# Patient Record
Sex: Male | Born: 1935 | Race: White | Hispanic: No | Smoking: Former smoker
Health system: Southern US, Community
[De-identification: ages and names within clinical notes are randomized; demographics above are authoritative.]

## PROBLEM LIST (undated history)

## (undated) DIAGNOSIS — I714 Abdominal aortic aneurysm, without rupture, unspecified: Secondary | ICD-10-CM

## (undated) DIAGNOSIS — E039 Hypothyroidism, unspecified: Secondary | ICD-10-CM

## (undated) DIAGNOSIS — G4730 Sleep apnea, unspecified: Secondary | ICD-10-CM

## (undated) DIAGNOSIS — J449 Chronic obstructive pulmonary disease, unspecified: Secondary | ICD-10-CM

## (undated) DIAGNOSIS — I10 Essential (primary) hypertension: Secondary | ICD-10-CM

## (undated) DIAGNOSIS — I639 Cerebral infarction, unspecified: Secondary | ICD-10-CM

## (undated) DIAGNOSIS — R51 Headache: Secondary | ICD-10-CM

## (undated) DIAGNOSIS — R0602 Shortness of breath: Secondary | ICD-10-CM

## (undated) DIAGNOSIS — R519 Headache, unspecified: Secondary | ICD-10-CM

## (undated) DIAGNOSIS — I2510 Atherosclerotic heart disease of native coronary artery without angina pectoris: Secondary | ICD-10-CM

## (undated) DIAGNOSIS — Z950 Presence of cardiac pacemaker: Secondary | ICD-10-CM

## (undated) DIAGNOSIS — M1990 Unspecified osteoarthritis, unspecified site: Secondary | ICD-10-CM

## (undated) DIAGNOSIS — N189 Chronic kidney disease, unspecified: Secondary | ICD-10-CM

## (undated) DIAGNOSIS — I219 Acute myocardial infarction, unspecified: Secondary | ICD-10-CM

## (undated) HISTORY — DX: Cerebral infarction, unspecified: I63.9

## (undated) HISTORY — DX: Abdominal aortic aneurysm, without rupture, unspecified: I71.40

## (undated) HISTORY — DX: Chronic obstructive pulmonary disease, unspecified: J44.9

## (undated) HISTORY — DX: Abdominal aortic aneurysm, without rupture: I71.4

## (undated) HISTORY — PX: CATARACT EXTRACTION: SUR2

---

## 1961-12-21 HISTORY — PX: APPENDECTOMY: SHX54

## 2003-10-22 ENCOUNTER — Encounter: Admission: RE | Admit: 2003-10-22 | Discharge: 2003-10-22 | Payer: Self-pay | Admitting: *Deleted

## 2005-09-05 ENCOUNTER — Encounter: Admission: RE | Admit: 2005-09-05 | Discharge: 2005-09-05 | Payer: Self-pay | Admitting: Orthopedic Surgery

## 2005-12-21 HISTORY — PX: CORONARY ARTERY BYPASS GRAFT: SHX141

## 2006-03-09 ENCOUNTER — Ambulatory Visit (HOSPITAL_COMMUNITY): Admission: RE | Admit: 2006-03-09 | Discharge: 2006-03-09 | Payer: Self-pay | Admitting: *Deleted

## 2006-10-21 ENCOUNTER — Inpatient Hospital Stay (HOSPITAL_COMMUNITY): Admission: AD | Admit: 2006-10-21 | Discharge: 2006-10-31 | Payer: Self-pay | Admitting: Cardiology

## 2006-10-21 ENCOUNTER — Encounter: Payer: Self-pay | Admitting: Emergency Medicine

## 2006-10-21 ENCOUNTER — Ambulatory Visit: Payer: Self-pay | Admitting: Cardiology

## 2006-11-05 ENCOUNTER — Encounter: Admission: RE | Admit: 2006-11-05 | Discharge: 2006-11-05 | Payer: Self-pay | Admitting: Cardiothoracic Surgery

## 2006-11-24 ENCOUNTER — Ambulatory Visit: Payer: Self-pay | Admitting: Cardiology

## 2006-11-25 ENCOUNTER — Encounter: Payer: Self-pay | Admitting: Cardiology

## 2006-11-25 ENCOUNTER — Ambulatory Visit: Payer: Self-pay

## 2006-12-03 ENCOUNTER — Ambulatory Visit: Payer: Self-pay | Admitting: Cardiology

## 2006-12-09 ENCOUNTER — Encounter (HOSPITAL_COMMUNITY): Admission: RE | Admit: 2006-12-09 | Discharge: 2007-03-09 | Payer: Self-pay | Admitting: Cardiology

## 2006-12-10 ENCOUNTER — Ambulatory Visit: Payer: Self-pay | Admitting: Cardiology

## 2007-01-05 ENCOUNTER — Ambulatory Visit: Payer: Self-pay | Admitting: Cardiology

## 2007-01-05 LAB — CONVERTED CEMR LAB
ALT: 18 units/L (ref 0–40)
AST: 23 units/L (ref 0–37)
Albumin: 3.7 g/dL (ref 3.5–5.2)
Alkaline Phosphatase: 37 units/L — ABNORMAL LOW (ref 39–117)
BUN: 29 mg/dL — ABNORMAL HIGH (ref 6–23)
CO2: 29 meq/L (ref 19–32)
Calcium: 9.4 mg/dL (ref 8.4–10.5)
Chloride: 105 meq/L (ref 96–112)
Creatinine, Ser: 2.2 mg/dL — ABNORMAL HIGH (ref 0.4–1.5)
GFR calc Af Amer: 38 mL/min
GFR calc non Af Amer: 32 mL/min
Glucose, Bld: 89 mg/dL (ref 70–99)
Potassium: 4.7 meq/L (ref 3.5–5.1)
Sodium: 141 meq/L (ref 135–145)
Total Bilirubin: 0.8 mg/dL (ref 0.3–1.2)
Total Protein: 6.2 g/dL (ref 6.0–8.3)

## 2007-01-27 ENCOUNTER — Ambulatory Visit: Payer: Self-pay

## 2007-02-01 ENCOUNTER — Encounter: Admission: RE | Admit: 2007-02-01 | Discharge: 2007-02-01 | Payer: Self-pay | Admitting: Nephrology

## 2007-02-18 ENCOUNTER — Ambulatory Visit: Payer: Self-pay | Admitting: Cardiology

## 2007-02-18 LAB — CONVERTED CEMR LAB
BUN: 21 mg/dL (ref 6–23)
CO2: 29 meq/L (ref 19–32)
Calcium: 9.4 mg/dL (ref 8.4–10.5)
Chloride: 103 meq/L (ref 96–112)
Creatinine, Ser: 1.7 mg/dL — ABNORMAL HIGH (ref 0.4–1.5)
GFR calc Af Amer: 51 mL/min
GFR calc non Af Amer: 43 mL/min
Glucose, Bld: 94 mg/dL (ref 70–99)
Potassium: 4.4 meq/L (ref 3.5–5.1)
Sodium: 137 meq/L (ref 135–145)
TSH: 5.23 microintl units/mL (ref 0.35–5.50)

## 2007-02-23 ENCOUNTER — Ambulatory Visit: Payer: Self-pay

## 2007-02-23 LAB — CONVERTED CEMR LAB
BUN: 22 mg/dL (ref 6–23)
CO2: 30 meq/L (ref 19–32)
Calcium: 9.1 mg/dL (ref 8.4–10.5)
Chloride: 106 meq/L (ref 96–112)
Creatinine, Ser: 1.8 mg/dL — ABNORMAL HIGH (ref 0.4–1.5)
GFR calc Af Amer: 48 mL/min
GFR calc non Af Amer: 40 mL/min
Glucose, Bld: 90 mg/dL (ref 70–99)
Potassium: 4.3 meq/L (ref 3.5–5.1)
Sodium: 142 meq/L (ref 135–145)

## 2007-03-08 ENCOUNTER — Ambulatory Visit: Payer: Self-pay | Admitting: Cardiology

## 2007-07-11 ENCOUNTER — Encounter: Admission: RE | Admit: 2007-07-11 | Discharge: 2007-07-11 | Payer: Self-pay | Admitting: Endocrinology

## 2007-08-24 ENCOUNTER — Ambulatory Visit: Payer: Self-pay | Admitting: Cardiology

## 2007-09-20 ENCOUNTER — Ambulatory Visit: Payer: Self-pay | Admitting: Cardiology

## 2008-04-30 ENCOUNTER — Ambulatory Visit: Payer: Self-pay | Admitting: Cardiology

## 2011-05-05 NOTE — Assessment & Plan Note (Signed)
[facility]                            CARDIOLOGY OFFICE NOTE   JADEIN, THALMAN                       MRN:          [id number]  DATE:04/30/2008                            DOB:          [birth year]    Mr. Cicio is in for followup.  Generally, he is quite stable.  He has  not been having any ongoing chest pain or shortness of breath.  He feels  well.  He has exercised on a regular basis, although he has had problem  with his knee from over-exercising.  He is followed up with Dr. Wilson Singer,  and Dr. Wilson Singer has been checking his laboratory studies.  He is also  seeing Dr. Janice Norrie and has previously seen Dr. Florene Glen for renal  insufficiency.  Importantly, associated was hypertension, he has had a  renal ultrasound done here, and that demonstrated no evidence of  significant renal artery stenosis.   CURRENT MEDICATIONS:  1. Include aspirin 81 mg daily.  2. Toprol XL 50 mg 1/2 tablet daily.  3. Tricor 40 mg daily.  4. Synthroid 75 mg daily.  5. Hytrin 10 mg twice daily.  6. Crestor 20 mg daily.  7. Exforge 5/160 one daily.  8. B12 1000 mg monthly.  9. Omacor 4 daily.   PHYSICAL:  He is alert and oriented.  No distress.  Blood pressure is 140/90, pulse 49.  Lung fields clear.  CARDIAC:  Rhythm is regular.   EKG reveals marked sinus bradycardia with borderline first-degree AV  block, PR intervals 234 milliseconds.   Overall, the patient remained stable.  He has moderate hypertension that  is of borderline control, although this would be improved pretty  substantially with a significant amount of weight loss.  He has  hypercholesterolemia, and has known renal insufficiency Dr. Wilson Singer is  following both of these issues.   With regard to the stability, because he is doing well without chest  pain or significant problems, we will plan to see him back again in  followup in about 2 years.  Should any problems in the interim he is to  contact us.  I think he  can have a followup EKG done at Dr. Eugenio Hoes  office in 1 year to check on his PR interval.  His beta blockers could  be dropped although he is on very  low-dose beta blockade, and his blood pressure is borderline.  Should  the patient have problems in the interim, he is to call us.     Loretha Brasil. Lia Foyer, MD, [facility]  Electronically Signed    TDS/MedQ  DD: 04/30/2008  DT: 04/30/2008  Job #: [id number]

## 2011-05-05 NOTE — Procedures (Signed)
[facility]                              EXERCISE TREADMILL   NAME:[name], Derek A                       MRN:          [id number]  DATE:09/20/2007                            DOB:          1935/07/03    PROCEDURE:  Exercise tolerance test.   Duration of exercise 4 minutes 23 seconds.  Maximum heart rate 129.  Percent of MHR is 86%.   COMMENTS:  [name] exercise today on the Bruce protocol.  Exercise  tolerance was fairly limited.  His exercise capacity was not as good as  it potentially should be at this point.  He had a modest heart rate rise  with occasional premature ventricular contractions.  Resting  electrocardiogram demonstrates normal sinus rhythm without ST segment  changes and at maximum stress, there was no significant ST depression.   RISKS:  The patient is status post revascularization surgery from  multivessel disease.  The patient underwent grafting x5 with internal  mammary to the LAD, saphenous vein graft to the third diagonals,  sequential saphenous vein graft to the OM-1 and left posterior  descending.  He has done clinically well.  He does have mild renal  insufficiency which is being followed by the nephrologists.  He is also  being followed by Dr. Wilson Singer and Dr. Wilson Singer is managing his lipids.  I  plan to have the patient return to the cardiology clinic in 6 months.  I  have prodded him strongly to do more exercises.  He has been fairly  limited in this recently.     Loretha Brasil. Lia Foyer, MD, [facility]  Electronically Signed    TDS/MedQ  DD: 09/20/2007  DT: 09/20/2007  Job #: [id number]

## 2011-05-05 NOTE — Letter (Signed)
August 24, 2007    Ronaldo Miyamoto, M.D.  [place],  [place] [address]   RE:  Derek [name], Derek [name]  MRN:  [id number]  /  DOB:  October 13, 1935   Dear Simona Huh:   I had the pleasure of seeing your nice patient, Masood Stclair, in the  office today at a followup visit. Clinically, he is doing reasonably  well. He has not had any major problems. He has slowed down his exercise  recently because of some ongoing problems with humidity, but in general,  he has done really quite well.   Today, on examination, his weight is 219 pounds, blood pressure 122/68,  pulse 60. The lung fields are clear. Cardiac rhythm is regular.   His electrocardiogram demonstrates sinus bradycardia with first-degree  AV block and possible old inferior wall infarction.   I have taken the liberty of scheduling him for an exercise tolerance  test. We will see him back and perform this. Hopefully, he will do well.  I do not anticipate any problems.  I have recommended that he continue  to stay active. I know that you are managing his lipids and also his  hypertension, and it seems improved. He is also being followed by  Nephrology for mild renal insufficiency. Thanks for allowing me to share  in his care.    Sincerely,     Loretha Brasil. Lia Foyer, MD, [facility]  Electronically Signed   TDS/MedQ  DD: 08/24/2007  DT: 08/24/2007  Job #: [id number]

## 2011-05-08 NOTE — Op Note (Signed)
NAME:  ROSSER, SHOOK NO.:  [id number]   MEDICAL RECORD NO.:  [id number]          PATIENT TYPE:  INP   LOCATION:  2302                         FACILITY:  [facility]   PHYSICIAN:  Revonda Standard. Roxan Hockey, M.D.DATE OF BIRTH:  10/28/1935   DATE OF PROCEDURE:  10/26/2006  DATE OF DISCHARGE:                                 OPERATIVE REPORT   PREOPERATIVE DIAGNOSIS:  Two-vessel coronary disease with unstable angina.   POSTOPERATIVE DIAGNOSIS:  Two-vessel coronary disease with unstable angina.   PROCEDURE:  Median sternotomy, extracorporeal circulation, coronary bypass  grafting x5 (left internal mammary artery to left anterior descending,  saphenous vein graft to third diagonal, sequential saphenous vein graft  obtuse marginal 1, 3, and left posterior descending) endoscopic vein harvest  right leg.   SURGEON:  Revonda Standard. Roxan Hockey, M.D.   FIRST ASSISTANT:  Ivin Poot, M.D.   SECOND ASSISTANT:  Suzzanne Cloud, P.A.   ANESTHESIA:  General.   FINDINGS:  Posterior descending 1 mm poor quality target.  Remaining targets  good-quality, good-quality conduits.   CLINICAL NOTE:  Mr. Samaniego is a 76 year old gentleman who presented with new-  onset unstable angina.  At catheterization he had severe two-vessel disease  in the left dominant circulation, and was referred for coronary artery  bypass grafting.  The indications, risks, benefits and alternatives were  discussed in detail with the patient and he understood and accepted the  risks and agreed to proceed.   OPERATIVE NOTE:  Mr. Winona was brought the preop holding area on  10/26/2006.  There the anesthesia service placed lines for monitoring  arterial, central venous and pulmonary arterial pressure.  ECG leads were  placed for continuous telemetry.  Intravenous antibiotics were administered.  The patient was taken to the operating room, anesthetized and intubated.  A  Foley catheter was placed.  The chest, abdomen  and legs were prepped and  draped in usual fashion.  Incision in the medial aspect the right leg at the  level of the knee.  The greater saphenous vein was identified and was  harvested from the right groin to the mid calf endoscopically.  It was of  good quality.  Simultaneously, a median sternotomy was performed and the  left internal mammary artery was harvested using standard technique.  Mammary harvest was the difficult superiorly as there was significant  scarring in that area but the mammary was a good-quality vessel with  excellent flow.  Five thousand units of heparin was administered during the  vessel harvest, remaining full heparin dose was given prior to opening the  pericardium.   The pericardium was opened.  The ascending aorta was inspected and  cannulated via concentric 2-0 Ethibond pledgeted pursestring sutures.  A  dual stage venous cannula was placed via pursestring suture in the right  atrial appendage.  After confirming adequate anticoagulation with ACT  measurement, cardiopulmonary bypass instituted.  The patient was cooled to  32 degrees Celsius.  Flows were maintained per protocol throughout the  procedure.   The coronary arteries were inspected and anastomotic sites were chosen.  The  conduits were inspected and cut to length.  A foam pad was placed in the  pericardium to protect the left phrenic nerve.  The temperature probe was  placed in the myocardial septum.  A cardioplegia cannula was placed in the  ascending aorta.   The aorta was crossclamped, left ventricle was emptied via the aortic root  vent.  Cardiac arrest was achieved with a combination of cold antegrade  blood cardioplegia and topical iced saline.  After achieving complete  diastolic arrest and adequate myocardial septal cooling, the following  distal anastomoses were performed.   First a reverse saphenous vein graft was placed end-to-side to the third  diagonal branch of the LAD.  This was a  1.3-mm thin-walled but good-quality  vessel.  The vein graft was relatively smaller caliber but was of  satisfactory quality.  The anastomosis was performed with a running 7-0  Prolene suture.  All anastomoses were probed proximally and distally at  their completion to ensure patency and cardioplegia was administered after  the completion of each vein graft.   Next, a reverse saphenous vein graft was placed sequentially to obtuse  marginal 1, 3 and the posterior descending branch from the left circumflex.  OM1 was a 1.5-mm good-quality target.  OM 3 was a 2-mm good-quality target.  The left posterior descending was 1-mm diffusely diseased poor quality  target.  The anastomoses to OM1 and 3 were side-to-side and end-to-side  anastomosis was performed to the posterior descending, all with running 7-0  Prolene sutures.  There was good flow through the graft.  Cardioplegia was  administered.  There was good hemostasis.   The left internal mammary artery was brought through a window in the  pericardium.  The distal end was beveled was anastomosed end-to-side to the  LAD.  The LAD was a 1.5-mm good-quality target.  The mammary was 2 mm good-  quality vessel at the site of the anastomosis.  The anastomosis was  performed with a running 8-0 Prolene suture at completion the mammary to LAD  anastomosis, the  bulldog clamps briefly removed to inspect for hemostasis.  Immediate rapid septal rewarming was noted.  The bulldog clamp was placed  and the mammary pedicle was tacked to the epicardial surface of the heart.   The additional cardioplegia was administered, the cardioplegic cannula  removed from the ascending aorta.  The vein grafts were cut to length.  The  proximal vein graft anastomoses were performed to 4.5 mm punch aortotomies  with running 6-0 Prolene sutures.  At completion of the final proximal  anastomosis, the patient was placed in Trendelenburg position.  Lidocaine was administered.   The bulldog clamps removed from the left mammary artery.  The aortic root was de-aired.  The aortic crossclamp was removed.  Total  crossclamp time was 66 minutes.   While the patient was being rewarmed, all proximal and distal anastomoses  were inspect for hemostasis.  Epicardial pacing wires were placed on the  right ventricle and right atrium.  The patient spontaneously resumed a sinus  bradycardia rhythm and did not require defibrillation.  Epicardial pacing  wires were placed on the right ventricle and right atrium and the patient  had rewarmed to a core temperature of 37 degrees Celsius, DDD pacing was  initiated.  The patient was weaned from cardiopulmonary bypass without  inotropic support on the first attempt and without difficulty.  The total  bypass time was 112 minutes.   The test dose of protamine  was administered as well tolerated.  The atrial  and aortic cannulae were removed.  The remaining protamine was administered  without incident.  The chest was irrigated with 1 liter of normal saline  containing 1 gram of vancomycin.  Hemostasis was achieved.  A left pleural  and two mediastinal chest tubes were placed through separate subcostal  incisions.  The pericardium was reapproximated with interrupted 3-0 silk  sutures.  It came together easily without tension.  The sternum was closed  with combination of single and double heavy gauge stainless steel wires.  The pectoralis fascia was closed with running #1 Vicryl suture.  The  remaining incisions  closed in standard fashion.  Subcuticular closure used for the skin.  All  sponge, needle and instrument counts were correct the end of the procedure.  The patient was taken from the operating room to the postanesthetic care  unit in critical but stable condition.           ______________________________  Revonda Standard Roxan Hockey, M.D.     SCH/MEDQ  D:  10/26/2006  T:  10/27/2006  Job:  [id number]   cc:   Loretha Brasil. Lia Foyer, MD,  Merilyn Baba, M.D.

## 2011-05-08 NOTE — Letter (Signed)
March 08, 2007    Ronaldo Miyamoto, M.D.  [place], [place] [address]   RE:  Derek [name], Derek [name]  MRN:  [id number]  /  DOB:  12/30/1934   Dear Simona Huh:   It was a pleasure to speak with you regarding your nice patient, Derek [name].  As we discussed, I have encouraged him to follow up with you  with regard to both management of hypertension and hyperlipidemia, and  observation of his overall renal function. We have sent you a copy of  the report from his ultrasound.  Fortunately, it suggests that bilateral  renal flow is preserved  He seems to be doing extremely well, although  he does get a little bit hypertensive, particularly on the exercise bike  at the hospital.  His trip to cardiac rehab will end tomorrow.   CURRENT MEDICATIONS:  1. Aspirin 81 mg daily.  2. Toprol XL 50 mg 1/2 tablet daily.  3. Altace 10 mg 1 daily.  4. TriCor 48 mg daily.  5. Hytrin 10 mg daily.  6. Synthroid 75 mcg daily.   PHYSICAL EXAMINATION:  GENERAL:  He is an alert and oriented gentleman.  VITAL SIGNS: Today the blood pressure is 128/70, pulse 65.  His weight  is down to 230 pounds.  Compared to December, this is down about 8  pounds.  LUNGS:  The lung fields are clear.  CARDIAC:  The cardiac rhythm is regular.   Overall, we are quite pleased with his progress.  We will not see him  for six months.  At that time, I will do an exercise treadmill.  I have  asked him to continue to follow up with you with regard to his  hyperlipidemia, his hypertension, and his overall general medical  status.   Thanks again for allowing [place] to share in his care.  Best regards,    Sincerely,      Loretha Brasil. Lia Foyer, MD, [facility]  Electronically Signed    TDS/MedQ  DD: 03/08/2007  DT: 03/08/2007  Job #: [id number]

## 2011-05-08 NOTE — Discharge Summary (Signed)
NAME:  Derek [name], Derek [name] NO.:  [id number]   MEDICAL RECORD NO.:  [id number]          PATIENT TYPE:  INP   LOCATION:  2035                         FACILITY:  [facility]   PHYSICIAN:  Revonda Standard. Roxan Hockey, M.D.DATE OF BIRTH:  December 03, 1935   DATE OF ADMISSION:  10/21/2006  DATE OF DISCHARGE:  10/31/2006                                 DISCHARGE SUMMARY   ADMISSION DIAGNOSIS:  Unstable angina.   DISCHARGE/SECONDARY DIAGNOSES:  1. Status post small subendocardial myocardial infarction with 2-vessel      coronary artery disease status post CABG.  2. Hypertension.  3. Hyperlipidemia.  4. Hypertriglyceridemia.  5. Hypothyroidism.  6. Herpes zoster in 2000.  7. Questionable for benign prostatic hyperplasia.  8. Seasonal allergies.  9. Osteoarthritis.  10.History of tobacco use, a 40-pack history, quit in 2002.  [place].  12.Renal insufficiency.   PROCEDURES:  1. Cardiac catheterization by Dr. Sherren Mocha on 10/22/2006, showing      severe 2-vessel coronary artery disease with mildly increased left      ventricular filling pressures.  2. 10/26/2006 - Coronary bypass grafting x5 using the left internal      mammary to the left anterior descending, saphenous vein graft to the      third diagonal, sequential saphenous vein to the obtuse marginal, 1, 3,      and left posterior descending.  3. Endoscopic vein harvesting from the right leg, surgeon Dr. Modesto Charon.   BRIEF HISTORY:  Derek [name] is a 76 year old Caucasian male with no prior  history of known coronary artery disease.  Approximately 2 weeks ago he was  walking with his granddaughter in [place] and developed 6-7/10 right chest  heaviness with radiation to the right arm and right temple associated with  shortness of breath and diaphoresis.  This lasted approximately 15 minutes  and was relieved with rest.  He then had approximately 3-4 more additional  episodes of exertional chest  discomfort relieved with rest.  On the morning  of November 1st while showering he had an episode that was relieved with  rest after about 15 minutes, upon the attempt to present to the [facility] emergency department.  Cardiac markers were initially negative x1.  EKG was without acute changes.  He was placed on IV Heparin and  nitroglycerin and evaluated by his primary cardiologist Dr. Bing Quarry.   HOSPITAL COURSE:  Derek [name] was admitted to [facility] October 22, 2006, with unstable angina.  He was placed on heparin and also then  underwent cardiac catheterization with results as discussed above.  Again  his cardiac enzymes were negative at first, but ultimately he did have a  very slight increase in his CK-MB at 4.9, with a relative index at 2.6, and  a troponin I at 0.07.  Due to the finding of severe 2-vessel coronary artery  disease via cardiac catheterization, a cardiac surgery consultation was  requested and he was evaluated by Dr. Modesto Charon.  At this time the  patient was free of chest pain.  After he examined the patient and reviewed  the patients heart catheterization films, Dr. Roxan Hockey felt that the  patient's best treatment option was for him to undergo coronary artery  bypass graft surgery.  After discussing risks, benefits, and alternatives,  the patient wished to proceed.  Surgery was scheduled for November 6th.   HIS POSTOPERATIVE COURSE:  At the time of this dictation his postoperative  course has been relatively uneventful.  He was monitored for a renal  insufficiency with his admission creatinine of 1.6.  His creatinine peaked  at 1.8, but has been stable for 48 hours at 1.6.  He did require short-term  diuretic therapy for mild postoperative fluid volume excess.  He is also  treated for postoperative atrial fibrillation, which was quickly resolved  with IV amiodarone.  This was changed to oral amiodarone prior to discharge.  On  postoperative day 3, Derek [name] was transferred out of the surgical  intensive care unit.  At this time his chest tubes and basic monitoring  lines were discontinued.  He was on the IV pressors.  He had been extubated  without difficulty postoperatively, and he was actually extubated on the  evening of the surgery.  By transfer Derek [name] had been weaned from  supplemental oxygen.  He was afebrile.  He was maintaining sinus rhythm with  a rate of between 70s and 80s.  He did have intermittent elevation in his  blood pressure up to about 150/80.  His ACE inhibitor therapy was resumed  and beta-blocker therapy was adjusted.  On postoperative day 4, Derek [name]  was felt nearly ready for discharge.  Again, his followup chest x-ray showed  no pneumothorax with mild bibasilar atelectasis and small bilateral pleural  infusion.   Exam showed a heart with a regular rate and rhythm.  Lung sounds were clear.  Abdominal exam was benign.  His extremities showed no significant edema, and his incisions were healing  well without signs of infection.  His bowel and bladder were functioning appropriately.  His pain was controlled on all medications.  He was ambulating independently.   His most recent labs showed a white blood count of 6.3, hemoglobin 10.5,  hematocrit 30.5, platelet count 160, sodium 142, potassium 4.2, chloride  109, CO2 is 26, BUN 22, creatinine stable at 1.6, and blood glucose 105.  If  Derek [name] maintains sinus rhythm and there are no significant changes in  status, we anticipate he will be ready for discharge home on postoperative  day 5, October 31, 2006.   DISCHARGE MEDICATIONS:  1. Coated aspirin 325 mg p.o. daily.  2. Lopressor 25 mg p.o. b.i.d.  3. Altace 5 mg p.o. daily.  4. Crestor 10 mg p.o. daily.  5. Omacor 4 g p.o. daily.  6. Terazosin 2 mg p.o. daily.  7. Tricor 40 mg p.o. daily.  8. Clarinex 5 mg p.o. daily. 9. Synthroid 75 mcg p.o. daily.  10.Amiodarone 200  mg 2 tablets p.o. b.i.d. x10 days, then decrease to 1      tablet p.o. daily.  11.Lasix 40 mg p.o. daily x3 more days.  12.K-Dur 20 mEq p.o. daily x3 more days.  13.Oxycodone 5 mg 1-2 tablets p.o. q.4 hours p.r.n. pain.   DISCHARGE INSTRUCTIONS:  His instructed to avoid driving or heavy lifting,  more than 10 pounds.  He is encouraged to continue daily walking and  breathing exercises.  He is to call if he develops fever greater than 101,  or redness  or drainage from his incision sites.  He is to follow a low-fat,  low-salt diet.  He may shower and clean his incision gently with soap and  water.   FOLLOWUP:  1. He is to follow up with Dr. Roxan Hockey at the [place] office in      approximately 3 weeks.  Our office will contact him regarding his      specific appointment date and time.  2. He should call and schedule a 2-week followup with Dr. Bing Quarry,      and should have a chest x-ray as well at this appointment.      Jacinta Shoe, P.A.    ______________________________  Revonda Standard Roxan Hockey, M.D.    AWZ/MEDQ  D:  10/30/2006  T:  10/31/2006  Job:  [id number]   cc:   Revonda Standard. Roxan Hockey, M.D.  Loretha Brasil. Lia Foyer, MD, Merilyn Baba, M.D.

## 2011-05-08 NOTE — Assessment & Plan Note (Signed)
[place]                            CARDIOLOGY OFFICE NOTE   JOSE, PACEY                       MRN:          [id number]  DATE:11/24/2006                            DOB:          02/03/1935    CARDIOLOGIST:  Loretha Brasil. Lia Foyer, MD, [facility]   PRIMARY CARE PHYSICIAN:  Ronaldo Miyamoto, M.D.   HISTORY OF PRESENT ILLNESS:  Mr. Cifuentes is a 76 year old male patient  with no previously known coronary disease who developed unstable angina  pectoris and presented to [facility] on October 21, 2006. He  had a mild bump in his cardiac enzymes. His troponin peaked at 0.07. He  was taken for cardiac catheterization that revealed 2-vessel CAD. He did  not have a left ventriculogram secondary to renal insufficiency. He was  further recommended for coronary artery bypass grafting. His  postoperative course was fairly uneventful. He did develop postoperative  atrial fibrillation. He was placed on IV amiodarone and he converted to  sinus rhythm. He has been kept on p.o. amiodarone. He returns to the  office today for post-bypass followup. He notes that he is doing well  without any anginal symptoms. He currently has some musculoskeletal  chest pain with certain types of movements. He denies any significant  shortness of breath with exertion. Denies any fevers, chills or cough.  He does feel sluggish sometimes. He denies any syncope or pre-syncope.   CURRENT MEDICATIONS:  1. Aspirin 325 mg daily.  2. Lopressor 25 mg twice a day.  3. Altace 5 mg a day.  4. Crestor 10 mg nightly.  5. Omacor 4 g daily.  6. Terazosin.  7. Tricor.  8. Clarinex.  9. Synthroid 75 mcg daily.  10.Amiodarone 200 mg twice a day.  11.Verapamil 240 mg a half tablet daily.  12.Metronidazole.  13.Mucinex.  14.Multivitamin.  15.B12.   PHYSICAL EXAMINATION:  He is a well-nourished, well-developed male in no  distress. Blood pressure is 136/68, pulse is 69, weight is 238 pounds.  HEENT: Is unremarkable.  NECK: Without JVD.  CARDIAC: S1, S2. Regular rate and rhythm.  LUNGS:  Clear to auscultation bilaterally without wheezing, rhonchi or  rales.  ABDOMEN: Soft and nontender.  EXTREMITIES: Without edema. Right groin without hematomas or bruits.  Median sternotomy wound is well healing. No drainage.   Chest x-ray reveals cardiomegaly, status post CABG, question small left  effusion, final report pending.   ELECTROCARDIOGRAM: Initial ECG had a lot of noise present and was  somewhat concerning for atrial fibrillation. Repeat EKG though confirms  normal sinus rhythm with a heart rate of 50, normal axis, inferolateral  T-wave changes, first-degree AV block with a PR interval of 222  milliseconds.   IMPRESSION:  1. Coronary artery disease.      a.     Status post small non ST elevation myocardial infarction.      b.     Status post coronary artery bypass graft x5: Left internal       mammary artery to left anterior descending artery; saphenous vein       graft  to the third diagonal; saphenous vein graft to the obtuse       margin 1 and 3.  2. Hypertension.  3. Hyperlipidemia, treated.  4. Treated hypothyroidism.  5. Benign prostatic hypertrophy.  6. Osteoarthritis.  7. Seasonal allergies.  8. Past history of tobacco abuse.  9. Chronic renal insufficiency.  10.Sinus bradycardia.   PLAN:  The patient presents now in the office today for postoperative  followup. He is doing well. He does have some sinus bradycardia and  occasionally feels sluggish. He denies any syncope. I have recommended  that he change his metoprolol to Toprol XL 25 mg a day. He can also  decrease his amiodarone to 200 mg once a day. He will followup with Dr.  Lia Foyer in 6 weeks. As long as he remains in sinus rhythm, he can likely  come off of the amiodarone at that time. He did not have his left  ventricular function assessed when he was in the hospital. Will set him  up  for an  echocardiogram. Check a BMET.  He will follow up with Dr.  Roxan Hockey later this month and Dr. Lia Foyer in 6 weeks.      Richardson Dopp, PA-C  Electronically Signed      Loretha Brasil. Lia Foyer, MD, [facility]  Electronically Signed   SW/MedQ  DD: 11/24/2006  DT: 11/24/2006  Job #: [id number]   cc:   Ronaldo Miyamoto, M.D.  Revonda Standard Roxan Hockey, M.D.

## 2011-05-08 NOTE — Cardiovascular Report (Signed)
NAME:  Derek [name], Derek [name] NO.:  [id number]   MEDICAL RECORD NO.:  [id number]          PATIENT TYPE:  INP   LOCATION:  [id number]                         FACILITY:  [facility]   PHYSICIAN:  Juanda Bond. Burt Knack, MD  DATE OF BIRTH:  [birth year]   DATE OF PROCEDURE:  DATE OF DISCHARGE:                              CARDIAC CATHETERIZATION   PROCEDURE:  Left heart catheterization selective coronary angiography.   INDICATION:  Derek [name] is a very nice 76 year old gentleman, who presented  with typical angina and a small troponin increase consistent with a small  subendocardial MI.  In the setting of his typical history and abnormal  cardiac biomarkers, he was referred for cardiac catheterization.   PROCEDURAL DETAILS:  Risks and indications of the procedure were explained  in detail to the patient.  Informed consent was obtained.  The right groin  was prepped, draped, and anesthetized with 1% lidocaine.  The right femoral  artery was accessed using a modified Seldinger technique with a 6-French  arterial sheath.  Multiple angiograph views were taken of both the left and  right coronary arteries.  For the left coronary artery a JL4 catheter was  used.  This continued to subselect the left circumflex and I tried a JL3/5  catheter to better image the LAD.  I then used a 3.0 mm guiding catheter to  ultimately image the LAD more selectively.  For the right coronary artery a  single image was taken as it was a very small vessel.  A JR3 catheter was  used for that.  At the conclusion of the case the patient was transferred to  the holding area where the sheath will be pulled and manual pressure used  for hemostasis.  All catheter exchanges were performed over a guide wire.   FINDINGS:  Aortic pressure 170/87 with a mean of 120, LV pressure 169/9 with  an end diastolic pressure of 17.   Left main stem is severely calcified.  There is no significant obstructive  disease.  It bifurcates into  the LAD and left circumflex.   The LAD is a large diameter vessel that courses down to the left ventricular  apex.  It gives off 3 medium caliber diagonal branches.  The LAD is severely  calcified throughout its proximal and mid portions.  There are multiple  areas of severe stenosis.  The proximal LAD has a discrete 95% stenosis, the  mid LAD after the first septal perforator has a 90% stenosis and then  farther down in the mid LAD at the area of the second diagonal there is a  75% stenosis.  The distal LAD is free of any significant angiographic  disease.   The left circumflex system is calcified and its proximal portions.  There is  a 70% proximal left circumflex lesion.  There is a first obtuse marginal  that arises from this area and has mild osteal disease.  The second obtuse  marginal is a large caliber vessel that gives off 2 branches.  The lower  branch has a 75-80% stenosis in its proximal portion.  The  left circumflex  courses down the AV groove and gives off posterolateral branch and a left  PDA.  The left PDA has a 50% stenosis and is relatively small diameter.   The right coronary artery is small and nondominant.  There is no significant  angiographic disease.   ASSESSMENT:  1. Severe two-vessel coronary artery disease in a patient with a dominate      circumflex.  2. Mildly increased left ventricular filling pressures.   PLAN:  In the setting of Derek [name]'s subendocardial myocardial infarction  and the fact that he has a diffusely calcified severely diseased LAD and  multiple segments with moderately severe disease in his left circumflex.  Because of that I would recommend a CVTS consult for coronary bypass surgery  consideration.  I think with his multiple segmental disease and severe  calcification he would be very difficult to perform percutaneous  intervention on.      Juanda Bond. Burt Knack, MD  Electronically Signed     MDC/MEDQ  D:  10/22/2006  T:   10/23/2006  Job:  [id number]

## 2011-05-08 NOTE — Consult Note (Signed)
NAME:  REAVIS, TEWS NO.:  [id number]   MEDICAL RECORD NO.:  [id number]          PATIENT TYPE:  INP   LOCATION:  [id number]                         FACILITY:  [place]   PHYSICIAN:  Revonda Standard. Roxan Hockey, M.D.DATE OF BIRTH:  1935/09/13   DATE OF CONSULTATION:  10/22/2006  DATE OF DISCHARGE:                                   CONSULTATION   REASON FOR CONSULTATION:  Severe two-vessel coronary disease.   HISTORY OF PRESENT ILLNESS:  Mr. Umansky is a 76 year old gentleman who has  no prior history of coronary disease.  He was in [place] visiting his  granddaughter about 2 weeks ago.  He was walking in the mall when he  developed a 6-7/10 discomfort in his right chest.  This radiated to his  right arm.  His right arm felt very heavy and sore, ultimately radiated to  his right temple.  It was associated with shortness of breath and  diaphoresis.  He sat down and rested about 15 minutes, and the pain  resolved.  He then walked further without any additional pain.  He had  another episode while in [place]. at the [employer].  He was  concerned about these episodes but then yesterday had an episode where he  was washing his hair in the shower and developed severe discomfort similar  in nature and had to sit down and rest and ultimately it resolved.  He went  to the [facility] emergency room and then was subsequently transferred to  [facility].  His initial cardiac enzymes were negative.  He ultimately did  have a very slight increase in his CK-MB of 4.9 with a relative index of 2.6  and a troponin-I of 0.07.  Today, he had cardiac catheterization by Dr.  Sherren Mocha which showed a heavily calcified complex proximal LAD lesion  about 95%.  He had further stenosis in the LAD at the takeoff of the second  diagonal.  He also had significant disease in the circumflex with a 70%  proximal stenosis and 75% stenosis in OM 2 and a 50% stenosis in his left-  sided  posterior descending.  He had a small nondominant right coronary.  He  had slightly elevated left ventricular filling pressures.  Left  ventriculogram was not done due to his baseline mild renal insufficiency.  Mr. Marmolejos currently is pain free.   PAST MEDICAL HISTORY:  1. Hypertension.  2. Hyperlipidemia.  3. Hypothyroidism.  4. Question of benign prostatic hypertrophy.  5. Does have osteoarthritis particularly in his knees and ankles.  6. Questionable neuropathy.  7. Remote tobacco abuse.   MEDICATIONS ON ADMISSION:  1. Altace 5 mg daily.  2. Clarinex 5 mg daily.  3. Crestor 10 mg daily.  4. Synthroid 75 mcg daily.  5. Terazosin 20 mg daily.  6. Tricor 1/2 of a 145 mg tablet daily.  7. Verapamil 240 mg daily.   ALLERGIES:  He is allergic to BACITRACIN.   FAMILY HISTORY:  Father has questionable MI.  Mother died of a stroke in her  65s.  They had a  brother who had a stroke at age 61.   SOCIAL HISTORY:  He lives by himself in [place].  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  He had been in [employer] as well.  He had 40 pack-year history of smoking; he quit in 2001.  He has abdomen  occasional alcoholic beverage but no heavy use.   REVIEW OF SYSTEMS:  He does have arthralgias, occasional urinary hesitancy.  He had a TIA-like episode back in 1998 associated with double vision that  has subsequently resolved, but he felt that that was hypertension related at  the time.  All other systems are negative.   PHYSICAL EXAMINATION:  GENERAL:  Mr. Orsak is a 76 year old gentleman in no  acute distress.  He is afebrile.  His blood pressure is 153/83, pulse oxygen  93% on room air.  His pulse is 60 and regular; respirations are 16.  NEUROLOGICAL:  He is alert and oriented x3, appropriate, and grossly intact.  HEENT:  Exam is unremarkable.  NECK:  Supple without thyromegaly, adenopathy, or bruits.  CARDIAC:  Regular rate and rhythm Normal S1-S2.  No rubs,  murmurs, or  gallops.  LUNGS:  Clear with equal breath sounds bilaterally.  ABDOMEN:  Soft and nontender.  EXTREMITIES:  Without clubbing, cyanosis, or edema.  He has 2+ radial  dorsalis pedis and posterior tibial pulses bilaterally, has brisk capillary  refill.   LABORATORY DATA:  Cardiac enzymes previously dictated.  His sodium is 141,  potassium 4.2, BUN and creatinine 18 and 1.6, glucose is 100.  His  cholesterol was 164 with an HDL of 42 and an LDL of 92.  White count was  7.5, hematocrit 38, platelets 176.  TSH was 4.034.  PT is 13.9, PTT 67 on  heparin.  His albumin is 3.3.  The liver function tests are within normal  limits.  BNP level was 124 on admission.   IMPRESSION:  Mr. Degraffenried is a 76 year old gentleman with multiple cardiac  risk factors who now presents with unstable angina and has a slight  elevation of his cardiac enzymes and at catheterization has a left dominant  circulation with severe two-vessel coronary disease, particularly severe in  the proximal left anterior descending with a heavily calcified tight lesion.  Coronary bypass grafting is the best mode of treatment.  I discussed in  detail with Mr. Passey the nature of coronary artery disease and the  treatment alternatives including medical therapy, angioplasty, and bypass  grafting and why bypass grafting is the best option in his case.  We  discussed in detail the nature and extent of the operation, the need for  general anesthesia, the incisions to be used, and the general conduct of the  procedure.   I discussed in detail with him the indications, risks, benefits, and  alternatives.  He understands that the risks of surgery include but are not  limited to death, stroke, MI, DVT, PE, bleeding, possible need for  transfusions, infections as well as other organ system dysfunction including  respiratory, renal, or hepatic or GI complications.  He does understand that he is at increased risk for perioperative  renal failure due to his baseline  renal insufficiency.  He  understands and accepts these risks and agrees to proceed with surgery as he  is currently clinically stable.  We will plan to do surgery electively early  next week at the first available operating time if he were to develop  recurrent symptoms in hospital.  He may need to have surgery done more  urgently.           ______________________________  Revonda Standard. Roxan Hockey, M.D.     SCH/MEDQ  D:  10/22/2006  T:  10/23/2006  Job:  [id number]   cc:   Loretha Brasil. Lia Foyer, MD, Merilyn Baba, M.D.

## 2011-05-08 NOTE — Assessment & Plan Note (Signed)
[facility]                            CARDIOLOGY OFFICE NOTE   CANDY, ARAMBUL                       MRN:          [id number]  DATE:02/18/2007                            DOB:          [birth year]    HISTORY OF PRESENT ILLNESS:  Mr. Arcila is a 76 year old male patient  followed by Dr. Lia Foyer with a history of coronary artery disease,  status post non-ST-elevation myocardial infarction in November 2007 with  subsequent CABG x5.  He had postoperative atrial fibrillation, and was  on amiodarone for a short amount of time.  He has also had renal  insufficiency, and has recently been referred to Nephrology.  He  underwent recent renal arterial ultrasound, which was normal.  His last  creatinine was 2.2 on January 05, 2007.  He has noted some decreased  heart rates recently, and decided to come in for further evaluation.  He  has had heart rates in the 50s in the past.  Recently, he has been  mildly symptomatic with this.  He does feel lightheaded from time to  time, especially when he stands up too quickly.  He notes that this is  usually associated with decreased heart rates.  In cardiac rehab he has  been told at times his heart rate in the 50s and upper 40s.  He denies  any syncope or near syncope.  Denies any chest discomfort or shortness  of breath.  Denies orthopnea or paroxysmal dyspnea.  He does have some  right pedal edema.  This is the left his vein was harvested from.   CURRENT MEDICATIONS:  1. Aspirin 81 mg daily.  2. Toprol XL 50 mg daily.  3. Crestor 10 mg nightly.  4. Terazosin 2 mg daily.  5. Clarinex p.r.n.  6. Synthroid 75 mcg daily.  7. Multivitamin.  8. B12.  9. Tricor 145 mg 1/2 tablet daily.   ALLERGIES:  ADHESIVE TAPE.   PHYSICAL EXAMINATION:  He is a well-nourished, well-developed gentleman.  Blood pressure 162/85.  Pulse 50.  Weight 233 pounds.  Repeat blood  pressure by me by manual cuff 154/88 on the right, 156/90  on the left.  HEENT:  Unremarkable.  NECK:  Without JVD.  CARDIAC:  S1 and S2.  Regular rhythm.  LUNGS:  Clear to auscultation bilaterally without wheeze, rhonchi or  rales.  ABDOMEN:  Soft and non-tender.  EXTREMITIES:  Without edema on the left, 1+ edema on the right.  NEUROLOGIC:  He is alert and oriented x3.  Cranial nerves 2 through 12  grossly intact.  Electrocardiogram reveals sinus bradycardia with a heart rate of 51.  First degree A-V block with a PR interval of 238 ms.  No significant  change since previous tracings.   IMPRESSION:  1. Coronary artery disease, status post coronary artery bypass graft      November 2007.  2. Sinus bradycardia, mildly symptomatic.  3. Renal insufficiency.      a.     Followed by Nephrology.  4. Hypertension.  5. Hyperlipidemia.  6. Treated hypothyroidism.  7. History of benign prostatic hypertrophy.  8. Osteoarthritis.  9. Seasonal allergies.  10.Past history of tobacco abuse.   PLAN:  The patient presents to the office today for complaints of  decreased heart rates that seem to be mildly symptomatic.  He is on  Toprol XL 50 mg a day.  He has had low heart rates in the past.  At this  point in time, I have recommended we decrease the Toprol to 25 mg a day.  He does need better control of his blood pressure, and will obviously  need more control once his Toprol is decreased.  Therefore, I have  recommended we increase his terazosin to 4 mg a day at this point in  time.  We will follow up with a BMET today, as he is due to have that  done.  We will also get a TSH level on him today, given his bradycardia.  We will make sure those results make it to Dr. Wilson Singer, as he has followup  with Dr. Wilson Singer in the near future for his hypothyroidism.  We will have  the patient follow up in the next couple of weeks with Dr. Lia Foyer.  If  he continues to be bradycardic and symptomatic, we will consider  decreasing his Toprol even more, and possibly  placing him on a Holter  monitor at the time.   Addendum:  The patient called back to note he was already on Terazosin  20 mg daily.  We therefore elected to place him on Lisinopril 10 mg  daily.  He had his ACE inhib. stopped 2-3 months ago when he was noted  to have an elevated creatinine.  It has not changed that much since  then.  I discussed this with Dr. Harrington Challenger who agreed with starting the ACE  inhib.  We will follow his renal function closely with a BMET next week.      Richardson Dopp, PA-C  Electronically Signed      Fay Records, MD, [facility]  Electronically Signed   SW/MedQ  DD: 02/18/2007  DT: 02/18/2007  Job #: [id number]   cc:   Ronaldo Miyamoto, M.D.

## 2011-05-08 NOTE — H&P (Signed)
NAME:  Derek [name], Derek [name] NO.:  [id number]   MEDICAL RECORD NO.:  [id number]          PATIENT TYPE:  INP   LOCATION:  [id number]                         FACILITY:  [facility]   PHYSICIAN:  Loretha Brasil. Lia Foyer, MD, FACCDATE OF BIRTH:  1935-08-27   DATE OF ADMISSION:  10/21/2006  DATE OF DISCHARGE:                                HISTORY & PHYSICAL   Primary care physician, Ronaldo Miyamoto, M.D.  Primary cardiologist is [facility], being seen by Dr. Bing Quarry.   PATIENT PROFILE:  A 76 year old white male with no prior history of CAD, who  presents to the [facility] ED with a 2-week history of unstable angina.   PROBLEM LIST:  1. Unstable angina.      a.     The patient reports a negative stress test approximately 10       years ago in [place], [place].  2. Hypertension.  3. Hyperlipidemia.  4. Hypertriglyceridemia.  5. Hypothyroidism.  6. Herpes zoster in 2000.  7. Questionable BPH.  8. Seasonal allergies.  9. Osteoarthritis.  10.Remote tobacco abuse, 40 pack-year history, quitting in 2001.  11.Overweight.   HISTORY OF PRESENT ILLNESS:  A 76 year old white male with no prior history  of CAD.  Approximately 2 weeks ago he was walking with his granddaughter in  [place] and developed 6-7/10 right chest heaviness with radiation to the  right arm and right temple associated with shortness of breath and  diaphoresis, lasting approximately 15 minutes and relieved with rest.  Since  then he has had approximately 3-4 more additional episodes of exertional  chest discomfort relieved with rest.  This morning he was showering and  washing his hair and had an episode that was relieved with rest after about  15 minutes and thus prompting him to present to the [facility] ED.  Here his  cardiac markers were negative x1.  His ECG is without any acute changes.  He  has been placed on IV heparin and nitroglycerin and he will be admitted and  transferred to [facility] for additional  evaluation.  He is currently pain-  free.   ALLERGIES:  BACITRACIN.   HOME MEDICATIONS:  1. Altace 5 mg daily.  2. Clarinex 5 mg daily.  3. Crestor 10 mg daily.  4. Synthroid 75 mcg daily.  5. Terazosin 20 mg daily.  6. Tricor 145 mg half a tablet daily.  7. Verapamil 240 mg daily.  8. Omacor 1 g q.i.d.   FAMILY HISTORY:  Mother died of a CVA in her 64s.  Father died of  questionable COPD, maybe an MI, at age 62.  A brother died of a CVA at age  51.  Has another brother with hypertension, and altogether has 6 brothers  and sisters, who are all alive.   SOCIAL HISTORY:  He lives in [place] by himself.  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  He has a 40 pack-  year history of tobacco abuse, quitting in 2001.  He occasionally will have  a beer and  denies any drug use.  He does not routinely exercise.   REVIEW OF SYSTEMS:  Positive for chest pain, shortness of breath, dyspnea on  exertion as outlined in the HPI.  He also has arthritis with knee pain.  Otherwise, all systems are reviewed and negative.   PHYSICAL EXAMINATION:  VITAL SIGNS:  Temperature 97.7, heart rate 60,  respirations 20, blood pressure 169/92, pulse oximetry 95% on room air.  GENERAL:  A pleasant white male in no acute distress, awake, alert and  oriented x3.  NECK:  Normal carotid upstrokes.  No bruits or JVD.  HEENT:  Atraumatic, normocephalic.  NEUROLOGIC:  Grossly intact, nonfocal.  SKIN:  Warm and dry without lesion or masses.  LUNGS:  Respirations regular and unlabored.  Clear to auscultation.  CARDIAC:  Regular, S1, S2, no S3, S4 or murmurs.  ABDOMEN:  Round, soft, nontender, nondistended.  Bowel sounds present x4.  EXTREMITIES:  Warm, dry, pink.  No clubbing, cyanosis, or edema.  PERIPHERAL VASCULAR:  Dorsalis pedis and posterior tibial pulses 2+ and  equal bilaterally.  No femoral bruits.   Chest x-ray is pending.  EKG shows sinus rhythm with a rate of  78 and normal  axis and no acute ST-T changes.   LABORATORY DATA:  Hemoglobin 13.9, hematocrit 40.4, WBC 5.9 platelets 173.  Sodium 142, potassium 4.0, chloride 103, CO2 28, BUN 27, creatinine 1.7,  glucose 100, total bilirubin 1.0, alkaline phosphatase 28, AST 26, ALT 25,  total protein 6.4, albumin 3.5, calcium 9.9.  PT 12.9, INR 1.0.  BNP  pending.   ASSESSMENT AND PLAN:  1. Unstable angina.  The patient presents with a 2-week history of      exertional angina with exertional symptoms, resolves with rest.  Will      plan to admit him and transfer him to [facility] for catheterization in      the a.m.  We will continue statin, aspirin and beta blocker.  Will      discontinue his verapamil and hold his ACE inhibitor pre-      catheterization given his creatinine of 1.7.  We will also write for      hydration to start 12 hours pre-catheterization.  2. Hypertension.  His blood pressure is currently elevated.  Will add a      beta blocker as above and switch his verapamil to Norvasc.  Would plan      to add back ACE inhibitor following catheterization provided that      creatinine normalizes or is at least stable.  3. Hyperlipidemia.  Check lipids and LFTs.  Continue statin therapy.  4. Benign prostatic hypertrophy.  Continue alpha blocker.  5. Hypothyroidism.  Check TFTs.  Continue Synthroid.  6. Seasonal allergies.  Continue Clarinex.  7. Renal insufficiency.  Not clear as to the duration.  We will hold his      ACE inhibitor pre-catheterization and provide him with hydration.  We      will have to watch his creatinine closely post catheterization.      Murray Hodgkins, ANP      Loretha Brasil. Lia Foyer, MD, [facility]  Electronically Signed    CB/MEDQ  D:  10/21/2006  T:  10/22/2006  Job:  [id number]

## 2011-05-08 NOTE — Op Note (Signed)
NAME:  Derek [name], Derek [name] NO.:  [id number]   MEDICAL RECORD NO.:  [id number]          PATIENT TYPE:  AMB   LOCATION:  ENDO                         FACILITY:  [facility]   PHYSICIAN:  Waverly Ferrari, M.D.    DATE OF BIRTH:  08-29-1935   DATE OF PROCEDURE:  03/09/2006  DATE OF DISCHARGE:                                 OPERATIVE REPORT   PROCEDURE:  Colonoscopy.   INDICATIONS:  Colon polyps.   ANESTHESIA:  Demerol 60, Versed 5 milligrams.   PROCEDURE:  With the patient mildly sedated in the left lateral decubitus  position, a rectal examination was attempted.  Subsequently Olympus  videoscopic colonoscope was inserted into the rectum and passed under direct  vision to the cecum identified by base of cecum, ileocecal valve both of  which were photographed.  From this point the colonoscope was slowly  withdrawn taking circumferential views of colonic mucosa stopping to  photograph diverticula seen along the way in sigmoid colon until we reached  the rectum which appeared normal on direct, showed hemorrhoids on  retroflexed view.  The endoscope was straightened and withdrawn. The  patient's vital signs, pulse oximeter remained stable. The patient tolerated  procedure well without apparent complications.   FINDINGS:  Diverticulosis of sigmoid colon mild to moderate, internal  hemorrhoids, otherwise unremarkable colonoscopic examination to the cecum.   PLAN:  Repeat examination in 5 years.           ______________________________  Waverly Ferrari, M.D.     GMO/MEDQ  D:  03/09/2006  T:  03/10/2006  Job:  [id number]

## 2011-05-08 NOTE — Assessment & Plan Note (Signed)
[facility]                            CARDIOLOGY OFFICE NOTE   ELEAZER, BARQUERO                       MRN:          [id number]  DATE:01/05/2007                            DOB:          03-May-1935    [name] is in for a followup visit. In general, he feels great and he  says that he has improved dramatically since the time of his surgery.  The patient was initially admitted with a non-ST elevation myocardial  infarction, and underwent revascularization surgery. He has not had  recurrence of what sounds like atrial fibrillation.   On examination today, the weight is 235 pounds, blood pressure 143/79.  The pulse is 73.  The lung fields are clear to auscultation and percussion.  PMI is nondisplaced and there is a normal 1st and 2nd heart sound.  ABDOMEN: Is moderately protuberant.   The electrocardiogram demonstrates sinus bradycardia with first-degree  AV block and inferior Q-waves. There is delay in R-wave progression.   Recent echocardiogram demonstrates estimated ejection fraction of 55%.   Chest x-ray was read as 1-cm nodule in the left lower lobe and a CT scan  was recommended. However, I have taken the films over and reviewed them  in detail with Dr. Rozetta Nunnery. The patient has previous x-rays which  demonstrated bilateral granulomatous disease, and the finding on x-ray  corresponds to the granulomatous disease that has been demonstrated.  Importantly, Dr. Zigmund Daniel said that no further evaluation of this was  necessary. On the other films, he also had evidence of a small right  granulomatous finding, and the patient says that he has been told that  he has these in the past.   IMPRESSION:  1. Coronary artery disease, status post coronary artery bypass graft      surgery.  2. Hypertension, controlled.  3. Dyslipidemia, under treatment.  4. Hypothyroidism, on thyroid replacement.  5. Recent elevation in creatinine with normal  urinalysis.   RECOMMENDATIONS:  1. We will get a CMET at the present time and if his creatinine      remains above 2, I would suggest that he see the nephrologist in      followup.  2. We will get a renal ultrasound which includes renal size and renal      vascular imaging to assess renal blood flow.  3. He will discontinue amiodarone and verapamil.  4. We will increase Toprol to 50 mg daily.     Loretha Brasil. Lia Foyer, MD, [facility]  Electronically Signed    TDS/MedQ  DD: 01/05/2007  DT: 01/05/2007  Job #: [id number]   cc:   Ronaldo Miyamoto, M.D.

## 2011-12-24 DIAGNOSIS — E119 Type 2 diabetes mellitus without complications: Secondary | ICD-10-CM | POA: Diagnosis not present

## 2011-12-24 DIAGNOSIS — I2510 Atherosclerotic heart disease of native coronary artery without angina pectoris: Secondary | ICD-10-CM | POA: Diagnosis not present

## 2011-12-24 DIAGNOSIS — E0789 Other specified disorders of thyroid: Secondary | ICD-10-CM | POA: Diagnosis not present

## 2011-12-24 DIAGNOSIS — R0609 Other forms of dyspnea: Secondary | ICD-10-CM | POA: Diagnosis not present

## 2012-01-04 ENCOUNTER — Other Ambulatory Visit: Payer: Self-pay | Admitting: Internal Medicine

## 2012-01-04 DIAGNOSIS — M25569 Pain in unspecified knee: Secondary | ICD-10-CM | POA: Diagnosis not present

## 2012-01-04 DIAGNOSIS — M159 Polyosteoarthritis, unspecified: Secondary | ICD-10-CM | POA: Diagnosis not present

## 2012-01-04 DIAGNOSIS — N189 Chronic kidney disease, unspecified: Secondary | ICD-10-CM | POA: Diagnosis not present

## 2012-01-04 DIAGNOSIS — M1A00X0 Idiopathic chronic gout, unspecified site, without tophus (tophi): Secondary | ICD-10-CM | POA: Diagnosis not present

## 2012-01-04 DIAGNOSIS — M25562 Pain in left knee: Secondary | ICD-10-CM

## 2012-01-18 ENCOUNTER — Ambulatory Visit
Admission: RE | Admit: 2012-01-18 | Discharge: 2012-01-18 | Disposition: A | Discharge status: Home / Self Care | Payer: Medicare Other | Source: Ambulatory Visit | Attending: Internal Medicine | Admitting: Internal Medicine

## 2012-01-18 DIAGNOSIS — M25562 Pain in left knee: Secondary | ICD-10-CM

## 2012-01-18 DIAGNOSIS — IMO0002 Reserved for concepts with insufficient information to code with codable children: Secondary | ICD-10-CM | POA: Diagnosis not present

## 2012-01-18 DIAGNOSIS — M7989 Other specified soft tissue disorders: Secondary | ICD-10-CM | POA: Diagnosis not present

## 2012-01-18 DIAGNOSIS — M25569 Pain in unspecified knee: Secondary | ICD-10-CM | POA: Diagnosis not present

## 2012-01-27 DIAGNOSIS — R0602 Shortness of breath: Secondary | ICD-10-CM | POA: Diagnosis not present

## 2012-02-12 DIAGNOSIS — M23305 Other meniscus derangements, unspecified medial meniscus, unspecified knee: Secondary | ICD-10-CM | POA: Diagnosis not present

## 2012-02-12 DIAGNOSIS — M1710 Unilateral primary osteoarthritis, unspecified knee: Secondary | ICD-10-CM | POA: Diagnosis not present

## 2012-02-16 DIAGNOSIS — E789 Disorder of lipoprotein metabolism, unspecified: Secondary | ICD-10-CM | POA: Diagnosis not present

## 2012-02-16 DIAGNOSIS — R042 Hemoptysis: Secondary | ICD-10-CM | POA: Diagnosis not present

## 2012-02-16 DIAGNOSIS — I517 Cardiomegaly: Secondary | ICD-10-CM | POA: Diagnosis not present

## 2012-02-16 DIAGNOSIS — I10 Essential (primary) hypertension: Secondary | ICD-10-CM | POA: Diagnosis not present

## 2012-02-16 DIAGNOSIS — J449 Chronic obstructive pulmonary disease, unspecified: Secondary | ICD-10-CM | POA: Diagnosis not present

## 2012-02-16 DIAGNOSIS — I2510 Atherosclerotic heart disease of native coronary artery without angina pectoris: Secondary | ICD-10-CM | POA: Diagnosis not present

## 2012-02-17 DIAGNOSIS — Z86010 Personal history of colonic polyps: Secondary | ICD-10-CM | POA: Diagnosis not present

## 2012-02-17 DIAGNOSIS — K219 Gastro-esophageal reflux disease without esophagitis: Secondary | ICD-10-CM | POA: Diagnosis not present

## 2012-02-17 DIAGNOSIS — K625 Hemorrhage of anus and rectum: Secondary | ICD-10-CM | POA: Diagnosis not present

## 2012-02-22 ENCOUNTER — Other Ambulatory Visit: Payer: Self-pay | Admitting: Pain Medicine

## 2012-02-23 ENCOUNTER — Encounter (HOSPITAL_BASED_OUTPATIENT_CLINIC_OR_DEPARTMENT_OTHER): Payer: Self-pay | Admitting: *Deleted

## 2012-02-23 NOTE — Progress Notes (Signed)
Pt instructed npo/ mn 3/7 x synthroid,hytrin,lopressor,spiriva w/ sip of water. To [facility] 3/8 at 1100.  Needs istat, ? ekg on arrival.  Requested ekg, cardiac studies from dr. Eugenio Hoes office. Recovery care instructions reviewed w/ pt.  Pt aware to bring home meds.

## 2012-02-26 ENCOUNTER — Ambulatory Visit (HOSPITAL_BASED_OUTPATIENT_CLINIC_OR_DEPARTMENT_OTHER): Payer: Medicare Other | Admitting: Anesthesiology

## 2012-02-26 ENCOUNTER — Ambulatory Visit (HOSPITAL_BASED_OUTPATIENT_CLINIC_OR_DEPARTMENT_OTHER)
Admission: RE | Admit: 2012-02-26 | Discharge: 2012-02-26 | Disposition: A | Discharge status: Home / Self Care | Payer: Medicare Other | Source: Ambulatory Visit | Attending: Specialist | Admitting: Specialist

## 2012-02-26 ENCOUNTER — Encounter (HOSPITAL_BASED_OUTPATIENT_CLINIC_OR_DEPARTMENT_OTHER)
Admission: RE | Disposition: A | Discharge status: Home / Self Care | Payer: Self-pay | Source: Ambulatory Visit | Attending: Specialist

## 2012-02-26 ENCOUNTER — Encounter (HOSPITAL_BASED_OUTPATIENT_CLINIC_OR_DEPARTMENT_OTHER): Payer: Self-pay | Admitting: *Deleted

## 2012-02-26 ENCOUNTER — Other Ambulatory Visit: Payer: Self-pay

## 2012-02-26 ENCOUNTER — Encounter (HOSPITAL_BASED_OUTPATIENT_CLINIC_OR_DEPARTMENT_OTHER): Payer: Self-pay | Admitting: Anesthesiology

## 2012-02-26 DIAGNOSIS — E039 Hypothyroidism, unspecified: Secondary | ICD-10-CM | POA: Diagnosis not present

## 2012-02-26 DIAGNOSIS — Z951 Presence of aortocoronary bypass graft: Secondary | ICD-10-CM | POA: Insufficient documentation

## 2012-02-26 DIAGNOSIS — I10 Essential (primary) hypertension: Secondary | ICD-10-CM | POA: Insufficient documentation

## 2012-02-26 DIAGNOSIS — M25569 Pain in unspecified knee: Secondary | ICD-10-CM | POA: Diagnosis not present

## 2012-02-26 DIAGNOSIS — Z9889 Other specified postprocedural states: Secondary | ICD-10-CM | POA: Diagnosis not present

## 2012-02-26 DIAGNOSIS — M1710 Unilateral primary osteoarthritis, unspecified knee: Secondary | ICD-10-CM | POA: Diagnosis not present

## 2012-02-26 DIAGNOSIS — M2240 Chondromalacia patellae, unspecified knee: Secondary | ICD-10-CM | POA: Diagnosis not present

## 2012-02-26 DIAGNOSIS — M23329 Other meniscus derangements, posterior horn of medial meniscus, unspecified knee: Secondary | ICD-10-CM | POA: Diagnosis not present

## 2012-02-26 DIAGNOSIS — I252 Old myocardial infarction: Secondary | ICD-10-CM | POA: Insufficient documentation

## 2012-02-26 DIAGNOSIS — G4730 Sleep apnea, unspecified: Secondary | ICD-10-CM | POA: Insufficient documentation

## 2012-02-26 DIAGNOSIS — Z79899 Other long term (current) drug therapy: Secondary | ICD-10-CM | POA: Diagnosis not present

## 2012-02-26 DIAGNOSIS — IMO0002 Reserved for concepts with insufficient information to code with codable children: Secondary | ICD-10-CM | POA: Diagnosis not present

## 2012-02-26 DIAGNOSIS — M9420 Chondromalacia, unspecified site: Secondary | ICD-10-CM | POA: Diagnosis not present

## 2012-02-26 DIAGNOSIS — X58XXXA Exposure to other specified factors, initial encounter: Secondary | ICD-10-CM | POA: Insufficient documentation

## 2012-02-26 DIAGNOSIS — I2510 Atherosclerotic heart disease of native coronary artery without angina pectoris: Secondary | ICD-10-CM | POA: Diagnosis not present

## 2012-02-26 HISTORY — DX: Shortness of breath: R06.02

## 2012-02-26 HISTORY — DX: Hypothyroidism, unspecified: E03.9

## 2012-02-26 HISTORY — DX: Unspecified osteoarthritis, unspecified site: M19.90

## 2012-02-26 HISTORY — DX: Essential (primary) hypertension: I10

## 2012-02-26 HISTORY — DX: Atherosclerotic heart disease of native coronary artery without angina pectoris: I25.10

## 2012-02-26 HISTORY — PX: KNEE ARTHROSCOPY: SHX127

## 2012-02-26 HISTORY — DX: Acute myocardial infarction, unspecified: I21.9

## 2012-02-26 HISTORY — DX: Sleep apnea, unspecified: G47.30

## 2012-02-26 LAB — POCT I-STAT 4, (NA,K, GLUC, HGB,HCT)
Glucose, Bld: 105 mg/dL — ABNORMAL HIGH (ref 70–99)
Potassium: 4.2 mEq/L (ref 3.5–5.1)

## 2012-02-26 SURGERY — ARTHROSCOPY, KNEE
Anesthesia: Monitor Anesthesia Care | Site: Knee | Laterality: Left | Wound class: Clean

## 2012-02-26 MED ORDER — BUPIVACAINE HCL 0.25 % IJ SOLN
INTRAMUSCULAR | Status: DC | PRN
  Administered 2012-02-26: 27 mL

## 2012-02-26 MED ORDER — HYDROCODONE-ACETAMINOPHEN 5-325 MG PO TABS: 1.0000 | ORAL_TABLET | ORAL | Status: AC | PRN

## 2012-02-26 MED ORDER — POVIDONE-IODINE 7.5 % EX SOLN: Freq: Once | CUTANEOUS | Status: DC

## 2012-02-26 MED ORDER — LIDOCAINE HCL (PF) 1 % IJ SOLN
INTRAMUSCULAR | Status: DC | PRN
  Administered 2012-02-26: 280 mg via INTRADERMAL

## 2012-02-26 MED ORDER — CEFAZOLIN SODIUM-DEXTROSE 2-3 GM-% IV SOLR
2.0000 g | INTRAVENOUS | Status: AC
  Administered 2012-02-26: 2 g via INTRAVENOUS

## 2012-02-26 MED ORDER — SODIUM CHLORIDE 0.9 % IJ SOLN
Freq: Once | INTRAMUSCULAR | Status: DC
  Filled 2012-02-26: qty 15

## 2012-02-26 MED ORDER — LACTATED RINGERS IV SOLN
INTRAVENOUS | Status: DC
  Administered 2012-02-26: 100 mL/h via INTRAVENOUS

## 2012-02-26 MED ORDER — LIDOCAINE HCL (CARDIAC) 20 MG/ML IV SOLN
INTRAVENOUS | Status: DC | PRN
  Administered 2012-02-26 (×2): 50 mg via INTRAVENOUS

## 2012-02-26 MED ORDER — MIDAZOLAM HCL 2 MG/2ML IJ SOLN
3.0000 mg | Freq: Once | INTRAMUSCULAR | Status: AC
  Administered 2012-02-26: 3 mg via INTRAVENOUS

## 2012-02-26 MED ORDER — FENTANYL CITRATE 0.05 MG/ML IJ SOLN: 25.0000 ug | INTRAMUSCULAR | Status: DC | PRN

## 2012-02-26 MED ORDER — FENTANYL CITRATE 0.05 MG/ML IJ SOLN
INTRAMUSCULAR | Status: DC | PRN
  Administered 2012-02-26 (×2): 12.5 ug via INTRAVENOUS
  Administered 2012-02-26: 25 ug via INTRAVENOUS

## 2012-02-26 MED ORDER — SODIUM CHLORIDE 0.9 % IR SOLN
Status: DC | PRN
  Administered 2012-02-26: 6000 mL/h

## 2012-02-26 MED ORDER — FENTANYL CITRATE 0.05 MG/ML IJ SOLN
50.0000 ug | Freq: Once | INTRAMUSCULAR | Status: AC
  Administered 2012-02-26: 100 ug via INTRAVENOUS

## 2012-02-26 MED ORDER — SODIUM CHLORIDE 0.9 % IV SOLN: INTRAVENOUS | Status: DC

## 2012-02-26 MED ORDER — ONDANSETRON HCL 4 MG/2ML IJ SOLN
INTRAMUSCULAR | Status: DC | PRN
  Administered 2012-02-26 (×2): 2 mg via INTRAVENOUS

## 2012-02-26 MED ORDER — LACTATED RINGERS IV SOLN: INTRAVENOUS | Status: DC

## 2012-02-26 MED ORDER — MORPHINE SULFATE 4 MG/ML IJ SOLN
INTRAMUSCULAR | Status: DC | PRN
  Administered 2012-02-26: 4 mg via INTRAVENOUS

## 2012-02-26 MED ORDER — CEPHALEXIN 500 MG PO CAPS: 500.0000 mg | ORAL_CAPSULE | Freq: Three times a day (TID) | ORAL | Status: AC

## 2012-02-26 MED ORDER — SODIUM CHLORIDE 0.9 % IJ SOLN
INTRAMUSCULAR | Status: DC | PRN
  Administered 2012-02-26: 30 mL via INTRAVENOUS

## 2012-02-26 MED ORDER — PROPOFOL 10 MG/ML IV EMUL
INTRAVENOUS | Status: DC | PRN
  Administered 2012-02-26: 120 ug/kg/min via INTRAVENOUS

## 2012-02-26 MED ORDER — PROPOFOL 10 MG/ML IV EMUL
INTRAVENOUS | Status: DC | PRN
  Administered 2012-02-26: 40 mg via INTRAVENOUS

## 2012-02-26 SURGICAL SUPPLY — 52 items
BANDAGE ELASTIC 6 VELCRO ST LF (GAUZE/BANDAGES/DRESSINGS) ×2 IMPLANT
BANDAGE ESMARK 6X9 LF (GAUZE/BANDAGES/DRESSINGS) IMPLANT
BANDAGE GAUZE ELAST BULKY 4 IN (GAUZE/BANDAGES/DRESSINGS) ×4 IMPLANT
BLADE 4.2CUDA (BLADE) IMPLANT
BLADE CUDA GRT WHITE 3.5 (BLADE) ×4 IMPLANT
BLADE CUDA SHAVER 3.5 (BLADE) ×4 IMPLANT
BNDG CMPR 9X6 STRL LF SNTH (GAUZE/BANDAGES/DRESSINGS)
BNDG ESMARK 6X9 LF (GAUZE/BANDAGES/DRESSINGS)
CANISTER SUCT LVC 12 LTR MEDI- (MISCELLANEOUS) ×4 IMPLANT
CANISTER SUCTION 1200CC (MISCELLANEOUS) ×2 IMPLANT
CLOTH BEACON ORANGE TIMEOUT ST (SAFETY) ×4 IMPLANT
DRAPE ARTHROSCOPY W/POUCH 114 (DRAPES) ×4 IMPLANT
DRAPE INCISE 23X17 IOBAN STRL (DRAPES) ×2
DRAPE INCISE IOBAN 23X17 STRL (DRAPES) ×2 IMPLANT
DRSG PAD ABDOMINAL 8X10 ST (GAUZE/BANDAGES/DRESSINGS) ×2 IMPLANT
DURAPREP 26ML APPLICATOR (WOUND CARE) ×2 IMPLANT
ELECT MENISCUS 165MM 90D (ELECTRODE) IMPLANT
ELECT REM PT RETURN 9FT ADLT (ELECTROSURGICAL)
ELECTRODE REM PT RTRN 9FT ADLT (ELECTROSURGICAL) IMPLANT
GAUZE KERLIX 2  STERILE LF (GAUZE/BANDAGES/DRESSINGS) ×2 IMPLANT
GAUZE SPONGE 4X4 12PLY STRL LF (GAUZE/BANDAGES/DRESSINGS) ×2 IMPLANT
GAUZE XEROFORM 1X8 LF (GAUZE/BANDAGES/DRESSINGS) ×2 IMPLANT
GLOVE BIO SURGEON STRL SZ 6.5 (GLOVE) ×2 IMPLANT
GLOVE BIOGEL PI IND STRL 8 (GLOVE) ×4 IMPLANT
GLOVE BIOGEL PI INDICATOR 8 (GLOVE) ×4
GLOVE ECLIPSE 6.0 STRL STRAW (GLOVE) ×2 IMPLANT
GLOVE INDICATOR 8.0 STRL GRN (GLOVE) ×8 IMPLANT
GLOVE SKINSENSE NS SZ6.5 (GLOVE) ×1
GLOVE SKINSENSE STRL SZ6.5 (GLOVE) ×1 IMPLANT
GLOVE SURG ORTHO 8.0 STRL STRW (GLOVE) IMPLANT
GOWN PREVENTION PLUS LG XLONG (DISPOSABLE) ×2 IMPLANT
GOWN STRL NON-REIN LRG LVL3 (GOWN DISPOSABLE) ×4 IMPLANT
GOWN STRL REIN XL XLG (GOWN DISPOSABLE) ×8 IMPLANT
IMMOBILIZER KNEE 22 UNIV (SOFTGOODS) IMPLANT
IMMOBILIZER KNEE 24 THIGH 36 (MISCELLANEOUS) IMPLANT
IMMOBILIZER KNEE 24 UNIV (MISCELLANEOUS)
KNEE WRAP E Z 3 GEL PACK (MISCELLANEOUS) ×2 IMPLANT
MINI VAC (SURGICAL WAND) ×4 IMPLANT
PACK ARTHROSCOPY DSU (CUSTOM PROCEDURE TRAY) ×4 IMPLANT
PACK BASIN DAY SURGERY FS (CUSTOM PROCEDURE TRAY) ×4 IMPLANT
PADDING CAST ABS 4INX4YD NS (CAST SUPPLIES)
PADDING CAST ABS COTTON 4X4 ST (CAST SUPPLIES) IMPLANT
PENCIL BUTTON HOLSTER BLD 10FT (ELECTRODE) IMPLANT
SET ARTHROSCOPY TUBING (MISCELLANEOUS) ×4
SET ARTHROSCOPY TUBING LN (MISCELLANEOUS) ×2 IMPLANT
SPONGE GAUZE 4X4 12PLY (GAUZE/BANDAGES/DRESSINGS) ×4 IMPLANT
SUT ETHILON 3 0 FSL (SUTURE) ×4 IMPLANT
SYR CONTROL 10ML LL (SYRINGE) ×4 IMPLANT
TOWEL OR 17X24 6PK STRL BLUE (TOWEL DISPOSABLE) ×4 IMPLANT
WAND 30 DEG SABER W/CORD (SURGICAL WAND) IMPLANT
WAND 90 DEG TURBOVAC W/CORD (SURGICAL WAND) IMPLANT
WATER STERILE IRR 500ML POUR (IV SOLUTION) ×2 IMPLANT

## 2012-02-26 NOTE — Op Note (Signed)
Preop diagnosis left knee torn medial meniscus osteoarthritis Postoperative diagnosis left knee complex tear medial meniscus osteoarthritis grade 23, estimated to plateau grade 3 as well as grade 4 chondral malacia lateral compartment Procedure    left knee arthroscopic partial medial meniscectomy chondroplasty medial compartment and chondroplasty of lateral compartment. Surgeon Hart Robinsons.MD Asst. Foye Spurling Anesthesia left knee block with monitored anesthesia care As per blood loss minimal Complicates none Disposition PACU stable.  Operative details Patient counseling out of Daleen Snook was marked and signed appropriately to sedation was given block was administered. A was operated about 2 g of IV Ancef were given. The orifices supine position under the fortune placed about holder prepped with DuraPrep and draped in a sterile fashion. Patient has history of latex allergy ligament partial utilized. Comment was done and was confirmed by all involved and room. Arthroscopic portals were established proximal medial inferomedial inferolateral diagnostic provocative telephone tracking of the anatomic minimal cartilage changes souped attachment to unremarkable anterior cruciate ligament PCL intact reimbursement a large complex medial meniscus orthodontically flaps and meniscus motorized shaver Portman central match the base probably beveled and contoured and then smoothed down with the ArthroCare Mityvac system..  The lateral compartment was inspected there was mild fraying of the lateral meniscus but no obvious tear. It was grateful chondromalacia noted area of the medial to lateral to plateau and a very grateful chondromalacia the lateral femoral condyle. Both of these are good motorized shaver and smoothed down nicely knee was sequentially reinspected no other abnormalities noted. Portals were closed with 4 nylon suture. 20 cc of 0.25% Sensorcaine was injected to the scan is obtained tissue and almost  walking socket was injected in the joint. Patient tolerated the procedure well. Sponge and a count correct. Sterile compressive dressing was applied ice pack awakened and taken to the operating room to the PACU in stable condition.

## 2012-02-26 NOTE — Anesthesia Procedure Notes (Signed)
Anesthesia Regional Block:   Narrative:    Surgeon requested intraarticular block for left knee for left knee arthroscopy under MAC.  R/B discussed with patient and accepted.  Time out performed.  Prepped sterily.  7cc 1% lido at 4 port sites.  30cc 0.25% ropivicaine with 1:200,000 epi injected intraarticular with 18 ga needle.  Patient tolerated procedure well.   1315 to 1320.  Rod Mae MD

## 2012-02-26 NOTE — Transfer of Care (Signed)
Immediate Anesthesia Transfer of Care Note  Patient: Derek [name]  Procedure(s) Performed: Procedure(s) (LRB): ARTHROSCOPY KNEE (Left)  Patient Location: PACU  Anesthesia Type: General  Level of Consciousness: drowsy, arouses to name, follows commands  Airway & Oxygen Therapy: Patient Spontanous Breathing and Patient connected to face mask oxygen  Post-op Assessment: Report given to PACU RN and Post -op Vital signs reviewed and stable  Post vital signs: Reviewed and stable  Complications: No apparent anesthesia complications

## 2012-02-26 NOTE — Anesthesia Postprocedure Evaluation (Signed)
  Anesthesia Post-op Note  Patient: Derek [name]  Procedure(s) Performed: Procedure(s) (LRB): ARTHROSCOPY KNEE (Left)  Patient Location: PACU  Anesthesia Type: MAC. (Heavy sedation)  Level of Consciousness: awake and alert   Airway and Oxygen Therapy: Patient Spontanous Breathing  Post-op Pain: mild  Post-op Assessment: Post-op Vital signs reviewed, Patient's Cardiovascular Status Stable, Respiratory Function Stable, Patent Airway and No signs of Nausea or vomiting  Post-op Vital Signs: stable  Complications: No apparent anesthesia complications. H/O mild sleep apnea in the past. Could not get used to CPAP. Son will be with patient tonight. Will keep in recovery for 2 hours. Wide awake now.

## 2012-02-26 NOTE — Anesthesia Postprocedure Evaluation (Signed)
Anesthesia Post Note  Patient: Derek [name]  Procedure(s) Performed: Procedure(s) (LRB): ARTHROSCOPY KNEE (Left)  Anesthesia type: MAC  Patient location: PACU  Post pain: Pain level controlled  Post assessment: Post-op Vital signs reviewed  Last Vitals:  Filed Vitals:   02/26/12 1445  BP: 110/69  Pulse: 52  Temp: 36.4 C  Resp: 14    Post vital signs: Reviewed  Level of consciousness: sedated  Complications: No apparent anesthesia complications

## 2012-02-26 NOTE — H&P (Signed)
Derek [name] is an 77 y.o. male.   Chief Complaint:  Left knee pain HPI: 77 year old gentleman with knee pain referred from Dr. Rondel Oh for evaluation of his knee complaints of pain swelling and instability [employer] includes injections his MRI findings shows diffuse degenerative tearing of the posterior horn and body of the medial meniscus with a possible small flip the fragment with medial greater than lateral compartment degenerative chondrocalcinosis intact ligaments  Past Medical History  Diagnosis Date  . Coronary artery disease   . Hypertension   . Hypothyroidism   . Arthritis   . Myocardial infarction   . Sleep apnea     states mild . no cpap  . Shortness of breath     on exertion;  has productive cough at times    Past Surgical History  Procedure Date  . Coronary artery bypass graft 2007  . Appendectomy 1963    History reviewed. No pertinent family history. Social History:  reports that he quit smoking about 12 years ago. He does not have any smokeless tobacco history on file. He reports that he drinks alcohol. His drug history not on file.  Allergies:  Allergies  Allergen Reactions  . Latex Rash  . Ointment, White Rash    Mycins ointment  . Tape Rash    Adhesive tape    Medications Prior to Admission  Medication Dose Route Frequency Provider Last Rate Last Dose  . 0.9 %  sodium chloride infusion   Intravenous Continuous Evert Kohl, PA      . ceFAZolin (ANCEF) IVPB 2 g/50 mL premix  2 g Intravenous 60 min Pre-Op Evert Kohl, PA      . lactated ringers infusion   Intravenous Continuous Peyton Najjar, MD 100 mL/hr at 02/26/12 1145 100 mL/hr at 02/26/12 1145  . povidone-iodine (BETADINE) 7.5 % scrub   Topical Once Evert Kohl, [place]      . ropivacaine 0.25% [name]/ EPINEPHrine 1:200,000 inj - Anesthesia use   Intravenous Once Sydnee Cabal, MD      . sodium chloride irrigation 0.9 %    Continuous PRN Sydnee Cabal, MD 6,000 mL/hr at 02/26/12 1308 6,000 mL/hr at  02/26/12 1308   Medications Prior to Admission  Medication Sig Dispense Refill  . acetaminophen (TYLENOL) 325 MG tablet Take 650 mg by mouth every 4 (four) hours as needed.      Marland Kitchen amLODipine-valsartan (EXFORGE) 5-160 MG per tablet Take 1 tablet by mouth daily.      Marland Kitchen aspirin 81 MG tablet Take 81 mg by mouth daily.      . beta carotene [name]/minerals (OCUVITE) tablet Take 1 tablet by mouth daily.      Marland Kitchen desloratadine (CLARINEX) 5 MG tablet Take 5 mg by mouth as needed.      . fish oil-omega-3 fatty acids 1000 MG capsule Take 2 g by mouth 2 (two) times daily.      Marland Kitchen guaiFENesin (MUCINEX) 600 MG 12 hr tablet Take 1,200 mg by mouth as needed.      Marland Kitchen levothyroxine (SYNTHROID, LEVOTHROID) 75 MCG tablet Take 75 mcg by mouth daily.      . metoprolol succinate (TOPROL-XL) 25 MG 24 hr tablet Take 25 mg by mouth 2 (two) times daily.      . rosuvastatin (CRESTOR) 20 MG tablet Take 20 mg by mouth daily.      Marland Kitchen terazosin (HYTRIN) 10 MG capsule Take 10 mg by mouth 2 (two) times daily.      Marland Kitchen  tiotropium (SPIRIVA) 18 MCG inhalation capsule Take 18 mcg by mouth daily.      . vitamin B-12 (CYANOCOBALAMIN) 1000 MCG tablet Take 1,000 mcg by mouth daily.        Results for orders placed during the hospital encounter of 02/26/12 (from the past 48 hour(s))  POCT I-STAT 4, (NA,K, GLUC, HGB,HCT)     Status: Abnormal   Collection Time   02/26/12 11:50 AM      Component Value Range Comment   Sodium 143  135 - 145 (mEq/L)    Potassium 4.2  3.5 - 5.1 (mEq/L)    Glucose, Bld 105 (*) 70 - 99 (mg/dL)    HCT 39.0  39.0 - 52.0 (%)    Hemoglobin 13.3  13.0 - 17.0 (g/dL)    No results found.  Review of Systems  Constitutional: Negative.   HENT: Negative.   Respiratory: Negative.   Cardiovascular: Negative.   Musculoskeletal: Positive for joint pain.  Neurological: Negative.     Blood pressure 132/76, pulse 53, temperature 97.6 F (36.4 C), temperature source Oral, resp. rate 18, height 5\' 9"  (1.753 m), weight 108.863  kg (240 lb), SpO2 92.00%. Physical Exam patient's conscious alert and appropriate sitting up in a hospital gurney in no distress lung sounds are clear heart was regular abdomen soft upper extremities lower extremity neuromotor vascularly intact  Assessment/Plan Impression left knee pain with MRI positive medial meniscal tear and a tricompartment degenerative changes Plan left knee scope with meniscal debridement  Derek [name] 02/26/2012, 1:14 PM

## 2012-02-26 NOTE — Anesthesia Preprocedure Evaluation (Addendum)
Anesthesia Evaluation  Patient identified by MRN, date of birth, ID band Patient awake    Reviewed: Allergy & Precautions, H&P , NPO status , Patient's Chart, lab work & pertinent test results, reviewed documented beta blocker date and time   Airway Mallampati: II TM Distance: >3 FB Neck ROM: full    Dental No notable dental hx.    Pulmonary neg pulmonary ROS, shortness of breath and with exertion, sleep apnea ,  breath sounds clear to auscultation  Pulmonary exam normal       Cardiovascular Exercise Tolerance: Good hypertension, Pt. on home beta blockers and Pt. on medications + CAD, + Past MI and + CABG negative cardio ROS  Rhythm:regular Rate:Normal  No CP or problems with heart since CABG   Neuro/Psych negative neurological ROS  negative psych ROS   GI/Hepatic negative GI ROS, Neg liver ROS,   Endo/Other  negative endocrine ROSHypothyroidism   Renal/GU negative Renal ROS  negative genitourinary   Musculoskeletal   Abdominal   Peds  Hematology negative hematology ROS (+)   Anesthesia Other Findings   Reproductive/Obstetrics negative OB ROS                          Anesthesia Physical Anesthesia Plan  ASA: III  Anesthesia Plan: MAC   Post-op Pain Management:    Induction:   Airway Management Planned: Simple Face Mask  Additional Equipment:   Intra-op Plan:   Post-operative Plan:   Informed Consent: I have reviewed the patients History and Physical, chart, labs and discussed the procedure including the risks, benefits and alternatives for the proposed anesthesia with the patient or authorized representative who has indicated his/her understanding and acceptance.   Dental Advisory Given  Plan Discussed with: CRNA and Surgeon  Anesthesia Plan Comments:         Anesthesia Quick Evaluation

## 2012-02-29 ENCOUNTER — Encounter (HOSPITAL_BASED_OUTPATIENT_CLINIC_OR_DEPARTMENT_OTHER): Payer: Self-pay | Admitting: Specialist

## 2012-03-03 ENCOUNTER — Encounter (HOSPITAL_BASED_OUTPATIENT_CLINIC_OR_DEPARTMENT_OTHER): Payer: Self-pay

## 2012-03-03 DIAGNOSIS — M23329 Other meniscus derangements, posterior horn of medial meniscus, unspecified knee: Secondary | ICD-10-CM | POA: Diagnosis not present

## 2012-03-08 DIAGNOSIS — M23329 Other meniscus derangements, posterior horn of medial meniscus, unspecified knee: Secondary | ICD-10-CM | POA: Diagnosis not present

## 2012-03-11 DIAGNOSIS — M23329 Other meniscus derangements, posterior horn of medial meniscus, unspecified knee: Secondary | ICD-10-CM | POA: Diagnosis not present

## 2012-03-14 DIAGNOSIS — M23329 Other meniscus derangements, posterior horn of medial meniscus, unspecified knee: Secondary | ICD-10-CM | POA: Diagnosis not present

## 2012-03-15 DIAGNOSIS — E789 Disorder of lipoprotein metabolism, unspecified: Secondary | ICD-10-CM | POA: Diagnosis not present

## 2012-03-15 DIAGNOSIS — I10 Essential (primary) hypertension: Secondary | ICD-10-CM | POA: Diagnosis not present

## 2012-03-15 DIAGNOSIS — R0989 Other specified symptoms and signs involving the circulatory and respiratory systems: Secondary | ICD-10-CM | POA: Diagnosis not present

## 2012-03-17 DIAGNOSIS — Z86010 Personal history of colonic polyps: Secondary | ICD-10-CM | POA: Diagnosis not present

## 2012-03-17 DIAGNOSIS — D126 Benign neoplasm of colon, unspecified: Secondary | ICD-10-CM | POA: Diagnosis not present

## 2012-04-04 DIAGNOSIS — M159 Polyosteoarthritis, unspecified: Secondary | ICD-10-CM | POA: Diagnosis not present

## 2012-04-04 DIAGNOSIS — M1A00X0 Idiopathic chronic gout, unspecified site, without tophus (tophi): Secondary | ICD-10-CM | POA: Diagnosis not present

## 2012-04-04 DIAGNOSIS — N189 Chronic kidney disease, unspecified: Secondary | ICD-10-CM | POA: Diagnosis not present

## 2012-04-25 DIAGNOSIS — E789 Disorder of lipoprotein metabolism, unspecified: Secondary | ICD-10-CM | POA: Diagnosis not present

## 2012-04-28 DIAGNOSIS — I10 Essential (primary) hypertension: Secondary | ICD-10-CM | POA: Diagnosis not present

## 2012-04-28 DIAGNOSIS — M79609 Pain in unspecified limb: Secondary | ICD-10-CM | POA: Diagnosis not present

## 2012-04-28 DIAGNOSIS — I2510 Atherosclerotic heart disease of native coronary artery without angina pectoris: Secondary | ICD-10-CM | POA: Diagnosis not present

## 2012-04-28 DIAGNOSIS — E789 Disorder of lipoprotein metabolism, unspecified: Secondary | ICD-10-CM | POA: Diagnosis not present

## 2012-06-09 DIAGNOSIS — M1710 Unilateral primary osteoarthritis, unspecified knee: Secondary | ICD-10-CM | POA: Diagnosis not present

## 2012-06-16 DIAGNOSIS — M1710 Unilateral primary osteoarthritis, unspecified knee: Secondary | ICD-10-CM | POA: Diagnosis not present

## 2012-06-22 DIAGNOSIS — M1710 Unilateral primary osteoarthritis, unspecified knee: Secondary | ICD-10-CM | POA: Diagnosis not present

## 2012-08-03 DIAGNOSIS — M1710 Unilateral primary osteoarthritis, unspecified knee: Secondary | ICD-10-CM | POA: Diagnosis not present

## 2012-08-15 DIAGNOSIS — E789 Disorder of lipoprotein metabolism, unspecified: Secondary | ICD-10-CM | POA: Diagnosis not present

## 2012-08-23 DIAGNOSIS — I10 Essential (primary) hypertension: Secondary | ICD-10-CM | POA: Diagnosis not present

## 2012-08-23 DIAGNOSIS — R0609 Other forms of dyspnea: Secondary | ICD-10-CM | POA: Diagnosis not present

## 2012-08-23 DIAGNOSIS — M79609 Pain in unspecified limb: Secondary | ICD-10-CM | POA: Diagnosis not present

## 2012-08-23 DIAGNOSIS — R0989 Other specified symptoms and signs involving the circulatory and respiratory systems: Secondary | ICD-10-CM | POA: Diagnosis not present

## 2012-08-23 DIAGNOSIS — E789 Disorder of lipoprotein metabolism, unspecified: Secondary | ICD-10-CM | POA: Diagnosis not present

## 2012-08-24 DIAGNOSIS — M5137 Other intervertebral disc degeneration, lumbosacral region: Secondary | ICD-10-CM | POA: Diagnosis not present

## 2012-08-24 DIAGNOSIS — M545 Low back pain: Secondary | ICD-10-CM | POA: Diagnosis not present

## 2012-08-26 ENCOUNTER — Other Ambulatory Visit: Payer: Self-pay | Admitting: Physical Medicine and Rehabilitation

## 2012-08-26 DIAGNOSIS — M545 Low back pain: Secondary | ICD-10-CM

## 2012-08-31 ENCOUNTER — Ambulatory Visit
Admission: RE | Admit: 2012-08-31 | Discharge: 2012-08-31 | Disposition: A | Discharge status: Home / Self Care | Payer: Medicare Other | Source: Ambulatory Visit | Attending: Physical Medicine and Rehabilitation | Admitting: Physical Medicine and Rehabilitation

## 2012-08-31 DIAGNOSIS — I714 Abdominal aortic aneurysm, without rupture: Secondary | ICD-10-CM | POA: Diagnosis not present

## 2012-08-31 DIAGNOSIS — M545 Low back pain: Secondary | ICD-10-CM

## 2012-09-05 DIAGNOSIS — M5137 Other intervertebral disc degeneration, lumbosacral region: Secondary | ICD-10-CM | POA: Diagnosis not present

## 2012-09-07 ENCOUNTER — Encounter: Payer: Self-pay | Admitting: Vascular Surgery

## 2012-09-08 ENCOUNTER — Encounter: Payer: Self-pay | Admitting: Vascular Surgery

## 2012-09-08 ENCOUNTER — Ambulatory Visit (INDEPENDENT_AMBULATORY_CARE_PROVIDER_SITE_OTHER): Payer: Medicare Other | Admitting: Vascular Surgery

## 2012-09-08 VITALS — BP 157/70 | HR 49 | Resp 16 | Ht 69.0 in | Wt 240.0 lb

## 2012-09-08 DIAGNOSIS — I714 Abdominal aortic aneurysm, without rupture, unspecified: Secondary | ICD-10-CM | POA: Insufficient documentation

## 2012-09-08 NOTE — Addendum Note (Signed)
Addended by: Mena Goes on: 09/08/2012 01:17 PM   Modules accepted: Orders

## 2012-09-08 NOTE — Progress Notes (Signed)
[facility] [place] HISTORY AND PHYSICAL   History of Present Illness:  Patient is a 77 y.o. year old male who presents for evaluation of abdominal aortic aneurysm.  The aneurysm is currently 4cm in diameter by US performed at [place] 08/31/2012.  The patient denies abdominal pain.  The patient complains of back pain but this is chronic in nature.  The patient denies family history of AAA.  Other medical problems include moderate COPD, coronary disease status post CABG, hypertension  Past Medical History  Diagnosis Date  . Coronary artery disease   . Hypertension   . Hypothyroidism   . Arthritis   . Myocardial infarction   . Sleep apnea     states mild . no cpap  . Shortness of breath     on exertion;  has productive cough at times  . COPD (chronic obstructive pulmonary disease)     Past Surgical History  Procedure Date  . Coronary artery bypass graft 2007  . Knee arthroscopy 02/26/2012    Procedure: ARTHROSCOPY KNEE;  Surgeon: Sydnee Cabal, MD;  Location: [facility];  Service: Orthopedics;  Laterality: Left;  medial menisectomy and chondraplasty  . Appendectomy 1963     Social History History  Substance Use Topics  . Smoking status: Former Smoker    Types: Cigarettes    Quit date: 02/23/2000  . Smokeless tobacco: Never Used  . Alcohol Use: 0.0 - 0.6 oz/week    0-1 Cans of beer per week    Family History Family History  Problem Relation Age of Onset  . Diabetes Mother   . Heart disease Mother   . Diabetes Sister   . Diabetes Brother   . Hypertension Brother   . Heart attack Daughter     Allergies  Allergies  Allergen Reactions  . Latex Rash  . Ointment, White Rash    Mycins ointment  . Tape Rash    Adhesive tape     Current Outpatient Prescriptions  Medication Sig Dispense Refill  . acetaminophen (TYLENOL) 325 MG tablet Take 650 mg by mouth every 4 (four) hours as needed.      Marland Kitchen amLODipine-valsartan  (EXFORGE) 5-160 MG per tablet Take 1 tablet by mouth daily.      Marland Kitchen aspirin 81 MG tablet Take 81 mg by mouth daily.      . Choline Fenofibrate 135 MG capsule Take 135 mg by mouth daily.      Marland Kitchen desloratadine (CLARINEX) 5 MG tablet Take 5 mg by mouth as needed.      . fish oil-omega-3 fatty acids 1000 MG capsule Take 2 g by mouth 2 (two) times daily.      . Glucosamine-Chondroit-Vit C-Mn (GLUCOSAMINE 1500 COMPLEX) CAPS Take 1 capsule by mouth.      Marland Kitchen guaiFENesin (MUCINEX) 600 MG 12 hr tablet Take 1,200 mg by mouth as needed.      Marland Kitchen levothyroxine (SYNTHROID, LEVOTHROID) 75 MCG tablet Take 88 mcg by mouth daily.       . metoprolol succinate (TOPROL-XL) 25 MG 24 hr tablet Take 25 mg by mouth 2 (two) times daily.      . Multiple Vitamin (MULTIVITAMIN) tablet Take 1 tablet by mouth daily.      . rosuvastatin (CRESTOR) 20 MG tablet Take 20 mg by mouth daily.      Marland Kitchen terazosin (HYTRIN) 10 MG capsule Take 10 mg by mouth 2 (two) times daily.      Marland Kitchen tiotropium (SPIRIVA) 18 MCG  inhalation capsule Take 18 mcg by mouth daily.      . vitamin B-12 (CYANOCOBALAMIN) 1000 MCG tablet Take 1,000 mcg by mouth daily.      . beta carotene w/minerals (OCUVITE) tablet Take 1 tablet by mouth daily.      . Omega-3-acid Ethyl Esters (OMACOR PO) Take 1 g by mouth 4 (four) times daily.        ROS:   General:  No weight loss, Fever, chills  HEENT: No recent headaches, no nasal bleeding, no visual changes, no sore throat  Neurologic: No dizziness, blackouts, seizures. No recent symptoms of stroke or mini- stroke. No recent episodes of slurred speech, or temporary blindness.  Cardiac: No recent episodes of chest pain/pressure, no shortness of breath at rest.  + shortness of breath with exertion.  Denies history of atrial fibrillation or irregular heartbeat  Vascular: No history of rest pain in feet.  No history of claudication.  No history of non-healing ulcer, No history of DVT   Pulmonary: No home oxygen, no productive  cough, no hemoptysis,  No asthma or wheezing  Musculoskeletal:  [x ] Arthritis, [x ] Low back pain,  [ ]  Joint pain  Hematologic:No history of hypercoagulable state.  No history of easy bleeding.  No history of anemia  Gastrointestinal: No hematochezia or melena,  No gastroesophageal reflux, no trouble swallowing  Urinary: [ ]  chronic Kidney disease, [ ]  on HD - [ ]  MWF or [ ]  TTHS, [ ]  Burning with urination, [ ]  Frequent urination, [ ]  Difficulty urinating;   Skin: No rashes  Psychological: No history of anxiety,  No history of depression   Physical Examination  Filed Vitals:   09/08/12 1041  BP: 157/70  Pulse: 49  Resp: 16  Height: 5\' 9"  (1.753 m)  Weight: 240 lb (108.863 kg)  SpO2: 95%    Body mass index is 35.44 kg/(m^2).  General:  Alert and oriented, no acute distress HEENT: Normal Neck: No bruit or JVD Pulmonary: Basilar crackles bilaterally Cardiac: Regular Rate and Rhythm without murmur Gastrointestinal: Soft, non-tender, non-distended, no mass, obese Skin: No rash Extremity Pulses:  2+ radial, brachial, femoral, 2+ right dorsalis pedis, 2+ left posterior tibial pulses Musculoskeletal: No deformity or edema  Neurologic: Upper and lower extremity motor 5/5 and symmetric  DATA:   Aortic ultrasound was reviewed which shows aneurysm diameter was 3.8 x 4 cm     ASSESSMENT: Abdominal aortic aneurysm 4 cm diameter asymptomatic   PLAN:  The patient return for a repeat abdominal aortic ultrasound in 6 months time. If the aneurysm continues to grow over time it reaches the threshold 5.5 cm we'll consider operative repair at that point. Patient was also told that if he has an emergency room visit for back or abdominal pain he should let them know he has no abdominal aortic aneurysm.   Ruta Hinds, MD [facility] [place] Office: [phone] Pager: [phone]

## 2012-09-13 DIAGNOSIS — M5137 Other intervertebral disc degeneration, lumbosacral region: Secondary | ICD-10-CM | POA: Diagnosis not present

## 2012-09-15 DIAGNOSIS — M5137 Other intervertebral disc degeneration, lumbosacral region: Secondary | ICD-10-CM | POA: Diagnosis not present

## 2012-09-19 DIAGNOSIS — M5137 Other intervertebral disc degeneration, lumbosacral region: Secondary | ICD-10-CM | POA: Diagnosis not present

## 2012-09-21 DIAGNOSIS — M5137 Other intervertebral disc degeneration, lumbosacral region: Secondary | ICD-10-CM | POA: Diagnosis not present

## 2012-09-22 DIAGNOSIS — M5137 Other intervertebral disc degeneration, lumbosacral region: Secondary | ICD-10-CM | POA: Diagnosis not present

## 2012-09-26 DIAGNOSIS — M5137 Other intervertebral disc degeneration, lumbosacral region: Secondary | ICD-10-CM | POA: Diagnosis not present

## 2012-09-29 DIAGNOSIS — M5137 Other intervertebral disc degeneration, lumbosacral region: Secondary | ICD-10-CM | POA: Diagnosis not present

## 2012-10-03 DIAGNOSIS — M5137 Other intervertebral disc degeneration, lumbosacral region: Secondary | ICD-10-CM | POA: Diagnosis not present

## 2012-11-22 DIAGNOSIS — E789 Disorder of lipoprotein metabolism, unspecified: Secondary | ICD-10-CM | POA: Diagnosis not present

## 2012-11-29 DIAGNOSIS — R209 Unspecified disturbances of skin sensation: Secondary | ICD-10-CM | POA: Diagnosis not present

## 2012-11-29 DIAGNOSIS — Z23 Encounter for immunization: Secondary | ICD-10-CM | POA: Diagnosis not present

## 2012-11-29 DIAGNOSIS — L919 Hypertrophic disorder of the skin, unspecified: Secondary | ICD-10-CM | POA: Diagnosis not present

## 2012-11-29 DIAGNOSIS — M79609 Pain in unspecified limb: Secondary | ICD-10-CM | POA: Diagnosis not present

## 2012-11-29 DIAGNOSIS — L909 Atrophic disorder of skin, unspecified: Secondary | ICD-10-CM | POA: Diagnosis not present

## 2012-11-29 DIAGNOSIS — G589 Mononeuropathy, unspecified: Secondary | ICD-10-CM | POA: Diagnosis not present

## 2012-11-29 DIAGNOSIS — R0989 Other specified symptoms and signs involving the circulatory and respiratory systems: Secondary | ICD-10-CM | POA: Diagnosis not present

## 2012-11-29 DIAGNOSIS — R0609 Other forms of dyspnea: Secondary | ICD-10-CM | POA: Diagnosis not present

## 2012-11-29 DIAGNOSIS — I10 Essential (primary) hypertension: Secondary | ICD-10-CM | POA: Diagnosis not present

## 2012-12-22 DIAGNOSIS — E789 Disorder of lipoprotein metabolism, unspecified: Secondary | ICD-10-CM | POA: Diagnosis not present

## 2012-12-22 DIAGNOSIS — Z125 Encounter for screening for malignant neoplasm of prostate: Secondary | ICD-10-CM | POA: Diagnosis not present

## 2012-12-22 DIAGNOSIS — N189 Chronic kidney disease, unspecified: Secondary | ICD-10-CM | POA: Diagnosis not present

## 2012-12-22 DIAGNOSIS — E0789 Other specified disorders of thyroid: Secondary | ICD-10-CM | POA: Diagnosis not present

## 2012-12-22 DIAGNOSIS — I10 Essential (primary) hypertension: Secondary | ICD-10-CM | POA: Diagnosis not present

## 2012-12-29 DIAGNOSIS — I10 Essential (primary) hypertension: Secondary | ICD-10-CM | POA: Diagnosis not present

## 2012-12-29 DIAGNOSIS — R0609 Other forms of dyspnea: Secondary | ICD-10-CM | POA: Diagnosis not present

## 2012-12-29 DIAGNOSIS — E789 Disorder of lipoprotein metabolism, unspecified: Secondary | ICD-10-CM | POA: Diagnosis not present

## 2012-12-29 DIAGNOSIS — E0789 Other specified disorders of thyroid: Secondary | ICD-10-CM | POA: Diagnosis not present

## 2013-03-09 ENCOUNTER — Ambulatory Visit: Payer: Medicare Other | Admitting: Neurosurgery

## 2013-03-22 ENCOUNTER — Ambulatory Visit: Payer: Medicare Other | Admitting: Neurosurgery

## 2013-03-22 ENCOUNTER — Encounter (INDEPENDENT_AMBULATORY_CARE_PROVIDER_SITE_OTHER): Payer: Medicare Other | Admitting: *Deleted

## 2013-03-22 DIAGNOSIS — I714 Abdominal aortic aneurysm, without rupture: Secondary | ICD-10-CM | POA: Diagnosis not present

## 2013-03-22 DIAGNOSIS — E789 Disorder of lipoprotein metabolism, unspecified: Secondary | ICD-10-CM | POA: Diagnosis not present

## 2013-03-24 ENCOUNTER — Other Ambulatory Visit: Payer: Self-pay

## 2013-03-24 DIAGNOSIS — I714 Abdominal aortic aneurysm, without rupture: Secondary | ICD-10-CM

## 2013-03-27 ENCOUNTER — Encounter: Payer: Self-pay | Admitting: Vascular Surgery

## 2013-03-29 DIAGNOSIS — E789 Disorder of lipoprotein metabolism, unspecified: Secondary | ICD-10-CM | POA: Diagnosis not present

## 2013-03-29 DIAGNOSIS — I2510 Atherosclerotic heart disease of native coronary artery without angina pectoris: Secondary | ICD-10-CM | POA: Diagnosis not present

## 2013-03-29 DIAGNOSIS — R209 Unspecified disturbances of skin sensation: Secondary | ICD-10-CM | POA: Diagnosis not present

## 2013-03-29 DIAGNOSIS — E0789 Other specified disorders of thyroid: Secondary | ICD-10-CM | POA: Diagnosis not present

## 2013-03-29 DIAGNOSIS — Z79899 Other long term (current) drug therapy: Secondary | ICD-10-CM | POA: Diagnosis not present

## 2013-05-31 DIAGNOSIS — H2510 Age-related nuclear cataract, unspecified eye: Secondary | ICD-10-CM | POA: Diagnosis not present

## 2013-05-31 DIAGNOSIS — H524 Presbyopia: Secondary | ICD-10-CM | POA: Diagnosis not present

## 2013-06-21 DIAGNOSIS — E789 Disorder of lipoprotein metabolism, unspecified: Secondary | ICD-10-CM | POA: Diagnosis not present

## 2013-06-28 DIAGNOSIS — G609 Hereditary and idiopathic neuropathy, unspecified: Secondary | ICD-10-CM | POA: Diagnosis not present

## 2013-06-28 DIAGNOSIS — E0789 Other specified disorders of thyroid: Secondary | ICD-10-CM | POA: Diagnosis not present

## 2013-07-10 DIAGNOSIS — L219 Seborrheic dermatitis, unspecified: Secondary | ICD-10-CM | POA: Diagnosis not present

## 2013-07-10 DIAGNOSIS — D2360 Other benign neoplasm of skin of unspecified upper limb, including shoulder: Secondary | ICD-10-CM | POA: Diagnosis not present

## 2013-07-10 DIAGNOSIS — D235 Other benign neoplasm of skin of trunk: Secondary | ICD-10-CM | POA: Diagnosis not present

## 2013-07-17 ENCOUNTER — Ambulatory Visit (INDEPENDENT_AMBULATORY_CARE_PROVIDER_SITE_OTHER): Payer: Self-pay | Admitting: General Surgery

## 2013-07-28 ENCOUNTER — Encounter (INDEPENDENT_AMBULATORY_CARE_PROVIDER_SITE_OTHER): Payer: Self-pay | Admitting: General Surgery

## 2013-07-28 ENCOUNTER — Ambulatory Visit (INDEPENDENT_AMBULATORY_CARE_PROVIDER_SITE_OTHER): Payer: Medicare Other | Admitting: General Surgery

## 2013-07-28 VITALS — BP 128/71 | HR 77 | Temp 98.6°F | Resp 14 | Ht 69.0 in | Wt 229.2 lb

## 2013-07-28 DIAGNOSIS — Q8509 Other neurofibromatosis: Secondary | ICD-10-CM | POA: Diagnosis not present

## 2013-07-28 NOTE — Progress Notes (Signed)
Subjective:     Patient ID: Derek [name], male   DOB: 06-May-1935, 78 y.o.   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 The patient was seen by Dr. Allyn Kenner and referred for evaluation. I agree with this assessment these are neurofibromas. The patient states that these have been shaven off previously in the [employer] approximately 30 years ago but has since recurred. The patient mainly complains of tenderness to touch to one particular neurofibroma however is not affecting his lifestyle.  Review of Systems  Constitutional: Negative.   HENT: Negative.   Respiratory: Negative.   Cardiovascular: Negative.   Gastrointestinal: Negative.   Neurological: Negative.   All other systems reviewed and are negative.       Objective:   Physical Exam  Constitutional: He is oriented to person, place, and time. He appears well-developed and well-nourished.  HENT:  Head: Normocephalic and atraumatic.  Eyes: Conjunctivae are normal. Pupils are equal, round, and reactive to light.  Neck: Normal range of motion. Neck supple.  Cardiovascular: Normal rate, regular rhythm and normal heart sounds.   Pulmonary/Chest: Effort normal and breath sounds normal.  Abdominal: Soft. Bowel sounds are normal. There is no tenderness. There is no rebound and no guarding.  Musculoskeletal: Normal range of motion.  Neurological: He is alert and oriented to person, place, and time.  Skin: Skin is warm and dry.          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     Plan:     1. Discussed with patient the likelihood of these returning secondary to diagnosis is very high. Patient states he would rather not have excision of this area at this time secondary to the chance of recurrence. 2. I discussed the patient should these become more symptomatic he can call us back for rediscussion of  excision.

## 2013-09-27 ENCOUNTER — Ambulatory Visit: Payer: Medicare Other | Admitting: Vascular Surgery

## 2013-09-27 ENCOUNTER — Encounter: Payer: Self-pay | Admitting: Family

## 2013-09-27 ENCOUNTER — Other Ambulatory Visit: Payer: Medicare Other

## 2013-09-28 ENCOUNTER — Ambulatory Visit (INDEPENDENT_AMBULATORY_CARE_PROVIDER_SITE_OTHER): Payer: Medicare Other | Admitting: Family

## 2013-09-28 ENCOUNTER — Ambulatory Visit (HOSPITAL_COMMUNITY)
Admission: RE | Admit: 2013-09-28 | Discharge: 2013-09-28 | Disposition: A | Discharge status: Home / Self Care | Payer: Medicare Other | Source: Ambulatory Visit | Attending: Vascular Surgery | Admitting: Vascular Surgery

## 2013-09-28 ENCOUNTER — Encounter (INDEPENDENT_AMBULATORY_CARE_PROVIDER_SITE_OTHER): Payer: Self-pay

## 2013-09-28 ENCOUNTER — Encounter: Payer: Self-pay | Admitting: Family

## 2013-09-28 VITALS — BP 143/72 | HR 44 | Resp 14 | Ht 69.0 in | Wt 229.0 lb

## 2013-09-28 DIAGNOSIS — I714 Abdominal aortic aneurysm, without rupture, unspecified: Secondary | ICD-10-CM | POA: Insufficient documentation

## 2013-09-28 DIAGNOSIS — I7409 Other arterial embolism and thrombosis of abdominal aorta: Secondary | ICD-10-CM

## 2013-09-28 NOTE — Progress Notes (Signed)
[facility] [address]  Established Abdominal Aortic Aneurysm  History of Present Illness  Derek [name] is a 78 y.o. (03-27-1935) male patient that Dr. Oneida Alar has been following and presents today for evaluation of abdominal aortic aneurysm. Previous studies demonstrate an AAA, measuring 4.05 cm on 03/22/13.  The patient does have chronic back pain, his AAA was found as part of evaluation of his sciatica. He states occasional mild LLQ abdominal pain but no acute abdominal pain.  The patient is a former smoker, quit in 2001. He admits to being sedentary; encouraged him to do some moderate exercise at least 30 min./day, at least 5 days/week. The patient denies claudication in legs with walking, his sciatica is in his left leg. The patient reports in 1997 that he might have had a TIA manifested as unilateral facial drooping, drooling, feeling tired; he was evaluated at the time and does not know the results; no further TIA episodes.  He reports dyspnea with exertion, attributes to hx of smoking for 55 years, quit in 2001. He had 5 vessel CABG in 2007, states he is doing well enough that he no longer needs to see his cardiologist. He denies any new medical problems or surgeries since last seen in this office.   Pt Diabetic: No  Past Medical History  Diagnosis Date  . Coronary artery disease   . Hypertension   . Hypothyroidism   . Arthritis   . Myocardial infarction   . Sleep apnea     states mild . no cpap  . Shortness of breath     on exertion;  has productive cough at times  . COPD (chronic obstructive pulmonary disease)    Past Surgical History  Procedure Laterality Date  . Coronary artery bypass graft  2007  . Knee arthroscopy  02/26/2012    Procedure: ARTHROSCOPY KNEE;  Surgeon: Sydnee Cabal, MD;  Location: [facility];  Service: Orthopedics;  Laterality: Left;  medial menisectomy and chondraplasty  . Appendectomy  1963   Social History History    Social History  . Marital Status: Divorced    Spouse Name: N/A    Number of Children: N/A  . Years of Education: N/A   Occupational History  . Not on file.   Social History Main Topics  . Smoking status: Former Smoker    Types: Cigarettes    Quit date: 02/23/2000  . Smokeless tobacco: Never Used  . Alcohol Use: 0 - .6 oz/week    0-1 Cans of beer per week  . Drug Use: No  . Sexual Activity: Not on file   Other Topics Concern  . Not on file   Social History Narrative  . No narrative on file   Family History Family History  Problem Relation Age of Onset  . Diabetes Mother   . Heart disease Mother   . Diabetes Sister   . Diabetes Brother   . Hypertension Brother   . Heart attack Daughter     Current Outpatient Prescriptions on File Prior to Visit  Medication Sig Dispense Refill  . acetaminophen (TYLENOL) 325 MG tablet Take 650 mg by mouth every 4 (four) hours as needed.      Marland Kitchen amLODipine-valsartan (EXFORGE) 5-160 MG per tablet Take 1 tablet by mouth daily.      Marland Kitchen aspirin 81 MG tablet Take 81 mg by mouth daily.      . beta carotene w/minerals (OCUVITE) tablet Take 1 tablet by mouth daily.      Marland Kitchen  Choline Fenofibrate 135 MG capsule Take 135 mg by mouth daily.      Marland Kitchen desloratadine (CLARINEX) 5 MG tablet Take 5 mg by mouth as needed.      . fish oil-omega-3 fatty acids 1000 MG capsule Take 2 g by mouth 2 (two) times daily.      . Glucosamine-Chondroit-Vit C-Mn (GLUCOSAMINE 1500 COMPLEX) CAPS Take 1 capsule by mouth.      Marland Kitchen guaiFENesin (MUCINEX) 600 MG 12 hr tablet Take 1,200 mg by mouth as needed.      Marland Kitchen levothyroxine (SYNTHROID, LEVOTHROID) 75 MCG tablet Take 88 mcg by mouth daily.       . metoprolol succinate (TOPROL-XL) 25 MG 24 hr tablet Take 25 mg by mouth 2 (two) times daily.      . Multiple Vitamin (MULTIVITAMIN) tablet Take 1 tablet by mouth daily.      . Omega-3-acid Ethyl Esters (OMACOR PO) Take 1 g by mouth 4 (four) times daily.      . rosuvastatin (CRESTOR) 20  MG tablet Take 20 mg by mouth daily.      Marland Kitchen terazosin (HYTRIN) 10 MG capsule Take 10 mg by mouth 2 (two) times daily.      Marland Kitchen tiotropium (SPIRIVA) 18 MCG inhalation capsule Take 18 mcg by mouth daily.      . vitamin B-12 (CYANOCOBALAMIN) 1000 MCG tablet Take 1,000 mcg by mouth daily.       No current facility-administered medications on file prior to visit.   Allergies  Allergen Reactions  . Latex Rash  . Ointment, White Rash    Mycins ointment  . Tape Rash    Adhesive tape    ROS: [x]  Positive   [ ]  Negative   [ ]  All sytems reviewed and are negative  General: [ ]  Weight loss, [ ]  Fever, [ ]  chills Neurologic: [ ]  Dizziness, [ ]  Blackouts, [ ]  Seizure [ ]  Stroke, [ ]  "Mini stroke", [ ]  Slurred speech, [ ]  Temporary blindness; [ ]  weakness in arms or legs, [ ]  Hoarseness Cardiac: [ ]  Chest pain/pressure, [ ]  Shortness of breath at rest [ ]  Shortness of breath with exertion, [ ]  Atrial fibrillation or irregular heartbeat Vascular: [ ]  Pain in legs with walking, [ ]  Pain in legs at rest, [ ]  Pain in legs at night,  [ ]  Non-healing ulcer, [ ]  Blood clot in vein/DVT,   Pulmonary: [ ]  Home oxygen, [ ]  Productive cough, [ ]  Coughing up blood, [ ]  Asthma,  [ ]  Wheezing Musculoskeletal:  [ ]  Arthritis, [ ]  Low back pain, [ ]  Joint pain Hematologic: [ ]  Easy Bruising, [ ]  Anemia; [ ]  Hepatitis Gastrointestinal: [ ]  Blood in stool, [ ]  Gastroesophageal Reflux/heartburn, [ ]  Trouble swallowing Urinary: [ ]  chronic Kidney disease, [ ]  on HD - [ ]  MWF or [ ]  TTHS, [ ]  Burning with urination, [ ]  Difficulty urinating Skin: [ ]  Rashes, [ ]  Wounds Psychological: [ ]  Anxiety, [ ]  Depression  Physical Examination  Filed Vitals:   09/28/13 0925  BP: 143/72  Pulse: 44  Resp: 14   Filed Weights   09/28/13 0925  Weight: 229 lb (103.874 kg)   Body mass index is 33.8 kg/(m^2).  General: A&O x 3, WD, Obese.  Pulmonary: Sym exp, good air movt, CTAB, no rales, rhonchi, or wheezing.   Cardiac:  Sinus bradycardia, Nl S1, S2, no Murmurs, rubs or gallops.  Carotid Bruits Left Right   Negative  Negative  Aorta is not palpable Radial pulses are 2+ palpable                          VASCULAR EXAM:                                                                                                         LE Pulses LEFT RIGHT       FEMORAL   palpable  not palpable        POPLITEAL  not palpable   not palpable       POSTERIOR TIBIAL   palpable   not palpable        DORSALIS PEDIS      ANTERIOR TIBIAL  palpable   palpable      Gastrointestinal: soft, NTND, -G/R, - HSM, - masses, - CVAT B.  Musculoskeletal: M/S 5/5 throughout, Extremities without ischemic changes.   Neurologic: CN 2-12 intact  except slight left facial droop only when he smiles, Pain and light touch intact in extremities, Motor exam as listed above.  Non-Invasive Vascular Imaging  AAA Duplex (09/28/2013)  Previous size: 4.05 cm (Date: 03/22/13)  Current size:  3.84 x 3.5 cm (Date: 09/28/2013)  Medical Decision Making  The patient is a 78 y.o. male who presents with asymptomatic AAA with no increase in size.   Based on this patient's exam and diagnostic studies, and after discussing with Dr. Oneida Alar, the patient will follow up in 1 year  with the following studies: AAA Duplex.  The threshold for repair is AAA size > 5.5 cm, growth > 1 cm/yr, and symptomatic status.  I emphasized the importance of maximal medical management including strict control of blood pressure, blood glucose, and lipid levels, antiplatelet agents, obtaining regular exercise, and continued cessation of smoking.   The patient was given information about AAA including signs, symptoms, treatment, and how to minimize the risk of enlargement and rupture of aneurysms.    The patient was advised to call [phone] should the patient experience sudden onset abdominal or back pain.   Thank you for allowing [place] to participate in this patient's  care.  Clemon Chambers, RN, MSN, FNP-C [facility] [place] Office: [phone]  Clinic Physician: Oneida Alar  09/28/2013, 8:59 AM

## 2013-09-28 NOTE — Patient Instructions (Signed)
Abdominal Aortic Aneurysm  An aneurysm is the enlargement (dilatation), bulging, or ballooning out of part of the wall of a vein or artery. An aortic aneurysm is a bulging in the largest artery of the body. This artery supplies blood from the heart to the rest of the body.  The first part of the aorta is called the thoracic aorta. It leaves the heart, rises (ascends), arches, and goes down (descends) through the chest until it reaches the diaphragm. The diaphragm is the muscular part between the chest and abdomen.  The second part of the aorta is called the abdominal aorta after it has passed the diaphragm and continues down through the abdomen. The abdominal aorta ends where it splits to form the two iliac arteries that go to the legs. Aortic aneurysms can develop anywhere along the length of the aorta. The majority are located along the abdominal aorta. The major concern with an aortic aneurysm is that it can enlarge and rupture. This can cause death unless diagnosed and treated promptly. Aneurysms can also develop blood clots or infections. CAUSES  Many aortic aneurysms are caused by arteriosclerosis. Arteriosclerosis can weaken the aortic wall. The pressure of the blood being pumped through the aorta causes it to balloon out at the site of weakness. Therefore, high blood pressure (hypertension) is associated with aneurysm. Other risk factors include:  Age over 53.  Tobacco use.  Being male.  White race.  Family history of aneurysm.  Less frequent causes of abdominal aortic aneurysms include:  Connective tissue diseases.  Abdominal trauma.  Inflammation of blood vessles (arteritis).  Inherited (congenital) malformations.  Infection. SYMPTOMS  The signs and symptoms of an unruptured aneurysm will partly depend on its size and rate of growth.   Abdominal aortic aneurysms may cause pain. The pain typically has a deep quality as if it is piercing into the person. It is felt most  often in the lower back area. The pain is usually steady but may be relieved by changing your body position.  The person may also become aware of an abnormally prominent pulse in the belly (abdominal pulsation). DIAGNOSIS  An aortic aneurysm may be discovered by chance on physical exam, or on X-ray studies done for other reasons. It may be suspected because of other problems such as back or abdominal pain. The following tests may help identify the problem.  X-rays of the abdomen can show calcium deposits in the aneurysm wall.  CT scanning of the abdomen, particularly with contrast medium, is accurate at showing the exact size and shape of the aneurysm.  Ultrasounds give a clear picture of the size of an aneurysm (about 98% accuracy).  MRI scanning is accurate, but often unnecessary.  An abdominal angiogram shows the source of the major blood vessels arising from the aorta. It reveals the size and extent of any aneurysm. It can also show a clot clinging to the wall of the aneurysm (mural thrombus). TREATMENT  Treating an abdominal aortic aneurysm depends on the size. A rupture of an aneurysm is uncommon when they are less than 5 cm wide (2 inches). Rupture is far more common in aneurysms that are over 6 cm wide (2.4 inches).  Surgical repair is usually recommended for all aneurysms over 6 cm wide (2.4 inches). This depends on the health, age, and other circumstances of the individual. This type of surgery consists of opening the abdomen, removing the aneurysm, and sewing a synthetic graft (similar to a cloth tube) in its place. A  less invasive form of this surgery, using stent grafts, is sometimes recommended.  For most patients, elective repair is recommended for aneurysms between 4 and 6 cm (1.6 and 2.4 inches). Elective means the surgery can be done at your convenience. This should not be put off too long if surgery is recommended.  If you smoke, stop immediately. Smoking is a major risk  factor for enlargement and rupture.  Medications may be used to help decrease complications  these include medicine to lower blood pressure and control cholesterol. HOME CARE INSTRUCTIONS   If you smoke, stop. Do not start smoking.  Take all medications as prescribed.  Your caregiver will tell you when to have your aneurysm rechecked, either by ultrasound or CT scan.  If your caregiver has given you a follow-up appointment, it is very important to keep that appointment. Not keeping the appointment could result in a chronic or permanent injury, pain, or disability. If there is any problem keeping the appointment, you must call back to this facility for assistance. SEEK MEDICAL CARE IF:   You develop mild abdominal pain or pressure.  You are able to feel or perceive your aneurysm, and you sense any change. SEEK IMMEDIATE MEDICAL CARE IF:   You develop severe abdominal pain, or severe pain moving (radiating) to your back.  You suddenly develop cold or blue toes or feet.  You suddenly develop lightheadedness or fainting spells. MAKE SURE YOU:   Understand these instructions.  Will watch your condition.  Will get help right away if you are not doing well or get worse. Document Released: 09/16/2005 Document Revised: 02/29/2012 Document Reviewed: 07/10/2008 [facility] Patient Information 2014 [place].

## 2013-09-28 NOTE — Addendum Note (Signed)
Addended by: Mena Goes on: 09/28/2013 01:55 PM   Modules accepted: Orders

## 2013-10-23 DIAGNOSIS — Z23 Encounter for immunization: Secondary | ICD-10-CM | POA: Diagnosis not present

## 2013-12-26 DIAGNOSIS — M109 Gout, unspecified: Secondary | ICD-10-CM | POA: Diagnosis not present

## 2013-12-26 DIAGNOSIS — Z125 Encounter for screening for malignant neoplasm of prostate: Secondary | ICD-10-CM | POA: Diagnosis not present

## 2013-12-26 DIAGNOSIS — E0789 Other specified disorders of thyroid: Secondary | ICD-10-CM | POA: Diagnosis not present

## 2013-12-26 DIAGNOSIS — E789 Disorder of lipoprotein metabolism, unspecified: Secondary | ICD-10-CM | POA: Diagnosis not present

## 2013-12-26 DIAGNOSIS — I10 Essential (primary) hypertension: Secondary | ICD-10-CM | POA: Diagnosis not present

## 2014-01-01 DIAGNOSIS — I10 Essential (primary) hypertension: Secondary | ICD-10-CM | POA: Diagnosis not present

## 2014-01-01 DIAGNOSIS — E789 Disorder of lipoprotein metabolism, unspecified: Secondary | ICD-10-CM | POA: Diagnosis not present

## 2014-01-01 DIAGNOSIS — M19019 Primary osteoarthritis, unspecified shoulder: Secondary | ICD-10-CM | POA: Diagnosis not present

## 2014-01-01 DIAGNOSIS — N390 Urinary tract infection, site not specified: Secondary | ICD-10-CM | POA: Diagnosis not present

## 2014-01-01 DIAGNOSIS — E0789 Other specified disorders of thyroid: Secondary | ICD-10-CM | POA: Diagnosis not present

## 2014-01-01 DIAGNOSIS — M25519 Pain in unspecified shoulder: Secondary | ICD-10-CM | POA: Diagnosis not present

## 2014-01-01 DIAGNOSIS — Z2911 Encounter for prophylactic immunotherapy for respiratory syncytial virus (RSV): Secondary | ICD-10-CM | POA: Diagnosis not present

## 2014-01-03 DIAGNOSIS — M1A00X0 Idiopathic chronic gout, unspecified site, without tophus (tophi): Secondary | ICD-10-CM | POA: Diagnosis not present

## 2014-01-03 DIAGNOSIS — N139 Obstructive and reflux uropathy, unspecified: Secondary | ICD-10-CM | POA: Diagnosis not present

## 2014-01-03 DIAGNOSIS — M25519 Pain in unspecified shoulder: Secondary | ICD-10-CM | POA: Diagnosis not present

## 2014-01-03 DIAGNOSIS — M159 Polyosteoarthritis, unspecified: Secondary | ICD-10-CM | POA: Diagnosis not present

## 2014-01-03 DIAGNOSIS — R361 Hematospermia: Secondary | ICD-10-CM | POA: Diagnosis not present

## 2014-01-03 DIAGNOSIS — N401 Enlarged prostate with lower urinary tract symptoms: Secondary | ICD-10-CM | POA: Diagnosis not present

## 2014-01-03 DIAGNOSIS — N509 Disorder of male genital organs, unspecified: Secondary | ICD-10-CM | POA: Diagnosis not present

## 2014-03-08 DIAGNOSIS — M25519 Pain in unspecified shoulder: Secondary | ICD-10-CM | POA: Diagnosis not present

## 2014-03-08 DIAGNOSIS — M1A00X0 Idiopathic chronic gout, unspecified site, without tophus (tophi): Secondary | ICD-10-CM | POA: Diagnosis not present

## 2014-03-08 DIAGNOSIS — M159 Polyosteoarthritis, unspecified: Secondary | ICD-10-CM | POA: Diagnosis not present

## 2014-03-29 DIAGNOSIS — N400 Enlarged prostate without lower urinary tract symptoms: Secondary | ICD-10-CM | POA: Diagnosis not present

## 2014-03-29 DIAGNOSIS — R361 Hematospermia: Secondary | ICD-10-CM | POA: Diagnosis not present

## 2014-04-02 DIAGNOSIS — D696 Thrombocytopenia, unspecified: Secondary | ICD-10-CM | POA: Diagnosis not present

## 2014-05-02 DIAGNOSIS — E789 Disorder of lipoprotein metabolism, unspecified: Secondary | ICD-10-CM | POA: Diagnosis not present

## 2014-05-09 DIAGNOSIS — R0609 Other forms of dyspnea: Secondary | ICD-10-CM | POA: Diagnosis not present

## 2014-05-09 DIAGNOSIS — M109 Gout, unspecified: Secondary | ICD-10-CM | POA: Diagnosis not present

## 2014-05-09 DIAGNOSIS — N400 Enlarged prostate without lower urinary tract symptoms: Secondary | ICD-10-CM | POA: Diagnosis not present

## 2014-05-09 DIAGNOSIS — I10 Essential (primary) hypertension: Secondary | ICD-10-CM | POA: Diagnosis not present

## 2014-05-16 DIAGNOSIS — N453 Epididymo-orchitis: Secondary | ICD-10-CM | POA: Diagnosis not present

## 2014-05-17 DIAGNOSIS — M1A00X0 Idiopathic chronic gout, unspecified site, without tophus (tophi): Secondary | ICD-10-CM | POA: Diagnosis not present

## 2014-05-17 DIAGNOSIS — M159 Polyosteoarthritis, unspecified: Secondary | ICD-10-CM | POA: Diagnosis not present

## 2014-05-17 DIAGNOSIS — M25519 Pain in unspecified shoulder: Secondary | ICD-10-CM | POA: Diagnosis not present

## 2014-06-04 DIAGNOSIS — I2510 Atherosclerotic heart disease of native coronary artery without angina pectoris: Secondary | ICD-10-CM | POA: Diagnosis not present

## 2014-06-04 DIAGNOSIS — I10 Essential (primary) hypertension: Secondary | ICD-10-CM | POA: Diagnosis not present

## 2014-06-04 DIAGNOSIS — R55 Syncope and collapse: Secondary | ICD-10-CM | POA: Diagnosis not present

## 2014-06-05 DIAGNOSIS — I10 Essential (primary) hypertension: Secondary | ICD-10-CM | POA: Diagnosis not present

## 2014-06-05 DIAGNOSIS — J984 Other disorders of lung: Secondary | ICD-10-CM | POA: Diagnosis not present

## 2014-06-05 DIAGNOSIS — E0789 Other specified disorders of thyroid: Secondary | ICD-10-CM | POA: Diagnosis not present

## 2014-06-05 DIAGNOSIS — R55 Syncope and collapse: Secondary | ICD-10-CM | POA: Diagnosis not present

## 2014-06-06 ENCOUNTER — Other Ambulatory Visit: Payer: Self-pay | Admitting: *Deleted

## 2014-06-06 DIAGNOSIS — I2510 Atherosclerotic heart disease of native coronary artery without angina pectoris: Secondary | ICD-10-CM | POA: Diagnosis not present

## 2014-06-06 DIAGNOSIS — R001 Bradycardia, unspecified: Secondary | ICD-10-CM

## 2014-06-06 DIAGNOSIS — E789 Disorder of lipoprotein metabolism, unspecified: Secondary | ICD-10-CM | POA: Diagnosis not present

## 2014-06-06 DIAGNOSIS — R0609 Other forms of dyspnea: Secondary | ICD-10-CM | POA: Diagnosis not present

## 2014-06-06 DIAGNOSIS — R0989 Other specified symptoms and signs involving the circulatory and respiratory systems: Secondary | ICD-10-CM | POA: Diagnosis not present

## 2014-06-06 DIAGNOSIS — R55 Syncope and collapse: Secondary | ICD-10-CM

## 2014-06-06 DIAGNOSIS — E785 Hyperlipidemia, unspecified: Secondary | ICD-10-CM | POA: Diagnosis not present

## 2014-06-13 ENCOUNTER — Encounter: Payer: Self-pay | Admitting: *Deleted

## 2014-06-13 ENCOUNTER — Encounter (INDEPENDENT_AMBULATORY_CARE_PROVIDER_SITE_OTHER): Payer: Medicare Other

## 2014-06-13 DIAGNOSIS — R001 Bradycardia, unspecified: Secondary | ICD-10-CM

## 2014-06-13 DIAGNOSIS — R55 Syncope and collapse: Secondary | ICD-10-CM

## 2014-06-13 DIAGNOSIS — I495 Sick sinus syndrome: Secondary | ICD-10-CM

## 2014-06-13 NOTE — Progress Notes (Signed)
Patient ID: Derek [name], male   DOB: Aug 26, 1935, 79 y.o.   MRN: [id number] E-Cardio 24 hour holter monitor applied to patient.

## 2014-06-15 ENCOUNTER — Other Ambulatory Visit: Payer: Self-pay

## 2014-06-15 DIAGNOSIS — R55 Syncope and collapse: Secondary | ICD-10-CM

## 2014-07-24 ENCOUNTER — Encounter: Payer: Self-pay | Admitting: Interventional Cardiology

## 2014-07-24 ENCOUNTER — Ambulatory Visit (INDEPENDENT_AMBULATORY_CARE_PROVIDER_SITE_OTHER): Payer: Medicare Other | Admitting: Interventional Cardiology

## 2014-07-24 VITALS — BP 130/59 | HR 59 | Ht 69.0 in | Wt 244.0 lb

## 2014-07-24 DIAGNOSIS — R0609 Other forms of dyspnea: Secondary | ICD-10-CM

## 2014-07-24 DIAGNOSIS — R55 Syncope and collapse: Secondary | ICD-10-CM

## 2014-07-24 DIAGNOSIS — R001 Bradycardia, unspecified: Secondary | ICD-10-CM | POA: Insufficient documentation

## 2014-07-24 DIAGNOSIS — J42 Unspecified chronic bronchitis: Secondary | ICD-10-CM | POA: Diagnosis not present

## 2014-07-24 DIAGNOSIS — R0989 Other specified symptoms and signs involving the circulatory and respiratory systems: Secondary | ICD-10-CM

## 2014-07-24 DIAGNOSIS — R42 Dizziness and giddiness: Secondary | ICD-10-CM | POA: Insufficient documentation

## 2014-07-24 DIAGNOSIS — R0600 Dyspnea, unspecified: Secondary | ICD-10-CM

## 2014-07-24 DIAGNOSIS — I25810 Atherosclerosis of coronary artery bypass graft(s) without angina pectoris: Secondary | ICD-10-CM

## 2014-07-24 DIAGNOSIS — J449 Chronic obstructive pulmonary disease, unspecified: Secondary | ICD-10-CM | POA: Insufficient documentation

## 2014-07-24 MED ORDER — METOPROLOL SUCCINATE ER 25 MG PO TB24: 12.5000 mg | ORAL_TABLET | Freq: Every day | ORAL | Status: DC

## 2014-07-24 NOTE — Patient Instructions (Addendum)
Your physician has recommended you make the following change in your medication:  1) DECREASE Metoprolol to 12.5mg  daily for 2 weeks then STOP  Your physician has recommended that you wear a holter monitor. Holter monitors are medical devices that record the heart's electrical activity. Doctors most often use these monitors to diagnose arrhythmias. Arrhythmias are problems with the speed or rhythm of the heartbeat. The monitor is a small, portable device. You can wear one while you do your normal daily activities. This is usually used to diagnose what is causing palpitations/syncope (passing out).( TO BE WORN 2 WEEKS AFTER STOPPING METOPROLOL) to be scheduled for 1 month  Your physician has requested that you have an echocardiogram. Echocardiography is a painless test that uses sound waves to create images of your heart. It provides your doctor with information about the size and shape of your heart and how well your heart's chambers and valves are working. This procedure takes approximately one hour. There are no restrictions for this procedure.   Your physician recommends that you schedule a follow-up appointment in: 6 weeks

## 2014-07-24 NOTE — Progress Notes (Signed)
Patient ID: Derek [name], male   DOB: 1935/08/14, 79 y.o.   MRN: [id number]   Date: 07/24/2014 ID: Derek [name], DOB Feb 22, 1935, MRN [id number] PCP: Dwan Bolt, MD  Reason: Recurrent syncope  ASSESSMENT;  1. Recurrent syncope 2. Bradycardia and AV block 3. Coronary artery disease with prior bypass, 2007 4. Dyspnea of uncertain cause 5. COPD  PLAN:  1. 2-D Doppler echocardiogram to help determine whether there is a cardiac etiology of dyspnea. 2. Wean and DC beta blocker therapy and repeat Holter monitor 2 weeks after beta blockers have been completely discontinued. 3. Clinical followup in 4-6 weeks.   SUBJECTIVE: Derek [name] is a 79 y.o. male who is here for evaluation of syncope. 6 weeks ago and again 4 weeks ago he had loss of consciousness. The first episode occurred while he was standing in his kitchen. He was coughing and suddenly couldn't breathe. Shortly thereafter he fainted. He believes he was out for only a short time. There was no palpitation, chest pain, or other premonitory complaint. The second episode occurred 2 weeks later and started as feeling dizzy while sitting. He continued to struggle to get up and when he stood, he fainted. This caused injury of his left arm and side. He did not hit his head. After these 2 episodes he saw Dr. Vernie Ammons who identified bradycardia and decreased metoprolol. A Holter monitor was subsequently performed that revealed evidence of persistent bradycardia despite the reduction in metoprolol dose to 25 mg per day. The average heart rate on the monitor was 56 beats per minute.. Since the dose of metoprolol has been reduced he feels better. He is not as often dizzy. He has had frequent episodes of dizziness as more like vertigo. He denies chest pain. He has dyspnea on exertion. He also has orthopnea. There is no peripheral edema. He denies angina.  He has history of coronary artery disease with prior bypass surgery in 2007. He was previously  followed by Dr. Lia Foyer and has not seen a cardiologist now in quite some time. He no longer smokes. He does have COPD and fibrosis on his chest x-ray.   Allergies  Allergen Reactions  . Latex Rash  . Ointment, White Rash    Mycins ointment and pills  . Tape Rash    Adhesive tape    Current Outpatient Prescriptions on File Prior to Visit  Medication Sig Dispense Refill  . acetaminophen (TYLENOL) 325 MG tablet Take 650 mg by mouth every 4 (four) hours as needed.      Marland Kitchen amLODipine-valsartan (EXFORGE) 5-160 MG per tablet Take 1 tablet by mouth daily.      Marland Kitchen aspirin 81 MG tablet Take 81 mg by mouth daily.      . beta carotene w/minerals (OCUVITE) tablet Take 1 tablet by mouth daily.      . Biotin (BIOTIN 5000) 5 MG CAPS Take by mouth daily.      . Choline Fenofibrate 135 MG capsule Take 135 mg by mouth daily.      . clobetasol (TEMOVATE) 0.05 % external solution Apply 1 application topically 2 (two) times daily.      Marland Kitchen desloratadine (CLARINEX) 5 MG tablet Take 5 mg by mouth as needed.      . fish oil-omega-3 fatty acids 1000 MG capsule Take 2 g by mouth 2 (two) times daily.      . Glucosamine-Chondroit-Vit C-Mn (GLUCOSAMINE 1500 COMPLEX) CAPS Take 1 capsule by mouth.      Marland Kitchen guaiFENesin ([place])  600 MG 12 hr tablet Take 1,200 mg by mouth as needed.      Marland Kitchen levothyroxine (SYNTHROID, LEVOTHROID) 75 MCG tablet Take 88 mcg by mouth daily.       . metoprolol succinate (TOPROL-XL) 25 MG 24 hr tablet Take 25 mg by mouth 2 (two) times daily.      . Multiple Vitamin (MULTIVITAMIN) tablet Take 1 tablet by mouth daily.      . Omega-3-acid Ethyl Esters (OMACOR PO) Take 1 g by mouth 4 (four) times daily.      . rosuvastatin (CRESTOR) 20 MG tablet Take 20 mg by mouth daily.      . sildenafil (VIAGRA) 25 MG tablet Take 25 mg by mouth as needed for erectile dysfunction.      Marland Kitchen terazosin (HYTRIN) 10 MG capsule Take 10 mg by mouth 2 (two) times daily.      Marland Kitchen tiotropium (SPIRIVA) 18 MCG inhalation capsule  Take 18 mcg by mouth daily.      . vitamin B-12 (CYANOCOBALAMIN) 1000 MCG tablet Take 1,000 mcg by mouth daily.       No current facility-administered medications on file prior to visit.    Past Medical History  Diagnosis Date  . Coronary artery disease   . Hypertension   . Hypothyroidism   . Arthritis   . Myocardial infarction   . Sleep apnea     states mild . no cpap  . Shortness of breath     on exertion;  has productive cough at times  . COPD (chronic obstructive pulmonary disease)   . AAA (abdominal aortic aneurysm)     Past Surgical History  Procedure Laterality Date  . Coronary artery bypass graft  2007  . Knee arthroscopy  02/26/2012    Procedure: ARTHROSCOPY KNEE;  Surgeon: Sydnee Cabal, MD;  Location: [facility];  Service: Orthopedics;  Laterality: Left;  medial menisectomy and chondraplasty  . Appendectomy  1963    History   Social History  . Marital Status: Divorced    Spouse Name: N/A    Number of Children: N/A  . Years of Education: N/A   Occupational History  . Not on file.   Social History Main Topics  . Smoking status: Former Smoker    Types: Cigarettes    Quit date: 02/23/2000  . Smokeless tobacco: Never Used  . Alcohol Use: 0.0 - 0.6 oz/week    0-1 Cans of beer per week  . Drug Use: No  . Sexual Activity: Not on file   Other Topics Concern  . Not on file   Social History Narrative  . No narrative on file    Family History  Problem Relation Age of Onset  . Diabetes Mother   . Heart disease Mother   . Diabetes Sister   . Diabetes Brother   . Hypertension Brother   . Heart attack Daughter     ROS: He denies palpitations. No history of stroke or TIA. He is not have peptic ulcer disease or intestinal bleeding. He denies prior heart attack. He is not diabetic. He does have high blood pressure and is compliant with whatever therapy the doctor's prescribed period the med list states that he is on 25 mg of metoprolol twice a  day but he corrects me that he takes it only once daily. Other systems negative for complaints.  OBJECTIVE: BP 130/59  Pulse 59  Ht 5\' 9"  (1.753 m)  Wt 244 lb (110.678 kg)  BMI 36.02 kg/m2,  General: No acute distress, obese and appears his stated age 29: normal without pallor jaundice Neck: JVD flat. Carotids no bruits. Upstroke is normal bilaterally Chest: Basilar faint rales are heard Cardiac: Murmur: None. Gallop: S4. Rhythm: Normal. Other: Normal Abdomen: Bruit: Absent. Pulsation: Not palpable Extremities: Edema: Absent. Pulses: 2+ Neuro: Normal Psych: Decreased hearing  ECG: Normal sinus rhythm, vertical axis, old inferior infarct. No prior tracing to compare. First-degree AV block with PR of 300 msec

## 2014-07-31 ENCOUNTER — Ambulatory Visit (HOSPITAL_COMMUNITY): Payer: Medicare Other | Attending: Interventional Cardiology | Admitting: Radiology

## 2014-07-31 ENCOUNTER — Other Ambulatory Visit (HOSPITAL_COMMUNITY): Payer: Medicare Other

## 2014-07-31 DIAGNOSIS — Z87891 Personal history of nicotine dependence: Secondary | ICD-10-CM | POA: Diagnosis not present

## 2014-07-31 DIAGNOSIS — I252 Old myocardial infarction: Secondary | ICD-10-CM | POA: Insufficient documentation

## 2014-07-31 DIAGNOSIS — J4489 Other specified chronic obstructive pulmonary disease: Secondary | ICD-10-CM | POA: Insufficient documentation

## 2014-07-31 DIAGNOSIS — G4730 Sleep apnea, unspecified: Secondary | ICD-10-CM | POA: Diagnosis not present

## 2014-07-31 DIAGNOSIS — R0602 Shortness of breath: Secondary | ICD-10-CM | POA: Insufficient documentation

## 2014-07-31 DIAGNOSIS — R0600 Dyspnea, unspecified: Secondary | ICD-10-CM

## 2014-07-31 DIAGNOSIS — J449 Chronic obstructive pulmonary disease, unspecified: Secondary | ICD-10-CM | POA: Diagnosis not present

## 2014-07-31 DIAGNOSIS — I2510 Atherosclerotic heart disease of native coronary artery without angina pectoris: Secondary | ICD-10-CM | POA: Insufficient documentation

## 2014-07-31 DIAGNOSIS — E669 Obesity, unspecified: Secondary | ICD-10-CM | POA: Diagnosis not present

## 2014-07-31 DIAGNOSIS — E039 Hypothyroidism, unspecified: Secondary | ICD-10-CM | POA: Diagnosis not present

## 2014-07-31 DIAGNOSIS — I10 Essential (primary) hypertension: Secondary | ICD-10-CM | POA: Insufficient documentation

## 2014-07-31 NOTE — Progress Notes (Signed)
Echocardiogram performed.  

## 2014-08-16 ENCOUNTER — Telehealth: Payer: Self-pay

## 2014-08-16 NOTE — Telephone Encounter (Signed)
pt aware of echo results.Mod bi-atrial enlargement. Mild diastolic dysfunction.pt verbalized understanding.

## 2014-08-16 NOTE — Telephone Encounter (Signed)
Message copied by Lamar Laundry on Thu Aug 16, 2014  1:33 PM ------      Message from: Daneen Schick      Created: Fri Aug 10, 2014 10:17 PM       Mod bi-atrial enlargement. Mild diastolic dysfunction ------

## 2014-08-24 ENCOUNTER — Encounter: Payer: Self-pay | Admitting: Radiology

## 2014-08-24 ENCOUNTER — Encounter (INDEPENDENT_AMBULATORY_CARE_PROVIDER_SITE_OTHER): Payer: Medicare Other

## 2014-08-24 DIAGNOSIS — R55 Syncope and collapse: Secondary | ICD-10-CM

## 2014-08-24 DIAGNOSIS — R0600 Dyspnea, unspecified: Secondary | ICD-10-CM

## 2014-08-24 DIAGNOSIS — I25810 Atherosclerosis of coronary artery bypass graft(s) without angina pectoris: Secondary | ICD-10-CM

## 2014-08-24 NOTE — Progress Notes (Signed)
Patient ID: Derek [name], male   DOB: [birth year], 79 y.o.   MRN: [id number] E cardio 24hr holter applied

## 2014-09-03 DIAGNOSIS — I10 Essential (primary) hypertension: Secondary | ICD-10-CM | POA: Diagnosis not present

## 2014-09-07 ENCOUNTER — Ambulatory Visit (INDEPENDENT_AMBULATORY_CARE_PROVIDER_SITE_OTHER): Payer: Medicare Other | Admitting: Interventional Cardiology

## 2014-09-07 ENCOUNTER — Encounter: Payer: Self-pay | Admitting: Interventional Cardiology

## 2014-09-07 VITALS — BP 116/60 | HR 64 | Ht 69.0 in | Wt 238.0 lb

## 2014-09-07 DIAGNOSIS — I25810 Atherosclerosis of coronary artery bypass graft(s) without angina pectoris: Secondary | ICD-10-CM | POA: Diagnosis not present

## 2014-09-07 DIAGNOSIS — I4430 Unspecified atrioventricular block: Secondary | ICD-10-CM | POA: Diagnosis not present

## 2014-09-07 DIAGNOSIS — R42 Dizziness and giddiness: Secondary | ICD-10-CM | POA: Diagnosis not present

## 2014-09-07 DIAGNOSIS — R55 Syncope and collapse: Secondary | ICD-10-CM | POA: Diagnosis not present

## 2014-09-07 NOTE — Progress Notes (Signed)
Patient ID: Derek [name], male   DOB: 1935/03/11, 79 y.o.   MRN: [id number]    [address], [place]  [address] Phone: [phone] Fax:  [phone]  Date:  09/07/2014   ID:  Derek [name], DOB [birth year], MRN [id number]  PCP:  Dwan Bolt, MD   ASSESSMENT:  1. A-V block has resolved off beta blocker therapy. This was documented by a normal 24-hour Holter that showed a heart rate ranging between 46 and 104 beats per minute with average heart rate of 63 beats per minute without pauses or AV block 2. Recurring episodes of profound dizziness associated with a spinning feeling compatible with vertigo 3. Hypertension, controlled  PLAN:  1. Exertional fatigue has improved off beta blocker therapy and they have been no recurrences of loss of consciousness. It seems highly likely these episodes were related to high-grade A-V block (presumed) 2. Recurring episodes of spinning dizziness need ENT/neuro evaluation 3. No further cardiac evaluation at this time, unless recurrent true syncope 4. We'll avoid medications block AV node conduction such as beta blockers and non-dihydropyridine calcium channel blockers   SUBJECTIVE: Derek [name] is a 79 y.o. male who is still having episodes of dizziness when he goes from lying to sitting or if he bends over and becomes upright suddenly. Also if he looks returns suddenly he gets a spinning vertiginous type dizziness. He has not had syncope. He denies dyspnea, chest pain, edema, orthopnea, and PND. Recent cardiac evaluation included a relatively normal echocardiogram with normal LV function. 24-hour Holter monitor after discontinuing beta blocker therapy was normal.   Wt Readings from Last 3 Encounters:  09/07/14 238 lb (107.956 kg)  07/24/14 244 lb (110.678 kg)  09/28/13 229 lb (103.874 kg)     Past Medical History  Diagnosis Date  . Coronary artery disease   . Hypertension   . Hypothyroidism   . Arthritis   .  Myocardial infarction   . Sleep apnea     states mild . no cpap  . Shortness of breath     on exertion;  has productive cough at times  . COPD (chronic obstructive pulmonary disease)   . AAA (abdominal aortic aneurysm)     Current Outpatient Prescriptions  Medication Sig Dispense Refill  . acetaminophen (TYLENOL) 325 MG tablet Take 650 mg by mouth every 4 (four) hours as needed.      Marland Kitchen amLODipine-valsartan (EXFORGE) 5-160 MG per tablet Take 1 tablet by mouth daily.      Marland Kitchen aspirin 81 MG tablet Take 81 mg by mouth daily.      . beta carotene w/minerals (OCUVITE) tablet Take 1 tablet by mouth daily.      . Biotin (BIOTIN 5000) 5 MG CAPS Take by mouth daily.      . clobetasol (TEMOVATE) 0.05 % external solution Apply 1 application topically 2 (two) times daily.      Marland Kitchen desloratadine (CLARINEX) 5 MG tablet Take 5 mg by mouth as needed.      . fish oil-omega-3 fatty acids 1000 MG capsule Take 2 g by mouth 2 (two) times daily.      . Glucosamine-Chondroit-Vit C-Mn (GLUCOSAMINE 1500 COMPLEX) CAPS Take 1 capsule by mouth.      Marland Kitchen guaiFENesin (MUCINEX) 600 MG 12 hr tablet Take 1,200 mg by mouth as needed.      Marland Kitchen levothyroxine (SYNTHROID, LEVOTHROID) 75 MCG tablet Take 88 mcg by mouth daily.       Marland Kitchen  Multiple Vitamin (MULTIVITAMIN) tablet Take 1 tablet by mouth daily.      . Omega-3-acid Ethyl Esters (OMACOR PO) Take 1 g by mouth 4 (four) times daily.      . pregabalin ([place]) 50 MG capsule Take 50 mg by mouth 2 (two) times daily.      . rosuvastatin (CRESTOR) 20 MG tablet Take 20 mg by mouth daily.      . sildenafil (VIAGRA) 25 MG tablet Take 25 mg by mouth as needed for erectile dysfunction.      Marland Kitchen terazosin (HYTRIN) 10 MG capsule Take 10 mg by mouth 2 (two) times daily.      Marland Kitchen tiotropium (SPIRIVA) 18 MCG inhalation capsule Take 18 mcg by mouth daily.      . vitamin B-12 (CYANOCOBALAMIN) 1000 MCG tablet Take 1,000 mcg by mouth daily.       No current facility-administered medications for this  visit.    Allergies:    Allergies  Allergen Reactions  . Latex Rash  . Ointment, White Rash    Mycins ointment and pills  . Tape Rash    Adhesive tape    Social History:  The patient  reports that he quit smoking about 14 years ago. His smoking use included Cigarettes. He smoked 0.00 packs per day. He has never used smokeless tobacco. He reports that he drinks alcohol. He reports that he does not use illicit drugs.   ROS:  Please see the history of present illness.   No history of stroke. Has had difficulty with a spinning dizziness for years. He has chronic ringing in his ears.   All other systems reviewed and negative.   OBJECTIVE: VS:  BP 116/60  Pulse 64  Ht 5\' 9"  (1.753 m)  Wt 238 lb (107.956 kg)  BMI 35.13 kg/m2 Well nourished, well developed, in no acute distress, elderly and obese HEENT: normal Neck: JVD flat. Carotid bruit absent  Cardiac:  normal S1, S2; RRR; no murmur Lungs:  clear to auscultation bilaterally, no wheezing, rhonchi or rales Abd: soft, nontender, no hepatomegaly Ext: Edema absent. Pulses 2+ Skin: warm and dry Neuro:  CNs 2-12 intact, no focal abnormalities noted  EKG:  Not performed       Signed, Illene Labrador III, MD 09/07/2014 4:26 PM

## 2014-09-07 NOTE — Patient Instructions (Signed)
Your physician recommends that you continue on your current medications as directed. Please refer to the Current Medication list given to you today.  Follow up with your primary care physician Dr.Kohut about your dizziness  Your physician recommends that you schedule a follow-up appointment as needed

## 2014-09-10 DIAGNOSIS — Z23 Encounter for immunization: Secondary | ICD-10-CM | POA: Diagnosis not present

## 2014-09-10 DIAGNOSIS — R42 Dizziness and giddiness: Secondary | ICD-10-CM | POA: Diagnosis not present

## 2014-09-10 DIAGNOSIS — M109 Gout, unspecified: Secondary | ICD-10-CM | POA: Diagnosis not present

## 2014-09-10 DIAGNOSIS — E789 Disorder of lipoprotein metabolism, unspecified: Secondary | ICD-10-CM | POA: Diagnosis not present

## 2014-09-17 DIAGNOSIS — M159 Polyosteoarthritis, unspecified: Secondary | ICD-10-CM | POA: Diagnosis not present

## 2014-09-17 DIAGNOSIS — M25519 Pain in unspecified shoulder: Secondary | ICD-10-CM | POA: Diagnosis not present

## 2014-09-17 DIAGNOSIS — M1A00X0 Idiopathic chronic gout, unspecified site, without tophus (tophi): Secondary | ICD-10-CM | POA: Diagnosis not present

## 2014-09-19 ENCOUNTER — Telehealth: Payer: Self-pay

## 2014-09-19 DIAGNOSIS — R269 Unspecified abnormalities of gait and mobility: Secondary | ICD-10-CM | POA: Diagnosis not present

## 2014-09-19 DIAGNOSIS — R42 Dizziness and giddiness: Secondary | ICD-10-CM | POA: Diagnosis not present

## 2014-09-19 DIAGNOSIS — H8110 Benign paroxysmal vertigo, unspecified ear: Secondary | ICD-10-CM | POA: Diagnosis not present

## 2014-09-19 NOTE — Telephone Encounter (Signed)
pt aware of Holter Monitor results -Normal pt verbalized understanding.

## 2014-09-28 ENCOUNTER — Encounter: Payer: Self-pay | Admitting: Family

## 2014-10-01 ENCOUNTER — Encounter: Payer: Self-pay | Admitting: Family

## 2014-10-01 ENCOUNTER — Ambulatory Visit (HOSPITAL_COMMUNITY)
Admission: RE | Admit: 2014-10-01 | Discharge: 2014-10-01 | Disposition: A | Discharge status: Home / Self Care | Payer: Medicare Other | Source: Ambulatory Visit | Attending: Family | Admitting: Family

## 2014-10-01 ENCOUNTER — Ambulatory Visit (INDEPENDENT_AMBULATORY_CARE_PROVIDER_SITE_OTHER): Payer: Medicare Other | Admitting: Family

## 2014-10-01 VITALS — BP 138/72 | HR 53 | Resp 14 | Ht 69.0 in | Wt 238.0 lb

## 2014-10-01 DIAGNOSIS — I714 Abdominal aortic aneurysm, without rupture, unspecified: Secondary | ICD-10-CM

## 2014-10-01 DIAGNOSIS — I25810 Atherosclerosis of coronary artery bypass graft(s) without angina pectoris: Secondary | ICD-10-CM

## 2014-10-01 NOTE — Addendum Note (Signed)
Addended by: Mena Goes on: 10/01/2014 05:48 PM   Modules accepted: Orders

## 2014-10-01 NOTE — Patient Instructions (Signed)
Abdominal Aortic Aneurysm An aneurysm is a weakened or damaged part of an artery wall that bulges from the normal force of blood pumping through the body. An abdominal aortic aneurysm is an aneurysm that occurs in the lower part of the aorta, the main artery of the body.  The major concern with an abdominal aortic aneurysm is that it can enlarge and burst (rupture) or blood can flow between the layers of the wall of the aorta through a tear (aorticdissection). Both of these conditions can cause bleeding inside the body and can be life threatening unless diagnosed and treated promptly. CAUSES  The exact cause of an abdominal aortic aneurysm is unknown. Some contributing factors are:   A hardening of the arteries caused by the buildup of fat and other substances in the lining of a blood vessel (arteriosclerosis).  Inflammation of the walls of an artery (arteritis).   Connective tissue diseases, such as Marfan syndrome.   Abdominal trauma.   An infection, such as syphilis or staphylococcus, in the wall of the aorta (infectious aortitis) caused by bacteria. RISK FACTORS  Risk factors that contribute to an abdominal aortic aneurysm may include:  Age older than 60 years.   High blood pressure (hypertension).  Male gender.  Ethnicity (white race).  Obesity.  Family history of aneurysm (first degree relatives only).  Tobacco use. PREVENTION  The following healthy lifestyle habits may help decrease your risk of abdominal aortic aneurysm:  Quitting smoking. Smoking can raise your blood pressure and cause arteriosclerosis.  Limiting or avoiding alcohol.  Keeping your blood pressure, blood sugar level, and cholesterol levels within normal limits.  Decreasing your salt intake. In somepeople, too much salt can raise blood pressure and increase your risk of abdominal aortic aneurysm.  Eating a diet low in saturated fats and cholesterol.  Increasing your fiber intake by including  whole grains, vegetables, and fruits in your diet. Eating these foods may help lower blood pressure.  Maintaining a healthy weight.  Staying physically active and exercising regularly. SYMPTOMS  The symptoms of abdominal aortic aneurysm may vary depending on the size and rate of growth of the aneurysm.Most grow slowly and do not have any symptoms. When symptoms do occur, they may include:  Pain (abdomen, side, lower back, or groin). The pain may vary in intensity. A sudden onset of severe pain may indicate that the aneurysm has ruptured.  Feeling full after eating only small amounts of food.  Nausea or vomiting or both.  Feeling a pulsating lump in the abdomen.  Feeling faint or passing out. DIAGNOSIS  Since most unruptured abdominal aortic aneurysms have no symptoms, they are often discovered during diagnostic exams for other conditions. An aneurysm may be found during the following procedures:  Ultrasonography (A one-time screening for abdominal aortic aneurysm by ultrasonography is also recommended for all men aged 65-75 years who have ever smoked).  X-ray exams.  A computed tomography (CT).  Magnetic resonance imaging (MRI).  Angiography or arteriography. TREATMENT  Treatment of an abdominal aortic aneurysm depends on the size of your aneurysm, your age, and risk factors for rupture. Medication to control blood pressure and pain may be used to manage aneurysms smaller than 6 cm. Regular monitoring for enlargement may be recommended by your caregiver if:  The aneurysm is 3-4 cm in size (an annual ultrasonography may be recommended).  The aneurysm is 4-4.5 cm in size (an ultrasonography every 6 months may be recommended).  The aneurysm is larger than 4.5 cm in   size (your caregiver may ask that you be examined by a vascular surgeon). If your aneurysm is larger than 6 cm, surgical repair may be recommended. There are two main methods for repair of an aneurysm:   Endovascular  repair (a minimally invasive surgery). This is done most often.  Open repair. This method is used if an endovascular repair is not possible. Document Released: 09/16/2005 Document Revised: 04/03/2013 Document Reviewed: 01/06/2013 [employer] Patient Information 2015 [employer]. This information is not intended to replace advice given to you by your health care provider. Make sure you discuss any questions you have with your health care provider.  

## 2014-10-01 NOTE — Progress Notes (Signed)
[facility] [place]  Established Abdominal Aortic Aneurysm  History of Present Illness  Derek [name] is a 79 y.o. ([birth year]) male patient that Dr. Oneida Alar has been following and presents today for evaluation of abdominal aortic aneurysm.  Previous studies demonstrate an AAA, measuring 3.84 cm on 09/28/13. The patient does have chronic back pain, his AAA was found as part of evaluation of his sciatica. He states occasional mild LLQ abdominal pain but no acute abdominal pain. The patient is a former smoker, quit in 2001.  He admits to being sedentary; encouraged him to do some moderate exercise at least 30 min./day, at least 5 days/week.  The patient denies claudication in legs with walking, his sciatica is in his left leg which is relieved by Lyrica.  The patient reports in 1997 that he might have had a TIA manifested as unilateral facial drooping, drooling, feeling tired; he was evaluated at the time and does not know the results; no further TIA episodes.  He reports dyspnea with exertion, attributes to hx of smoking for 55 years, quit in 2001; this is his biggest barrier to walking or physical activity..  He had 5 vessel CABG in 2007, states he is doing well enough that he no longer needs to see his cardiologist.  He denies any new medical problems or surgeries since last seen in this office.   He takes a daily 81 mg ASA, daily statin, and no other antiplatelet agents nor anticoagulants.  Pt Diabetic: No   Past Medical History  Diagnosis Date  . Coronary artery disease   . Hypertension   . Hypothyroidism   . Arthritis   . Myocardial infarction   . Sleep apnea     states mild . no cpap  . Shortness of breath     on exertion;  has productive cough at times  . COPD (chronic obstructive pulmonary disease)   . AAA (abdominal aortic aneurysm)    Past Surgical History  Procedure Laterality Date  . Coronary artery bypass graft  2007  . Knee arthroscopy  02/26/2012     Procedure: ARTHROSCOPY KNEE;  Surgeon: Sydnee Cabal, MD;  Location: [facility];  Service: Orthopedics;  Laterality: Left;  medial menisectomy and chondraplasty  . Appendectomy  1963   Social History History   Social History  . Marital Status: Divorced    Spouse Name: N/A    Number of Children: N/A  . Years of Education: N/A   Occupational History  . Not on file.   Social History Main Topics  . Smoking status: Former Smoker    Types: Cigarettes    Quit date: 02/23/2000  . Smokeless tobacco: Never Used  . Alcohol Use: 0.0 - 0.6 oz/week    0-1 Cans of beer per week  . Drug Use: No  . Sexual Activity: Not on file   Other Topics Concern  . Not on file   Social History Narrative  . No narrative on file   Family History Family History  Problem Relation Age of Onset  . Diabetes Mother   . Heart disease Mother   . Diabetes Sister   . Diabetes Brother   . Hypertension Brother   . Heart attack Daughter     Current Outpatient Prescriptions on File Prior to Visit  Medication Sig Dispense Refill  . acetaminophen (TYLENOL) 325 MG tablet Take 650 mg by mouth every 4 (four) hours as needed.      Marland Kitchen amLODipine-valsartan (EXFORGE) 5-160 MG  per tablet Take 1 tablet by mouth daily.      Marland Kitchen aspirin 81 MG tablet Take 81 mg by mouth daily.      . beta carotene w/minerals (OCUVITE) tablet Take 1 tablet by mouth daily.      . Biotin (BIOTIN 5000) 5 MG CAPS Take by mouth daily.      . clobetasol (TEMOVATE) 0.05 % external solution Apply 1 application topically 2 (two) times daily.      Marland Kitchen desloratadine (CLARINEX) 5 MG tablet Take 5 mg by mouth as needed.      . fish oil-omega-3 fatty acids 1000 MG capsule Take 2 g by mouth 2 (two) times daily.      . Glucosamine-Chondroit-Vit C-Mn (GLUCOSAMINE 1500 COMPLEX) CAPS Take 1 capsule by mouth.      Marland Kitchen guaiFENesin (MUCINEX) 600 MG 12 hr tablet Take 1,200 mg by mouth as needed.      Marland Kitchen levothyroxine (SYNTHROID, LEVOTHROID) 75 MCG  tablet Take 88 mcg by mouth daily.       . Multiple Vitamin (MULTIVITAMIN) tablet Take 1 tablet by mouth daily.      . pregabalin (LYRICA) 50 MG capsule Take 50 mg by mouth 2 (two) times daily.      . rosuvastatin (CRESTOR) 20 MG tablet Take 20 mg by mouth daily.      Marland Kitchen terazosin (HYTRIN) 10 MG capsule Take 10 mg by mouth 2 (two) times daily.      Marland Kitchen tiotropium (SPIRIVA) 18 MCG inhalation capsule Take 18 mcg by mouth daily.      . vitamin B-12 (CYANOCOBALAMIN) 1000 MCG tablet Take 1,000 mcg by mouth daily.      . Omega-3-acid Ethyl Esters (OMACOR PO) Take 1 g by mouth 4 (four) times daily.      . sildenafil (VIAGRA) 25 MG tablet Take 25 mg by mouth as needed for erectile dysfunction.       No current facility-administered medications on file prior to visit.   Allergies  Allergen Reactions  . Latex Rash  . Ointment, White Rash    Mycins ointment and pills  . Tape Rash    Adhesive tape    ROS: See HPI for pertinent positives and negatives.  Physical Examination  Filed Vitals:   10/01/14 0923  BP: 138/72  Pulse: 53  Resp: 14  Height: 5\' 9"  (1.753 m)  Weight: 238 lb (107.956 kg)  SpO2: 93%   Body mass index is 35.13 kg/(m^2).  General: A&O x 3, WD, Obese.  Pulmonary: Sym exp, good air movt, CTAB, no rales, rhonchi, or wheezing.  Cardiac: Sinus bradycardia, Nl S1, S2, no Murmurs, rubs or gallops.   Carotid Bruits  Left  Right    Negative  Negative    Aorta is not palpable  Radial pulses are 2+ palpable   VASCULAR EXAM:  LE Pulses  LEFT  RIGHT   FEMORAL  Not palpable, large panus, obese  not palpable, large panus, obese   POPLITEAL  not palpable  not palpable   POSTERIOR TIBIAL  2+palpable  2+ palpable   DORSALIS PEDIS  ANTERIOR TIBIAL  1+palpable  1+palpable    Gastrointestinal: soft, NTND, -G/R, - HSM, - masses palpated, - CVAT B.  Musculoskeletal: M/S 5/5 throughout, Extremities without ischemic changes, no peripheral edema.  Neurologic: CN 2-12 intact except  slight left facial droop only when he smiles, Pain and light touch intact in extremities, Motor exam as listed above.   Non-Invasive Vascular Imaging  AAA Duplex (10/01/2014)  ABDOMINAL AORTA DUPLEX EVALUATION    INDICATION: Evaluation of abdominal aorta.    PREVIOUS INTERVENTION(S):     DUPLEX EXAM:     LOCATION DIAMETER AP (cm) DIAMETER TRANSVERSE (cm) VELOCITIES (cm/sec)  Aorta Proximal 2.71  52  Aorta Mid 2.88 2.93 55  Aorta Distal 3.55  57  Right Common Iliac Artery 1.55    Left Common Iliac Artery 1.41      Previous max aortic diameter:  3.84 x 3.50 Date: 09/28/2013     ADDITIONAL FINDINGS: Technically difficult exam due to patient body habitus and overlying bowel gas.    IMPRESSION: Patent abdominal aortic aneurysm measuring approximately 3.55 cm in diameter.     Compared to the previous exam:  No significant change in comparison to the last exam on 09/28/2013.    Medical Decision Making  The patient is a 79 y.o. male who presents with asymptomatic AAA with no increase in size.   Based on this patient's exam and diagnostic studies, the patient will follow up in 1 year  with the following studies: AAA Duplex.  Consideration for repair of AAA would be made when the size is 5.5 cm, growth > 1 cm/yr, and symptomatic status.  I emphasized the importance of maximal medical management including strict control of blood pressure, blood glucose, and lipid levels, antiplatelet agents, obtaining regular exercise, and continued cessation of smoking.   The patient was given information about AAA including signs, symptoms, treatment, and how to minimize the risk of enlargement and rupture of aneurysms.    The patient was advised to call 911 should the patient experience sudden onset abdominal or back pain.   Thank you for allowing [place] to participate in this patient's care.  Clemon Chambers, RN, MSN, FNP-C [facility] [place] Office:  [facility] Physician: Trula Slade  10/01/2014, 9:16 AM

## 2014-10-03 DIAGNOSIS — H8113 Benign paroxysmal vertigo, bilateral: Secondary | ICD-10-CM | POA: Diagnosis not present

## 2014-10-03 DIAGNOSIS — R260 Ataxic gait: Secondary | ICD-10-CM | POA: Diagnosis not present

## 2014-10-03 DIAGNOSIS — R42 Dizziness and giddiness: Secondary | ICD-10-CM | POA: Diagnosis not present

## 2014-11-05 DIAGNOSIS — H04123 Dry eye syndrome of bilateral lacrimal glands: Secondary | ICD-10-CM | POA: Diagnosis not present

## 2014-11-05 DIAGNOSIS — H01006 Unspecified blepharitis left eye, unspecified eyelid: Secondary | ICD-10-CM | POA: Diagnosis not present

## 2014-11-05 DIAGNOSIS — H25813 Combined forms of age-related cataract, bilateral: Secondary | ICD-10-CM | POA: Diagnosis not present

## 2014-11-05 DIAGNOSIS — H01003 Unspecified blepharitis right eye, unspecified eyelid: Secondary | ICD-10-CM | POA: Diagnosis not present

## 2014-11-05 DIAGNOSIS — H35361 Drusen (degenerative) of macula, right eye: Secondary | ICD-10-CM | POA: Diagnosis not present

## 2014-11-05 DIAGNOSIS — H52203 Unspecified astigmatism, bilateral: Secondary | ICD-10-CM | POA: Diagnosis not present

## 2014-11-05 DIAGNOSIS — H5213 Myopia, bilateral: Secondary | ICD-10-CM | POA: Diagnosis not present

## 2014-11-22 DIAGNOSIS — H35363 Drusen (degenerative) of macula, bilateral: Secondary | ICD-10-CM | POA: Diagnosis not present

## 2014-12-10 DIAGNOSIS — I10 Essential (primary) hypertension: Secondary | ICD-10-CM | POA: Diagnosis not present

## 2014-12-10 DIAGNOSIS — R0781 Pleurodynia: Secondary | ICD-10-CM | POA: Diagnosis not present

## 2014-12-10 DIAGNOSIS — R0602 Shortness of breath: Secondary | ICD-10-CM | POA: Diagnosis not present

## 2014-12-10 DIAGNOSIS — E039 Hypothyroidism, unspecified: Secondary | ICD-10-CM | POA: Diagnosis not present

## 2014-12-10 DIAGNOSIS — R0689 Other abnormalities of breathing: Secondary | ICD-10-CM | POA: Diagnosis not present

## 2014-12-12 ENCOUNTER — Other Ambulatory Visit: Payer: Self-pay | Admitting: Endocrinology

## 2014-12-12 DIAGNOSIS — R42 Dizziness and giddiness: Secondary | ICD-10-CM

## 2014-12-19 ENCOUNTER — Ambulatory Visit
Admission: RE | Admit: 2014-12-19 | Discharge: 2014-12-19 | Disposition: A | Discharge status: Home / Self Care | Payer: Medicare Other | Source: Ambulatory Visit | Attending: Endocrinology | Admitting: Endocrinology

## 2014-12-19 DIAGNOSIS — R42 Dizziness and giddiness: Secondary | ICD-10-CM

## 2014-12-19 DIAGNOSIS — G43909 Migraine, unspecified, not intractable, without status migrainosus: Secondary | ICD-10-CM | POA: Diagnosis not present

## 2014-12-19 DIAGNOSIS — R55 Syncope and collapse: Secondary | ICD-10-CM | POA: Diagnosis not present

## 2014-12-19 DIAGNOSIS — I672 Cerebral atherosclerosis: Secondary | ICD-10-CM | POA: Diagnosis not present

## 2014-12-27 DIAGNOSIS — E789 Disorder of lipoprotein metabolism, unspecified: Secondary | ICD-10-CM | POA: Diagnosis not present

## 2014-12-27 DIAGNOSIS — Z125 Encounter for screening for malignant neoplasm of prostate: Secondary | ICD-10-CM | POA: Diagnosis not present

## 2014-12-27 DIAGNOSIS — I10 Essential (primary) hypertension: Secondary | ICD-10-CM | POA: Diagnosis not present

## 2014-12-27 DIAGNOSIS — Z79899 Other long term (current) drug therapy: Secondary | ICD-10-CM | POA: Diagnosis not present

## 2014-12-31 ENCOUNTER — Ambulatory Visit: Payer: TRICARE For Life (TFL) | Admitting: Neurology

## 2015-01-01 ENCOUNTER — Ambulatory Visit (INDEPENDENT_AMBULATORY_CARE_PROVIDER_SITE_OTHER): Payer: Medicare Other | Admitting: Neurology

## 2015-01-01 ENCOUNTER — Encounter: Payer: Self-pay | Admitting: Neurology

## 2015-01-01 VITALS — BP 137/72 | HR 56 | Ht 69.0 in | Wt 238.0 lb

## 2015-01-01 DIAGNOSIS — R292 Abnormal reflex: Secondary | ICD-10-CM

## 2015-01-01 DIAGNOSIS — M316 Other giant cell arteritis: Secondary | ICD-10-CM

## 2015-01-01 DIAGNOSIS — R42 Dizziness and giddiness: Secondary | ICD-10-CM

## 2015-01-01 DIAGNOSIS — I679 Cerebrovascular disease, unspecified: Secondary | ICD-10-CM

## 2015-01-01 DIAGNOSIS — I639 Cerebral infarction, unspecified: Secondary | ICD-10-CM | POA: Diagnosis not present

## 2015-01-01 NOTE — Progress Notes (Signed)
[facility]    Provider:  Dr Jaynee Eagles Referring Provider: Anda Kraft, MD Primary Care Physician:  Dwan Bolt, MD  CC:  Dizziness  HPI:  Derek [name] is a 80 y.o. male here as a referral from Dr. Wilson Singer for dizziness possibly vertigo, headache, loss of consciousness. PMHx CAD s/p 5-vessel CABG 2007, Chronic obstructive lung disease, vertigo, abdominal aortic aneurysm, HTN, hypothyroid, MI, OSA (no cpap), AV block, LBP with claudication, smoked for 55 years (quit 2001), obesity.   Symptoms started last winter. He got dizzy and fell down and hit his head on the wall. Last summer he had dizzyness then a syncopal episode.No syncope since stopping b-blocker, follows with cardiology.  Since then the episodes are happening more frequently. He gets dizzy, his head spins for 15 minutes. Always happens with a headache. Last Monday his head started hurting and he had to sit down and he felt his head spinning around for 5-10 minutes. Most of the time he can feel a headache in the temple areas before his head spins. Happens 5x a month on average. It lasts maybe 15-20 minutes where his head spins. No loss of consciousness. He went to vertigo therapy which helped.  He has cataracts and has some vision problems but doesn't know if vision changes during the headaches. He hasn't tried any medication for the vertigo. He takes some medications that cause dizziness but there were no new medications at onset of the symptoms. Symptoms are progressive, getting more frequent. He was evaluated by ENT before vestibular therapy. He is also having memory problems, he forgets things. He will forget everything that happens today. Bright light will trigger the headache. No history of migraines but his daughter has migraines. The headaches are in the temples, dull pressure pain. He has to go away from light when he has the headaches. He went to a store and looked up at the lights in a row and that triggered  the headaches and dizzyness.   Reviewed notes, labs and imaging from outside physicians, which showed: Patient follows with cardiology and reviewed office notes, syncopal episodes were thought to be related to Av block and LOC resolved after stopping b-blocker. He also has AAA and notes from vascular surgery describe asymptomatic AAA with no increase in size as of October 2015.  Echo 07/2014: Impressions:  Normal LV function; mild LVH, grade 1 diastolic dysfunction; biatrial enlargement.  Review of Systems: Patient complains of symptoms per HPI as well as the following symptoms:Blurred vision, joint pain, ringing in ears, aching muscles, impotence, skin sensitivity, confusion, headache, passing out, decreased energy. denies CP, SOB.Marland Kitchen Pertinent negatives per HPI. All others negative.   History   Social History  . Marital Status: Divorced    Spouse Name: N/A    Number of Children: N/A  . Years of Education: 12+   Occupational History  . Retired     Social History Main Topics  . Smoking status: Former Smoker    Types: Cigarettes    Quit date: 02/23/2000  . Smokeless tobacco: Never Used  . Alcohol Use: 0.0 - 0.6 oz/week    0-1 Cans of beer per week     Comment: Moderate to little  . Drug Use: No  . Sexual Activity: Not on file   Other Topics Concern  . Not on file   Social History Narrative   Patient lives at home alone    Patient is retired    Patient has 3 years of college  Family History  Problem Relation Age of Onset  . Diabetes Mother   . Heart disease Mother     Before age 73  . Diabetes Sister   . Hypertension Sister   . Diabetes Brother   . Hypertension Brother   . Hyperlipidemia Brother   . Heart attack Daughter   . Hypertension Son     Past Medical History  Diagnosis Date  . Coronary artery disease   . Hypertension   . Hypothyroidism   . Arthritis   . Myocardial infarction   . Sleep apnea     states mild . no cpap  . Shortness of breath       on exertion;  has productive cough at times  . COPD (chronic obstructive pulmonary disease)   . AAA (abdominal aortic aneurysm)     Past Surgical History  Procedure Laterality Date  . Coronary artery bypass graft  2007  . Knee arthroscopy  02/26/2012    Procedure: ARTHROSCOPY KNEE;  Surgeon: Sydnee Cabal, MD;  Location: [facility];  Service: Orthopedics;  Laterality: Left;  medial menisectomy and chondraplasty  . Appendectomy  1963    Current Outpatient Prescriptions  Medication Sig Dispense Refill  . acetaminophen (TYLENOL) 325 MG tablet Take 650 mg by mouth every 4 (four) hours as needed.    Marland Kitchen amLODipine-valsartan (EXFORGE) 5-160 MG per tablet Take 1 tablet by mouth daily.    Marland Kitchen aspirin 81 MG tablet Take 81 mg by mouth daily.    . beta carotene w/minerals (OCUVITE) tablet Take 1 tablet by mouth daily.    . Biotin (BIOTIN 5000) 5 MG CAPS Take by mouth daily.    . clobetasol (TEMOVATE) 0.05 % external solution Apply 1 application topically 2 (two) times daily.    . colchicine 0.6 MG tablet Take 0.6 mg by mouth daily.    Marland Kitchen desloratadine (CLARINEX) 5 MG tablet Take 5 mg by mouth as needed.    . ezetimibe (ZETIA) 10 MG tablet Take 10 mg by mouth daily.    . febuxostat (ULORIC) 40 MG tablet Take 80 mg by mouth daily.    . finasteride (PROSCAR) 5 MG tablet Take 5 mg by mouth daily.    . fish oil-omega-3 fatty acids 1000 MG capsule Take 2 g by mouth 2 (two) times daily.    . Glucosamine-Chondroit-Vit C-Mn (GLUCOSAMINE 1500 COMPLEX) CAPS Take 1 capsule by mouth.    Marland Kitchen guaiFENesin (MUCINEX) 600 MG 12 hr tablet Take 1,200 mg by mouth as needed.    Marland Kitchen levothyroxine (SYNTHROID, LEVOTHROID) 75 MCG tablet Take 88 mcg by mouth daily.     . Multiple Vitamin (MULTIVITAMIN) tablet Take 1 tablet by mouth daily.    . Omega-3-acid Ethyl Esters (OMACOR PO) Take 1 g by mouth 4 (four) times daily.    . pregabalin (LYRICA) 50 MG capsule Take 50 mg by mouth 2 (two) times daily.    .  rosuvastatin (CRESTOR) 20 MG tablet Take 20 mg by mouth daily.    Marland Kitchen tiotropium (SPIRIVA) 18 MCG inhalation capsule Take 18 mcg by mouth daily.    . vitamin B-12 (CYANOCOBALAMIN) 1000 MCG tablet Take 1,000 mcg by mouth daily.     No current facility-administered medications for this visit.    Allergies as of 01/01/2015 - Review Complete 01/01/2015  Allergen Reaction Noted  . Latex Rash 02/23/2012  . Ointment, white Rash 02/23/2012  . Tape Rash 02/23/2012    Vitals: BP 137/72 mmHg  Pulse 56  Ht  _0  (1.753 m)  Wt 238 lb (107.956 kg)  BMI 35.13 kg/m2 Last Weight:  Wt Readings from Last 1 Encounters:  01/01/15 238 lb (107.956 kg)   Last Height:   Ht Readings from Last 1 Encounters:  01/01/15 _1  (1.753 m)   Physical exam: Exam: Gen: NAD, conversant, well nourised, obese, well groomed                     CV: RRR, no MRG. No Carotid Bruits.  Eyes: Conjunctivae clear without exudates or hemorrhage  Neuro: Detailed Neurologic Exam  Speech:    Speech is normal; fluent and spontaneous with normal comprehension.  Cognition:    The patient is oriented to person, place, and time;     recent and remote memory intact;     language fluent;     normal attention, concentration,     fund of knowledge Cranial Nerves:    The pupils are equal, round, and reactive to light. Can't visualize fundi due to cataracts. Visual fields are full to finger confrontation. Extraocular movements are intact. Trigeminal sensation is intact and the muscles of mastication are normal. Mild assymmtery left ptosis (chronic). The palate elevates in the midline. Hearing intact. Voice is normal. Shoulder shrug is normal. The tongue has normal motion without fasciculations.   Coordination:    No dysmetria  Gait:    Normal native gait  Motor Observation:    No asymmetry, no atrophy, and no involuntary movements noted. Tone:    Normal muscle tone.    Posture:    Posture is normal. normal erect      Strength:    Strength is V/V in the upper and lower limbs.      Sensation: intact to LT     Reflex Exam:  DTR's: Absent patellar reflexes. Otherwise deep tendon reflexes in the upper and lower extremities are normal bilaterally.   Toes:    Left upgoing toe Clonus:    Clonus is absent.    Assessment/Plan:  80 year old male with a complicated PMHx with episodes of vertigo and headaches.   Headache: Will order esr/crp given headaches in the temple areas.    Vertigo: Possibly Vestibular migraines(do not treat with ca channel blockers or propranolol given previous history of av block), but this is a diagnosis of exclusion and so have to rule out all other possible causes such as central cause of vertigo or a vestibular schwannoma, cerebellar infarct and for pathologies affecting the brainstem or vestibular nerve, vascular event involving the cerebellum, (especially given patient's risk factors for stroke and vascular disease and left upgoing toe), MRA to detect stenosis or occlusion of the posterior circulation. Will order MRI of the brain and MRA of the head.   Sarina Ill, MD  [facility] [address] [place], [place] [address]  Phone [phone] Fax [phone]

## 2015-01-01 NOTE — Patient Instructions (Signed)
Overall you are doing fairly well but I do want to suggest a few things today:   Remember to drink plenty of fluid, eat healthy meals and do not skip any meals. Try to eat protein with a every meal and eat a healthy snack such as fruit or nuts in between meals. Try to keep a regular sleep-wake schedule and try to exercise daily, particularly in the form of walking, 20-30 minutes a day, if you can.   As far as diagnostic testing: MRI of the brain and vessels  I would like to see you back in 3 months, sooner if we need to. Please call us with any interim questions, concerns, problems, updates or refill requests.   Please also call us for any test results so we can go over those with you on the phone.  My clinical assistant and will answer any of your questions and relay your messages to me and also relay most of my messages to you.   Our phone number is [phone]. We also have an after hours call service for urgent matters and there is a physician on-call for urgent questions. For any emergencies you know to call 911 or go to the nearest emergency room

## 2015-01-02 LAB — C-REACTIVE PROTEIN: CRP: 0.5 mg/L (ref 0.0–4.9)

## 2015-01-02 LAB — SEDIMENTATION RATE: Sed Rate: 3 mm/hr (ref 0–30)

## 2015-01-03 DIAGNOSIS — E789 Disorder of lipoprotein metabolism, unspecified: Secondary | ICD-10-CM | POA: Diagnosis not present

## 2015-01-03 DIAGNOSIS — L918 Other hypertrophic disorders of the skin: Secondary | ICD-10-CM | POA: Diagnosis not present

## 2015-01-03 DIAGNOSIS — E032 Hypothyroidism due to medicaments and other exogenous substances: Secondary | ICD-10-CM | POA: Diagnosis not present

## 2015-01-03 DIAGNOSIS — L570 Actinic keratosis: Secondary | ICD-10-CM | POA: Diagnosis not present

## 2015-01-09 DIAGNOSIS — H35363 Drusen (degenerative) of macula, bilateral: Secondary | ICD-10-CM | POA: Diagnosis not present

## 2015-01-09 DIAGNOSIS — H25813 Combined forms of age-related cataract, bilateral: Secondary | ICD-10-CM | POA: Diagnosis not present

## 2015-01-09 DIAGNOSIS — H02403 Unspecified ptosis of bilateral eyelids: Secondary | ICD-10-CM | POA: Diagnosis not present

## 2015-01-17 ENCOUNTER — Ambulatory Visit
Admission: RE | Admit: 2015-01-17 | Discharge: 2015-01-17 | Disposition: A | Discharge status: Home / Self Care | Payer: Medicare Other | Source: Ambulatory Visit | Attending: Neurology | Admitting: Neurology

## 2015-01-17 DIAGNOSIS — I639 Cerebral infarction, unspecified: Secondary | ICD-10-CM

## 2015-01-17 DIAGNOSIS — R292 Abnormal reflex: Secondary | ICD-10-CM

## 2015-01-17 DIAGNOSIS — R42 Dizziness and giddiness: Secondary | ICD-10-CM

## 2015-01-17 DIAGNOSIS — I679 Cerebrovascular disease, unspecified: Secondary | ICD-10-CM

## 2015-01-24 DIAGNOSIS — Z83518 Family history of other specified eye disorder: Secondary | ICD-10-CM | POA: Diagnosis not present

## 2015-01-24 DIAGNOSIS — Z01818 Encounter for other preprocedural examination: Secondary | ICD-10-CM | POA: Diagnosis not present

## 2015-01-24 DIAGNOSIS — Z8679 Personal history of other diseases of the circulatory system: Secondary | ICD-10-CM | POA: Diagnosis not present

## 2015-01-24 DIAGNOSIS — Z9089 Acquired absence of other organs: Secondary | ICD-10-CM | POA: Diagnosis not present

## 2015-01-24 DIAGNOSIS — J449 Chronic obstructive pulmonary disease, unspecified: Secondary | ICD-10-CM | POA: Diagnosis not present

## 2015-01-24 DIAGNOSIS — H25813 Combined forms of age-related cataract, bilateral: Secondary | ICD-10-CM | POA: Diagnosis not present

## 2015-01-24 DIAGNOSIS — Z87891 Personal history of nicotine dependence: Secondary | ICD-10-CM | POA: Diagnosis not present

## 2015-01-28 ENCOUNTER — Telehealth: Payer: Self-pay | Admitting: Neurology

## 2015-01-28 NOTE — Telephone Encounter (Signed)
Spoke to patient and relayed results from [facility] of the brain and MRA of the head. He is still having dizzy spells with headache but very infrequent and not interested in medications at this time, he will follow up as necessary.

## 2015-01-31 DIAGNOSIS — Z8673 Personal history of transient ischemic attack (TIA), and cerebral infarction without residual deficits: Secondary | ICD-10-CM | POA: Diagnosis not present

## 2015-01-31 DIAGNOSIS — M109 Gout, unspecified: Secondary | ICD-10-CM | POA: Diagnosis not present

## 2015-01-31 DIAGNOSIS — R001 Bradycardia, unspecified: Secondary | ICD-10-CM | POA: Diagnosis not present

## 2015-01-31 DIAGNOSIS — R42 Dizziness and giddiness: Secondary | ICD-10-CM | POA: Diagnosis not present

## 2015-01-31 DIAGNOSIS — I2510 Atherosclerotic heart disease of native coronary artery without angina pectoris: Secondary | ICD-10-CM | POA: Diagnosis not present

## 2015-01-31 DIAGNOSIS — G629 Polyneuropathy, unspecified: Secondary | ICD-10-CM | POA: Diagnosis not present

## 2015-01-31 DIAGNOSIS — J449 Chronic obstructive pulmonary disease, unspecified: Secondary | ICD-10-CM | POA: Diagnosis not present

## 2015-01-31 DIAGNOSIS — E780 Pure hypercholesterolemia: Secondary | ICD-10-CM | POA: Diagnosis not present

## 2015-01-31 DIAGNOSIS — H25812 Combined forms of age-related cataract, left eye: Secondary | ICD-10-CM | POA: Diagnosis not present

## 2015-01-31 DIAGNOSIS — Z7982 Long term (current) use of aspirin: Secondary | ICD-10-CM | POA: Diagnosis not present

## 2015-01-31 DIAGNOSIS — Z87891 Personal history of nicotine dependence: Secondary | ICD-10-CM | POA: Diagnosis not present

## 2015-01-31 DIAGNOSIS — N400 Enlarged prostate without lower urinary tract symptoms: Secondary | ICD-10-CM | POA: Diagnosis not present

## 2015-01-31 DIAGNOSIS — E079 Disorder of thyroid, unspecified: Secondary | ICD-10-CM | POA: Diagnosis not present

## 2015-01-31 DIAGNOSIS — R55 Syncope and collapse: Secondary | ICD-10-CM | POA: Diagnosis not present

## 2015-01-31 DIAGNOSIS — I10 Essential (primary) hypertension: Secondary | ICD-10-CM | POA: Diagnosis not present

## 2015-02-19 HISTORY — PX: EYE SURGERY: SHX253

## 2015-02-20 DIAGNOSIS — H02834 Dermatochalasis of left upper eyelid: Secondary | ICD-10-CM | POA: Diagnosis not present

## 2015-02-20 DIAGNOSIS — Z83518 Family history of other specified eye disorder: Secondary | ICD-10-CM | POA: Diagnosis not present

## 2015-02-20 DIAGNOSIS — Z8673 Personal history of transient ischemic attack (TIA), and cerebral infarction without residual deficits: Secondary | ICD-10-CM | POA: Diagnosis not present

## 2015-02-20 DIAGNOSIS — H2511 Age-related nuclear cataract, right eye: Secondary | ICD-10-CM | POA: Diagnosis not present

## 2015-02-20 DIAGNOSIS — L908 Other atrophic disorders of skin: Secondary | ICD-10-CM | POA: Diagnosis not present

## 2015-02-20 DIAGNOSIS — Z7982 Long term (current) use of aspirin: Secondary | ICD-10-CM | POA: Diagnosis not present

## 2015-02-20 DIAGNOSIS — H02831 Dermatochalasis of right upper eyelid: Secondary | ICD-10-CM | POA: Diagnosis not present

## 2015-02-20 DIAGNOSIS — E039 Hypothyroidism, unspecified: Secondary | ICD-10-CM | POA: Diagnosis not present

## 2015-02-20 DIAGNOSIS — H11003 Unspecified pterygium of eye, bilateral: Secondary | ICD-10-CM | POA: Diagnosis not present

## 2015-02-20 DIAGNOSIS — Z87891 Personal history of nicotine dependence: Secondary | ICD-10-CM | POA: Diagnosis not present

## 2015-02-20 DIAGNOSIS — I25810 Atherosclerosis of coronary artery bypass graft(s) without angina pectoris: Secondary | ICD-10-CM | POA: Diagnosis not present

## 2015-02-20 DIAGNOSIS — Z961 Presence of intraocular lens: Secondary | ICD-10-CM | POA: Diagnosis not present

## 2015-02-20 DIAGNOSIS — J449 Chronic obstructive pulmonary disease, unspecified: Secondary | ICD-10-CM | POA: Diagnosis not present

## 2015-02-20 DIAGNOSIS — Z8249 Family history of ischemic heart disease and other diseases of the circulatory system: Secondary | ICD-10-CM | POA: Diagnosis not present

## 2015-02-20 DIAGNOSIS — H02423 Myogenic ptosis of bilateral eyelids: Secondary | ICD-10-CM | POA: Diagnosis not present

## 2015-03-20 DIAGNOSIS — L908 Other atrophic disorders of skin: Secondary | ICD-10-CM | POA: Diagnosis not present

## 2015-03-20 DIAGNOSIS — E039 Hypothyroidism, unspecified: Secondary | ICD-10-CM | POA: Diagnosis not present

## 2015-03-20 DIAGNOSIS — H02403 Unspecified ptosis of bilateral eyelids: Secondary | ICD-10-CM | POA: Diagnosis not present

## 2015-03-20 DIAGNOSIS — H02423 Myogenic ptosis of bilateral eyelids: Secondary | ICD-10-CM | POA: Diagnosis not present

## 2015-03-20 DIAGNOSIS — M109 Gout, unspecified: Secondary | ICD-10-CM | POA: Diagnosis not present

## 2015-03-20 DIAGNOSIS — Z87891 Personal history of nicotine dependence: Secondary | ICD-10-CM | POA: Diagnosis not present

## 2015-03-20 DIAGNOSIS — I10 Essential (primary) hypertension: Secondary | ICD-10-CM | POA: Diagnosis not present

## 2015-03-20 DIAGNOSIS — H0234 Blepharochalasis left upper eyelid: Secondary | ICD-10-CM | POA: Diagnosis not present

## 2015-03-20 DIAGNOSIS — E780 Pure hypercholesterolemia: Secondary | ICD-10-CM | POA: Diagnosis not present

## 2015-03-20 DIAGNOSIS — Z7982 Long term (current) use of aspirin: Secondary | ICD-10-CM | POA: Diagnosis not present

## 2015-03-20 DIAGNOSIS — Z79899 Other long term (current) drug therapy: Secondary | ICD-10-CM | POA: Diagnosis not present

## 2015-03-20 DIAGNOSIS — J449 Chronic obstructive pulmonary disease, unspecified: Secondary | ICD-10-CM | POA: Diagnosis not present

## 2015-03-20 DIAGNOSIS — Z951 Presence of aortocoronary bypass graft: Secondary | ICD-10-CM | POA: Diagnosis not present

## 2015-03-20 DIAGNOSIS — E785 Hyperlipidemia, unspecified: Secondary | ICD-10-CM | POA: Diagnosis not present

## 2015-03-20 DIAGNOSIS — Z8673 Personal history of transient ischemic attack (TIA), and cerebral infarction without residual deficits: Secondary | ICD-10-CM | POA: Diagnosis not present

## 2015-03-20 DIAGNOSIS — Z961 Presence of intraocular lens: Secondary | ICD-10-CM | POA: Diagnosis not present

## 2015-03-20 DIAGNOSIS — H0231 Blepharochalasis right upper eyelid: Secondary | ICD-10-CM | POA: Diagnosis not present

## 2015-03-28 DIAGNOSIS — M1A09X0 Idiopathic chronic gout, multiple sites, without tophus (tophi): Secondary | ICD-10-CM | POA: Diagnosis not present

## 2015-03-28 DIAGNOSIS — M150 Primary generalized (osteo)arthritis: Secondary | ICD-10-CM | POA: Diagnosis not present

## 2015-04-02 ENCOUNTER — Ambulatory Visit (INDEPENDENT_AMBULATORY_CARE_PROVIDER_SITE_OTHER): Payer: Medicare Other | Admitting: Neurology

## 2015-04-02 ENCOUNTER — Encounter: Payer: Self-pay | Admitting: Neurology

## 2015-04-02 VITALS — BP 140/77 | HR 52 | Ht 69.0 in | Wt 236.0 lb

## 2015-04-02 DIAGNOSIS — I639 Cerebral infarction, unspecified: Secondary | ICD-10-CM | POA: Diagnosis not present

## 2015-04-02 DIAGNOSIS — R42 Dizziness and giddiness: Secondary | ICD-10-CM

## 2015-04-02 NOTE — Progress Notes (Signed)
[facility]    Provider:  Dr Jaynee Eagles Referring Provider: Anda Kraft, MD Primary Care Physician:  Dwan Bolt, MD  CC:  dizziness  HPI:  Derek [name] is a 80 y.o. male here as a follow up  Interval update 04/02/2015:The vertigo is stable. Only 2-3 times since last being seen that it was so severe he had to sit down. Still gets dizzy from time to time but brief. He had cataracts removed and eye lift, maybe that will help with light sensitivity. Discussed the differential from last appointment, that vertigo and dizziness is a symptoms and can be caused by many different things. Reviewed MRI and MRA findings.   Initial visit 01/01/2014: Derek [name] is a 80 y.o. male here as a referral from Dr. Wilson Singer for dizziness possibly vertigo, headache, loss of consciousness. PMHx CAD s/p 5-vessel CABG 2007, Chronic obstructive lung disease, vertigo, abdominal aortic aneurysm, HTN, hypothyroid, MI, OSA (no cpap), AV block, LBP with claudication, smoked for 55 years (quit 2001), obesity.   Symptoms started last winter. He got dizzy and fell down and hit his head on the wall. Last summer he had dizzyness then a syncopal episode.No syncope since stopping b-blocker, follows with cardiology. Since then the episodes are happening more frequently. He gets dizzy, his head spins for 15 minutes. Always happens with a headache. Last Monday his head started hurting and he had to sit down and he felt his head spinning around for 5-10 minutes. Most of the time he can feel a headache in the temple areas before his head spins. Happens 5x a month on average. It lasts maybe 15-20 minutes where his head spins. No loss of consciousness. He went to vertigo therapy which helped. He has cataracts and has some vision problems but doesn't know if vision changes during the headaches. He hasn't tried any medication for the vertigo. He takes some medications that cause dizziness but there were no new  medications at onset of the symptoms. Symptoms are progressive, getting more frequent. He was evaluated by ENT before vestibular therapy. He is also having memory problems, he forgets things. He will forget everything that happens today. Bright light will trigger the headache. No history of migraines but his daughter has migraines. The headaches are in the temples, dull pressure pain. He has to go away from light when he has the headaches. He went to a store and looked up at the lights in a row and that triggered the headaches and dizzyness.   Reviewed notes, labs and imaging from outside physicians, which showed: Patient follows with cardiology and reviewed office notes, syncopal episodes were thought to be related to Av block and LOC resolved after stopping b-blocker. He also has AAA and notes from vascular surgery describe asymptomatic AAA with no increase in size as of October 2015.  Echo 07/2014: Impressions: Normal LV function; mild LVH, grade 1 diastolic dysfunction; biatrial enlargement.  Review of Systems: Patient complains of symptoms per HPI as well as the following symptoms: ringing in the ears. Pertinent negatives per HPI. All others negative.   History   Social History  . Marital Status: Divorced    Spouse Name: N/A  . Number of Children: N/A  . Years of Education: 12+   Occupational History  . Retired     Social History Main Topics  . Smoking status: Former Smoker    Types: Cigarettes    Quit date: 02/23/2000  . Smokeless tobacco: Never Used  . Alcohol Use: 0.0 -  0.6 oz/week    0-1 Cans of beer per week     Comment: Moderate to little  . Drug Use: No  . Sexual Activity: Not on file   Other Topics Concern  . Not on file   Social History Narrative   Patient lives at home alone    Patient is retired    Patient has 3 years of college        Family History  Problem Relation Age of Onset  . Diabetes Mother   . Heart disease Mother     Before age 32  .  Diabetes Sister   . Hypertension Sister   . Diabetes Brother   . Hypertension Brother   . Hyperlipidemia Brother   . Heart attack Daughter   . Hypertension Son     Past Medical History  Diagnosis Date  . Coronary artery disease   . Hypertension   . Hypothyroidism   . Arthritis   . Myocardial infarction   . Sleep apnea     states mild . no cpap  . Shortness of breath     on exertion;  has productive cough at times  . COPD (chronic obstructive pulmonary disease)   . AAA (abdominal aortic aneurysm)     Past Surgical History  Procedure Laterality Date  . Coronary artery bypass graft  2007  . Knee arthroscopy  02/26/2012    Procedure: ARTHROSCOPY KNEE;  Surgeon: Sydnee Cabal, MD;  Location: [facility];  Service: Orthopedics;  Laterality: Left;  medial menisectomy and chondraplasty  . Appendectomy  1963  . Cataract extraction      w/ lid rise    Current Outpatient Prescriptions  Medication Sig Dispense Refill  . acetaminophen (TYLENOL) 325 MG tablet Take 650 mg by mouth every 4 (four) hours as needed.    Marland Kitchen amLODipine-valsartan (EXFORGE) 5-160 MG per tablet Take 1 tablet by mouth daily.    Marland Kitchen aspirin 81 MG tablet Take 81 mg by mouth daily.    . beta carotene w/minerals (OCUVITE) tablet Take 1 tablet by mouth daily.    . Biotin (BIOTIN 5000) 5 MG CAPS Take by mouth daily.    . clobetasol (TEMOVATE) 0.05 % external solution Apply 1 application topically 2 (two) times daily.    . colchicine 0.6 MG tablet Take 0.6 mg by mouth daily.    Marland Kitchen desloratadine (CLARINEX) 5 MG tablet Take 5 mg by mouth as needed.    . ezetimibe (ZETIA) 10 MG tablet Take 10 mg by mouth daily.    . febuxostat (ULORIC) 40 MG tablet Take 80 mg by mouth daily.    . finasteride (PROSCAR) 5 MG tablet Take 5 mg by mouth daily.    . fish oil-omega-3 fatty acids 1000 MG capsule Take 2 g by mouth 2 (two) times daily.    . Glucosamine-Chondroit-Vit C-Mn (GLUCOSAMINE 1500 COMPLEX) CAPS Take 1 capsule by  mouth.    Marland Kitchen guaiFENesin (MUCINEX) 600 MG 12 hr tablet Take 1,200 mg by mouth as needed.    Marland Kitchen levothyroxine (SYNTHROID, LEVOTHROID) 75 MCG tablet Take 88 mcg by mouth daily.     . Multiple Vitamin (MULTIVITAMIN) tablet Take 1 tablet by mouth daily.    . Omega-3-acid Ethyl Esters (OMACOR PO) Take 1 g by mouth 4 (four) times daily.    . pregabalin (LYRICA) 50 MG capsule Take 50 mg by mouth 2 (two) times daily.    . rosuvastatin (CRESTOR) 20 MG tablet Take 20 mg by mouth daily.    Marland Kitchen  Sildenafil Citrate (VIAGRA PO) Take by mouth as needed.    . terazosin (HYTRIN) 10 MG capsule Take 10 mg by mouth at bedtime.    Marland Kitchen tiotropium (SPIRIVA) 18 MCG inhalation capsule Take 18 mcg by mouth daily.    Marland Kitchen UNABLE TO FIND Take 135 mg by mouth daily. TRIPLIX    . vitamin B-12 (CYANOCOBALAMIN) 1000 MCG tablet Take 1,000 mcg by mouth daily.     No current facility-administered medications for this visit.    Allergies as of 04/02/2015 - Review Complete 04/02/2015  Allergen Reaction Noted  . Latex Rash 02/23/2012  . Ointment, white Rash 02/23/2012  . Tape Rash 02/23/2012    Vitals: BP 140/77 mmHg  Pulse 52  Ht 5' 9"  (1.753 m)  Wt 236 lb (107.049 kg)  BMI 34.84 kg/m2 Last Weight:  Wt Readings from Last 1 Encounters:  04/02/15 236 lb (107.049 kg)   Last Height:   Ht Readings from Last 1 Encounters:  04/02/15 5' 9"  (1.753 m)    Speech:  Speech is normal; fluent and spontaneous with normal comprehension.  Cognition:  The patient is oriented to person, place, and time;   recent and remote memory intact;   language fluent;   normal attention, concentration,   fund of knowledge Cranial Nerves:  The pupils are equal, round, and reactive to light. Visual fields are full to finger confrontation. Extraocular movements are intact. Trigeminal sensation is intact and the muscles of mastication are normal. Face symmetric (recent lid lift). The palate elevates in the midline. Hearing intact.  Voice is normal. Shoulder shrug is normal. The tongue has normal motion without fasciculations.       Assessment/Plan:  Assessment/Plan: 80 year old male with a complicated PMHx with episodes of vertigo and headaches.   Headache: Will order esr/crp given headaches in the temple areas.  (were normal)   Vertigo: Possibly Vestibular migraines(do not treat with ca channel blockers or propranolol given previous history of av block), but this is a diagnosis of exclusion and so have to rule out all other possible causes such as central cause of vertigo or a vestibular schwannoma, cerebellar infarct and for pathologies affecting the brainstem or vestibular nerve, vascular event involving the cerebellum, (especially given patient's risk factors for stroke and vascular disease and left upgoing toe), MRA to detect stenosis or occlusion of the posterior circulation. Will order MRI of the brain and MRA of the head.  MRI of the head and MRA of the head were unremarkable F/u as needed  Sarina Ill, MD  [facility] [address] [place], [place] [address]  Phone [phone] Fax [phone]  A total of 15 minutes was spent face-to-face with this patient. Over half this time was spent on counseling patient on the vertigo diagnosis and different diagnostic and therapeutic options available.

## 2015-04-09 DIAGNOSIS — I719 Aortic aneurysm of unspecified site, without rupture: Secondary | ICD-10-CM | POA: Diagnosis not present

## 2015-04-09 DIAGNOSIS — Z48810 Encounter for surgical aftercare following surgery on the sense organs: Secondary | ICD-10-CM | POA: Diagnosis not present

## 2015-04-09 DIAGNOSIS — H02403 Unspecified ptosis of bilateral eyelids: Secondary | ICD-10-CM | POA: Diagnosis not present

## 2015-04-09 DIAGNOSIS — H02423 Myogenic ptosis of bilateral eyelids: Secondary | ICD-10-CM | POA: Diagnosis not present

## 2015-04-09 DIAGNOSIS — H02836 Dermatochalasis of left eye, unspecified eyelid: Secondary | ICD-10-CM | POA: Diagnosis not present

## 2015-04-09 DIAGNOSIS — Z87891 Personal history of nicotine dependence: Secondary | ICD-10-CM | POA: Diagnosis not present

## 2015-04-09 DIAGNOSIS — Z8249 Family history of ischemic heart disease and other diseases of the circulatory system: Secondary | ICD-10-CM | POA: Diagnosis not present

## 2015-04-09 DIAGNOSIS — L908 Other atrophic disorders of skin: Secondary | ICD-10-CM | POA: Diagnosis not present

## 2015-04-09 DIAGNOSIS — I358 Other nonrheumatic aortic valve disorders: Secondary | ICD-10-CM | POA: Diagnosis not present

## 2015-04-09 DIAGNOSIS — H02833 Dermatochalasis of right eye, unspecified eyelid: Secondary | ICD-10-CM | POA: Diagnosis not present

## 2015-04-09 DIAGNOSIS — J449 Chronic obstructive pulmonary disease, unspecified: Secondary | ICD-10-CM | POA: Diagnosis not present

## 2015-04-09 DIAGNOSIS — Z8673 Personal history of transient ischemic attack (TIA), and cerebral infarction without residual deficits: Secondary | ICD-10-CM | POA: Diagnosis not present

## 2015-04-09 DIAGNOSIS — Z83518 Family history of other specified eye disorder: Secondary | ICD-10-CM | POA: Diagnosis not present

## 2015-04-21 HISTORY — PX: EYE SURGERY: SHX253

## 2015-05-03 DIAGNOSIS — N401 Enlarged prostate with lower urinary tract symptoms: Secondary | ICD-10-CM | POA: Diagnosis not present

## 2015-05-03 DIAGNOSIS — R351 Nocturia: Secondary | ICD-10-CM | POA: Diagnosis not present

## 2015-05-03 DIAGNOSIS — R3913 Splitting of urinary stream: Secondary | ICD-10-CM | POA: Diagnosis not present

## 2015-05-03 DIAGNOSIS — R3912 Poor urinary stream: Secondary | ICD-10-CM | POA: Diagnosis not present

## 2015-05-22 HISTORY — PX: EYE SURGERY: SHX253

## 2015-06-10 DIAGNOSIS — Z9889 Other specified postprocedural states: Secondary | ICD-10-CM | POA: Diagnosis not present

## 2015-06-10 DIAGNOSIS — Z9842 Cataract extraction status, left eye: Secondary | ICD-10-CM | POA: Diagnosis not present

## 2015-06-10 DIAGNOSIS — H25811 Combined forms of age-related cataract, right eye: Secondary | ICD-10-CM | POA: Diagnosis not present

## 2015-06-10 DIAGNOSIS — Z961 Presence of intraocular lens: Secondary | ICD-10-CM | POA: Diagnosis not present

## 2015-06-18 DIAGNOSIS — Z8673 Personal history of transient ischemic attack (TIA), and cerebral infarction without residual deficits: Secondary | ICD-10-CM | POA: Diagnosis not present

## 2015-06-18 DIAGNOSIS — I10 Essential (primary) hypertension: Secondary | ICD-10-CM | POA: Diagnosis not present

## 2015-06-18 DIAGNOSIS — I25810 Atherosclerosis of coronary artery bypass graft(s) without angina pectoris: Secondary | ICD-10-CM | POA: Diagnosis not present

## 2015-06-18 DIAGNOSIS — E079 Disorder of thyroid, unspecified: Secondary | ICD-10-CM | POA: Diagnosis not present

## 2015-06-18 DIAGNOSIS — H25811 Combined forms of age-related cataract, right eye: Secondary | ICD-10-CM | POA: Diagnosis not present

## 2015-06-18 DIAGNOSIS — Z7982 Long term (current) use of aspirin: Secondary | ICD-10-CM | POA: Diagnosis not present

## 2015-06-18 DIAGNOSIS — J449 Chronic obstructive pulmonary disease, unspecified: Secondary | ICD-10-CM | POA: Diagnosis not present

## 2015-06-18 DIAGNOSIS — E780 Pure hypercholesterolemia: Secondary | ICD-10-CM | POA: Diagnosis not present

## 2015-06-18 DIAGNOSIS — N400 Enlarged prostate without lower urinary tract symptoms: Secondary | ICD-10-CM | POA: Diagnosis not present

## 2015-06-27 DIAGNOSIS — E789 Disorder of lipoprotein metabolism, unspecified: Secondary | ICD-10-CM | POA: Diagnosis not present

## 2015-07-04 DIAGNOSIS — R197 Diarrhea, unspecified: Secondary | ICD-10-CM | POA: Diagnosis not present

## 2015-07-04 DIAGNOSIS — L570 Actinic keratosis: Secondary | ICD-10-CM | POA: Diagnosis not present

## 2015-07-04 DIAGNOSIS — E032 Hypothyroidism due to medicaments and other exogenous substances: Secondary | ICD-10-CM | POA: Diagnosis not present

## 2015-07-04 DIAGNOSIS — I10 Essential (primary) hypertension: Secondary | ICD-10-CM | POA: Diagnosis not present

## 2015-07-10 DIAGNOSIS — I10 Essential (primary) hypertension: Secondary | ICD-10-CM | POA: Diagnosis not present

## 2015-07-10 DIAGNOSIS — Z86010 Personal history of colonic polyps: Secondary | ICD-10-CM | POA: Diagnosis not present

## 2015-07-10 DIAGNOSIS — R197 Diarrhea, unspecified: Secondary | ICD-10-CM | POA: Diagnosis not present

## 2015-09-17 DIAGNOSIS — Z86010 Personal history of colonic polyps: Secondary | ICD-10-CM | POA: Diagnosis not present

## 2015-09-17 DIAGNOSIS — K5730 Diverticulosis of large intestine without perforation or abscess without bleeding: Secondary | ICD-10-CM | POA: Diagnosis not present

## 2015-09-17 DIAGNOSIS — Z1211 Encounter for screening for malignant neoplasm of colon: Secondary | ICD-10-CM | POA: Diagnosis not present

## 2015-09-17 DIAGNOSIS — K635 Polyp of colon: Secondary | ICD-10-CM | POA: Diagnosis not present

## 2015-09-17 DIAGNOSIS — D122 Benign neoplasm of ascending colon: Secondary | ICD-10-CM | POA: Diagnosis not present

## 2015-10-10 ENCOUNTER — Ambulatory Visit: Payer: Medicare Other | Admitting: Family

## 2015-10-10 ENCOUNTER — Other Ambulatory Visit (HOSPITAL_COMMUNITY): Payer: Medicare Other

## 2015-10-10 DIAGNOSIS — E789 Disorder of lipoprotein metabolism, unspecified: Secondary | ICD-10-CM | POA: Diagnosis not present

## 2015-10-16 ENCOUNTER — Encounter: Payer: Self-pay | Admitting: Family

## 2015-10-18 ENCOUNTER — Ambulatory Visit: Payer: Medicare Other | Admitting: Family

## 2015-10-18 ENCOUNTER — Other Ambulatory Visit (HOSPITAL_COMMUNITY): Payer: Medicare Other

## 2015-10-30 ENCOUNTER — Ambulatory Visit: Payer: Medicare Other | Admitting: Family

## 2015-10-30 ENCOUNTER — Other Ambulatory Visit (HOSPITAL_COMMUNITY): Payer: Medicare Other

## 2015-11-04 ENCOUNTER — Encounter: Payer: Self-pay | Admitting: Family

## 2015-11-06 ENCOUNTER — Encounter: Payer: Self-pay | Admitting: Family

## 2015-11-06 ENCOUNTER — Ambulatory Visit (INDEPENDENT_AMBULATORY_CARE_PROVIDER_SITE_OTHER): Payer: Medicare Other | Admitting: Family

## 2015-11-06 ENCOUNTER — Ambulatory Visit (HOSPITAL_COMMUNITY)
Admission: RE | Admit: 2015-11-06 | Discharge: 2015-11-06 | Disposition: A | Discharge status: Home / Self Care | Payer: Medicare Other | Source: Ambulatory Visit | Attending: Family | Admitting: Family

## 2015-11-06 VITALS — BP 131/72 | HR 54 | Temp 97.0°F | Resp 14 | Ht 69.0 in | Wt 226.0 lb

## 2015-11-06 DIAGNOSIS — I714 Abdominal aortic aneurysm, without rupture, unspecified: Secondary | ICD-10-CM

## 2015-11-06 DIAGNOSIS — Z87891 Personal history of nicotine dependence: Secondary | ICD-10-CM | POA: Diagnosis not present

## 2015-11-06 DIAGNOSIS — I639 Cerebral infarction, unspecified: Secondary | ICD-10-CM | POA: Diagnosis not present

## 2015-11-06 NOTE — Progress Notes (Signed)
[facility] [place]  Established Abdominal Aortic Aneurysm  History of Present Illness  Derek [name] is a 80 y.o. (03-05-1935) male patient that Dr. Oneida Alar has been following and presents today for evaluation of abdominal aortic aneurysm.  Previous studies demonstrate an AAA, measuring 3.84 cm on 09/28/13. The patient does have chronic back pain, his AAA was found as part of evaluation of his sciatica. He states occasional mild LLQ abdominal pain but no acute abdominal pain. The patient is a former smoker, quit in 2001.  He admits to being sedentary; encouraged him to do some moderate exercise at least 30 min./day, at least 5 days/week.  The patient denies claudication in legs with walking, his sciatica is in his left leg which is relieved by Lyrica.  The patient reports in 1997 that he might have had a TIA manifested as unilateral facial drooping, drooling, feeling tired; he was evaluated at the time and does not know the results; no further TIA episodes.  He reports dyspnea with exertion, attributes to hx of smoking for 55 years, quit in 2001; this is his primary barrier to walking or physical activity. He had physical therapy for pain in both hips, does not know if this is from OA or lumbar spine etiology; his walking is also limited by bilateral hips pain that seems to radiate down both legs. He states Lyrica helps this pain a great deal.   He had 5 vessel CABG in 2007, states he is doing well enough that he no longer needs to see his cardiologist.   He reports new medical problems or surgeries since last seen in this office: bilateral cataracts extraction with IOL's and bilaterall blephorplasty.   He takes a daily 81 mg ASA, daily statin, and no other antiplatelet agents nor anticoagulants.  Pt Diabetic: No   Past Medical History  Diagnosis Date  . Coronary artery disease   . Hypertension   . Hypothyroidism   . Arthritis   . Myocardial infarction ([place])    . Sleep apnea     states mild . no cpap  . Shortness of breath     on exertion;  has productive cough at times  . COPD (chronic obstructive pulmonary disease)   . AAA (abdominal aortic aneurysm)    Past Surgical History  Procedure Laterality Date  . Coronary artery bypass graft  2007  . Knee arthroscopy  02/26/2012    Procedure: ARTHROSCOPY KNEE;  Surgeon: Sydnee Cabal, MD;  Location: [facility];  Service: Orthopedics;  Laterality: Left;  medial menisectomy and chondraplasty  . Appendectomy  1963  . Cataract extraction      w/ lid rise   Social History Social History   Social History  . Marital Status: Divorced    Spouse Name: N/A  . Number of Children: N/A  . Years of Education: 12+   Occupational History  . Retired     Social History Main Topics  . Smoking status: Former Smoker    Types: Cigarettes    Quit date: 02/23/2000  . Smokeless tobacco: Never Used  . Alcohol Use: 0.0 - 0.6 oz/week    0-1 Cans of beer per week     Comment: Moderate to little  . Drug Use: No  . Sexual Activity: Not on file   Other Topics Concern  . Not on file   Social History Narrative   Patient lives at home alone    Patient is retired    Patient has  3 years of college       Family History Family History  Problem Relation Age of Onset  . Diabetes Mother   . Heart disease Mother     Before age 73  . Diabetes Sister   . Hypertension Sister   . Diabetes Brother   . Hypertension Brother   . Hyperlipidemia Brother   . Heart attack Daughter   . Hypertension Son     Current Outpatient Prescriptions on File Prior to Visit  Medication Sig Dispense Refill  . acetaminophen (TYLENOL) 325 MG tablet Take 650 mg by mouth every 4 (four) hours as needed.    Marland Kitchen amLODipine-valsartan (EXFORGE) 5-160 MG per tablet Take 1 tablet by mouth daily.    Marland Kitchen aspirin 81 MG tablet Take 81 mg by mouth daily.    . beta carotene w/minerals (OCUVITE) tablet Take 1 tablet by mouth daily.    .  Biotin (BIOTIN 5000) 5 MG CAPS Take by mouth daily.    . clobetasol (TEMOVATE) 0.05 % external solution Apply 1 application topically 2 (two) times daily.    . colchicine 0.6 MG tablet Take 0.6 mg by mouth daily.    Marland Kitchen desloratadine (CLARINEX) 5 MG tablet Take 5 mg by mouth as needed.    . ezetimibe (ZETIA) 10 MG tablet Take 10 mg by mouth daily.    . febuxostat (ULORIC) 40 MG tablet Take 80 mg by mouth daily.    . finasteride (PROSCAR) 5 MG tablet Take 5 mg by mouth daily.    . fish oil-omega-3 fatty acids 1000 MG capsule Take 2 g by mouth 2 (two) times daily.    . Glucosamine-Chondroit-Vit C-Mn (GLUCOSAMINE 1500 COMPLEX) CAPS Take 1 capsule by mouth.    Marland Kitchen guaiFENesin (MUCINEX) 600 MG 12 hr tablet Take 1,200 mg by mouth as needed.    Marland Kitchen levothyroxine (SYNTHROID, LEVOTHROID) 75 MCG tablet Take 88 mcg by mouth daily.     . Multiple Vitamin (MULTIVITAMIN) tablet Take 1 tablet by mouth daily.    . Omega-3-acid Ethyl Esters (OMACOR PO) Take 1 g by mouth 4 (four) times daily.    . pregabalin (LYRICA) 50 MG capsule Take 50 mg by mouth 2 (two) times daily.    . rosuvastatin (CRESTOR) 20 MG tablet Take 20 mg by mouth daily.    . Sildenafil Citrate (VIAGRA PO) Take by mouth as needed.    . terazosin (HYTRIN) 10 MG capsule Take 10 mg by mouth at bedtime.    Marland Kitchen tiotropium (SPIRIVA) 18 MCG inhalation capsule Take 18 mcg by mouth daily.    Marland Kitchen UNABLE TO FIND Take 135 mg by mouth daily. TRIPLIX    . vitamin B-12 (CYANOCOBALAMIN) 1000 MCG tablet Take 1,000 mcg by mouth daily.     No current facility-administered medications on file prior to visit.   Allergies  Allergen Reactions  . Latex Rash  . Ointment, White Rash    Mycins ointment and pills  . Tape Rash    Adhesive tape    ROS: See HPI for pertinent positives and negatives.  Physical Examination  Filed Vitals:   11/06/15 0919  BP: 131/72  Pulse: 54  Temp: 97 F (36.1 C)  TempSrc: Oral  Resp: 14  Height: 5\' 9"  (1.753 m)  Weight: 226 lb  (102.513 kg)  SpO2: 91%   Body mass index is 33.36 kg/(m^2).    General: A&O x 3, WD, obese male.  Pulmonary: Sym exp, good air movt, CTAB, no rales, rhonchi, or wheezing.  Cardiac: Sinus bradycardia, Nl S1, S2, no detected murmur.   Carotid Bruits  Left  Right    Negative  Negative    Aorta is not palpable  Radial pulses are 2+ palpable   VASCULAR EXAM:  LE Pulses  LEFT  RIGHT   FEMORAL  1+ palpable  not palpable, large panus, obese   POPLITEAL  not palpable  not palpable   POSTERIOR TIBIAL  1+palpable  1+ palpable   DORSALIS PEDIS  ANTERIOR TIBIAL  Not palpable  Not palpable    Gastrointestinal: soft, NTND, -G/R, - HSM, - masses palpated, - CVAT B, large panus.  Musculoskeletal: M/S 5/5 throughout, Extremities without ischemic changes, no peripheral edema.  Neurologic: CN 2-12 intact except slight left facial droop only when he smiles, Pain and light touch intact in extremities, Motor exam as listed above.         Non-Invasive Vascular Imaging  AAA Duplex (11/06/2015)  Previous size: 3.55 cm (Date: 10/01/14); 3.84 cm on 09/28/13  Current size:  3.9 cm (Date: 11/06/15)  Medical Decision Making  The patient is a 80 y.o. male who presents with asymptomatic AAA with no increase in size compared to 2014.   Based on this patient's exam and diagnostic studies, the patient will follow up in 1 year  with the following studies: AAA duplex.  Consideration for repair of AAA would be made when the size is 5.5 cm, growth > 1 cm/yr, and symptomatic status.  I emphasized the importance of maximal medical management including strict control of blood pressure, blood glucose, and lipid levels, antiplatelet agents, obtaining regular exercise, and continued cessation of smoking.   The patient was given information about AAA including signs, symptoms, treatment, and how to minimize the risk of enlargement and rupture of aneurysms.    The patient was  advised to call 911 should the patient experience sudden onset abdominal or back pain.   Thank you for allowing [place] to participate in this patient's care.  Clemon Chambers, RN, MSN, FNP-C [facility] [place] Office: [phone]  Clinic Physician: Scot Dock  11/06/2015, 8:56 AM

## 2015-11-06 NOTE — Patient Instructions (Signed)
Abdominal Aortic Aneurysm An aneurysm is a weakened or damaged part of an artery wall that bulges from the normal force of blood pumping through the body. An abdominal aortic aneurysm is an aneurysm that occurs in the lower part of the aorta, the main artery of the body.  The major concern with an abdominal aortic aneurysm is that it can enlarge and burst (rupture) or blood can flow between the layers of the wall of the aorta through a tear (aorticdissection). Both of these conditions can cause bleeding inside the body and can be life threatening unless diagnosed and treated promptly. CAUSES  The exact cause of an abdominal aortic aneurysm is unknown. Some contributing factors are:   A hardening of the arteries caused by the buildup of fat and other substances in the lining of a blood vessel (arteriosclerosis).  Inflammation of the walls of an artery (arteritis).   Connective tissue diseases, such as Marfan syndrome.   Abdominal trauma.   An infection, such as syphilis or staphylococcus, in the wall of the aorta (infectious aortitis) caused by bacteria. RISK FACTORS  Risk factors that contribute to an abdominal aortic aneurysm may include:  Age older than 60 years.   High blood pressure (hypertension).  Male gender.  Ethnicity (white race).  Obesity.  Family history of aneurysm (first degree relatives only).  Tobacco use. PREVENTION  The following healthy lifestyle habits may help decrease your risk of abdominal aortic aneurysm:  Quitting smoking. Smoking can raise your blood pressure and cause arteriosclerosis.  Limiting or avoiding alcohol.  Keeping your blood pressure, blood sugar level, and cholesterol levels within normal limits.  Decreasing your salt intake. In somepeople, too much salt can raise blood pressure and increase your risk of abdominal aortic aneurysm.  Eating a diet low in saturated fats and cholesterol.  Increasing your fiber intake by including  whole grains, vegetables, and fruits in your diet. Eating these foods may help lower blood pressure.  Maintaining a healthy weight.  Staying physically active and exercising regularly. SYMPTOMS  The symptoms of abdominal aortic aneurysm may vary depending on the size and rate of growth of the aneurysm.Most grow slowly and do not have any symptoms. When symptoms do occur, they may include:  Pain (abdomen, side, lower back, or groin). The pain may vary in intensity. A sudden onset of severe pain may indicate that the aneurysm has ruptured.  Feeling full after eating only small amounts of food.  Nausea or vomiting or both.  Feeling a pulsating lump in the abdomen.  Feeling faint or passing out. DIAGNOSIS  Since most unruptured abdominal aortic aneurysms have no symptoms, they are often discovered during diagnostic exams for other conditions. An aneurysm may be found during the following procedures:  Ultrasonography (A one-time screening for abdominal aortic aneurysm by ultrasonography is also recommended for all men aged 65-75 years who have ever smoked).  X-ray exams.  A computed tomography (CT).  Magnetic resonance imaging (MRI).  Angiography or arteriography. TREATMENT  Treatment of an abdominal aortic aneurysm depends on the size of your aneurysm, your age, and risk factors for rupture. Medication to control blood pressure and pain may be used to manage aneurysms smaller than 6 cm. Regular monitoring for enlargement may be recommended by your caregiver if:  The aneurysm is 3-4 cm in size (an annual ultrasonography may be recommended).  The aneurysm is 4-4.5 cm in size (an ultrasonography every 6 months may be recommended).  The aneurysm is larger than 4.5 cm in   size (your caregiver may ask that you be examined by a vascular surgeon). If your aneurysm is larger than 6 cm, surgical repair may be recommended. There are two main methods for repair of an aneurysm:   Endovascular  repair (a minimally invasive surgery). This is done most often.  Open repair. This method is used if an endovascular repair is not possible.   This information is not intended to replace advice given to you by your health care provider. Make sure you discuss any questions you have with your health care provider.   Document Released: 09/16/2005 Document Revised: 04/03/2013 Document Reviewed: 01/06/2013 Elsevier Interactive Patient Education 2016 [employer]  

## 2015-11-27 DIAGNOSIS — Z23 Encounter for immunization: Secondary | ICD-10-CM | POA: Diagnosis not present

## 2015-12-30 DIAGNOSIS — Z0000 Encounter for general adult medical examination without abnormal findings: Secondary | ICD-10-CM | POA: Diagnosis not present

## 2015-12-30 DIAGNOSIS — Z125 Encounter for screening for malignant neoplasm of prostate: Secondary | ICD-10-CM | POA: Diagnosis not present

## 2015-12-30 DIAGNOSIS — E034 Atrophy of thyroid (acquired): Secondary | ICD-10-CM | POA: Diagnosis not present

## 2015-12-30 DIAGNOSIS — E789 Disorder of lipoprotein metabolism, unspecified: Secondary | ICD-10-CM | POA: Diagnosis not present

## 2016-01-06 DIAGNOSIS — M1990 Unspecified osteoarthritis, unspecified site: Secondary | ICD-10-CM | POA: Diagnosis not present

## 2016-01-06 DIAGNOSIS — M25511 Pain in right shoulder: Secondary | ICD-10-CM | POA: Diagnosis not present

## 2016-01-06 DIAGNOSIS — I10 Essential (primary) hypertension: Secondary | ICD-10-CM | POA: Diagnosis not present

## 2016-01-06 DIAGNOSIS — M25519 Pain in unspecified shoulder: Secondary | ICD-10-CM | POA: Diagnosis not present

## 2016-01-06 DIAGNOSIS — M79672 Pain in left foot: Secondary | ICD-10-CM | POA: Diagnosis not present

## 2016-01-06 DIAGNOSIS — E789 Disorder of lipoprotein metabolism, unspecified: Secondary | ICD-10-CM | POA: Diagnosis not present

## 2016-01-22 DIAGNOSIS — M7581 Other shoulder lesions, right shoulder: Secondary | ICD-10-CM | POA: Diagnosis not present

## 2016-01-22 DIAGNOSIS — M150 Primary generalized (osteo)arthritis: Secondary | ICD-10-CM | POA: Diagnosis not present

## 2016-01-22 DIAGNOSIS — M1A09X0 Idiopathic chronic gout, multiple sites, without tophus (tophi): Secondary | ICD-10-CM | POA: Diagnosis not present

## 2016-02-17 DIAGNOSIS — M1A09X0 Idiopathic chronic gout, multiple sites, without tophus (tophi): Secondary | ICD-10-CM | POA: Diagnosis not present

## 2016-02-17 DIAGNOSIS — M7581 Other shoulder lesions, right shoulder: Secondary | ICD-10-CM | POA: Diagnosis not present

## 2016-02-17 DIAGNOSIS — M150 Primary generalized (osteo)arthritis: Secondary | ICD-10-CM | POA: Diagnosis not present

## 2016-02-27 DIAGNOSIS — Z961 Presence of intraocular lens: Secondary | ICD-10-CM | POA: Diagnosis not present

## 2016-02-27 DIAGNOSIS — H353131 Nonexudative age-related macular degeneration, bilateral, early dry stage: Secondary | ICD-10-CM | POA: Diagnosis not present

## 2016-02-27 DIAGNOSIS — H01004 Unspecified blepharitis left upper eyelid: Secondary | ICD-10-CM | POA: Diagnosis not present

## 2016-02-27 DIAGNOSIS — H01001 Unspecified blepharitis right upper eyelid: Secondary | ICD-10-CM | POA: Diagnosis not present

## 2016-02-27 DIAGNOSIS — H01005 Unspecified blepharitis left lower eyelid: Secondary | ICD-10-CM | POA: Diagnosis not present

## 2016-02-27 DIAGNOSIS — H04123 Dry eye syndrome of bilateral lacrimal glands: Secondary | ICD-10-CM | POA: Diagnosis not present

## 2016-02-27 DIAGNOSIS — H01002 Unspecified blepharitis right lower eyelid: Secondary | ICD-10-CM | POA: Diagnosis not present

## 2016-03-14 DIAGNOSIS — M79604 Pain in right leg: Secondary | ICD-10-CM | POA: Diagnosis not present

## 2016-03-14 DIAGNOSIS — J9691 Respiratory failure, unspecified with hypoxia: Secondary | ICD-10-CM | POA: Diagnosis not present

## 2016-03-14 DIAGNOSIS — J189 Pneumonia, unspecified organism: Secondary | ICD-10-CM | POA: Diagnosis not present

## 2016-03-14 DIAGNOSIS — J9601 Acute respiratory failure with hypoxia: Secondary | ICD-10-CM | POA: Diagnosis not present

## 2016-03-14 DIAGNOSIS — M109 Gout, unspecified: Secondary | ICD-10-CM | POA: Diagnosis present

## 2016-03-14 DIAGNOSIS — Z951 Presence of aortocoronary bypass graft: Secondary | ICD-10-CM | POA: Diagnosis not present

## 2016-03-14 DIAGNOSIS — I129 Hypertensive chronic kidney disease with stage 1 through stage 4 chronic kidney disease, or unspecified chronic kidney disease: Secondary | ICD-10-CM | POA: Diagnosis present

## 2016-03-14 DIAGNOSIS — Z87891 Personal history of nicotine dependence: Secondary | ICD-10-CM | POA: Diagnosis not present

## 2016-03-14 DIAGNOSIS — J9602 Acute respiratory failure with hypercapnia: Secondary | ICD-10-CM | POA: Diagnosis not present

## 2016-03-14 DIAGNOSIS — R0602 Shortness of breath: Secondary | ICD-10-CM | POA: Diagnosis not present

## 2016-03-14 DIAGNOSIS — I6789 Other cerebrovascular disease: Secondary | ICD-10-CM | POA: Diagnosis not present

## 2016-03-14 DIAGNOSIS — J441 Chronic obstructive pulmonary disease with (acute) exacerbation: Secondary | ICD-10-CM | POA: Diagnosis not present

## 2016-03-14 DIAGNOSIS — M79605 Pain in left leg: Secondary | ICD-10-CM | POA: Diagnosis not present

## 2016-03-14 DIAGNOSIS — I2510 Atherosclerotic heart disease of native coronary artery without angina pectoris: Secondary | ICD-10-CM | POA: Diagnosis present

## 2016-03-14 DIAGNOSIS — E785 Hyperlipidemia, unspecified: Secondary | ICD-10-CM | POA: Diagnosis not present

## 2016-03-14 DIAGNOSIS — D696 Thrombocytopenia, unspecified: Secondary | ICD-10-CM | POA: Diagnosis not present

## 2016-03-14 DIAGNOSIS — J9811 Atelectasis: Secondary | ICD-10-CM | POA: Diagnosis not present

## 2016-03-14 DIAGNOSIS — D649 Anemia, unspecified: Secondary | ICD-10-CM | POA: Diagnosis present

## 2016-03-14 DIAGNOSIS — J449 Chronic obstructive pulmonary disease, unspecified: Secondary | ICD-10-CM | POA: Diagnosis not present

## 2016-03-14 DIAGNOSIS — N183 Chronic kidney disease, stage 3 (moderate): Secondary | ICD-10-CM | POA: Diagnosis not present

## 2016-03-14 DIAGNOSIS — I10 Essential (primary) hypertension: Secondary | ICD-10-CM | POA: Diagnosis not present

## 2016-03-14 DIAGNOSIS — D72829 Elevated white blood cell count, unspecified: Secondary | ICD-10-CM | POA: Diagnosis not present

## 2016-03-14 DIAGNOSIS — R069 Unspecified abnormalities of breathing: Secondary | ICD-10-CM | POA: Diagnosis not present

## 2016-04-02 ENCOUNTER — Ambulatory Visit (INDEPENDENT_AMBULATORY_CARE_PROVIDER_SITE_OTHER): Payer: Medicare Other | Admitting: Internal Medicine

## 2016-04-02 ENCOUNTER — Encounter: Payer: Self-pay | Admitting: Internal Medicine

## 2016-04-02 ENCOUNTER — Ambulatory Visit (INDEPENDENT_AMBULATORY_CARE_PROVIDER_SITE_OTHER)
Admission: RE | Admit: 2016-04-02 | Discharge: 2016-04-02 | Disposition: A | Discharge status: Home / Self Care | Payer: Medicare Other | Source: Ambulatory Visit | Attending: Internal Medicine | Admitting: Internal Medicine

## 2016-04-02 VITALS — BP 112/60 | HR 64 | Ht 69.0 in | Wt 239.2 lb

## 2016-04-02 DIAGNOSIS — M7989 Other specified soft tissue disorders: Secondary | ICD-10-CM

## 2016-04-02 DIAGNOSIS — J449 Chronic obstructive pulmonary disease, unspecified: Secondary | ICD-10-CM | POA: Diagnosis not present

## 2016-04-02 DIAGNOSIS — J189 Pneumonia, unspecified organism: Secondary | ICD-10-CM | POA: Diagnosis not present

## 2016-04-02 MED ORDER — FUROSEMIDE 20 MG PO TABS: ORAL_TABLET | ORAL | Status: DC

## 2016-04-02 NOTE — Progress Notes (Signed)
Subjective:     Patient ID: Derek [name], male   DOB: 1935/07/31,    MRN: [id number]  HPI  3 yowm quit smoking 2001 "smoker's cough" and wt 210  resolved then started cough again 2008/9 assoc doe > rx  spiriva  helped  But cough continued/ worse in winter then in [place] 03/11/16 admit with pna/copd> back to baseline but referred to pulmonary clinic 04/02/2016 by Dr Wilson Singer ? Copd severity   04/02/2016 1st [facility] office visit/ Javares Kaufhold   Chief Complaint  Patient presents with  . Pulmonary Consult    Referred by Dr. Wilson Singer. Pt states 2 wks ago while in [place], he was hospitalized x 5 days for PNA and COPD exacerbation.  He states that today her breathing is "excellent". He has o2 at home but has not needed to use this.   has proair but not using while on spiriva dpi   Has 02 but not using  Off pred x one week s relapse - legs swelled on pred, some better now Not limited by breathing from desired activities  But not aerobically active = MMRC1 = can walk nl pace, flat grade, can't hurry or go uphills or steps s sob    No obvious day to day or daytime variability or assoc excess/ purulent sputum or mucus plugs or hemoptysis or cp or chest tightness, subjective wheeze or overt sinus or hb symptoms. No unusual exp hx or h/o childhood pna/ asthma or knowledge of premature birth.  Sleeping ok without nocturnal  or early am exacerbation  of respiratory  c/o's or need for noct saba. Also denies any obvious fluctuation of symptoms with weather or environmental changes or other aggravating or alleviating factors except as outlined above   Current Medications, Allergies, Complete Past Medical History, Past Surgical History, Family History, and Social History were reviewed in [employer] record.  ROS  The following are not active complaints unless bolded sore throat, dysphagia, dental problems, itching, sneezing,  nasal congestion or excess/ purulent secretions, ear ache,    fever, chills, sweats, unintended wt loss, classically pleuritic or exertional cp,  orthopnea pnd or leg swelling since starting prednisone, presyncope, palpitations, abdominal pain, anorexia, nausea, vomiting, diarrhea  or change in bowel or bladder habits, change in stools or urine, dysuria,hematuria,  rash, arthralgias, visual complaints, headache, numbness, weakness or ataxia or problems with walking or coordination,  change in mood/affect or memory.         Review of Systems     Objective:   Physical Exam amb obese wm nad  Wt Readings from Last 3 Encounters:  04/02/16 239 lb 3.4 oz (108.505 kg)  11/06/15 226 lb (102.513 kg)  04/02/15 236 lb (107.049 kg)    Vital signs reviewed   HEENT: nl dentition, turbinates, and oropharynx. Nl external ear canals without cough reflex   NECK :  without JVD/Nodes/TM/ nl carotid upstrokes bilaterally   LUNGS: no acc muscle use,  Nl contour chest which is clear to A and P bilaterally without cough on insp or exp maneuvers   CV:  RRR  no s3 or murmur or increase in P2, 1+ bilateral lower ext pitting  edema   ABD: obese  soft and nontender with nl inspiratory excursion in the supine position. No bruits or organomegaly, bowel sounds nl  MS:  Nl gait/ ext warm without deformities, calf tenderness, cyanosis or clubbing No obvious joint restrictions   SKIN: warm and dry without lesions  NEURO:  alert, approp, nl sensorium with  no motor deficits    CXR PA and Lateral:   04/02/2016 :    I personally reviewed images and agree with radiology impression as follows:   Changes of COPD and old granulomatous disease with minimal chronic bibasilar atelectasis versus scarring.        Assessment:

## 2016-04-02 NOTE — Patient Instructions (Addendum)
No change in your therapy for now except ok to take furosemide 20 mg one daily if needed for swelling    Plan A = Automatic = Spiriva one daily each am  Plan B = Backup Only use your albuterol(proair)  as a rescue medication to be used if you can't catch your breath by resting or doing a relaxed purse lip breathing pattern.  - The less you use it, the better it will work when you need it. - Ok to use up to 2 puffs  every 4 hours if you must but call for appointment if use goes up over your usual need - Don't leave home without it !!  (think of it like the spare tire for your car)   Please remember to go to the  x-ray department downstairs for your tests - we will call you with the results when they are available.     Please schedule a follow up office visit in 2 weeks, call sooner if needed with pfts on return bring inhalers with you

## 2016-04-04 DIAGNOSIS — M7989 Other specified soft tissue disorders: Secondary | ICD-10-CM | POA: Insufficient documentation

## 2016-04-04 NOTE — Assessment & Plan Note (Signed)
Attributes to taking prednisone > rx with prn lasix only for now

## 2016-04-04 NOTE — Assessment & Plan Note (Addendum)
  When respiratory symptoms begin  well after a patient reports complete smoking cessation,  Especially when this wasn't the case while they were smoking, a red flag is raised based on the work of Dr Kris Mouton which states:  if you quit smoking when your best day FEV1 is still well preserved it is highly unlikely you will progress to severe disease.  That is to say, once the smoking stops,  the symptoms should not suddenly erupt or markedly worsen.  If so, the differential diagnosis should include  obesity/deconditioning,  LPR/Reflux/Aspiration syndromes,  occult CHF(does have GI diastolic dysfunction by echo in 2015), or  especially side effect of medications commonly used in this population (not on ACEi or BB at present)    In his case wt was only 210 at smoking cessation > gained about 50 lb though now down to 30 lbs over that wt and "feels great" so may not need any resp rx but for now will leave on spiriva and return for pfts before considering any modifications to meds.  I had an extended discussion with the patient reviewing all relevant studies completed to date and  lasting  35 /60 minute initial office visit    Each maintenance medication was reviewed in detail including most importantly the difference between maintenance and prns and under what circumstances the prns are to be triggered using an action plan format that is not reflected in the computer generated alphabetically organized AVS.    Please see instructions for details which were reviewed in writing and the patient given a copy highlighting the part that I personally wrote and discussed at today's ov.

## 2016-04-04 NOTE — Assessment & Plan Note (Signed)
Complicated by HBP/ Hyperlipidemia/ DM   Body mass index is 35.31    Lab Results  Component Value Date   TSH 5.23 02/18/2007     Contributing also  to gerd risk/doe/reviewed the need and the process to achieve and maintain neg calorie balance > defer f/u primary care including intermittently monitoring thyroid status

## 2016-04-06 NOTE — Progress Notes (Signed)
Quick Note:  Spoke with pt and notified of results per Dr. Wert. Pt verbalized understanding and denied any questions.  ______ 

## 2016-04-14 ENCOUNTER — Institutional Professional Consult (permissible substitution): Payer: TRICARE For Life (TFL) | Admitting: Internal Medicine

## 2016-04-17 ENCOUNTER — Ambulatory Visit (HOSPITAL_COMMUNITY)
Admission: RE | Admit: 2016-04-17 | Discharge: 2016-04-17 | Disposition: A | Discharge status: Home / Self Care | Payer: Medicare Other | Source: Ambulatory Visit | Attending: Internal Medicine | Admitting: Internal Medicine

## 2016-04-17 ENCOUNTER — Encounter: Payer: Self-pay | Admitting: Internal Medicine

## 2016-04-17 ENCOUNTER — Ambulatory Visit (INDEPENDENT_AMBULATORY_CARE_PROVIDER_SITE_OTHER): Payer: Medicare Other | Admitting: Internal Medicine

## 2016-04-17 VITALS — BP 100/60 | HR 70 | Ht 69.0 in | Wt 232.0 lb

## 2016-04-17 DIAGNOSIS — J449 Chronic obstructive pulmonary disease, unspecified: Secondary | ICD-10-CM | POA: Diagnosis not present

## 2016-04-17 DIAGNOSIS — M7989 Other specified soft tissue disorders: Secondary | ICD-10-CM | POA: Diagnosis not present

## 2016-04-17 LAB — PULMONARY FUNCTION TEST
DL/VA % pred: 50 %
DL/VA: 2.28 ml/min/mmHg/L
DLCO UNC: 11.82 ml/min/mmHg
DLCO unc % pred: 38 %
FEF 25-75 Post: 0.79 L/sec
FEF 25-75 Pre: 0.63 L/sec
FEF2575-%Change-Post: 25 %
FEF2575-%Pred-Post: 41 %
FEF2575-%Pred-Pre: 32 %
FEV1-%CHANGE-POST: 3 %
FEV1-%PRED-PRE: 69 %
FEV1-%Pred-Post: 72 %
FEV1-POST: 2 L
FEV1-PRE: 1.92 L
FEV1FVC-%Change-Post: 1 %
FEV1FVC-%Pred-Pre: 81 %
FEV6-%Change-Post: 3 %
FEV6-%PRED-POST: 82 %
FEV6-%PRED-PRE: 79 %
FEV6-POST: 2.99 L
FEV6-PRE: 2.89 L
FEV6FVC-%CHANGE-POST: 0 %
FEV6FVC-%PRED-POST: 95 %
FEV6FVC-%PRED-PRE: 94 %
FVC-%Change-Post: 2 %
FVC-%PRED-POST: 86 %
FVC-%PRED-PRE: 83 %
FVC-POST: 3.35 L
FVC-Pre: 3.27 L
PRE FEV6/FVC RATIO: 88 %
Post FEV1/FVC ratio: 60 %
Post FEV6/FVC ratio: 89 %
Pre FEV1/FVC ratio: 59 %
RV % PRED: 124 %
RV: 3.21 L
TLC % PRED: 94 %
TLC: 6.49 L

## 2016-04-17 MED ORDER — ALBUTEROL SULFATE (2.5 MG/3ML) 0.083% IN NEBU
2.5000 mg | INHALATION_SOLUTION | Freq: Once | RESPIRATORY_TRACT | Status: AC
  Administered 2016-04-17: 2.5 mg via RESPIRATORY_TRACT

## 2016-04-17 NOTE — Progress Notes (Signed)
Subjective:     Patient ID: Derek [name], male   DOB: [birth year],    MRN: [id number]    Brief patient profile:  28 yowm quit smoking 2001 "smoker's cough" and wt 210  resolved then started cough again 2008/9 assoc doe > rx  spiriva  helped  But cough continued/ worse in winter then in [place] 03/11/16 admit with pna/copd> back to baseline but referred to pulmonary clinic 04/02/2016 by Dr Wilson Singer ? Copd severity> proved to have GOLD II criteria 04/17/2016     History of Present Illness  04/02/2016 1st [facility] office visit/ Derek [name]   Chief Complaint  Patient presents with  . Pulmonary Consult    Referred by Dr. Wilson Singer. Pt states 2 wks ago while in [place], he was hospitalized x 5 days for PNA and COPD exacerbation.  He states that today her breathing is "excellent". He has o2 at home but has not needed to use this.   has proair but not using while on spiriva dpi   Has 02 but not using  Off pred x one week s relapse - legs swelled on pred, some better off but not resolved  Not limited by breathing from desired activities  But not aerobically active = MMRC1 = can walk nl pace, flat grade, can't hurry or go uphills or steps s sob rec No change   except ok to take furosemide 20 mg one daily if needed for swelling   Plan A = Automatic = Spiriva one daily each am Plan B = Backup Only use your albuterol(proair) as rescue   04/17/2016  f/u ov/Derek [name] re: GOLD II copd by pfts today  Chief Complaint  Patient presents with  . Follow-up    [facility]. Pt states that his breathing is doing well. He has not had to use albutderol inhaler. No new co's today.      no change doe = MMRC1 = can walk nl pace, flat grade, can't hurry or go uphills or steps s sob     No obvious day to day or daytime variability or assoc excess/ purulent sputum or mucus plugs or hemoptysis or cp or chest tightness, subjective wheeze or overt sinus or hb symptoms. No unusual exp hx or h/o childhood pna/ asthma or  knowledge of premature birth.  Sleeping ok without nocturnal  or early am exacerbation  of respiratory  c/o's or need for noct saba. Also denies any obvious fluctuation of symptoms with weather or environmental changes or other aggravating or alleviating factors except as outlined above   Current Medications, Allergies, Complete Past Medical History, Past Surgical History, Family History, and Social History were reviewed in [employer] record.  ROS  The following are not active complaints unless bolded sore throat, dysphagia, dental problems, itching, sneezing,  nasal congestion or excess/ purulent secretions, ear ache,   fever, chills, sweats, unintended wt loss, classically pleuritic or exertional cp,  orthopnea pnd or leg swelling some better on furosemide 20mg , presyncope, palpitations, abdominal pain, anorexia, nausea, vomiting, diarrhea  or change in bowel or bladder habits, change in stools or urine, dysuria,hematuria,  rash, arthralgias, visual complaints, headache, numbness, weakness or ataxia or problems with walking or coordination,  change in mood/affect or memory.              Objective:   Physical Exam amb obese wm nad   04/17/2016        232  04/02/16 239 lb 3.4 oz (108.505  kg)  11/06/15 226 lb (102.513 kg)  04/02/15 236 lb (107.049 kg)    Vital signs reviewed   HEENT: nl dentition, turbinates, and oropharynx. Nl external ear canals without cough reflex   NECK :  without JVD/Nodes/TM/ nl carotid upstrokes bilaterally   LUNGS: no acc muscle use,  Nl contour chest which is clear to A and P bilaterally without cough on insp or exp maneuvers   CV:  RRR  no s3 or murmur or increase in P2,  Trace  bilateral lower ext pitting  edema   ABD: obese  soft and nontender with nl inspiratory excursion in the supine position. No bruits or organomegaly, bowel sounds nl  MS:  Nl gait/ ext warm without deformities, calf tenderness, cyanosis or clubbing No  obvious joint restrictions   SKIN: warm and dry without lesions    NEURO:  alert, approp, nl sensorium with  no motor deficits    CXR PA and Lateral:   04/02/2016 :    I personally reviewed images and agree with radiology impression as follows:   Changes of COPD and old granulomatous disease with minimal chronic bibasilar atelectasis versus scarring.        Assessment:

## 2016-04-17 NOTE — Patient Instructions (Signed)
Spiriva respimat or Stiolto would be better options than spiriva handihaler  Please schedule a follow up visit in 3 months but call sooner if needed

## 2016-04-18 NOTE — Assessment & Plan Note (Signed)
rx prn lasix 20 mg 04/02/2016 >> improved but not resolved, ok to continue low dose furosemide prn > Follow up per Primary Care planned

## 2016-04-18 NOTE — Assessment & Plan Note (Addendum)
Quit smoking 2001  - PFT's  04/17/2016  FEV1 2.00 (72 % ) ratio 60  p no % improvement from saba p no prior to study with DLCO  38 % corrects to 50 % for alv volume    I had an extended summary discussion with the patient reviewing all relevant studies completed to date and  lasting 15 to 20 minutes of a 25 minute visit on the following issues:    I reviewed the Fletcher curve with the patient that basically indicates  if you quit smoking when your best day FEV1 is still well preserved (as is clearly  the case here)  it is highly unlikely you will progress to severe disease and informed the patient there was no medication on the market that has proven to alter the curve/ its downward trajectory  or the likelihood of progression of their disease.  Therefore stopping smoking and maintaining abstinence is the most important aspect of care, not choice of inhalers or for that matter, doctors.    Treatment is directed at symptoms and I prefer the respimat over the dpi as it causes less side effects but may not be available thru the [place] restrictions will be an ongoing challenge for the forseable future and I would be happy to pick an alternative if the pt will first  provide me a list of them but pt  will need to return here for training for any new device that is required eg dpi vs hfa vs respimat.    In meantime we can always provide samples so the patient never runs out of any needed respiratory medications.

## 2016-04-28 ENCOUNTER — Other Ambulatory Visit: Payer: Self-pay

## 2016-04-28 MED ORDER — ALBUTEROL SULFATE HFA 108 (90 BASE) MCG/ACT IN AERS: 2.0000 | INHALATION_SPRAY | Freq: Four times a day (QID) | RESPIRATORY_TRACT | Status: DC | PRN

## 2016-06-29 DIAGNOSIS — E789 Disorder of lipoprotein metabolism, unspecified: Secondary | ICD-10-CM | POA: Diagnosis not present

## 2016-06-29 DIAGNOSIS — I10 Essential (primary) hypertension: Secondary | ICD-10-CM | POA: Diagnosis not present

## 2016-06-29 DIAGNOSIS — Z79899 Other long term (current) drug therapy: Secondary | ICD-10-CM | POA: Diagnosis not present

## 2016-06-29 DIAGNOSIS — E034 Atrophy of thyroid (acquired): Secondary | ICD-10-CM | POA: Diagnosis not present

## 2016-07-06 DIAGNOSIS — R0602 Shortness of breath: Secondary | ICD-10-CM | POA: Diagnosis not present

## 2016-07-06 DIAGNOSIS — J42 Unspecified chronic bronchitis: Secondary | ICD-10-CM | POA: Diagnosis not present

## 2016-07-13 DIAGNOSIS — I10 Essential (primary) hypertension: Secondary | ICD-10-CM | POA: Diagnosis not present

## 2016-07-13 DIAGNOSIS — E032 Hypothyroidism due to medicaments and other exogenous substances: Secondary | ICD-10-CM | POA: Diagnosis not present

## 2016-07-13 DIAGNOSIS — R0689 Other abnormalities of breathing: Secondary | ICD-10-CM | POA: Diagnosis not present

## 2016-07-27 ENCOUNTER — Ambulatory Visit (INDEPENDENT_AMBULATORY_CARE_PROVIDER_SITE_OTHER): Payer: Medicare Other | Admitting: Internal Medicine

## 2016-07-27 ENCOUNTER — Encounter: Payer: Self-pay | Admitting: Internal Medicine

## 2016-07-27 VITALS — BP 122/60 | HR 63 | Ht 69.0 in | Wt 230.4 lb

## 2016-07-27 DIAGNOSIS — J449 Chronic obstructive pulmonary disease, unspecified: Secondary | ICD-10-CM

## 2016-07-27 MED ORDER — FLUTICASONE-SALMETEROL 250-50 MCG/DOSE IN AEPB: INHALATION_SPRAY | RESPIRATORY_TRACT | 3 refills | Status: DC

## 2016-07-27 NOTE — Progress Notes (Signed)
Subjective:     Patient ID: Derek [name], male   DOB: Feb 04, 1935,    MRN: [id number]    Brief patient profile:  56 yowm quit smoking 2001 "smoker's cough" and wt 210  resolved then started cough again 2008/9 assoc doe > rx  spiriva  helped  But cough continued/ worse in winter then in [place] 03/11/16 admit with pna/copd> back to baseline but referred to pulmonary clinic 04/02/2016 by Dr Wilson Singer ? Copd severity> proved to have GOLD II criteria 04/17/2016     History of Present Illness  04/02/2016 1st [facility] office visit/ Derek [name]   Chief Complaint  Patient presents with  . Pulmonary Consult    Referred by Dr. Wilson Singer. Pt states 2 wks ago while in [place], he was hospitalized x 5 days for PNA and COPD exacerbation.  He states that today her breathing is "excellent". He has o2 at home but has not needed to use this.   has proair but not using while on spiriva dpi   Has 02 but not using  Off pred x one week s relapse - legs swelled on pred, some better off but not resolved  Not limited by breathing from desired activities  But not aerobically active = MMRC1 = can walk nl pace, flat grade, can't hurry or go uphills or steps s sob rec No change   except ok to take furosemide 20 mg one daily if needed for swelling   Plan A = Automatic = Spiriva one daily each am Plan B = Backup Only use your albuterol(proair) as rescue   04/17/2016  f/u ov/Derek [name] re: GOLD II copd by pfts today  Chief Complaint  Patient presents with  . Follow-up    [facility] done [facility] at [facility]. Pt states that his breathing is doing well. He has not had to use albutderol inhaler. No new co's today.   no change doe = MMRC1 = can walk nl pace, flat grade, can't hurry or go uphills or steps s sob   rec Spiriva respimat or Stiolto would be better options than spiriva handihaler    07/27/2016  f/u ov/Derek [name] re: GOLD II  COPD/ ab pattern since hot weather  x1 m better p adding spiriva  Chief Complaint  Patient presents with  . Follow-up     Pt reports breathing is stable. Denies any wheezing/chest tightness.   Was also needing saba at [facility] but did not call for eval / all symptoms resolved on advair 100/50 one bid including noct and heat intol related  And back to [facility] 1 doe   No obvious day to day or daytime variability or assoc excess/ purulent sputum or mucus plugs or hemoptysis or cp or chest tightness, subjective wheeze or overt sinus or hb symptoms. No unusual exp hx or h/o childhood pna/ asthma or knowledge of premature birth.  Sleeping ok without nocturnal  or early am exacerbation  of respiratory  c/o's or need for noct saba. Also denies any obvious fluctuation of symptoms with weather or environmental changes or other aggravating or alleviating factors except as outlined above   Current Medications, Allergies, Complete Past Medical History, Past Surgical History, Family History, and Social History were reviewed in [employer] record.  ROS  The following are not active complaints unless bolded sore throat, dysphagia, dental problems, itching, sneezing,  nasal congestion or excess/ purulent secretions, ear ache,   fever, chills, sweats, unintended wt loss, classically pleuritic or exertional cp,  orthopnea pnd or  leg swelling improved on furosemide 20mg , presyncope, palpitations, abdominal pain, anorexia, nausea, vomiting, diarrhea  or change in bowel or bladder habits, change in stools or urine, dysuria,hematuria,  rash, arthralgias, visual complaints, headache, numbness, weakness or ataxia or problems with walking or coordination,  change in mood/affect or memory.              Objective:   Physical Exam amb obese wm nad   07/27/2016          230  04/17/2016        232  04/02/16 239 lb 3.4 oz (108.505 kg)  11/06/15 226 lb (102.513 kg)  04/02/15 236 lb (107.049 kg)    Vital signs reviewed   HEENT: nl dentition, turbinates, and oropharynx. Nl external ear canals without cough reflex   NECK :   without JVD/Nodes/TM/ nl carotid upstrokes bilaterally   LUNGS: no acc muscle use,  Nl contour chest which is clear to A and P bilaterally without cough on insp or exp maneuvers   CV:  RRR  no s3 or murmur or increase in P2,  Trace  bilateral lower ext pitting  edema   ABD: obese  soft and nontender with nl inspiratory excursion in the supine position. No bruits or organomegaly, bowel sounds nl  MS:  Nl gait/ ext warm without deformities, calf tenderness, cyanosis or clubbing No obvious joint restrictions   SKIN: warm and dry without lesions    NEURO:  alert, approp, nl sensorium with  no motor deficits    CXR PA and Lateral:   04/02/2016 :    I personally reviewed images and agree with radiology impression as follows:   Changes of COPD and old granulomatous disease with minimal chronic bibasilar atelectasis versus scarring.        Assessment:

## 2016-07-27 NOTE — Assessment & Plan Note (Signed)
Quit smoking 2001  - PFT's  04/17/2016  FEV1 2.00 (72 % ) ratio 60  p no % improvement from saba p no prior to study with DLCO  38 % corrects to 50 % for alv volume   - 7//2017 worse on spiriva monotherapy > added advair 100 > ov 07/27/2016 changed to advair 250 - 07/27/2016  After extensive coaching HFA effectiveness =    90%   Clearly flaired on lama and now on lama/laba/ics so may turn out he just needs the laba/ics but for now will rx x 3 m triple combo then leave off the lama x one week prior to ov to see what difference if any he notes in ex tol  I had an extended discussion with the patient reviewing all relevant studies completed to date and  lasting 15 to 20 minutes of a 25 minute visit    Each maintenance medication was reviewed in detail including most importantly the difference between maintenance and prns and under what circumstances the prns are to be triggered using an action plan format that is not reflected in the computer generated alphabetically organized AVS.    Please see instructions for details which were reviewed in writing and the patient given a copy highlighting the part that I personally wrote and discussed at today's ov.

## 2016-07-27 NOTE — Patient Instructions (Addendum)
Plan A = Automatic = Advair 250/50 one twice daily/ Spiriva one daily each am(like high octane fuel)   Plan B = Backup Only use your albuterol(proair)  as a rescue medication to be used if you can't catch your breath by resting or doing a relaxed purse lip breathing pattern.  - The less you use it, the better it will work when you need it. - Ok to use up to 2 puffs  every 4 hours if you must but call for appointment if use goes up over your usual need - Don't leave home without it !!  (think of it like the spare tire for your car)   Please schedule a follow up visit in 3 months but call sooner if needed - ok to leave off spiriva x one week prior

## 2016-07-29 DIAGNOSIS — D0439 Carcinoma in situ of skin of other parts of face: Secondary | ICD-10-CM | POA: Diagnosis not present

## 2016-07-29 DIAGNOSIS — X32XXXA Exposure to sunlight, initial encounter: Secondary | ICD-10-CM | POA: Diagnosis not present

## 2016-07-29 DIAGNOSIS — D225 Melanocytic nevi of trunk: Secondary | ICD-10-CM | POA: Diagnosis not present

## 2016-07-29 DIAGNOSIS — L570 Actinic keratosis: Secondary | ICD-10-CM | POA: Diagnosis not present

## 2016-08-17 DIAGNOSIS — M1A09X0 Idiopathic chronic gout, multiple sites, without tophus (tophi): Secondary | ICD-10-CM | POA: Diagnosis not present

## 2016-08-17 DIAGNOSIS — M7581 Other shoulder lesions, right shoulder: Secondary | ICD-10-CM | POA: Diagnosis not present

## 2016-08-17 DIAGNOSIS — M150 Primary generalized (osteo)arthritis: Secondary | ICD-10-CM | POA: Diagnosis not present

## 2016-08-17 DIAGNOSIS — Z79899 Other long term (current) drug therapy: Secondary | ICD-10-CM | POA: Diagnosis not present

## 2016-08-19 DIAGNOSIS — Z08 Encounter for follow-up examination after completed treatment for malignant neoplasm: Secondary | ICD-10-CM | POA: Diagnosis not present

## 2016-08-19 DIAGNOSIS — L570 Actinic keratosis: Secondary | ICD-10-CM | POA: Diagnosis not present

## 2016-08-19 DIAGNOSIS — X32XXXD Exposure to sunlight, subsequent encounter: Secondary | ICD-10-CM | POA: Diagnosis not present

## 2016-08-19 DIAGNOSIS — Z85828 Personal history of other malignant neoplasm of skin: Secondary | ICD-10-CM | POA: Diagnosis not present

## 2016-08-19 DIAGNOSIS — L218 Other seborrheic dermatitis: Secondary | ICD-10-CM | POA: Diagnosis not present

## 2016-08-25 ENCOUNTER — Other Ambulatory Visit: Payer: Self-pay

## 2016-10-27 ENCOUNTER — Ambulatory Visit (INDEPENDENT_AMBULATORY_CARE_PROVIDER_SITE_OTHER): Payer: Medicare Other | Admitting: Internal Medicine

## 2016-10-27 ENCOUNTER — Encounter: Payer: Self-pay | Admitting: Internal Medicine

## 2016-10-27 VITALS — BP 124/64 | HR 79 | Ht 69.0 in | Wt 234.6 lb

## 2016-10-27 DIAGNOSIS — J449 Chronic obstructive pulmonary disease, unspecified: Secondary | ICD-10-CM | POA: Diagnosis not present

## 2016-10-27 DIAGNOSIS — R0602 Shortness of breath: Secondary | ICD-10-CM | POA: Diagnosis not present

## 2016-10-27 DIAGNOSIS — R0689 Other abnormalities of breathing: Secondary | ICD-10-CM | POA: Diagnosis not present

## 2016-10-27 DIAGNOSIS — M7989 Other specified soft tissue disorders: Secondary | ICD-10-CM | POA: Diagnosis not present

## 2016-10-27 DIAGNOSIS — G629 Polyneuropathy, unspecified: Secondary | ICD-10-CM | POA: Diagnosis not present

## 2016-10-27 DIAGNOSIS — L300 Nummular dermatitis: Secondary | ICD-10-CM | POA: Diagnosis not present

## 2016-10-27 DIAGNOSIS — I10 Essential (primary) hypertension: Secondary | ICD-10-CM | POA: Diagnosis not present

## 2016-10-27 DIAGNOSIS — R21 Rash and other nonspecific skin eruption: Secondary | ICD-10-CM | POA: Diagnosis not present

## 2016-10-27 MED ORDER — FUROSEMIDE 20 MG PO TABS: ORAL_TABLET | ORAL | 2 refills | Status: DC

## 2016-10-27 NOTE — Assessment & Plan Note (Signed)
Quit smoking 2001  - PFT's  04/17/2016  FEV1 2.00 (72 % ) ratio 60  p no % improvement from saba p no prior to study with DLCO  38 % corrects to 50 % for alv volume   - 7//2017 worse on spiriva monotherapy > added advair 100 > ov 07/27/2016 changed to advair 250 - 07/27/2016  After extensive coaching HFA effectiveness =    90%   Goals of rx met = adequate ex tol, min saba need, no exac on laba/lama/ics and very affordable thru present plan and not inclined to change rx for now so no changes needed  I had an extended discussion with the patient reviewing all relevant studies completed to date and  lasting 15 to 20 minutes of a 25 minute visit    Each maintenance medication was reviewed in detail including most importantly the difference between maintenance and prns and under what circumstances the prns are to be triggered using an action plan format that is not reflected in the computer generated alphabetically organized AVS.    Please see instructions for details which were reviewed in writing and the patient given a copy highlighting the part that I personally wrote and discussed at today's ov.

## 2016-10-27 NOTE — Progress Notes (Signed)
Subjective:     Patient ID: Derek [name], male   DOB: [birth year],    MRN: [id number]    Brief patient profile:  53 yowm quit smoking 2001 "smoker's cough" and wt 210  resolved then started cough again 2008/9 assoc doe > rx  spiriva  helped  But cough continued/ worse in winter then in [place] 03/11/16 admit with pna/copd> back to baseline but referred to pulmonary clinic 04/02/2016 by Dr Wilson Singer ? Copd severity> proved to have GOLD II criteria 04/17/2016     History of Present Illness  04/02/2016 1st [facility] office visit/ Jermya Dowding   Chief Complaint  Patient presents with  . Pulmonary Consult    Referred by Dr. Wilson Singer. Pt states 2 wks ago while in [place], he was hospitalized x 5 days for PNA and COPD exacerbation.  He states that today her breathing is "excellent". He has o2 at home but has not needed to use this.   has proair but not using while on spiriva dpi   Has 02 but not using  Off pred x one week s relapse - legs swelled on pred, some better off but not resolved  Not limited by breathing from desired activities  But not aerobically active = MMRC1 = can walk nl pace, flat grade, can't hurry or go uphills or steps s sob rec No change   except ok to take furosemide 20 mg one daily if needed for swelling   Plan A = Automatic = Spiriva one daily each am Plan B = Backup Only use your albuterol(proair) as rescue   04/17/2016  f/u ov/Kaiyon Hynes re: GOLD II copd by pfts today  Chief Complaint  Patient presents with  . Follow-up    PFT's done today at [facility]. Pt states that his breathing is doing well. He has not had to use albutderol inhaler. No new co's today.   no change doe = MMRC1 = can walk nl pace, flat grade, can't hurry or go uphills or steps s sob   rec Spiriva respimat or Stiolto would be better options than spiriva handihaler    07/27/2016  f/u ov/Lucianne Smestad re: GOLD II  COPD/ ab pattern since hot weather  x1 m better p adding spiriva  Chief Complaint  Patient presents with  . Follow-up     Pt reports breathing is stable. Denies any wheezing/chest tightness.   Was also needing saba at hs but did not call for eval / all symptoms resolved on advair 100/50 one bid including noct and heat intol related  And back to [facility] 1 doe  rec Plan A = Automatic = Advair 250/50 one twice daily/ Spiriva one daily each am(like high octane fuel)  Plan B = Backup Only use your albuterol(proair)  as a rescue medication  - ok to leave off spiriva x one week prior to next ov   10/27/2016  f/u ov/Celise Bazar re:  GOLD II copd/ advair 250/ spiriva dpi  Chief Complaint  Patient presents with  . Follow-up    Breathing is doing well. He has only used rescue inhaler x 1 since his last visit.      doe = MMRC1 = can walk nl pace, flat grade, can't hurry or go uphills or steps s sob     No obvious day to day or daytime variability or assoc excess/ purulent sputum or mucus plugs or hemoptysis or cp or chest tightness, subjective wheeze or overt sinus or hb symptoms. No unusual exp hx or h/o childhood  pna/ asthma or knowledge of premature birth.  Sleeping ok without nocturnal  or early am exacerbation  of respiratory  c/o's or need for noct saba. Also denies any obvious fluctuation of symptoms with weather or environmental changes or other aggravating or alleviating factors except as outlined above   Current Medications, Allergies, Complete Past Medical History, Past Surgical History, Family History, and Social History were reviewed in [employer] record.  ROS  The following are not active complaints unless bolded sore throat, dysphagia, dental problems, itching, sneezing,  nasal congestion or excess/ purulent secretions, ear ache,   fever, chills, sweats, unintended wt loss, classically pleuritic or exertional cp,  orthopnea pnd or leg swelling improved on furosemide 20mg , presyncope, palpitations, abdominal pain, anorexia, nausea, vomiting, diarrhea  or change in bowel or bladder habits,  change in stools or urine, dysuria,hematuria,  rash, arthralgias, visual complaints, headache, numbness, weakness or ataxia or problems with walking or coordination,  change in mood/affect or memory.              Objective:   Physical Exam   amb obese wm nad  10/27/2016        234  07/27/2016          230  04/17/2016        232  04/02/16 239 lb 3.4 oz (108.505 kg)  11/06/15 226 lb (102.513 kg)  04/02/15 236 lb (107.049 kg)    Vital signs reviewed  - Note on arrival 02 sats  93% on RA     HEENT: nl dentition, turbinates, and oropharynx. Nl external ear canals without cough reflex   NECK :  without JVD/Nodes/TM/ nl carotid upstrokes bilaterally   LUNGS: no acc muscle use,  Nl contour chest which is clear to A and P bilaterally without cough on insp or exp maneuvers   CV:  RRR  no s3 or murmur or increase in P2,  Trace  bilateral lower ext pitting  edema   ABD: obese  soft and nontender with nl inspiratory excursion in the supine position. No bruits or organomegaly, bowel sounds nl  MS:  Nl gait/ ext warm without deformities, calf tenderness, cyanosis or clubbing No obvious joint restrictions   SKIN: warm and dry without lesions    NEURO:  alert, approp, nl sensorium with  no motor deficits    CXR PA and Lateral:   04/02/2016 :    I personally reviewed images and agree with radiology impression as follows:   Changes of COPD and old granulomatous disease with minimal chronic bibasilar atelectasis versus scarring.        Assessment:

## 2016-10-27 NOTE — Assessment & Plan Note (Signed)
Complicated by HBP/ Hyperlipidemia/ DM   Body mass index is 34.64  Trending up  Lab Results  Component Value Date   TSH 5.23 02/18/2007     Contributing to gerd tendency/ doe/reviewed the need and the process to achieve and maintain neg calorie balance > defer f/u primary care including intermittently monitoring thyroid status

## 2016-10-27 NOTE — Assessment & Plan Note (Signed)
Improved on min lasix > continue 20 mg daily prn and work on wt loss (see separate a/p)

## 2016-10-27 NOTE — Patient Instructions (Signed)
Continue to take the lasix (furosemide) 20 mg one daily if needed for swelling  Please schedule a follow up visit in 6  months but call sooner if needed

## 2016-10-30 ENCOUNTER — Encounter: Payer: Self-pay | Admitting: Family

## 2016-11-05 ENCOUNTER — Encounter: Payer: Self-pay | Admitting: Family

## 2016-11-05 ENCOUNTER — Ambulatory Visit (HOSPITAL_COMMUNITY)
Admission: RE | Admit: 2016-11-05 | Discharge: 2016-11-05 | Disposition: A | Discharge status: Home / Self Care | Payer: Medicare Other | Source: Ambulatory Visit | Attending: Family | Admitting: Family

## 2016-11-05 ENCOUNTER — Ambulatory Visit (INDEPENDENT_AMBULATORY_CARE_PROVIDER_SITE_OTHER): Payer: Medicare Other | Admitting: Family

## 2016-11-05 VITALS — BP 134/70 | HR 55 | Temp 97.1°F | Resp 18 | Ht 69.0 in | Wt 226.0 lb

## 2016-11-05 DIAGNOSIS — I714 Abdominal aortic aneurysm, without rupture, unspecified: Secondary | ICD-10-CM

## 2016-11-05 DIAGNOSIS — Z87891 Personal history of nicotine dependence: Secondary | ICD-10-CM

## 2016-11-05 NOTE — Progress Notes (Signed)
[facility] [place]   CC: Follow up Abdominal Aortic Aneurysm  History of Present Illness  Derek [name] is a 81 y.o. (1935-05-16) male patient that Dr. Oneida Alar has been following and presents today for evaluation of abdominal aortic aneurysm.  Previous studies demonstrate an AAA, measuring 3.84 cm on 09/28/13.   The patient does have chronic back pain, his AAA was found as part of evaluation of his sciatica. He states occasional mild LLQ abdominal pain but no acute abdominal pain.   He admits to being sedentary; encouraged him to do some moderate exercise at least 30 min./day, at least 5 days/week, states he has exercises given to him after a course of physical therapy, states he rarely does them. I encourage him to resume these.    The patient denies claudication in legs with walking, his sciatica is in his left leg which is relieved by Lyrica.   The patient reports in 1997 that he might have had a TIA manifested as unilateral facial drooping, drooling, feeling tired; he was evaluated at the time and does not know the results; no further TIA episodes.  He reports dyspnea with exertion, attributes to hx of smoking for 55 years, quit in 2001; this is his primary barrier to walking or physical activity. He had physical therapy for pain in both hips, does not know if this is from OA or lumbar spine etiology; his walking is also limited by bilateral hips pain that seems to radiate down both legs. He states Lyrica helps this pain a great deal.   He had 5 vessel CABG in 2007, states he is doing well enough that he no longer needs to see his cardiologist.   He was diagnosed with asthma in addition to his COPD.  He takes a daily 81 mg ASA, daily statin, and no other antiplatelet agents nor anticoagulants.  Pt Diabetic: No Pt smoker: former smoker, quit in 2001  Past Medical History:  Diagnosis Date  . AAA (abdominal aortic aneurysm) ([place])   . Arthritis   . COPD  (chronic obstructive pulmonary disease) ([place])   . Coronary artery disease   . Hypertension   . Hypothyroidism   . Myocardial infarction   . Shortness of breath    on exertion;  has productive cough at times  . Sleep apnea    states mild . no cpap   Past Surgical History:  Procedure Laterality Date  . APPENDECTOMY  1963  . CATARACT EXTRACTION     w/ lid rise  . CORONARY ARTERY BYPASS GRAFT  2007  . EYE SURGERY Right March 2016   Cataract  . EYE SURGERY Bilateral May 2016   Eyebrow Lift  . EYE SURGERY Left June 2016   Cataract  . KNEE ARTHROSCOPY  02/26/2012   Procedure: ARTHROSCOPY KNEE;  Surgeon: Sydnee Cabal, MD;  Location: [facility];  Service: Orthopedics;  Laterality: Left;  medial menisectomy and chondraplasty   Social History Social History   Social History  . Marital status: Divorced    Spouse name: N/A  . Number of children: N/A  . Years of education: 12+   Occupational History  . Retired     Social History Main Topics  . Smoking status: Former Smoker    Packs/day: 0.75    Years: 51.00    Types: Cigarettes    Quit date: 02/23/2000  . Smokeless tobacco: Never Used  . Alcohol use 0.0 - 0.6 oz/week     Comment: Moderate  to little  . Drug use: No  . Sexual activity: Not on file   Other Topics Concern  . Not on file   Social History Narrative   Patient lives at home alone    Patient is retired    Patient has 3 years of college       Family History Family History  Problem Relation Age of Onset  . Diabetes Mother   . Heart disease Mother     Before age 6  . Diabetes Sister   . Hypertension Sister   . Diabetes Brother   . Hypertension Brother   . Hyperlipidemia Brother   . Heart attack Daughter   . Hypertension Son     Current Outpatient Prescriptions on File Prior to Visit  Medication Sig Dispense Refill  . albuterol (PROAIR HFA) 108 (90 Base) MCG/ACT inhaler Inhale 2 puffs into the lungs every 6 (six) hours as needed for  wheezing or shortness of breath. 3 Inhaler 2  . amLODipine-valsartan (EXFORGE) 5-160 MG per tablet Take 1 tablet by mouth daily.    Marland Kitchen aspirin 81 MG tablet Take 81 mg by mouth daily.    . Biotin (BIOTIN 5000) 5 MG CAPS Take by mouth daily.    . Choline Fenofibrate (TRILIPIX) 135 MG capsule Take 135 mg by mouth daily.    Marland Kitchen ezetimibe (ZETIA) 10 MG tablet Take 10 mg by mouth daily.    . famotidine (PEPCID) 20 MG tablet Take 20 mg by mouth as needed for heartburn or indigestion.    . febuxostat (ULORIC) 40 MG tablet Take 80 mg by mouth daily.    . fish oil-omega-3 fatty acids 1000 MG capsule Take 2 g by mouth 2 (two) times daily.    . Fluticasone-Salmeterol (ADVAIR DISKUS) 250-50 MCG/DOSE AEPB One twice daily 3 each 3  . furosemide (LASIX) 20 MG tablet One daily as needed for swelling 90 tablet 2  . Glucosamine-Chondroit-Vit C-Mn (GLUCOSAMINE 1500 COMPLEX) CAPS Take 1 capsule by mouth.    . levothyroxine (SYNTHROID, LEVOTHROID) 75 MCG tablet Take 88 mcg by mouth daily.     Marland Kitchen MELATONIN PO Take 1 tablet by mouth at bedtime as needed.    . Multiple Vitamin (MULTIVITAMIN) tablet Take 1 tablet by mouth daily.    . Omega-3-acid Ethyl Esters (OMACOR PO) Take 1 g by mouth 4 (four) times daily.    . pregabalin (LYRICA) 50 MG capsule Take 50 mg by mouth 2 (two) times daily.    . rosuvastatin (CRESTOR) 20 MG tablet Take 20 mg by mouth daily.    Marland Kitchen terazosin (HYTRIN) 10 MG capsule Take 10 mg by mouth 2 (two) times daily.     Marland Kitchen tiotropium (SPIRIVA) 18 MCG inhalation capsule Take 18 mcg by mouth daily.    . vitamin B-12 (CYANOCOBALAMIN) 1000 MCG tablet Take 1,000 mcg by mouth daily.     No current facility-administered medications on file prior to visit.    Allergies  Allergen Reactions  . Latex Rash  . Ointment, White Rash    Mycins ointment and pills  . Tape Rash    Adhesive tape    ROS: See HPI for pertinent positives and negatives.  Physical Examination  Vitals:   11/05/16 0906  BP: 134/70   Pulse: (!) 55  Resp: 18  Temp: 97.1 F (36.2 C)  SpO2: 94%  Weight: 226 lb (102.5 kg)  Height: 5\' 9"  (1.753 m)   Body mass index is 33.37 kg/m.  General: A&O x 3, WD,  obese male.  Pulmonary: Sym exp, respirations are non labored, good air movt, CTAB, no rales, rhonchi, or wheezing.  Cardiac: Sinus bradycardia, Nl S1, S2, no detected murmur.   Carotid Bruits  Left  Right    Negative  Negative    Aorta is not palpable  Radial pulses are 2+ palpable   VASCULAR EXAM:  LE Pulses  LEFT  RIGHT   FEMORAL  1+ palpable  not palpable, large panus, obese   POPLITEAL  not palpable  not palpable   POSTERIOR TIBIAL  1+palpable  1+ palpable   DORSALIS PEDIS  ANTERIOR TIBIAL  Not palpable  Not palpable    Gastrointestinal: soft, NTND, -G/R, - HSM, - masses palpated, - CVAT B, large panus.  Musculoskeletal: M/S 5/5 throughout, Extremities without ischemic changes, no peripheral edema.  Neurologic: CN 2-12 intact except slight left facial droop only when he smiles, Pain and light touch intact in extremities, Motor exam as listed above.   Non-Invasive Vascular Imaging  AAA Duplex (11/05/2016)  Previous size: 3.9 cm (Date: 11/06/15)   Current size:  4.2 cm (Date: 11-05-16)  Medical Decision Making  The patient is a 81 y.o. male who presents with asymptomatic AAA with a slight increase in size, based on limited visualization.   Based on this patient's exam and diagnostic studies, the patient will follow up in 1 year  with the following studies: AAA duplex.  Consideration for repair of AAA would be made when the size is 5.0 cm, growth > 1 cm/yr, and symptomatic status.  I emphasized the importance of maximal medical management including strict control of blood pressure, blood glucose, and lipid levels, antiplatelet agents, obtaining regular exercise, and continued cessation of smoking.   The patient was given information about AAA including signs,  symptoms, treatment, and how to minimize the risk of enlargement and rupture of aneurysms.    The patient was advised to call 911 should the patient experience sudden onset abdominal or back pain.   Thank you for allowing [place] to participate in this patient's care.  Clemon Chambers, RN, MSN, FNP-C [facility] [place] Office: [phone]  Clinic Physician: Oneida Alar  11/05/2016, 9:23 AM

## 2016-11-05 NOTE — Patient Instructions (Addendum)
Abdominal Aortic Aneurysm Blood pumps away from the heart through tubes (blood vessels) called arteries. Aneurysms are weak or damaged places in the wall of an artery. It bulges out like a balloon. An abdominal aortic aneurysm happens in the main artery of the body (aorta). It can burst or tear, causing bleeding inside the body. This is an emergency. It needs treatment right away. What are the causes? The exact cause is unknown. Things that could cause this problem include:  Fat and other substances building up in the lining of a tube.  Swelling of the walls of a blood vessel.  Certain tissue diseases.  Belly (abdominal) trauma.  An infection in the main artery of the body. What increases the risk? There are things that make it more likely for you to have an aneurysm. These include:  Being over the age of 79 years old.  Having high blood pressure (hypertension).  Being a male.  Being white.  Being very overweight (obese).  Having a family history of aneurysm.  Using tobacco products. What are the signs or symptoms? Symptoms depend on the size of the aneurysm and how fast it grows. There may not be symptoms. If symptoms occur, they can include:  Pain (belly, side, lower back, or groin).  Feeling full after eating a small amount of food.  Feeling sick to your stomach (nauseous), throwing up (vomiting), or both.  Feeling a lump in your belly that feels like it is beating (pulsating).  Feeling like you will pass out (faint). How is this treated?  Medicine to control blood pressure and pain.  Imaging tests to see if the aneurysm gets bigger.  Surgery. How is this prevented? To lessen your chance of getting this condition:  Stop smoking. Stop chewing tobacco.  Limit or avoid alcohol.  Keep your blood pressure, blood sugar, and cholesterol within normal limits.  Eat less salt.  Eat foods low in saturated fats and cholesterol. These are found in animal and whole  dairy products.  Eat more fiber. Fiber is found in whole grains, vegetables, and fruits.  Keep a healthy weight.  Stay active and exercise often. This information is not intended to replace advice given to you by your health care provider. Make sure you discuss any questions you have with your health care provider. Document Released: 04/03/2013 Document Revised: 05/14/2016 Document Reviewed: 01/06/2013 Elsevier Interactive Patient Education  2017 Elsevier Inc.      Before your next abdominal ultrasound:  Take two Extra-Strength Gas-X capsules at bedtime the night before the test. Take another two Extra-Strength Gas-X capsules 3 hours before the test.   

## 2016-11-09 NOTE — Addendum Note (Signed)
Addended by: Lianne Cure A on: 11/09/2016 09:08 AM   Modules accepted: Orders

## 2016-11-17 DIAGNOSIS — Z23 Encounter for immunization: Secondary | ICD-10-CM | POA: Diagnosis not present

## 2017-01-07 DIAGNOSIS — Z125 Encounter for screening for malignant neoplasm of prostate: Secondary | ICD-10-CM | POA: Diagnosis not present

## 2017-01-07 DIAGNOSIS — I10 Essential (primary) hypertension: Secondary | ICD-10-CM | POA: Diagnosis not present

## 2017-01-07 DIAGNOSIS — E034 Atrophy of thyroid (acquired): Secondary | ICD-10-CM | POA: Diagnosis not present

## 2017-01-07 DIAGNOSIS — E789 Disorder of lipoprotein metabolism, unspecified: Secondary | ICD-10-CM | POA: Diagnosis not present

## 2017-01-14 DIAGNOSIS — N289 Disorder of kidney and ureter, unspecified: Secondary | ICD-10-CM | POA: Diagnosis not present

## 2017-01-14 DIAGNOSIS — E118 Type 2 diabetes mellitus with unspecified complications: Secondary | ICD-10-CM | POA: Diagnosis not present

## 2017-01-14 DIAGNOSIS — E612 Magnesium deficiency: Secondary | ICD-10-CM | POA: Diagnosis not present

## 2017-01-14 DIAGNOSIS — E032 Hypothyroidism due to medicaments and other exogenous substances: Secondary | ICD-10-CM | POA: Diagnosis not present

## 2017-01-19 DIAGNOSIS — Z1212 Encounter for screening for malignant neoplasm of rectum: Secondary | ICD-10-CM | POA: Diagnosis not present

## 2017-01-19 DIAGNOSIS — Z1211 Encounter for screening for malignant neoplasm of colon: Secondary | ICD-10-CM | POA: Diagnosis not present

## 2017-02-01 DIAGNOSIS — H9313 Tinnitus, bilateral: Secondary | ICD-10-CM | POA: Diagnosis not present

## 2017-02-01 DIAGNOSIS — H903 Sensorineural hearing loss, bilateral: Secondary | ICD-10-CM | POA: Diagnosis not present

## 2017-02-15 DIAGNOSIS — Z6833 Body mass index (BMI) 33.0-33.9, adult: Secondary | ICD-10-CM | POA: Diagnosis not present

## 2017-02-15 DIAGNOSIS — N289 Disorder of kidney and ureter, unspecified: Secondary | ICD-10-CM | POA: Diagnosis not present

## 2017-02-15 DIAGNOSIS — N179 Acute kidney failure, unspecified: Secondary | ICD-10-CM | POA: Diagnosis not present

## 2017-02-15 DIAGNOSIS — I129 Hypertensive chronic kidney disease with stage 1 through stage 4 chronic kidney disease, or unspecified chronic kidney disease: Secondary | ICD-10-CM | POA: Diagnosis not present

## 2017-02-17 ENCOUNTER — Ambulatory Visit (INDEPENDENT_AMBULATORY_CARE_PROVIDER_SITE_OTHER): Payer: Medicare Other | Admitting: Internal Medicine

## 2017-02-17 ENCOUNTER — Encounter: Payer: Self-pay | Admitting: Internal Medicine

## 2017-02-17 VITALS — BP 122/64 | HR 65 | Ht 69.0 in | Wt 231.0 lb

## 2017-02-17 DIAGNOSIS — M7989 Other specified soft tissue disorders: Secondary | ICD-10-CM | POA: Diagnosis not present

## 2017-02-17 DIAGNOSIS — J9611 Chronic respiratory failure with hypoxia: Secondary | ICD-10-CM | POA: Diagnosis not present

## 2017-02-17 DIAGNOSIS — J449 Chronic obstructive pulmonary disease, unspecified: Secondary | ICD-10-CM

## 2017-02-17 NOTE — Progress Notes (Signed)
Subjective:     Patient ID: Derek [name], male   DOB: Oct 01, 1935,    MRN: [id number]    Brief patient profile:  82 yowm quit smoking 2001 "smoker's cough" and wt 210  resolved then started cough again 2008/9 assoc doe > rx  spiriva  helped  But cough continued/ worse in winter then in [place] 03/11/16 admit with pna/copd> back to baseline but referred to pulmonary clinic 04/02/2016 by Dr Wilson Singer ? Copd severity> proved to have GOLD II criteria 04/17/2016     History of Present Illness  04/02/2016 1st [facility] office visit/ Derek [name]   Chief Complaint  Patient presents with  . Pulmonary Consult    Referred by Dr. Wilson Singer. Pt states 2 wks ago while in [place], he was hospitalized x 5 days for PNA and COPD exacerbation.  He states that today her breathing is "excellent". He has o2 at home but has not needed to use this.   has proair but not using while on spiriva dpi   Has 02 but not using  Off pred x one week s relapse - legs swelled on pred, some better off but not resolved  Not limited by breathing from desired activities  But not aerobically active = MMRC1 = can walk nl pace, flat grade, can't hurry or go uphills or steps s sob rec No change   except ok to take furosemide 20 mg one daily if needed for swelling   Plan A = Automatic = Spiriva one daily each am Plan B = Backup Only use your albuterol(proair) as rescue   04/17/2016  f/u ov/Derek [name] re: GOLD II copd by pfts today  Chief Complaint  Patient presents with  . Follow-up    PFT's done today at [facility]. Pt states that his breathing is doing well. He has not had to use albutderol inhaler. No new co's today.   no change doe = MMRC1 = can walk nl pace, flat grade, can't hurry or go uphills or steps s sob   rec Spiriva respimat or Stiolto would be better options than spiriva handihaler    07/27/2016  f/u ov/Derek [name] re: GOLD II  COPD/ ab pattern since hot weather  x1 m better p adding spiriva  Chief Complaint  Patient presents with  . Follow-up     Pt reports breathing is stable. Denies any wheezing/chest tightness.   Was also needing saba at hs but did not call for eval / all symptoms resolved on advair 100/50 one bid including noct and heat intol related  And back to [facility] 1 doe  rec Plan A = Automatic = Advair 250/50 one twice daily/ Spiriva one daily each am(like high octane fuel)  Plan B = Backup Only use your albuterol(proair)  as a rescue medication  - ok to leave off spiriva x one week prior to next ov   10/27/2016  f/u ov/Derek [name] re:  GOLD II copd/ advair 250/ spiriva dpi  Chief Complaint  Patient presents with  . Follow-up    Breathing is doing well. He has only used rescue inhaler x 1 since his last visit.   doe = MMRC1 = can walk nl pace, flat grade, can't hurry or go uphills or steps s sob   rec  Continue to take the lasix (furosemide) 20 mg one daily if needed for swelling    02/17/2017  f/u ov/Derek [name] re:  advair 250 / spiriva dpi  Chief Complaint  Patient presents with  . Follow-up  Pt here for o2 recert. He states he only uses o2 maybe once per wk if he gets SOB after exertion.   one hour per week if over does it sits down and wear 02, does not use it with exertion/ does not monitor his own sats  Leg swelling is better Doe still = mmrc1  No obvious day to day or daytime variability or assoc excess/ purulent sputum or mucus plugs or hemoptysis or cp or chest tightness, subjective wheeze or overt sinus or hb symptoms. No unusual exp hx or h/o childhood pna/ asthma or knowledge of premature birth.  Sleeping ok without nocturnal  or early am exacerbation  of respiratory  c/o's or need for noct saba. Also denies any obvious fluctuation of symptoms with weather or environmental changes or other aggravating or alleviating factors except as outlined above   Current Medications, Allergies, Complete Past Medical History, Past Surgical History, Family History, and Social History were reviewed in [employer] record.  ROS  The following are not active complaints unless bolded sore throat, dysphagia, dental problems, itching, sneezing,  nasal congestion or excess/ purulent secretions, ear ache,   fever, chills, sweats, unintended wt loss, classically pleuritic or exertional cp,  orthopnea pnd or leg swelling, presyncope, palpitations, abdominal pain, anorexia, nausea, vomiting, diarrhea  or change in bowel or bladder habits, change in stools or urine, dysuria,hematuria,  rash, arthralgias, visual complaints, headache, numbness, weakness or ataxia or problems with walking or coordination,  change in mood/affect or memory.                  Objective:   Physical Exam   amb obese wm nad  02/17/2017        231  10/27/2016        234  07/27/2016          230  04/17/2016        232  04/02/16 239 lb 3.4 oz (108.505 kg)  11/06/15 226 lb (102.513 kg)  04/02/15 236 lb (107.049 kg)    Vital signs reviewed  - Note on arrival 02 sats  94% on RA     HEENT: nl dentition, turbinates, and oropharynx. Nl external ear canals without cough reflex   NECK :  without JVD/Nodes/TM/ nl carotid upstrokes bilaterally   LUNGS: no acc muscle use,  Nl contour chest which is clear to A and P bilaterally without cough on insp or exp maneuvers   CV:  RRR  no s3 or murmur or increase in P2,  Trace L > R  pitting  Edema ankles only   ABD: obese  soft and nontender with nl inspiratory excursion in the supine position. No bruits or organomegaly, bowel sounds nl  MS:  Nl gait/ ext warm without deformities, calf tenderness, cyanosis or clubbing No obvious joint restrictions   SKIN: warm and dry without lesions    NEURO:  alert, approp, nl sensorium with  no motor deficits        Assessment:

## 2017-02-17 NOTE — Patient Instructions (Addendum)
Only use the -02 2lpm while walking - goal is to keep your saturations always above 90% but especially while walking   Please see patient coordinator before you leave today  to schedule for POC eval   Keep your previous appt in May - call sooner if needed

## 2017-02-18 NOTE — Assessment & Plan Note (Signed)
Complicated by HBP/ Hyperlipidemia/ DM   Body mass index is 34.11 kg/m.  Trending down slightly/ encouraged Lab Results  Component Value Date   TSH 5.23 02/18/2007     Contributing to gerd risk/ doe/reviewed the need and the process to achieve and maintain neg calorie balance > defer f/u primary care including intermittently monitoring thyroid status   Informed we can't expect him to burn off fat if he doesn't use enough 02 with ex to keep > 90% saturated

## 2017-02-18 NOTE — Assessment & Plan Note (Signed)
rx prn lasix 20 mg 04/02/2016 >>>      Improved/ no change rx

## 2017-02-18 NOTE — Assessment & Plan Note (Signed)
Quit smoking 2001  - PFT's  04/17/2016  FEV1 2.00 (72 % ) ratio 60  p no % improvement from saba p no prior to study with DLCO  38 % corrects to 50 % for alv volume   - 7//2017 worse on spiriva monotherapy > added advair 100 > ov 07/27/2016 changed to advair 250 - 07/27/2016  After extensive coaching HFA effectiveness =    90%   Adequate control on present rx, reviewed in detail with pt > no change in rx needed    I had an extended discussion with the patient reviewing all relevant studies completed to date and  lasting 15 to 20 minutes of a 25 minute visit    Each maintenance medication was reviewed in detail including most importantly the difference between maintenance and prns and under what circumstances the prns are to be triggered using an action plan format that is not reflected in the computer generated alphabetically organized AVS.    Please see AVS for specific instructions unique to this visit that I personally wrote and verbalized to the the pt in detail and then reviewed with pt  by my nurse highlighting any  changes in therapy recommended at today's visit to their plan of care.

## 2017-02-18 NOTE — Assessment & Plan Note (Addendum)
Placed 02 in [place] in May 2017  =  2lpm 24/7 but using prn instead  -   02/17/2017  Patient Saturations on Room Air at Rest = 95% Patient Saturations on Room Air while Ambulating = 88% p one lap = 185 ft mod pace Patient Saturations on 2 Liters of oxygen while Ambulating = 95%  02/17/2017 rec use 02 when walking > room to room at home/ goal is keep >90% sat  Pt is using 02 with exercise and appears to benefit from it when he wears it and should use 2lpm when walking more than 100 ft

## 2017-02-25 DIAGNOSIS — M150 Primary generalized (osteo)arthritis: Secondary | ICD-10-CM | POA: Diagnosis not present

## 2017-02-25 DIAGNOSIS — E790 Hyperuricemia without signs of inflammatory arthritis and tophaceous disease: Secondary | ICD-10-CM | POA: Diagnosis not present

## 2017-02-25 DIAGNOSIS — Z79899 Other long term (current) drug therapy: Secondary | ICD-10-CM | POA: Diagnosis not present

## 2017-02-25 DIAGNOSIS — M1A09X0 Idiopathic chronic gout, multiple sites, without tophus (tophi): Secondary | ICD-10-CM | POA: Diagnosis not present

## 2017-03-02 DIAGNOSIS — N289 Disorder of kidney and ureter, unspecified: Secondary | ICD-10-CM | POA: Diagnosis not present

## 2017-03-03 DIAGNOSIS — H01021 Squamous blepharitis right upper eyelid: Secondary | ICD-10-CM | POA: Diagnosis not present

## 2017-03-03 DIAGNOSIS — H01024 Squamous blepharitis left upper eyelid: Secondary | ICD-10-CM | POA: Diagnosis not present

## 2017-03-03 DIAGNOSIS — Z961 Presence of intraocular lens: Secondary | ICD-10-CM | POA: Diagnosis not present

## 2017-03-24 DIAGNOSIS — I129 Hypertensive chronic kidney disease with stage 1 through stage 4 chronic kidney disease, or unspecified chronic kidney disease: Secondary | ICD-10-CM | POA: Diagnosis not present

## 2017-03-24 DIAGNOSIS — N183 Chronic kidney disease, stage 3 (moderate): Secondary | ICD-10-CM | POA: Diagnosis not present

## 2017-03-24 DIAGNOSIS — N289 Disorder of kidney and ureter, unspecified: Secondary | ICD-10-CM | POA: Diagnosis not present

## 2017-03-24 DIAGNOSIS — Z6833 Body mass index (BMI) 33.0-33.9, adult: Secondary | ICD-10-CM | POA: Diagnosis not present

## 2017-03-24 DIAGNOSIS — N179 Acute kidney failure, unspecified: Secondary | ICD-10-CM | POA: Diagnosis not present

## 2017-03-31 DIAGNOSIS — C44319 Basal cell carcinoma of skin of other parts of face: Secondary | ICD-10-CM | POA: Diagnosis not present

## 2017-04-26 ENCOUNTER — Encounter: Payer: Self-pay | Admitting: Internal Medicine

## 2017-04-26 ENCOUNTER — Ambulatory Visit (INDEPENDENT_AMBULATORY_CARE_PROVIDER_SITE_OTHER): Payer: Medicare Other | Admitting: Internal Medicine

## 2017-04-26 VITALS — BP 108/70 | HR 87 | Ht 69.0 in | Wt 226.0 lb

## 2017-04-26 DIAGNOSIS — J449 Chronic obstructive pulmonary disease, unspecified: Secondary | ICD-10-CM | POA: Diagnosis not present

## 2017-04-26 DIAGNOSIS — J9611 Chronic respiratory failure with hypoxia: Secondary | ICD-10-CM

## 2017-04-26 MED ORDER — BUDESONIDE-FORMOTEROL FUMARATE 160-4.5 MCG/ACT IN AERO: 2.0000 | INHALATION_SPRAY | Freq: Two times a day (BID) | RESPIRATORY_TRACT | 11 refills | Status: DC

## 2017-04-26 MED ORDER — BUDESONIDE-FORMOTEROL FUMARATE 160-4.5 MCG/ACT IN AERO: 2.0000 | INHALATION_SPRAY | Freq: Two times a day (BID) | RESPIRATORY_TRACT | 3 refills | Status: DC

## 2017-04-26 MED ORDER — BUDESONIDE-FORMOTEROL FUMARATE 160-4.5 MCG/ACT IN AERO: 2.0000 | INHALATION_SPRAY | Freq: Two times a day (BID) | RESPIRATORY_TRACT | 0 refills | Status: DC

## 2017-04-26 NOTE — Progress Notes (Signed)
Subjective:     Patient ID: Derek [name], male   DOB: May 31, 1935,    MRN: [id number]    Brief patient profile:  82 yowm quit smoking 2001 "smoker's cough" and wt 210  resolved then started cough again 2008/9 assoc doe > rx  spiriva  helped  But cough continued/ worse in winter then in [place] 03/11/16 admit with pna/copd> back to baseline but referred to pulmonary clinic 04/02/2016 by Dr Wilson Singer ? Copd severity> proved to have GOLD II criteria 04/17/2016     History of Present Illness  04/02/2016 1st [facility] office visit/ Derek [name]   Chief Complaint  Patient presents with  . Pulmonary Consult    Referred by Dr. Wilson Singer. Pt states 2 wks ago while in [place], he was hospitalized x 5 days for PNA and COPD exacerbation.  He states that today her breathing is "excellent". He has o2 at home but has not needed to use this.   has proair but not using while on spiriva dpi   Has 02 but not using  Off pred x one week s relapse - legs swelled on pred, some better off but not resolved  Not limited by breathing from desired activities  But not aerobically active = MMRC1 = can walk nl pace, flat grade, can't hurry or go uphills or steps s sob rec No change   except ok to take furosemide 20 mg one daily if needed for swelling   Plan A = Automatic = Spiriva one daily each am Plan B = Backup Only use your albuterol(proair) as rescue   04/17/2016  f/u ov/Derek [name] re: GOLD II copd by pfts today  Chief Complaint  Patient presents with  . Follow-up    PFT's done today at [facility]. Pt states that his breathing is doing well. He has not had to use albutderol inhaler. No new co's today.   no change doe = MMRC1 = can walk nl pace, flat grade, can't hurry or go uphills or steps s sob   rec Spiriva respimat or Stiolto would be better options than spiriva handihaler    07/27/2016  f/u ov/Derek [name] re: GOLD II  COPD/ ab pattern since hot weather  x1 m better p adding spiriva  Chief Complaint  Patient presents with  . Follow-up     Pt reports breathing is stable. Denies any wheezing/chest tightness.   Was also needing saba at hs but did not call for eval / all symptoms resolved on advair 100/50 one bid including noct and heat intol related  And back to [facility] 1 doe  rec Plan A = Automatic = Advair 250/50 one twice daily/ Spiriva one daily each am(like high octane fuel)  Plan B = Backup Only use your albuterol(proair)  as a rescue medication  - ok to leave off spiriva x one week prior to next ov   10/27/2016  f/u ov/Derek [name] re:  GOLD II copd/ advair 250/ spiriva dpi  Chief Complaint  Patient presents with  . Follow-up    Breathing is doing well. He has only used rescue inhaler x 1 since his last visit.   doe = MMRC1 = can walk nl pace, flat grade, can't hurry or go uphills or steps s sob   rec  Continue to take the lasix (furosemide) 20 mg one daily if needed for swelling    02/17/2017  f/u ov/Derek [name] re:  advair 250 / spiriva dpi  Chief Complaint  Patient presents with  . Follow-up  Pt here for o2 recert. He states he only uses o2 maybe once per wk if he gets SOB after exertion.   one hour per week if over does it sits down and wear 02, does not use it with exertion/ does not monitor his own sats  Leg swelling is better Doe still = mmrc1 rec Only use the -02 2lpm while walking - goal is to keep your saturations always above 90% but especially while walking     04/26/2017  f/u ov/Derek [name] re:  GOLD II copd/ Advair / spiriva  Chief Complaint  Patient presents with  . Follow-up    Breathing is overall doing well. He has occ raspy voice, uses albuterol inhaler about every 2 wks for this and he states it helps.   walks with rolator but limited by L hip pain on 2lpm exertion only but very limited a acitivity overall  No obvious day to day or daytime variability or assoc excess/ purulent sputum or mucus plugs or hemoptysis or cp or chest tightness, subjective wheeze or overt sinus or hb symptoms. No unusual exp hx or h/o  childhood pna/ asthma or knowledge of premature birth.  Sleeping ok without nocturnal  or early am exacerbation  of respiratory  c/o's or need for noct saba. Also denies any obvious fluctuation of symptoms with weather or environmental changes or other aggravating or alleviating factors except as outlined above   Current Medications, Allergies, Complete Past Medical History, Past Surgical History, Family History, and Social History were reviewed in [employer] record.  ROS  The following are not active complaints unless bolded sore throat, dysphagia, dental problems, itching, sneezing,  nasal congestion or excess/ purulent secretions, ear ache,   fever, chills, sweats, unintended wt loss, classically pleuritic or exertional cp,  orthopnea pnd or leg swelling, presyncope, palpitations, abdominal pain, anorexia, nausea, vomiting, diarrhea  or change in bowel or bladder habits, change in stools or urine, dysuria,hematuria,  rash, arthralgias, visual complaints, headache, numbness, weakness or ataxia or problems with walking or coordination,  change in mood/affect or memory.             Objective:   Physical Exam   amb obese wm nad "feels like a fur ball in his throat" but can't clear it despite vigorous throat clearing    04/26/2017          226 02/17/2017        231  10/27/2016        234  07/27/2016          230  04/17/2016        232  04/02/16 239 lb 3.4 oz (108.505 kg)  11/06/15 226 lb (102.513 kg)  04/02/15 236 lb (107.049 kg)    Vital signs reviewed  - Note on arrival 02 sats  94% on RA     HEENT: nl dentition, turbinates, and oropharynx. Nl external ear canals without cough reflex   NECK :  without JVD/Nodes/TM/ nl carotid upstrokes bilaterally   LUNGS: no acc muscle use,  Nl contour chest with decreased bs bilaterally without cough on insp or exp maneuvers   CV:  RRR  no s3 or murmur or increase in P2,  Trace   pitting  Edema both  ankles only   ABD:  quite obese, soft and nontender with nl inspiratory excursion in the supine position. No bruits or organomegaly, bowel sounds nl  MS:  Nl gait/ ext warm without deformities, calf tenderness, cyanosis or  clubbing No obvious joint restrictions   SKIN: warm and dry without lesions    NEURO:  alert, approp, nl sensorium with  no motor deficits        Assessment:

## 2017-04-26 NOTE — Assessment & Plan Note (Signed)
Placed 02 in [place] in May 2017  =  2lpm 24/7 but using prn instead  -   02/17/2017  Patient Saturations on Room Air at Rest = 95% Patient Saturations on Room Air while Ambulating = 88% p one lap = 185 ft mod pace Patient Saturations on 2 Liters of oxygen while Ambulating = 95%  04/26/2017 rec use 02 when walking > room to room at home/ goal is keep >90% sat

## 2017-04-26 NOTE — Assessment & Plan Note (Signed)
Complicated by HBP/ Hyperlipidemia/ DM    Body mass index is 33.37 kg/m.  -  trending down/ encouraged Lab Results  Component Value Date   TSH 5.23 02/18/2007     Contributing to gerd risk/ doe/reviewed the need and the process to achieve and maintain neg calorie balance > defer f/u primary care including intermittently monitoring thyroid status

## 2017-04-26 NOTE — Patient Instructions (Addendum)
Try substituting for advair = symbicort 160 x 2  Take 2 puffs first thing in am and then another 2 puffs about 12 hours later.   Please schedule a follow up visit in 3 months but call sooner if needed  Add consider change spiriva dpi to respimat

## 2017-04-26 NOTE — Assessment & Plan Note (Signed)
Quit smoking 2001  - PFT's  04/17/2016  FEV1 2.00 (72 % ) ratio 60  p no % improvement from saba p no prior to study with DLCO  38 % corrects to 50 % for alv volume   - 7//2017 worse on spiriva monotherapy > added advair 100 > ov 07/27/2016 changed to advair 250  - 04/26/2017  After extensive coaching HFA effectiveness =    90% > change advair to symb 160 2bid due to hoarseness   He has a classic dysphonia pattern seen with dpi's esp advair so try off advair dpi first then on return can consider spriva respimat so that all his devices are basically the same technique  I had an extended discussion with the patient reviewing all relevant studies completed to date and  lasting 15 to 20 minutes of a 25 minute visit on the following ongoing concerns:   Formulary restrictions will be an ongoing challenge for the forseable future and I would be happy to pick an alternative if the pt will first  provide me a list of them but pt  will need to return here for training for any new device that is required eg dpi vs hfa vs respimat.    In meantime we can always provide samples so the patient never runs out of any needed respiratory medications.   Each maintenance medication was reviewed in detail including most importantly the difference between maintenance and as needed and under what circumstances the prns are to be used.  Please see AVS for specific  Instructions which are unique to this visit and I personally typed out  which were reviewed in detail in writing with the patient and a copy provided.

## 2017-07-27 ENCOUNTER — Encounter: Payer: Self-pay | Admitting: Internal Medicine

## 2017-07-27 ENCOUNTER — Ambulatory Visit (INDEPENDENT_AMBULATORY_CARE_PROVIDER_SITE_OTHER): Payer: Medicare Other | Admitting: Internal Medicine

## 2017-07-27 VITALS — BP 126/74 | HR 55 | Ht 69.0 in | Wt 221.0 lb

## 2017-07-27 DIAGNOSIS — J449 Chronic obstructive pulmonary disease, unspecified: Secondary | ICD-10-CM

## 2017-07-27 MED ORDER — BUDESONIDE-FORMOTEROL FUMARATE 160-4.5 MCG/ACT IN AERO: 2.0000 | INHALATION_SPRAY | Freq: Two times a day (BID) | RESPIRATORY_TRACT | 0 refills | Status: DC

## 2017-07-27 MED ORDER — BUDESONIDE-FORMOTEROL FUMARATE 160-4.5 MCG/ACT IN AERO: 2.0000 | INHALATION_SPRAY | Freq: Two times a day (BID) | RESPIRATORY_TRACT | 3 refills | Status: DC

## 2017-07-27 NOTE — Progress Notes (Signed)
Subjective:     Patient ID: Derek [name], male   DOB: 12-16-1935,    MRN: [id number]    Brief patient profile:  82 yowm quit smoking 2001 "smoker's cough" and wt 210  resolved then started cough again 2008/9 assoc doe > rx  spiriva  helped  But cough continued/ worse in winter then in [place] 03/11/16 admit with pna/copd> back to baseline but referred to pulmonary clinic 04/02/2016 by Dr Wilson Singer ? Copd severity> proved to have GOLD II criteria 04/17/2016     History of Present Illness  04/02/2016 1st [facility] office visit/ Derek [name]   Chief Complaint  Patient presents with  . Pulmonary Consult    Referred by Dr. Wilson Singer. Pt states 2 wks ago while in [place], he was hospitalized x 5 days for PNA and COPD exacerbation.  He states that today her breathing is "excellent". He has o2 at home but has not needed to use this.   has proair but not using while on spiriva dpi   Has 02 but not using  Off pred x one week s relapse - legs swelled on pred, some better off but not resolved  Not limited by breathing from desired activities  But not aerobically active = MMRC1 = can walk nl pace, flat grade, can't hurry or go uphills or steps s sob rec No change   except ok to take furosemide 20 mg one daily if needed for swelling   Plan A = Automatic = Spiriva one daily each am Plan B = Backup Only use your albuterol(proair) as rescue   04/17/2016  f/u ov/Derek [name] re: GOLD II copd by pfts today  Chief Complaint  Patient presents with  . Follow-up    PFT's done today at [facility]. Pt states that his breathing is doing well. He has not had to use albutderol inhaler. No new co's today.   no change doe = MMRC1 = can walk nl pace, flat grade, can't hurry or go uphills or steps s sob   rec Spiriva respimat or Stiolto would be better options than spiriva handihaler    07/27/2016  f/u ov/Derek [name] re: GOLD II  COPD/ ab pattern since hot weather  x1 m better p adding spiriva  Chief Complaint  Patient presents with  . Follow-up     Pt reports breathing is stable. Denies any wheezing/chest tightness.   Was also needing saba at hs but did not call for eval / all symptoms resolved on advair 100/50 one bid including noct and heat intol related  And back to [facility] 1 doe  rec Plan A = Automatic = Advair 250/50 one twice daily/ Spiriva one daily each am(like high octane fuel)  Plan B = Backup Only use your albuterol(proair)  as a rescue medication  - ok to leave off spiriva x one week prior to next ov   10/27/2016  f/u ov/Derek [name] re:  GOLD II copd/ advair 250/ spiriva dpi  Chief Complaint  Patient presents with  . Follow-up    Breathing is doing well. He has only used rescue inhaler x 1 since his last visit.   doe = MMRC1 = can walk nl pace, flat grade, can't hurry or go uphills or steps s sob   rec  Continue to take the lasix (furosemide) 20 mg one daily if needed for swelling    02/17/2017  f/u ov/Derek [name] re:  advair 250 / spiriva dpi  Chief Complaint  Patient presents with  . Follow-up  Pt here for o2 recert. He states he only uses o2 maybe once per wk if he gets SOB after exertion.   one hour per week if over does it sits down and wear 02, does not use it with exertion/ does not monitor his own sats  Leg swelling is better Doe still = mmrc1 rec Only use the -02 2lpm while walking - goal is to keep your saturations always above 90% but especially while walking     04/26/2017  f/u ov/Derek [name] re:  GOLD II copd/ Advair / spiriva  Chief Complaint  Patient presents with  . Follow-up    Breathing is overall doing well. He has occ raspy voice, uses albuterol inhaler about every 2 wks for this and he states it helps.   walks with rolator but limited by L hip pain on 2lpm exertion only but very limited a acitivity overall rec Try substituting for advair = symbicort 160 x 2  Take 2 puffs first thing in am and then another 2 puffs about 12 hours later.  Please schedule a follow up visit in 3 months but call sooner if needed   Add consider change spiriva dpi to respimat     07/27/2017  f/u ov/Derek [name] re: GOLD II copd / advair and spiriva dpi Chief Complaint  Patient presents with  . Follow-up    Breathing is doing well. He uses his albuterol inhaler 2-3 x per wk on average.    some mouth/ throat irritation from advair and has not yet started symbicort as he had lots of advair left over from 90 day supply  No change doe = MMRC1 = can walk nl pace, flat grade, can't hurry or go uphills or steps s sob    No obvious day to day or daytime variability or assoc excess/ purulent sputum or mucus plugs or hemoptysis or cp or chest tightness, subjective wheeze or overt sinus or hb symptoms. No unusual exp hx or h/o childhood pna/ asthma or knowledge of premature birth.  Sleeping ok without nocturnal  or early am exacerbation  of respiratory  c/o's or need for noct saba. Also denies any obvious fluctuation of symptoms with weather or environmental changes or other aggravating or alleviating factors except as outlined above   Current Medications, Allergies, Complete Past Medical History, Past Surgical History, Family History, and Social History were reviewed in [employer] record.  ROS  The following are not active complaints unless bolded sore throat, dysphagia, dental problems, itching, sneezing,  nasal congestion or excess/ purulent secretions, ear ache,   fever, chills, sweats, unintended wt loss, classically pleuritic or exertional cp,  orthopnea pnd or leg swelling, presyncope, palpitations, abdominal pain, anorexia, nausea, vomiting, diarrhea  or change in bowel or bladder habits, change in stools or urine, dysuria,hematuria,  rash, arthralgias, visual complaints, headache, numbness, weakness or ataxia or problems with walking uses rolling walker or coordination,  change in mood/affect or memory.                 Objective:   Physical Exam   amb obese wm nad    07/27/2017          221   04/26/2017          226 02/17/2017        231  10/27/2016        234  07/27/2016          230  04/17/2016        232  04/02/16 239 lb 3.4 oz (108.505 kg)  11/06/15 226 lb (102.513 kg)  04/02/15 236 lb (107.049 kg)    Vital signs reviewed  - Note on arrival 02 sats  96% on RA     HEENT: nl dentition, turbinates, and oropharynx. Nl external ear canals without cough reflex   NECK :  without JVD/Nodes/TM/ nl carotid upstrokes bilaterally   LUNGS: no acc muscle use,  Nl contour chest with decreased bs bilaterally without cough on insp or exp maneuvers   CV:  RRR  no s3 or murmur or increase in P2,   No significant  pitting  Edema either ankle  ABD: quite obese, soft and nontender with nl inspiratory excursion in the supine position. No bruits or organomegaly, bowel sounds nl  MS:  Nl gait/ ext warm without deformities, calf tenderness, cyanosis or clubbing No obvious joint restrictions   SKIN: warm and dry without lesions    NEURO:  alert, approp, nl sensorium with  no motor deficits        Assessment:

## 2017-07-27 NOTE — Patient Instructions (Addendum)
Plan A = Automatic =  Stop advair and start symbicort 160 Take 2 puffs first thing in am and then another 2 puffs about 12 hours later.    Work on inhaler technique:  relax and gently blow all the way out then take a nice smooth deep breath back in, triggering the inhaler at same time you start breathing in.  Hold for up to 5 seconds if you can. Blow out thru nose. Rinse and gargle with water when done   Plan B = Backup Only use your albuterol as a rescue medication to be used if you can't catch your breath by resting or doing a relaxed purse lip breathing pattern.  - The less you use it, the better it will work when you need it. - Ok to use the inhaler up to 2 puffs  every 4 hours if you must but call for appointment if use goes up over your usual need - Don't leave home without it !!  (think of it like the spare tire for your car)     Please schedule a follow up visit in 3 months but call sooner if needed  with all medications /inhalers/ solutions in hand so we can verify exactly what you are taking. This includes all medications from all doctors and over the counters Add: Check to see how he's using 02

## 2017-07-29 ENCOUNTER — Telehealth: Payer: Self-pay | Admitting: *Deleted

## 2017-07-29 NOTE — Telephone Encounter (Signed)
LMTCB

## 2017-07-29 NOTE — Telephone Encounter (Signed)
-----   Message from Tanda Rockers, MD sent at 07/29/2017  6:34 AM EDT -----  Check to see how he's using 02

## 2017-07-29 NOTE — Assessment & Plan Note (Signed)
Quit smoking 2001  - PFT's  04/17/2016  FEV1 2.00 (72 % ) ratio 60  p no % improvement from saba p no prior to study with DLCO  38 % corrects to 50 % for alv volume   - 7//2017 worse on spiriva monotherapy > added advair 100 > ov 07/27/2016 changed to advair 250  - 07/27/2017  After extensive coaching HFA effectiveness =    90% > change advair to symb 160 2bid due to hoarseness   Next ov will change dpi spiriva to the respimat so all inhalers ar similar but should return with all meds in hand using a trust but verify approach to confirm accurate Medication  Reconciliation The principal here is that until we are certain that the  patients are doing what we've asked, it makes no sense to ask them to do more.   Each maintenance medication was reviewed in detail including most importantly the difference between maintenance and as needed and under what circumstances the prns are to be used.  Please see AVS for specific  Instructions which are unique to this visit and I personally typed out  which were reviewed in detail in writing with the patient and a copy provided.

## 2017-07-30 NOTE — Telephone Encounter (Signed)
Spoke with pt  He states he is using o2 only with exercise at 2lpm  Problem list [facility] note updates Will forward to MW to make him aware

## 2017-07-30 NOTE — Telephone Encounter (Signed)
Noted  

## 2017-08-02 ENCOUNTER — Telehealth: Payer: Self-pay | Admitting: Internal Medicine

## 2017-08-02 MED ORDER — BUDESONIDE-FORMOTEROL FUMARATE 160-4.5 MCG/ACT IN AERO: 2.0000 | INHALATION_SPRAY | Freq: Two times a day (BID) | RESPIRATORY_TRACT | 3 refills | Status: DC

## 2017-08-02 NOTE — Telephone Encounter (Signed)
Spoke with pt. He needs his Symbicort prescription sent to [facility]. Rx has been sent in. Nothing further was needed.

## 2017-09-11 ENCOUNTER — Inpatient Hospital Stay (HOSPITAL_COMMUNITY): Payer: Medicare Other

## 2017-09-11 ENCOUNTER — Emergency Department (HOSPITAL_COMMUNITY): Payer: Medicare Other

## 2017-09-11 ENCOUNTER — Inpatient Hospital Stay (HOSPITAL_COMMUNITY)
Admission: EM | Admit: 2017-09-11 | Discharge: 2017-09-14 | DRG: 062 | Disposition: A | Discharge status: Home / Self Care | Payer: Medicare Other | Attending: Neurology | Admitting: Neurology

## 2017-09-11 ENCOUNTER — Encounter (HOSPITAL_COMMUNITY): Payer: Self-pay | Admitting: Emergency Medicine

## 2017-09-11 DIAGNOSIS — R001 Bradycardia, unspecified: Secondary | ICD-10-CM | POA: Diagnosis present

## 2017-09-11 DIAGNOSIS — Z8249 Family history of ischemic heart disease and other diseases of the circulatory system: Secondary | ICD-10-CM

## 2017-09-11 DIAGNOSIS — I4819 Other persistent atrial fibrillation: Secondary | ICD-10-CM

## 2017-09-11 DIAGNOSIS — I6622 Occlusion and stenosis of left posterior cerebral artery: Secondary | ICD-10-CM | POA: Diagnosis present

## 2017-09-11 DIAGNOSIS — Z833 Family history of diabetes mellitus: Secondary | ICD-10-CM

## 2017-09-11 DIAGNOSIS — I5033 Acute on chronic diastolic (congestive) heart failure: Secondary | ICD-10-CM | POA: Diagnosis not present

## 2017-09-11 DIAGNOSIS — G4733 Obstructive sleep apnea (adult) (pediatric): Secondary | ICD-10-CM | POA: Diagnosis present

## 2017-09-11 DIAGNOSIS — R29706 NIHSS score 6: Secondary | ICD-10-CM | POA: Diagnosis not present

## 2017-09-11 DIAGNOSIS — Z951 Presence of aortocoronary bypass graft: Secondary | ICD-10-CM

## 2017-09-11 DIAGNOSIS — E039 Hypothyroidism, unspecified: Secondary | ICD-10-CM | POA: Diagnosis present

## 2017-09-11 DIAGNOSIS — I651 Occlusion and stenosis of basilar artery: Secondary | ICD-10-CM | POA: Diagnosis present

## 2017-09-11 DIAGNOSIS — Z9842 Cataract extraction status, left eye: Secondary | ICD-10-CM

## 2017-09-11 DIAGNOSIS — I253 Aneurysm of heart: Secondary | ICD-10-CM | POA: Diagnosis present

## 2017-09-11 DIAGNOSIS — Z9981 Dependence on supplemental oxygen: Secondary | ICD-10-CM | POA: Diagnosis not present

## 2017-09-11 DIAGNOSIS — R4701 Aphasia: Secondary | ICD-10-CM | POA: Diagnosis present

## 2017-09-11 DIAGNOSIS — I481 Persistent atrial fibrillation: Secondary | ICD-10-CM | POA: Diagnosis not present

## 2017-09-11 DIAGNOSIS — E785 Hyperlipidemia, unspecified: Secondary | ICD-10-CM | POA: Diagnosis not present

## 2017-09-11 DIAGNOSIS — I6312 Cerebral infarction due to embolism of basilar artery: Secondary | ICD-10-CM | POA: Diagnosis not present

## 2017-09-11 DIAGNOSIS — Z79899 Other long term (current) drug therapy: Secondary | ICD-10-CM | POA: Diagnosis not present

## 2017-09-11 DIAGNOSIS — I10 Essential (primary) hypertension: Secondary | ICD-10-CM

## 2017-09-11 DIAGNOSIS — G450 Vertebro-basilar artery syndrome: Secondary | ICD-10-CM

## 2017-09-11 DIAGNOSIS — I480 Paroxysmal atrial fibrillation: Secondary | ICD-10-CM | POA: Diagnosis not present

## 2017-09-11 DIAGNOSIS — J90 Pleural effusion, not elsewhere classified: Secondary | ICD-10-CM | POA: Diagnosis not present

## 2017-09-11 DIAGNOSIS — I252 Old myocardial infarction: Secondary | ICD-10-CM | POA: Diagnosis not present

## 2017-09-11 DIAGNOSIS — R471 Dysarthria and anarthria: Secondary | ICD-10-CM | POA: Diagnosis present

## 2017-09-11 DIAGNOSIS — N184 Chronic kidney disease, stage 4 (severe): Secondary | ICD-10-CM | POA: Diagnosis present

## 2017-09-11 DIAGNOSIS — I2510 Atherosclerotic heart disease of native coronary artery without angina pectoris: Secondary | ICD-10-CM | POA: Diagnosis present

## 2017-09-11 DIAGNOSIS — R29703 NIHSS score 3: Secondary | ICD-10-CM | POA: Diagnosis present

## 2017-09-11 DIAGNOSIS — G459 Transient cerebral ischemic attack, unspecified: Principal | ICD-10-CM | POA: Diagnosis present

## 2017-09-11 DIAGNOSIS — Z23 Encounter for immunization: Secondary | ICD-10-CM

## 2017-09-11 DIAGNOSIS — I6529 Occlusion and stenosis of unspecified carotid artery: Secondary | ICD-10-CM

## 2017-09-11 DIAGNOSIS — Z8349 Family history of other endocrine, nutritional and metabolic diseases: Secondary | ICD-10-CM | POA: Diagnosis not present

## 2017-09-11 DIAGNOSIS — Z9049 Acquired absence of other specified parts of digestive tract: Secondary | ICD-10-CM

## 2017-09-11 DIAGNOSIS — Z87891 Personal history of nicotine dependence: Secondary | ICD-10-CM

## 2017-09-11 DIAGNOSIS — E669 Obesity, unspecified: Secondary | ICD-10-CM | POA: Diagnosis present

## 2017-09-11 DIAGNOSIS — J449 Chronic obstructive pulmonary disease, unspecified: Secondary | ICD-10-CM | POA: Diagnosis present

## 2017-09-11 DIAGNOSIS — Z6832 Body mass index (BMI) 32.0-32.9, adult: Secondary | ICD-10-CM

## 2017-09-11 DIAGNOSIS — I671 Cerebral aneurysm, nonruptured: Secondary | ICD-10-CM | POA: Diagnosis not present

## 2017-09-11 DIAGNOSIS — G8191 Hemiplegia, unspecified affecting right dominant side: Secondary | ICD-10-CM | POA: Diagnosis present

## 2017-09-11 DIAGNOSIS — Z7982 Long term (current) use of aspirin: Secondary | ICD-10-CM | POA: Diagnosis not present

## 2017-09-11 DIAGNOSIS — I1310 Hypertensive heart and chronic kidney disease without heart failure, with stage 1 through stage 4 chronic kidney disease, or unspecified chronic kidney disease: Secondary | ICD-10-CM | POA: Diagnosis present

## 2017-09-11 DIAGNOSIS — I4891 Unspecified atrial fibrillation: Secondary | ICD-10-CM

## 2017-09-11 DIAGNOSIS — I6521 Occlusion and stenosis of right carotid artery: Secondary | ICD-10-CM | POA: Diagnosis not present

## 2017-09-11 DIAGNOSIS — I6322 Cerebral infarction due to unspecified occlusion or stenosis of basilar arteries: Secondary | ICD-10-CM | POA: Diagnosis not present

## 2017-09-11 DIAGNOSIS — R531 Weakness: Secondary | ICD-10-CM | POA: Diagnosis not present

## 2017-09-11 DIAGNOSIS — I6523 Occlusion and stenosis of bilateral carotid arteries: Secondary | ICD-10-CM | POA: Diagnosis not present

## 2017-09-11 DIAGNOSIS — I6302 Cerebral infarction due to thrombosis of basilar artery: Secondary | ICD-10-CM | POA: Diagnosis present

## 2017-09-11 DIAGNOSIS — I4430 Unspecified atrioventricular block: Secondary | ICD-10-CM

## 2017-09-11 DIAGNOSIS — I6501 Occlusion and stenosis of right vertebral artery: Secondary | ICD-10-CM | POA: Diagnosis not present

## 2017-09-11 DIAGNOSIS — Z9841 Cataract extraction status, right eye: Secondary | ICD-10-CM

## 2017-09-11 DIAGNOSIS — I25810 Atherosclerosis of coronary artery bypass graft(s) without angina pectoris: Secondary | ICD-10-CM | POA: Diagnosis not present

## 2017-09-11 DIAGNOSIS — I639 Cerebral infarction, unspecified: Secondary | ICD-10-CM | POA: Diagnosis not present

## 2017-09-11 DIAGNOSIS — R404 Transient alteration of awareness: Secondary | ICD-10-CM | POA: Diagnosis not present

## 2017-09-11 LAB — CBC
HEMATOCRIT: 38.8 % — AB (ref 39.0–52.0)
HEMOGLOBIN: 12.8 g/dL — AB (ref 13.0–17.0)
MCH: 31.1 pg (ref 26.0–34.0)
MCHC: 33 g/dL (ref 30.0–36.0)
MCV: 94.4 fL (ref 78.0–100.0)
Platelets: 112 10*3/uL — ABNORMAL LOW (ref 150–400)
RBC: 4.11 MIL/uL — AB (ref 4.22–5.81)
RDW: 14.9 % (ref 11.5–15.5)
WBC: 5.1 10*3/uL (ref 4.0–10.5)

## 2017-09-11 LAB — COMPREHENSIVE METABOLIC PANEL
ALBUMIN: 3.7 g/dL (ref 3.5–5.0)
ALT: 18 U/L (ref 17–63)
AST: 29 U/L (ref 15–41)
Alkaline Phosphatase: 28 U/L — ABNORMAL LOW (ref 38–126)
Anion gap: 7 (ref 5–15)
BILIRUBIN TOTAL: 0.7 mg/dL (ref 0.3–1.2)
BUN: 33 mg/dL — AB (ref 6–20)
CHLORIDE: 107 mmol/L (ref 101–111)
CO2: 24 mmol/L (ref 22–32)
CREATININE: 2.16 mg/dL — AB (ref 0.61–1.24)
Calcium: 9.1 mg/dL (ref 8.9–10.3)
GFR calc Af Amer: 31 mL/min — ABNORMAL LOW (ref >60)
GFR, EST NON AFRICAN AMERICAN: 27 mL/min — AB (ref >60)
GLUCOSE: 93 mg/dL (ref 65–99)
Potassium: 4.1 mmol/L (ref 3.5–5.1)
Sodium: 138 mmol/L (ref 135–145)
Total Protein: 6 g/dL — ABNORMAL LOW (ref 6.5–8.1)

## 2017-09-11 LAB — I-STAT CHEM 8, ED
BUN: 34 mg/dL — AB (ref 6–20)
CALCIUM ION: 1.14 mmol/L — AB (ref 1.15–1.40)
CHLORIDE: 104 mmol/L (ref 101–111)
CREATININE: 2.2 mg/dL — AB (ref 0.61–1.24)
GLUCOSE: 91 mg/dL (ref 65–99)
HEMATOCRIT: 37 % — AB (ref 39.0–52.0)
Hemoglobin: 12.6 g/dL — ABNORMAL LOW (ref 13.0–17.0)
POTASSIUM: 4.1 mmol/L (ref 3.5–5.1)
Sodium: 140 mmol/L (ref 135–145)
TCO2: 24 mmol/L (ref 22–32)

## 2017-09-11 LAB — PROTIME-INR
INR: 1.04
Prothrombin Time: 13.5 seconds (ref 11.4–15.2)

## 2017-09-11 LAB — I-STAT TROPONIN, ED: TROPONIN I, POC: 0.02 ng/mL (ref 0.00–0.08)

## 2017-09-11 LAB — CBG MONITORING, ED: Glucose-Capillary: 90 mg/dL (ref 65–99)

## 2017-09-11 LAB — DIFFERENTIAL
BASOS PCT: 3 %
Basophils Absolute: 0.2 10*3/uL — ABNORMAL HIGH (ref 0.0–0.1)
EOS ABS: 0.3 10*3/uL (ref 0.0–0.7)
EOS PCT: 5 %
LYMPHS PCT: 34 %
Lymphs Abs: 1.7 10*3/uL (ref 0.7–4.0)
Monocytes Absolute: 0.4 10*3/uL (ref 0.1–1.0)
Monocytes Relative: 8 %
Neutro Abs: 2.5 10*3/uL (ref 1.7–7.7)
Neutrophils Relative %: 50 %

## 2017-09-11 LAB — APTT: APTT: 30 s (ref 24–36)

## 2017-09-11 LAB — I-STAT CG4 LACTIC ACID, ED: Lactic Acid, Venous: 0.95 mmol/L (ref 0.5–1.9)

## 2017-09-11 MED ORDER — ALTEPLASE (STROKE) FULL DOSE INFUSION
90.0000 mg | Freq: Once | INTRAVENOUS | Status: AC
  Administered 2017-09-11: 90 mg via INTRAVENOUS

## 2017-09-11 MED ORDER — ACETAMINOPHEN 650 MG RE SUPP: 650.0000 mg | RECTAL | Status: DC | PRN

## 2017-09-11 MED ORDER — SODIUM CHLORIDE 0.9 % IV SOLN
INTRAVENOUS | Status: DC
  Administered 2017-09-11 – 2017-09-12 (×2): via INTRAVENOUS

## 2017-09-11 MED ORDER — ACETAMINOPHEN 325 MG PO TABS
650.0000 mg | ORAL_TABLET | ORAL | Status: DC | PRN
  Administered 2017-09-11: 650 mg via ORAL
  Filled 2017-09-11 (×2): qty 2

## 2017-09-11 MED ORDER — SODIUM CHLORIDE 0.9 % IV SOLN: 50.0000 mL | Freq: Once | INTRAVENOUS | Status: DC

## 2017-09-11 MED ORDER — STROKE: EARLY STAGES OF RECOVERY BOOK
Freq: Once | Status: AC
  Administered 2017-09-12
  Filled 2017-09-11 (×2): qty 1

## 2017-09-11 MED ORDER — SENNOSIDES-DOCUSATE SODIUM 8.6-50 MG PO TABS
1.0000 | ORAL_TABLET | Freq: Every evening | ORAL | Status: DC | PRN
  Filled 2017-09-11: qty 1

## 2017-09-11 MED ORDER — NICARDIPINE HCL IN NACL 20-0.86 MG/200ML-% IV SOLN
0.0000 mg/h | INTRAVENOUS | Status: DC
  Administered 2017-09-11: 5 mg/h via INTRAVENOUS
  Filled 2017-09-11: qty 200

## 2017-09-11 MED ORDER — ACETAMINOPHEN 160 MG/5ML PO SOLN: 650.0000 mg | ORAL | Status: DC | PRN

## 2017-09-11 MED ORDER — IOPAMIDOL (ISOVUE-370) INJECTION 76%
INTRAVENOUS | Status: AC
  Administered 2017-09-11: 19:00:00
  Filled 2017-09-11: qty 100

## 2017-09-11 NOTE — ED Notes (Signed)
Consulting MD at bedside (cardiology)

## 2017-09-11 NOTE — Progress Notes (Addendum)
ABG was DC'd due to pt. Receiving TPA today. RN stated she would notify RT if MD wanted to order another abg for morning.

## 2017-09-11 NOTE — H&P (Addendum)
Stroke history and physical/Consultation  Reason for Consult: Acute code stroke Referring Physician: Dr. Ellender Hose  CC: Altered mental status, right-sided weakness  History is obtained from: Chart, patient's girlfriend, and later on from the patient  HPI: Derek [name] is a 82 y.o. male was a past medical history of hypertension, coronary artery disease status post CABG, COPD on home oxygen, hypothyroidism, who was in his usual state of health until about 5:30 PM when he had sudden onset of unresponsiveness witnessed by his golfing. They were cooking at home and he suddenly became unresponsive. She called 911 immediately. The patient was evaluated by EMS and found to have right-sided weakness and was not answering questions appropriately. Activated large vessel occlusion code stroke and brought the patient to the ER. In the emergency room, at the bridge on first examination, the patient was lethargic, dysarthric, poor attention concentration and nonfocal in terms of weakness. He was taken in for a stat noncontrast CT of the head, that revealed a heavily calcified right vertebral artery and basilar artery on the noncontrast CT of the head, and also had a suspicious looking finding which was concerning for thrombus on the distal basilar. This was confirmed with the neuroradiologist on call who also agreed with the finding of possible thrombus in the setting of a heavily calcified basilar. Repeat exam of the patient showed disconjugate gaze, inability to cross midline to the right and worsening dysarthria. He was also at that time weak on the right upper extremity. At that time, decision was made to use IV TPA. A CT angiogram of the head was done, which showed a heavily calcified right vertebral artery, stenotic basilar with probable distal occlusion, and left P2 occlusion. Left vertebral artery was nondominant and ended in a PICA. As these imaging studies were done, we also ordered a stat MRI brain to  evaluate for any evidence of infarct. Neuro interventionist, Dr. Estanislado Pandy, was also contacted and reviewed the images. MRI of the brain did not reveal any evidence of acute ischemia in the brainstem. The patient's exam also started to improve, with him becoming more awake, following all commands, speech becoming less dysarthric. He still did have mild right facial droop and mild right upper extremity weakness. His gaze had no restriction and extraocular movements became intact. At that time, decision was made not to take him for an acute intervention. He will be admitted to the ICU at this time. If his neuro status changes, neurology will reevaluate and the neuro intervention list will be recontacted for emergent procedure.  Patient self and reported that he had been having cough and thick phlegm for the prior 2 weeks. He did not have any fever of chills. He was complaining of some shortness of breath. He had no chest discomfort. No palpitations.  EMS also noted that his EKG showed atrial fibrillation with slow ventricular rate.   Patients HR was in 30s to 40s for the longest time in the CT and his imaging (CTA and MRI was delayed because of this)  LKW: 5:15 PM tpa given?: Yes Premorbid modified Rankin scale (mRS): 1  Unable to obtain due to altered mental status.   Past Medical History:  Diagnosis Date  . AAA (abdominal aortic aneurysm) ([place])   . Arthritis   . COPD (chronic obstructive pulmonary disease) ([place])   . Coronary artery disease   . Hypertension   . Hypothyroidism   . Myocardial infarction ([place])   . Shortness of breath    on exertion;  has productive cough at times  . Sleep apnea    states mild . no cpap     Family History  Problem Relation Age of Onset  . Diabetes Mother   . Heart disease Mother        Before age 63  . Diabetes Sister   . Hypertension Sister   . Diabetes Brother   . Hypertension Brother   . Hyperlipidemia Brother   . Heart attack Daughter   .  Hypertension Son     Social History:   reports that he quit smoking about 17 years ago. His smoking use included Cigarettes. He has a 38.25 pack-year smoking history. He has never used smokeless tobacco. He reports that he drinks alcohol. He reports that he does not use drugs.  Medications  Current Facility-Administered Medications:  .  [COMPLETED] alteplase (ACTIVASE) 1 mg/mL infusion 90 mg, 90 mg, Intravenous, Once, Last Rate: 90 mL/hr at 09/11/17 1845, 90 mg at 09/11/17 1845 **FOLLOWED BY** 0.9 %  sodium chloride infusion, 50 mL, Intravenous, Once, Amie Portland, MD .  nicardipine (CARDENE) 20mg  in 0.86% saline 232ml IV infusion (0.1 mg/ml), 0-15 mg/hr, Intravenous, Continuous, Amie Portland, MD, Last Rate: 50 mL/hr at 09/11/17 1946, 5 mg/hr at 09/11/17 1946  Current Outpatient Prescriptions:  .  albuterol (PROAIR HFA) 108 (90 Base) MCG/ACT inhaler, Inhale 2 puffs into the lungs every 6 (six) hours as needed for wheezing or shortness of breath., Disp: 3 Inhaler, Rfl: 2 .  aspirin 81 MG tablet, Take 81 mg by mouth daily., Disp: , Rfl:  .  Biotin (BIOTIN 5000) 5 MG CAPS, Take by mouth daily., Disp: , Rfl:  .  budesonide-formoterol (SYMBICORT) 160-4.5 MCG/ACT inhaler, Inhale 2 puffs into the lungs 2 (two) times daily., Disp: 3 Inhaler, Rfl: 3 .  Choline Fenofibrate (TRILIPIX) 135 MG capsule, Take 135 mg by mouth daily., Disp: , Rfl:  .  ezetimibe (ZETIA) 10 MG tablet, Take 10 mg by mouth daily., Disp: , Rfl:  .  famotidine (PEPCID) 20 MG tablet, Take 20 mg by mouth as needed for heartburn or indigestion., Disp: , Rfl:  .  febuxostat (ULORIC) 40 MG tablet, Take 80 mg by mouth daily., Disp: , Rfl:  .  fish oil-omega-3 fatty acids 1000 MG capsule, Take 2 g by mouth 2 (two) times daily., Disp: , Rfl:  .  furosemide (LASIX) 20 MG tablet, One daily as needed for swelling, Disp: 90 tablet, Rfl: 2 .  Glucosamine-Chondroit-Vit C-Mn (GLUCOSAMINE 1500 COMPLEX) CAPS, Take 1 capsule by mouth., Disp: , Rfl:   .  levothyroxine (SYNTHROID, LEVOTHROID) 75 MCG tablet, Take 88 mcg by mouth daily. , Disp: , Rfl:  .  MELATONIN PO, Take 1 tablet by mouth at bedtime as needed., Disp: , Rfl:  .  Multiple Vitamin (MULTIVITAMIN) tablet, Take 1 tablet by mouth daily., Disp: , Rfl:  .  Omega-3-acid Ethyl Esters (OMACOR PO), Take 1 g by mouth 4 (four) times daily., Disp: , Rfl:  .  OXYGEN, 2lpm with exertion only, Disp: , Rfl:  .  pregabalin (LYRICA) 50 MG capsule, Take 50 mg by mouth 2 (two) times daily., Disp: , Rfl:  .  rosuvastatin (CRESTOR) 20 MG tablet, Take 20 mg by mouth daily., Disp: , Rfl:  .  terazosin (HYTRIN) 10 MG capsule, Take 10 mg by mouth 2 (two) times daily. , Disp: , Rfl:  .  tiotropium (SPIRIVA) 18 MCG inhalation capsule, Take 18 mcg by mouth daily., Disp: , Rfl:  .  vitamin  B-12 (CYANOCOBALAMIN) 1000 MCG tablet, Take 1,000 mcg by mouth daily., Disp: , Rfl:   Exam: Current vital signs: BP (!) 168/79   Pulse (!) 46   Resp (!) 22   SpO2 96%  Vital signs in last 24 hours: Pulse Rate:  [43-46] 46 (09/22 1845) Resp:  [21-24] 22 (09/22 1845) BP: (168-187)/(79-91) 168/79 (09/22 1845) SpO2:  [93 %-96 %] 96 % (09/22 1845)  GENERAL: drowsy, in NAD HEENT: - Normocephalic and atraumatic, dry mm, no LN++, no Thyromegally LUNGS - Clear to auscultation bilaterally with no wheezes CV - S1S2 RRR, no m/r/g, equal pulses bilaterally. ABDOMEN - Soft, nontender, nondistended with normoactive BS Ext: warm, well perfused, intact peripheral pulses, 1+ edema  NEURO:  Mental Status: Drowsy, easily arousable, orientedx3 Language: speech is moderately dysarthric  Naming, repetition, fluency, and comprehension intact. Cranial Nerves: PERRL 28mm/brisk. EOMI initially, but then had restricted eye movements and could not cross midline to the right and gaze was slightly disconjugate as well, visual fields full, no facial asymmetry on initial exam, but later had mild nasolabial fold flattening on the right, facial  sensation intact, hearing intact, tongue/uvula/soft palate midline, normal sternocleidomastoid and trapezius muscle strength. No evidence of tongue atrophy or fibrillations Motor: 5/5 bilateral upper extremities to begin with, repeat exams 4+/5 in the right upper extremity. 5/5 left upper extremity. He has 4/5 in both lower extremities at all times. Tone: is normal and bulk is normal Sensation- Intact to light touch bilaterally Coordination: FTN intact bilaterally Gait- deferred  NIHSS 1a Level of Conscious.: 1 1b LOC Questions: 0 1c LOC Commands: 0 2 Best Gaze: 0 3 Visual: 0 4 Facial Palsy: 0 5a Motor Arm - left: 0 5b Motor Arm - Right: 0 6a Motor Leg - Left: 1 6b Motor Leg - Right: 1 7 Limb Ataxia: 0 8 Sensory: 0 9 Best Language: 0 10 Dysarthria: 1 11 Extinct. and Inatten.: 0 TOTAL: 3  NIH stroke scale worsened to 6, TPA was administered then.  Labs I have reviewed labs in epic and the results pertinent to this consultation are:  CBC    Component Value Date/Time   WBC 5.1 09/11/2017 1755   RBC 4.11 (L) 09/11/2017 1755   HGB 12.6 (L) 09/11/2017 1801   HCT 37.0 (L) 09/11/2017 1801   PLT 112 (L) 09/11/2017 1755   MCV 94.4 09/11/2017 1755   MCH 31.1 09/11/2017 1755   MCHC 33.0 09/11/2017 1755   RDW 14.9 09/11/2017 1755   LYMPHSABS 1.7 09/11/2017 1755   MONOABS 0.4 09/11/2017 1755   EOSABS 0.3 09/11/2017 1755   BASOSABS 0.2 (H) 09/11/2017 1755    CMP     Component Value Date/Time   NA 140 09/11/2017 1801   K 4.1 09/11/2017 1801   CL 104 09/11/2017 1801   CO2 24 09/11/2017 1755   GLUCOSE 91 09/11/2017 1801   BUN 34 (H) 09/11/2017 1801   CREATININE 2.20 (H) 09/11/2017 1801   CALCIUM 9.1 09/11/2017 1755   PROT 6.0 (L) 09/11/2017 1755   ALBUMIN 3.7 09/11/2017 1755   AST 29 09/11/2017 1755   ALT 18 09/11/2017 1755   ALKPHOS 28 (L) 09/11/2017 1755   BILITOT 0.7 09/11/2017 1755   GFRNONAA 27 (L) 09/11/2017 1755   GFRAA 31 (L) 09/11/2017 1755    Imaging I  have reviewed the images obtained  CT-scan of the brain Shows no acute changes associated with ischemic stroke but does show a heavily calcified basilar and possible dense top of the basilar.  CT angiogram shows a dominant right vertebral artery which is heavily calcified and heavily calcified basilar with no opacification at the top of the basilar. Left vertebral artery ends in PICA. Left P2 thrombus.  MRI examination of the brain limited protocol - showed no acute stroke.  Assessment:  82 year old man with a past medical history of hypertension, coronary artery disease status post CABG, COPD on home oxygen, hypothyroidism, brought in for evaluation of sudden onset of altered mental status and possible right-sided weakness. He was brought in as a code stroke. On initial evaluation, he was nonfocal but within 15-20 minutes of arrival, his neurological exam deteriorated from a NIH stroke scale of 3 to NIH stroke scale of 6. His noncontrast CT of the head showed a possible thrombus in the top of the basilar. CT angiography confirmed that possible clot on the top of the basilar. Stat MRI done after administering TPA did not show any evidence of restricted diffusion area Case was discussed with neuro intervention list on call, and decision was made to hold off on any acute intervention at this time as patient's exam started to improve after TPA. Patient will be monitored closely in the ICU and neuro interventionalist will be recalled for an emergent procedure in his exam worsens.  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 New onset A. Fib Hypertension History of coronary artery disease COPD-on home oxygen  Plan:  CNS #Acute ischemic stroke - basilar occlusion -Status post TPA -Vitals for TPA protocol -No aspirin or heparin or Lovenox for 24 hours after TPA -Echocardiogram, MRI brain without contrast, hemoglobin A1c, lipid panel -Maintain on  telemetry -Hyperglycemia management per SSI to maintain glucose 140-180mg /dL. -PT/OT/ST therapies and recommendations when able -Possible diagnostic cerebral angiogram in the morning. Neuroendovascular consulted. -Might need emergent cerebral angio if exam worsens.  #Dysphagia and dysarthria following stroke -Speech therapy  #Hemiparesis involving dominant right side -PT OT consult  Cardiovascular #Hypertension -Systolic blood pressure goal less than 180 per TPA protocol -Cardene drip  #New onset A. Fib -Noted to have new onset atrial fibrillation with slow ventricular rate. -Heart rate dipped down to as low as 31. -I have requested the ED provider to call in a stat cardiology consult. We will appreciate cardiology input in this case. -No anti-coagulation for now. He status post TPA. -2-D echo. Might need TEE-decision based on clinical course and test results.  #h/o CAD s/p CABG -No antiplatelet for 24h post tPA  Respiratory #COPD on home oxygen -Check ABG -PCCM consult in ICU. We will appreciate help with management of his resp status.  Heme #Anemia -Dilutional verses iron deficiency -Monitor clinically -Repeat CBC in the morning  GI -Keep nothing by mouth -No active issues -Docs senna for constipation  GU Acute kidney injury on CKD -Patient received contrast for CTA. Keep on hydrating with normal saline 75 mL.  Prophylaxis DVT - SCD GI - NA Bowel - Doc/senna  DIET: STRICT NPO UNTIL CHANGED BY PHYSICIAN  CODE - DNR (But can be reversed temporarily if needs acute neuro intervention.) Healthcare proxy son-Wayne Turski [phone]  Present on admission: Right hemiparesis, dysarthria, hypertension, chronic kidney disease 4  Consults: PCCM, CARDIOLOGY, NEURO IR FOR  POSSIBLE ELECTIVE CEREBRAL ANGIO IN AM.  Delays in the process: Patient's imaging was delayed because of his labile heart rate. He was not initially a candidate for TPA because of exam not fully  consistent with a stroke and a low NIH stroke scale. But when his exam worsened,  NIH 3 to NIH 6, and imaging became available indicative of a probable basilar thrombus, decision was made to tPA then.  -- Amie Portland, MD [facility] [phone]  If 7pm to 7am, please call on call as listed on AMION.  CRITICAL CARE ATTESTATION This patient is critically ill and at significant risk of neurological worsening, death and care requires constant monitoring of vital signs, hemodynamics,respiratory and cardiac monitoring, neurological assessment, discussion with family, other specialists and medical decision making of high complexity. I spent 70  minutes of neurocritical care time  in the care of  this patient.

## 2017-09-11 NOTE — ED Notes (Signed)
Pt transporting to MRI with stroke team and this RN.

## 2017-09-11 NOTE — Consult Note (Signed)
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  As his neuro exam has improved, no plans for neurointervention.  Post TPA, he has had atrial fibrillation (seems to be a new diagnosis) with slow rates (40s).  Per neurology, antiplatelets should be held for 24 hours post TPA.    On interview, he is confused.  Denies chest pain, SOB.  In the weeks prior to this event, he has been having a productive cough; no cardiac complaints other than chronic LE edema.  No active symptoms.   Historically, Holters in the past have shown sinus rhythm with first degree AV block.    Previous Cardiac Studies: TTE 07/31/2014 Study Conclusions  - Left ventricle: The cavity size was normal. Wall thickness was increased in a pattern of mild LVH. There was mild focal basal hypertrophy of the septum. Systolic function was normal. The estimated ejection fraction was in the range of 50% to 55%. Wall motion was normal; there were no regional wall motion abnormalities. Doppler parameters are consistent with abnormal left ventricular relaxation (grade 1 diastolic dysfunction). - Mitral valve: Calcified annulus. - Left atrium: The atrium was moderately dilated. - Right atrium: The atrium was mildly dilated. - Atrial septum: There was an atrial septal aneurysm.  Past Medical History:  Diagnosis Date  . AAA (abdominal aortic aneurysm) ([place])   . Arthritis   . COPD (chronic obstructive pulmonary disease) ([place])   . Coronary artery disease   . Hypertension   . Hypothyroidism   . Myocardial infarction ([place])   . Shortness of breath    on exertion;  has productive cough at times  . Sleep apnea    states mild . no cpap    Past Surgical History:  Procedure Laterality Date  .  APPENDECTOMY  1963  . CATARACT EXTRACTION     w/ lid rise  . CORONARY ARTERY BYPASS GRAFT  2007  . EYE SURGERY Right March 2016   Cataract  . EYE SURGERY Bilateral May 2016   Eyebrow Lift  . EYE SURGERY Left June 2016   Cataract  . KNEE ARTHROSCOPY  02/26/2012   Procedure: ARTHROSCOPY KNEE;  Surgeon: Sydnee Cabal, MD;  [facility]: [facility];  Service: Orthopedics;  Laterality: Left;  medial menisectomy and chondraplasty    Family History  Problem Relation Age of Onset  . Diabetes Mother   . Heart disease Mother        Before age 32  . Diabetes Sister   . Hypertension Sister   . Diabetes Brother   . Hypertension Brother   . Hyperlipidemia Brother   . Heart attack Daughter   . Hypertension Son    Social History:  reports that he quit smoking about 17 years ago. His smoking use included Cigarettes. He has a 38.25 pack-year smoking history. He has never used smokeless tobacco. He reports that he drinks alcohol. He reports that he does not use drugs.  Allergies:  Allergies  Allergen Reactions  . Latex Rash  . Ointment, White Rash    Mycins ointment and pills  . Tape Rash    Reaction to adhesive tape - pls use paper tape    No current facility-administered medications on file prior to encounter.    Current Outpatient Prescriptions on File Prior to  Encounter  Medication Sig Dispense Refill  . Choline Fenofibrate (TRILIPIX) 135 MG capsule Take 135 mg by mouth daily after supper.     . ezetimibe (ZETIA) 10 MG tablet Take 10 mg by mouth daily after supper.     . famotidine (PEPCID) 20 MG tablet Take 20 mg by mouth daily as needed for heartburn or indigestion.     . furosemide (LASIX) 20 MG tablet One daily as needed for swelling (Patient taking differently: Take 20 mg by mouth every other day. ) 90 tablet 2  . MELATONIN PO Take 1 tablet by mouth at bedtime as needed (sleep).     . pregabalin (LYRICA) 50 MG capsule Take 50 mg by mouth 2 (two) times daily.    .  rosuvastatin (CRESTOR) 20 MG tablet Take 20 mg by mouth daily after supper.     . tiotropium (SPIRIVA) 18 MCG inhalation capsule Take 18 mcg by mouth daily.    . vitamin B-12 (CYANOCOBALAMIN) 1000 MCG tablet Take 1,000 mcg by mouth daily.    Marland Kitchen albuterol (PROAIR HFA) 108 (90 Base) MCG/ACT inhaler Inhale 2 puffs into the lungs every 6 (six) hours as needed for wheezing or shortness of breath. 3 Inhaler 2  . budesonide-formoterol (SYMBICORT) 160-4.5 MCG/ACT inhaler Inhale 2 puffs into the lungs 2 (two) times daily. 3 Inhaler 3  . OXYGEN 2lpm with exertion only      Results for orders placed or performed during the hospital encounter of 09/11/17 (from the past 48 hour(s))  Protime-INR     Status: None   Collection Time: 09/11/17  5:55 PM  Result Value Ref Range   Prothrombin Time 13.5 11.4 - 15.2 seconds   INR 1.04   APTT     Status: None   Collection Time: 09/11/17  5:55 PM  Result Value Ref Range   aPTT 30 24 - 36 seconds  CBC     Status: Abnormal   Collection Time: 09/11/17  5:55 PM  Result Value Ref Range   WBC 5.1 4.0 - 10.5 K/uL   RBC 4.11 (L) 4.22 - 5.81 MIL/uL   Hemoglobin 12.8 (L) 13.0 - 17.0 g/dL   HCT 38.8 (L) 39.0 - 52.0 %   MCV 94.4 78.0 - 100.0 fL   MCH 31.1 26.0 - 34.0 pg   MCHC 33.0 30.0 - 36.0 g/dL   RDW 14.9 11.5 - 15.5 %   Platelets 112 (L) 150 - 400 K/uL    Comment: REPEATED TO VERIFY PLATELET COUNT CONFIRMED BY SMEAR   Differential     Status: Abnormal   Collection Time: 09/11/17  5:55 PM  Result Value Ref Range   Neutrophils Relative % 50 %   Lymphocytes Relative 34 %   Monocytes Relative 8 %   Eosinophils Relative 5 %   Basophils Relative 3 %   Neutro Abs 2.5 1.7 - 7.7 K/uL   Lymphs Abs 1.7 0.7 - 4.0 K/uL   Monocytes Absolute 0.4 0.1 - 1.0 K/uL   Eosinophils Absolute 0.3 0.0 - 0.7 K/uL   Basophils Absolute 0.2 (H) 0.0 - 0.1 K/uL   WBC Morphology ATYPICAL LYMPHOCYTES   Comprehensive metabolic panel     Status: Abnormal   Collection Time: 09/11/17   5:55 PM  Result Value Ref Range   Sodium 138 135 - 145 mmol/L   Potassium 4.1 3.5 - 5.1 mmol/L   Chloride 107 101 - 111 mmol/L   CO2 24 22 - 32 mmol/L   Glucose, Bld 93 65 -  99 mg/dL   BUN 33 (H) 6 - 20 mg/dL   Creatinine, Ser 2.16 (H) 0.61 - 1.24 mg/dL   Calcium 9.1 8.9 - 10.3 mg/dL   Total Protein 6.0 (L) 6.5 - 8.1 g/dL   Albumin 3.7 3.5 - 5.0 g/dL   AST 29 15 - 41 U/L   ALT 18 17 - 63 U/L   Alkaline Phosphatase 28 (L) 38 - 126 U/L   Total Bilirubin 0.7 0.3 - 1.2 mg/dL   GFR calc non Af Amer 27 (L) >60 mL/min   GFR calc Af Amer 31 (L) >60 mL/min    Comment: (NOTE) The eGFR has been calculated using the CKD EPI equation. This calculation has not been validated in all clinical situations. eGFR's persistently <60 mL/min signify possible Chronic Kidney Disease.    Anion gap 7 5 - 15  I-stat troponin, ED     Status: None   Collection Time: 09/11/17  5:59 PM  Result Value Ref Range   Troponin i, poc 0.02 0.00 - 0.08 ng/mL   Comment 3            Comment: Due to the release kinetics of cTnI, a negative result within the first hours of the onset of symptoms does not rule out myocardial infarction with certainty. If myocardial infarction is still suspected, repeat the test at appropriate intervals.   I-Stat Chem 8, ED     Status: Abnormal   Collection Time: 09/11/17  6:01 PM  Result Value Ref Range   Sodium 140 135 - 145 mmol/L   Potassium 4.1 3.5 - 5.1 mmol/L   Chloride 104 101 - 111 mmol/L   BUN 34 (H) 6 - 20 mg/dL   Creatinine, Ser 2.20 (H) 0.61 - 1.24 mg/dL   Glucose, Bld 91 65 - 99 mg/dL   Calcium, Ion 1.14 (L) 1.15 - 1.40 mmol/L   TCO2 24 22 - 32 mmol/L   Hemoglobin 12.6 (L) 13.0 - 17.0 g/dL   HCT 37.0 (L) 39.0 - 52.0 %  I-Stat CG4 Lactic Acid, ED     Status: None   Collection Time: 09/11/17  6:11 PM  Result Value Ref Range   Lactic Acid, Venous 0.95 0.5 - 1.9 mmol/L  CBG monitoring, ED     Status: None   Collection Time: 09/11/17  6:19 PM  Result Value Ref Range    Glucose-Capillary 90 65 - 99 mg/dL   Ct Angio Head W Or Wo Contrast  Result Date: 09/11/2017 CLINICAL DATA:  Stroke EXAM: CT ANGIOGRAPHY HEAD AND NECK CT PERFUSION BRAIN TECHNIQUE: Multidetector CT imaging of the head and neck was performed using the standard protocol during bolus administration of intravenous contrast. Multiplanar CT image reconstructions and MIPs were obtained to evaluate the vascular anatomy. Carotid stenosis measurements (when applicable) are obtained utilizing NASCET criteria, using the distal internal carotid diameter as the denominator. Multiphase CT imaging of the brain was performed following IV bolus contrast injection. Subsequent parametric perfusion maps were calculated using RAPID software. CONTRAST:  100 mL Isovue 370 IV COMPARISON:  CT head 09/11/2017 FINDINGS: CTA NECK FINDINGS Aortic arch: Moderate atherosclerotic calcification in the aortic arch without aneurysm. Proximal great vessels diffusely diseased with calcification but patent. Right carotid system: Right common carotid artery widely patent with minimal atherosclerotic disease. Heavily calcified plaque at the right carotid bifurcation. 70% diameter stenosis right internal carotid artery Left carotid system: Scattered atherosclerotic plaque in the left common carotid artery without significant stenosis. Atherosclerotic calcification in the left carotid  bulb 50% diameter stenosis left internal carotid artery Vertebral arteries: Right vertebral dominant. Large calcified plaque right vertebral artery origin. Proximal and mid right vertebral artery occluded. There is reconstitution of the distal right vertebral artery with minimal opacification. The right vertebral artery is diffusely calcified distally. Small left vertebral artery with moderate stenosis proximally. Left vertebral artery ends in PICA and does not contribute to the basilar. Skeleton: No acute skeletal abnormality. Other neck: No mass lesion in the neck.  Upper chest: Apical emphysema. Bilateral small pleural effusions. Apical scarring bilaterally. Review of the MIP images confirms the above findings CTA HEAD FINDINGS Anterior circulation: Atherosclerotic calcification throughout the right internal carotid artery through the skull base and cavernous segment with moderate stenosis. Hypoplastic right A1 segment. Right middle cerebral artery patent without stenosis Atherosclerotic calcification throughout the left internal carotid artery at the skullbase and throughout the left cavernous carotid with moderate stenosis. Both anterior cerebral arteries are supplied from the left and patent. Left middle cerebral artery patent without significant stenosis or occlusion. Posterior circulation: Severe atherosclerotic distal right vertebral artery which shows faint opacification. The right vertebral artery is occluded in the proximal and mid segment. The basilar is diffusely diseased but patent. There is a filling defect in the distal basilar extending into the left P1. Left posterior cerebral artery is patent. Right posterior cerebral artery is patent in the P1 segment and then occludes due to thrombus in the right P2 segment. Small left vertebral artery ends in PICA Venous sinuses: Patent Anatomic variants: None Delayed phase: Not performed Review of the MIP images confirms the above findings CT Brain Perfusion Findings: CBF (<30%) Volume: 74m Perfusion (Tmax>6.0s) volume: 1352mMismatch Volume: 13645mnfarction Location:None Delayed perfusion is seen throughout the brainstem and right cerebellum. This could be artifactual. There is also delayed perfusion in the watershed territory of both cerebral hemispheres, right greater than left IMPRESSION: 70% diameter stenosis right internal carotid artery due to heavily calcified plaque. Moderate stenosis right cavernous carotid. 50% diameter stenosis left internal carotid artery due to heavily calcified plaque. Moderate stenosis  left cavernous carotid Dominant right vertebral artery which is severely diseased and heavily calcified. The right vertebral artery is occluded in the proximal and midportion with faint reconstitution distally. The basilar is diffusely diseased. There is acute thrombus in the distal basilar. Left posterior cerebral artery is patent with thrombus overlying the left PCA origin. There is occlusion of the right P2 segment due to clot. Left vertebral artery is small and ends in PICA Abnormal CT perfusion with multiple areas of delayed perfusion due to severe atherosclerotic disease. No infarct on CT perfusion. These results were called by telephone at the time of interpretation on 09/11/2017 at 7:10 pm to Dr. AroRory Percyho verbally acknowledged these results. Electronically Signed   By: ChaFranchot GalloD.   On: 09/11/2017 19:13   Ct Angio Neck W Or Wo Contrast  Result Date: 09/11/2017 CLINICAL DATA:  Stroke EXAM: CT ANGIOGRAPHY HEAD AND NECK CT PERFUSION BRAIN TECHNIQUE: Multidetector CT imaging of the head and neck was performed using the standard protocol during bolus administration of intravenous contrast. Multiplanar CT image reconstructions and MIPs were obtained to evaluate the vascular anatomy. Carotid stenosis measurements (when applicable) are obtained utilizing NASCET criteria, using the distal internal carotid diameter as the denominator. Multiphase CT imaging of the brain was performed following IV bolus contrast injection. Subsequent parametric perfusion maps were calculated using RAPID software. CONTRAST:  100 mL Isovue 370 IV COMPARISON:  CT head  09/11/2017 FINDINGS: CTA NECK FINDINGS Aortic arch: Moderate atherosclerotic calcification in the aortic arch without aneurysm. Proximal great vessels diffusely diseased with calcification but patent. Right carotid system: Right common carotid artery widely patent with minimal atherosclerotic disease. Heavily calcified plaque at the right carotid bifurcation. 70%  diameter stenosis right internal carotid artery Left carotid system: Scattered atherosclerotic plaque in the left common carotid artery without significant stenosis. Atherosclerotic calcification in the left carotid bulb 50% diameter stenosis left internal carotid artery Vertebral arteries: Right vertebral dominant. Large calcified plaque right vertebral artery origin. Proximal and mid right vertebral artery occluded. There is reconstitution of the distal right vertebral artery with minimal opacification. The right vertebral artery is diffusely calcified distally. Small left vertebral artery with moderate stenosis proximally. Left vertebral artery ends in PICA and does not contribute to the basilar. Skeleton: No acute skeletal abnormality. Other neck: No mass lesion in the neck. Upper chest: Apical emphysema. Bilateral small pleural effusions. Apical scarring bilaterally. Review of the MIP images confirms the above findings CTA HEAD FINDINGS Anterior circulation: Atherosclerotic calcification throughout the right internal carotid artery through the skull base and cavernous segment with moderate stenosis. Hypoplastic right A1 segment. Right middle cerebral artery patent without stenosis Atherosclerotic calcification throughout the left internal carotid artery at the skullbase and throughout the left cavernous carotid with moderate stenosis. Both anterior cerebral arteries are supplied from the left and patent. Left middle cerebral artery patent without significant stenosis or occlusion. Posterior circulation: Severe atherosclerotic distal right vertebral artery which shows faint opacification. The right vertebral artery is occluded in the proximal and mid segment. The basilar is diffusely diseased but patent. There is a filling defect in the distal basilar extending into the left P1. Left posterior cerebral artery is patent. Right posterior cerebral artery is patent in the P1 segment and then occludes due to thrombus  in the right P2 segment. Small left vertebral artery ends in PICA Venous sinuses: Patent Anatomic variants: None Delayed phase: Not performed Review of the MIP images confirms the above findings CT Brain Perfusion Findings: CBF (<30%) Volume: 26m Perfusion (Tmax>6.0s) volume: 136mMismatch Volume: 13639mnfarction Location:None Delayed perfusion is seen throughout the brainstem and right cerebellum. This could be artifactual. There is also delayed perfusion in the watershed territory of both cerebral hemispheres, right greater than left IMPRESSION: 70% diameter stenosis right internal carotid artery due to heavily calcified plaque. Moderate stenosis right cavernous carotid. 50% diameter stenosis left internal carotid artery due to heavily calcified plaque. Moderate stenosis left cavernous carotid Dominant right vertebral artery which is severely diseased and heavily calcified. The right vertebral artery is occluded in the proximal and midportion with faint reconstitution distally. The basilar is diffusely diseased. There is acute thrombus in the distal basilar. Left posterior cerebral artery is patent with thrombus overlying the left PCA origin. There is occlusion of the right P2 segment due to clot. Left vertebral artery is small and ends in PICA Abnormal CT perfusion with multiple areas of delayed perfusion due to severe atherosclerotic disease. No infarct on CT perfusion. These results were called by telephone at the time of interpretation on 09/11/2017 at 7:10 pm to Dr. AroRory Percyho verbally acknowledged these results. Electronically Signed   By: ChaFranchot GalloD.   On: 09/11/2017 19:13   Ct Cerebral Perfusion W Contrast  Result Date: 09/11/2017 CLINICAL DATA:  Stroke EXAM: CT ANGIOGRAPHY HEAD AND NECK CT PERFUSION BRAIN TECHNIQUE: Multidetector CT imaging of the head and neck was performed using the standard  protocol during bolus administration of intravenous contrast. Multiplanar CT image reconstructions  and MIPs were obtained to evaluate the vascular anatomy. Carotid stenosis measurements (when applicable) are obtained utilizing NASCET criteria, using the distal internal carotid diameter as the denominator. Multiphase CT imaging of the brain was performed following IV bolus contrast injection. Subsequent parametric perfusion maps were calculated using RAPID software. CONTRAST:  100 mL Isovue 370 IV COMPARISON:  CT head 09/11/2017 FINDINGS: CTA NECK FINDINGS Aortic arch: Moderate atherosclerotic calcification in the aortic arch without aneurysm. Proximal great vessels diffusely diseased with calcification but patent. Right carotid system: Right common carotid artery widely patent with minimal atherosclerotic disease. Heavily calcified plaque at the right carotid bifurcation. 70% diameter stenosis right internal carotid artery Left carotid system: Scattered atherosclerotic plaque in the left common carotid artery without significant stenosis. Atherosclerotic calcification in the left carotid bulb 50% diameter stenosis left internal carotid artery Vertebral arteries: Right vertebral dominant. Large calcified plaque right vertebral artery origin. Proximal and mid right vertebral artery occluded. There is reconstitution of the distal right vertebral artery with minimal opacification. The right vertebral artery is diffusely calcified distally. Small left vertebral artery with moderate stenosis proximally. Left vertebral artery ends in PICA and does not contribute to the basilar. Skeleton: No acute skeletal abnormality. Other neck: No mass lesion in the neck. Upper chest: Apical emphysema. Bilateral small pleural effusions. Apical scarring bilaterally. Review of the MIP images confirms the above findings CTA HEAD FINDINGS Anterior circulation: Atherosclerotic calcification throughout the right internal carotid artery through the skull base and cavernous segment with moderate stenosis. Hypoplastic right A1 segment. Right  middle cerebral artery patent without stenosis Atherosclerotic calcification throughout the left internal carotid artery at the skullbase and throughout the left cavernous carotid with moderate stenosis. Both anterior cerebral arteries are supplied from the left and patent. Left middle cerebral artery patent without significant stenosis or occlusion. Posterior circulation: Severe atherosclerotic distal right vertebral artery which shows faint opacification. The right vertebral artery is occluded in the proximal and mid segment. The basilar is diffusely diseased but patent. There is a filling defect in the distal basilar extending into the left P1. Left posterior cerebral artery is patent. Right posterior cerebral artery is patent in the P1 segment and then occludes due to thrombus in the right P2 segment. Small left vertebral artery ends in PICA Venous sinuses: Patent Anatomic variants: None Delayed phase: Not performed Review of the MIP images confirms the above findings CT Brain Perfusion Findings: CBF (<30%) Volume: 67m Perfusion (Tmax>6.0s) volume: 1332mMismatch Volume: 13666mnfarction Location:None Delayed perfusion is seen throughout the brainstem and right cerebellum. This could be artifactual. There is also delayed perfusion in the watershed territory of both cerebral hemispheres, right greater than left IMPRESSION: 70% diameter stenosis right internal carotid artery due to heavily calcified plaque. Moderate stenosis right cavernous carotid. 50% diameter stenosis left internal carotid artery due to heavily calcified plaque. Moderate stenosis left cavernous carotid Dominant right vertebral artery which is severely diseased and heavily calcified. The right vertebral artery is occluded in the proximal and midportion with faint reconstitution distally. The basilar is diffusely diseased. There is acute thrombus in the distal basilar. Left posterior cerebral artery is patent with thrombus overlying the left PCA  origin. There is occlusion of the right P2 segment due to clot. Left vertebral artery is small and ends in PICA Abnormal CT perfusion with multiple areas of delayed perfusion due to severe atherosclerotic disease. No infarct on CT perfusion. These results were  called by telephone at the time of interpretation on 09/11/2017 at 7:10 pm to Dr. Rory Percy, who verbally acknowledged these results. Electronically Signed   By: Franchot Gallo M.D.   On: 09/11/2017 19:13   Mr Brain Limited Wo Contrast  Result Date: 09/11/2017 CLINICAL DATA:  Basilar occlusion.  Stroke. EXAM: MRI HEAD WITHOUT CONTRAST TECHNIQUE: Multiplanar, multiecho pulse sequences of the brain and surrounding structures were obtained without intravenous contrast. COMPARISON:  CT and CT perfusion 09/11/2017 FINDINGS: Brain: Limited sequences. Diffusion and axial FLAIR imaging only was performed. Negative for acute infarct. Generalized atrophy. Minimal chronic ischemic change in the white matter. IMPRESSION: Negative for acute infarct on limited imaging. These results were called by telephone at the time of interpretation on 09/11/2017 at 7:28 pm to Dr. Amie [place] , who verbally acknowledged these results. Electronically Signed   By: Franchot Gallo M.D.   On: 09/11/2017 19:28   Ct Head Code Stroke Wo Contrast  Result Date: 09/11/2017 CLINICAL DATA:  Code stroke. Focal neuro deficit less than 6 hours. Right-sided weakness EXAM: CT HEAD WITHOUT CONTRAST TECHNIQUE: Contiguous axial images were obtained from the base of the skull through the vertex without intravenous contrast. COMPARISON:  MRI 01/17/2015 FINDINGS: Brain: Generalized atrophy. Negative for hydrocephalus. Negative for acute infarct, hemorrhage, mass lesion. Vascular: Extensive calcification in the right vertebral artery and basilar. Calcification also in the cavernous carotid bilaterally. Hyperdensity distal basilar suggestive of acute thrombosis. Skull: Negative Sinuses/Orbits: Mild mucosal  edema paranasal sinuses. Bilateral cataract removal. Other: None ASPECTS ([place] Stroke Program Early CT Score) - Ganglionic level infarction (caudate, lentiform nuclei, internal capsule, insula, M1-M3 cortex): 7 - Supraganglionic infarction (M4-M6 cortex): 3 Total score (0-10 with 10 being normal): 10 IMPRESSION: 1. No acute infarct. 2. Hyperdense distal basilar suggestive of acute thrombosis. 3. ASPECTS is 10 4. These results were called by telephone at the time of interpretation on 09/11/2017 at 6:18 pm to Dr. Rory Percy, who verbally acknowledged these results. Electronically Signed   By: Franchot Gallo M.D.   On: 09/11/2017 18:21    ECG/TELE: Afib, rates 40s-50s  ROS: As above. Otherwise, review of systems is negative unless per above HPI  Vitals:   09/11/17 1842 09/11/17 1845 09/11/17 1935 09/11/17 1948  BP: (!) 170/91 (!) 168/79 (!) 183/107 (!) 140/51  Pulse: (!) 46 (!) 46 (!) 43 (!) 47  Resp: (!) 24 (!) 22 16 (!) 21  SpO2: 93% 96% 95% 97%   Wt Readings from Last 10 Encounters:  07/27/17 100.2 kg (221 lb)  04/26/17 102.5 kg (226 lb)  02/17/17 104.8 kg (231 lb)  11/05/16 102.5 kg (226 lb)  10/27/16 106.4 kg (234 lb 9.6 oz)  07/27/16 104.5 kg (230 lb 6.4 oz)  04/17/16 105.2 kg (232 lb)  04/02/16 108.5 kg (239 lb 3.4 oz)  11/06/15 102.5 kg (226 lb)  04/02/15 107 kg (236 lb)    PE:  General: No acute distress HEENT: Atraumatic, EOMI, mucous membranes moist. No JVD at 45 degrees. No HJR. CV: slow, iRiR, no murmurs, gallops.  Respiratory: faint wheezing. Normal work of breathing ABD: Non-distended and non-tender. No palpable organomegaly.  Extremities: 2+ radial pulses bilaterally. 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 At this point on my interview, HR frequently in  the 50s and no bradycardia symptoms.  Would reserve atropine if symptomatic  bradycardia.  I've added on a TSH given history of hypothyroidism.  New atrial fibrillation will likely need systemic anticoagulation, when safe from a neurologic bleeding standpoint s/p systemic TPA.  Ok to hold aspirin per neurology, would restart when safe.  Agree with TTE.  Cardiology will continue to follow along.  Lolita Cram Lanise Mergen  MD 09/11/2017, 8:36 PM

## 2017-09-11 NOTE — ED Notes (Signed)
Patient transported to X-ray 

## 2017-09-11 NOTE — ED Notes (Signed)
Attempted to call report

## 2017-09-11 NOTE — ED Notes (Signed)
TPA initiated.

## 2017-09-11 NOTE — Progress Notes (Signed)
Patient ABG order but unable to stick at this time due to Patient just completing TPA. Patient is currently in no distress and sating 93-90% on Room Air. Will attempt ABG when time is up for safety, unless otherwise notified to stick earlier by MD. MD currently in Rapid Response with another patient via RN.

## 2017-09-11 NOTE — ED Notes (Addendum)
Neurohospitalist was a bedside and told pt, family and RN NPO. Stroke screen deferred

## 2017-09-11 NOTE — Code Documentation (Signed)
82 y.o. male with a PMHx of HTN, DM, HLD, OSA, CAD s/p CABG, COPD on home O2, hypothyroidism, who was stated to be in his usual state of health today until about 1715 when he had an acute onset of unresponsiveness witnessed by his girlfriend. They were cooking dinner at home and he suddenly became unresponsive. EMS was notified and appreciated right-sided weakness and confusion with SBP's in the low 200's and atrial fibrillation with slow ventricular rate in the 30's and 40's. Activated LVO positive code stroke.  On arrival to [facility] ED, the patient was met at the bridge by the stroke team, airway evaluated and cleared, labs drawn and taken to CT. D/t patient's heart rate and rhythm, the patient was closely evaluated by the EDP and the patient hooked up to telemetry prior to beginning CT. On initial assessment, the patient was lethargic, dysarthric, poor attention and concentration and nonfocal in terms of weakness. NIHSS 4. VAN negative. CT of the head, revealed a heavily calcified right vertebral artery and basilar artery, and also had a suspicious looking finding which was concerning for thrombus on the distal basilar. ASPECTS 10. This was confirmed with the neuroradiologist on call who also agreed with the finding of possible thrombus in the setting of a heavily calcified basilar.  Repeat exam of the patient showed disconjugate gaze, inability to cross midline to the right and worsening dysarthria. He was also at that time weak on the RUE. NIHSS 6. At that time, decision was made to use IV tPA. IV tPA not initially given d/t being too mild to treat and exam was not fully consistent with a stroke. Patient's imaging was delayed because of his labile heart rate. Foley placed d/t possibility of patient going to IR. CTA showed a heavily calcified right vertebral artery, stenotic basilar with probable distal occlusion, and left P2 occlusion. Left vertebral artery was nondominant and ended in a PICA. CTP with core vol  of 35m and mismatch vol of 1328m   Case discussed with IR MD. STAT MRI ordered. MRI of the brain did not reveal any evidence of acute ischemia in the brainstem. The patient's exam also started to improve, with him becoming more awake, following all commands, speech becoming less dysarthric. He still did have mild right facial droop and mild RUE weakness. His gaze had no restriction and extraocular movements became intact. At that time, decision was made not to take him for an acute intervention. He will be admitted to the ICU at this time. ED bedside handoff with ED RN's [place] JaJeneen Rinks

## 2017-09-12 ENCOUNTER — Other Ambulatory Visit (HOSPITAL_COMMUNITY): Payer: Medicare Other

## 2017-09-12 ENCOUNTER — Encounter (HOSPITAL_COMMUNITY): Payer: Self-pay | Admitting: *Deleted

## 2017-09-12 ENCOUNTER — Inpatient Hospital Stay (HOSPITAL_COMMUNITY): Payer: Medicare Other

## 2017-09-12 DIAGNOSIS — I480 Paroxysmal atrial fibrillation: Secondary | ICD-10-CM

## 2017-09-12 DIAGNOSIS — I639 Cerebral infarction, unspecified: Secondary | ICD-10-CM

## 2017-09-12 DIAGNOSIS — Z951 Presence of aortocoronary bypass graft: Secondary | ICD-10-CM

## 2017-09-12 DIAGNOSIS — I10 Essential (primary) hypertension: Secondary | ICD-10-CM

## 2017-09-12 DIAGNOSIS — I6312 Cerebral infarction due to embolism of basilar artery: Secondary | ICD-10-CM

## 2017-09-12 DIAGNOSIS — I4430 Unspecified atrioventricular block: Secondary | ICD-10-CM

## 2017-09-12 DIAGNOSIS — I6529 Occlusion and stenosis of unspecified carotid artery: Secondary | ICD-10-CM

## 2017-09-12 DIAGNOSIS — I4891 Unspecified atrial fibrillation: Secondary | ICD-10-CM

## 2017-09-12 DIAGNOSIS — I481 Persistent atrial fibrillation: Secondary | ICD-10-CM

## 2017-09-12 DIAGNOSIS — I2510 Atherosclerotic heart disease of native coronary artery without angina pectoris: Secondary | ICD-10-CM

## 2017-09-12 DIAGNOSIS — E785 Hyperlipidemia, unspecified: Secondary | ICD-10-CM

## 2017-09-12 DIAGNOSIS — G450 Vertebro-basilar artery syndrome: Secondary | ICD-10-CM

## 2017-09-12 DIAGNOSIS — I4819 Other persistent atrial fibrillation: Secondary | ICD-10-CM

## 2017-09-12 HISTORY — PX: IR ANGIO VERTEBRAL SEL SUBCLAVIAN INNOMINATE UNI R MOD SED: IMG5365

## 2017-09-12 HISTORY — PX: IR ANGIO INTRA EXTRACRAN SEL COM CAROTID INNOMINATE BILAT MOD SED: IMG5360

## 2017-09-12 LAB — LIPID PANEL
CHOL/HDL RATIO: 2.5 ratio
Cholesterol: 109 mg/dL (ref 0–200)
HDL: 43 mg/dL (ref >40)
LDL Cholesterol: 46 mg/dL (ref 0–99)
Triglycerides: 102 mg/dL (ref <150)
VLDL: 20 mg/dL (ref 0–40)

## 2017-09-12 LAB — MRSA PCR SCREENING: MRSA BY PCR: NEGATIVE

## 2017-09-12 LAB — HEMOGLOBIN A1C
Hgb A1c MFr Bld: 5.4 % (ref 4.8–5.6)
Mean Plasma Glucose: 108.28 mg/dL

## 2017-09-12 LAB — TSH: TSH: 1.794 u[IU]/mL (ref 0.350–4.500)

## 2017-09-12 MED ORDER — MIDAZOLAM HCL 2 MG/2ML IJ SOLN
INTRAMUSCULAR | Status: AC | PRN
  Administered 2017-09-12: 0.5 mg via INTRAVENOUS

## 2017-09-12 MED ORDER — FENTANYL CITRATE (PF) 100 MCG/2ML IJ SOLN
INTRAMUSCULAR | Status: AC
  Filled 2017-09-12: qty 2

## 2017-09-12 MED ORDER — HEPARIN SODIUM (PORCINE) 1000 UNIT/ML IJ SOLN
INTRAMUSCULAR | Status: AC
  Filled 2017-09-12: qty 2

## 2017-09-12 MED ORDER — LIDOCAINE HCL (PF) 2 % IJ SOLN
INTRAMUSCULAR | Status: AC | PRN
  Administered 2017-09-12: 10 mL

## 2017-09-12 MED ORDER — IOPAMIDOL (ISOVUE-300) INJECTION 61%
INTRAVENOUS | Status: AC
  Administered 2017-09-12: 75 mL
  Filled 2017-09-12: qty 150

## 2017-09-12 MED ORDER — ROSUVASTATIN CALCIUM 20 MG PO TABS
20.0000 mg | ORAL_TABLET | Freq: Every day | ORAL | Status: DC
  Administered 2017-09-12 – 2017-09-13 (×2): 20 mg via ORAL
  Filled 2017-09-12 (×2): qty 1

## 2017-09-12 MED ORDER — LEVOTHYROXINE SODIUM 88 MCG PO TABS
88.0000 ug | ORAL_TABLET | Freq: Every day | ORAL | Status: DC
  Administered 2017-09-13 – 2017-09-14 (×2): 88 ug via ORAL
  Filled 2017-09-12 (×2): qty 1

## 2017-09-12 MED ORDER — SODIUM CHLORIDE 0.9 % IV SOLN: INTRAVENOUS | Status: DC

## 2017-09-12 MED ORDER — MIDAZOLAM HCL 2 MG/2ML IJ SOLN
INTRAMUSCULAR | Status: AC
  Filled 2017-09-12: qty 4

## 2017-09-12 MED ORDER — IOPAMIDOL (ISOVUE-300) INJECTION 61%
INTRAVENOUS | Status: AC
  Filled 2017-09-12: qty 50

## 2017-09-12 MED ORDER — ASPIRIN EC 81 MG PO TBEC: 81.0000 mg | DELAYED_RELEASE_TABLET | Freq: Every day | ORAL | Status: DC

## 2017-09-12 MED ORDER — HEPARIN SODIUM (PORCINE) 1000 UNIT/ML IJ SOLN
INTRAMUSCULAR | Status: AC | PRN
  Administered 2017-09-12: 1000 [IU] via INTRAVENOUS

## 2017-09-12 MED ORDER — FAMOTIDINE 20 MG PO TABS
20.0000 mg | ORAL_TABLET | Freq: Every day | ORAL | Status: DC
  Administered 2017-09-12 – 2017-09-14 (×3): 20 mg via ORAL
  Filled 2017-09-12 (×3): qty 1

## 2017-09-12 MED ORDER — APIXABAN 2.5 MG PO TABS
2.5000 mg | ORAL_TABLET | Freq: Two times a day (BID) | ORAL | Status: DC
  Administered 2017-09-12 – 2017-09-14 (×4): 2.5 mg via ORAL
  Filled 2017-09-12 (×5): qty 1

## 2017-09-12 MED ORDER — LIDOCAINE HCL (PF) 2 % IJ SOLN
INTRAMUSCULAR | Status: AC
  Filled 2017-09-12: qty 20

## 2017-09-12 MED ORDER — ASPIRIN EC 81 MG PO TBEC
81.0000 mg | DELAYED_RELEASE_TABLET | Freq: Every day | ORAL | Status: DC
  Administered 2017-09-12 – 2017-09-14 (×3): 81 mg via ORAL
  Filled 2017-09-12 (×3): qty 1

## 2017-09-12 MED ORDER — PREGABALIN 25 MG PO CAPS
50.0000 mg | ORAL_CAPSULE | Freq: Two times a day (BID) | ORAL | Status: DC
  Administered 2017-09-12 – 2017-09-14 (×5): 50 mg via ORAL
  Filled 2017-09-12 (×3): qty 1
  Filled 2017-09-12: qty 2
  Filled 2017-09-12: qty 1

## 2017-09-12 MED ORDER — FENTANYL CITRATE (PF) 100 MCG/2ML IJ SOLN
INTRAMUSCULAR | Status: AC | PRN
  Administered 2017-09-12: 12.5 ug via INTRAVENOUS

## 2017-09-12 MED ORDER — INFLUENZA VAC SPLIT HIGH-DOSE 0.5 ML IM SUSY
0.5000 mL | PREFILLED_SYRINGE | INTRAMUSCULAR | Status: AC
  Administered 2017-09-13: 0.5 mL via INTRAMUSCULAR
  Filled 2017-09-12: qty 0.5

## 2017-09-12 MED ORDER — EZETIMIBE 10 MG PO TABS
10.0000 mg | ORAL_TABLET | Freq: Every day | ORAL | Status: DC
  Administered 2017-09-12 – 2017-09-14 (×3): 10 mg via ORAL
  Filled 2017-09-12 (×3): qty 1

## 2017-09-12 NOTE — Progress Notes (Addendum)
STROKE TEAM PROGRESS NOTE   HISTORY OF PRESENT ILLNESS (per record) Derek [name] is a 82 y.o. male was a past medical history of hypertension, coronary artery disease status post CABG, COPD on home oxygen, and hypothyroidism, who was in his usual state of health until about 5:30 PM when he had sudden onset of unresponsiveness witnessed by his girlfriend. They were cooking at home and he suddenly became unresponsive. She called 911 immediately. The patient was evaluated by EMS and found to have right-sided weakness and was not answering questions appropriately. Activated large vessel occlusion code stroke and brought the patient to the ER. In the emergency room, at the bridge on first examination, the patient was lethargic, dysarthric, poor attention concentration and nonfocal in terms of weakness. He was taken in for a stat noncontrast CT of the head, that revealed a heavily calcified right vertebral artery and basilar artery on the noncontrast CT of the head, and also had a suspicious looking finding which was concerning for thrombus on the distal basilar. This was confirmed with the neuroradiologist on call who also agreed with the finding of possible thrombus in the setting of a heavily calcified basilar. Repeat exam of the patient showed disconjugate gaze, inability to cross midline to the right and worsening dysarthria. He was also at that time weak on the right upper extremity. At that time, decision was made to use IV TPA. A CT angiogram of the head was done, which showed a heavily calcified right vertebral artery, stenotic basilar with probable distal occlusion, and left P2 occlusion. Left vertebral artery was nondominant and ended in a PICA. As these imaging studies were done, we also ordered a stat MRI brain to evaluate for any evidence of infarct. Neuro interventionist, Dr. Estanislado Pandy, was also contacted and reviewed the images. MRI of the brain did not reveal any evidence of acute ischemia  in the brainstem. The patient's exam also started to improve, with him becoming more awake, following all commands, speech becoming less dysarthric. He still did have mild right facial droop and mild right upper extremity weakness. His gaze had no restriction and extraocular movements became intact. At that time, decision was made not to take him for an acute intervention. He will be admitted to the ICU at this time. If his neuro status changes, neurology will reevaluate and the neuro interventionalist will be recontacted for emergent procedure.  Patient self and reported that he had been having cough and thick phlegm for the prior 2 weeks. He did not have any fever of chills. He was complaining of some shortness of breath. He had no chest discomfort. No palpitations.  EMS also noted that his EKG showed atrial fibrillation with slow ventricular rate.   Patients HR was in 30s to 40s for the longest time in the CT and his imaging (CTA and MRI was delayed because of this)  LKW: 5:15 PM tpa given?: Yes TPA Saturday 09/11/2017 at 2030 Premorbid modified Rankin scale (mRS): 1    SUBJECTIVE (INTERVAL HISTORY) His RN is at the bedside. Pt has been doing well over night, only mild to moderate right facial droop as residue. BP stable. HR still at 40s. DSA pending. Cardiology consult pending   OBJECTIVE Temp:  [98 F (36.7 C)-98.2 F (36.8 C)] 98 F (36.7 C) (09/23 0400) Pulse Rate:  [30-140] 37 (09/23 0400) Cardiac Rhythm: Atrial fibrillation (09/22 2151) Resp:  [13-24] 17 (09/23 0400) BP: (108-187)/(49-107) 167/64 (09/23 0400) SpO2:  [90 %-97 %] 91 % (09/23  0400) Weight:  [217 lb 9.5 oz (98.7 kg)] 217 lb 9.5 oz (98.7 kg) (09/22 2151)  CBC:  Recent Labs Lab 09/11/17 1755 09/11/17 1801  WBC 5.1  --   NEUTROABS 2.5  --   HGB 12.8* 12.6*  HCT 38.8* 37.0*  MCV 94.4  --   PLT 112*  --     Basic Metabolic Panel:  Recent Labs Lab 09/11/17 1755 09/11/17 1801  NA 138 140  K 4.1 4.1   CL 107 104  CO2 24  --   GLUCOSE 93 91  BUN 33* 34*  CREATININE 2.16* 2.20*  CALCIUM 9.1  --     Lipid Panel:    Component Value Date/Time   CHOL 109 09/12/2017 0217   TRIG 102 09/12/2017 0217   HDL 43 09/12/2017 0217   CHOLHDL 2.5 09/12/2017 0217   VLDL 20 09/12/2017 0217   LDLCALC 46 09/12/2017 0217   HgbA1c:  Lab Results  Component Value Date   HGBA1C 5.4 09/12/2017   Urine Drug Screen: No results found for: LABOPIA, COCAINSCRNUR, LABBENZ, AMPHETMU, THCU, LABBARB  Alcohol Level No results found for: [place] I have personally reviewed the radiological images below and agree with the radiology interpretations.  Ct Angio Head W Or Wo Contrast Ct Angio Neck W Or Wo Contrast Ct Cerebral Perfusion W Contrast 09/11/2017 IMPRESSION:  70% diameter stenosis right internal carotid artery due to heavily calcified plaque. Moderate stenosis right cavernous carotid. 50% diameter stenosis left internal carotid artery due to heavily calcified plaque. Moderate stenosis left cavernous carotid Dominant right vertebral artery which is severely diseased and heavily calcified.  The right vertebral artery is occluded in the proximal and midportion with faint reconstitution distally.  The basilar is diffusely diseased. There is acute thrombus in the distal basilar.  Left posterior cerebral artery is patent with thrombus overlying the left PCA origin.  There is occlusion of the right P2 segment due to clot.  Left vertebral artery is small and ends in PICA  Abnormal CT perfusion with multiple areas of delayed perfusion due to severe atherosclerotic disease. No infarct on CT perfusion.   Mr Brain Limited Wo Contrast 09/11/2017 Negative for acute infarct on limited imaging.   Ct Head Code Stroke Wo Contrast 09/11/2017 IMPRESSION:  1. No acute infarct.  2. Hyperdense distal basilar suggestive of acute thrombosis.  3. ASPECTS is 10   Dg Chest 2 View 09/11/2017 Mild cardiomegaly with mild  vascular congestion/edema. Previous granulomas disease. Aortic Atherosclerosis (ICD10-I70.0).   Cerebral Angiogram 09/12/2017 1 Dominant RT VA origin recent  occlusion  over underlying stenosis with partial distal distal reconstituition of RT VA at C1. BA patent. 2. Approx 75 % stenosis of RT ICA prox . 3.7.47mm x 3.3 RT ICA cavernous aneyrysm,and/2.38mm x 2.84mm LT PCOM aneurysm   TTE pending  MRI brain pending   PHYSICAL EXAM Vitals:   09/12/17 0504 09/12/17 0600 09/12/17 0700 09/12/17 0800  BP: (!) 169/62 (!) 166/49 (!) 149/58 (!) 152/57  Pulse: (!) 43 (!) 46 (!) 39 (!) 38  Resp: 12 14 16 16   Temp:      TempSrc:      SpO2: 94% 94% 94% 94%  Weight:      Height:        Temp:  [98 F (36.7 C)-98.2 F (36.8 C)] 98 F (36.7 C) (09/23 0400) Pulse Rate:  [30-140] 41 (09/23 1025) Resp:  [12-28] 28 (09/23 1025) BP: (108-187)/(49-107) 161/70 (09/23 1025) SpO2:  [90 %-97 %]  94 % (09/23 1025) Weight:  [217 lb 9.5 oz (98.7 kg)] 217 lb 9.5 oz (98.7 kg) (09/22 2151)  General - Well nourished, well developed, in no apparent distress.  Ophthalmologic - Fundi not visualized due to noncooperation.  Cardiovascular - irregularly irregular heart rate and rhythm with bradycardia.  Mental Status -  Level of arousal and orientation to time, place, and person were intact. Language including expression, naming, repetition, comprehension was assessed and found intact. Attention span and concentration were normal. Fund of Knowledge was assessed and was intact.  Cranial Nerves II - XII - II - Visual field intact OU. III, IV, VI - Extraocular movements intact. V - Facial sensation intact bilaterally. VII - Facial movement intact bilaterally. VIII - Hearing & vestibular intact bilaterally. X - Palate elevates symmetrically, mild dysarthria. XI - Chin turning & shoulder shrug intact bilaterally. XII - Tongue protrusion intact.  Motor Strength - The patient's strength was normal in all  extremities and pronator drift was absent.  Bulk was normal and fasciculations were absent.   Motor Tone - Muscle tone was assessed at the neck and appendages and was normal.  Reflexes - The patient's reflexes were 1+ in all extremities and he had no pathological reflexes.  Sensory - Light touch, temperature/pinprick were assessed and were symmetrical.    Coordination - The patient had normal movements in the hands with no ataxia or dysmetria.  Tremor was absent.  Gait and Station - not tested   ASSESSMENT/PLAN [name] is a 82 y.o. male with history of hypertension, CAD s/p CABG, COPD on home O2, Bradycardia - possible AVB, and hypothyroidism presenting with unresponsiveness with subsequent right-sided weakness, dysarthria, and lethargy. TPA Saturday 09/11/2017 at 2030  Posterior TIA - embolic due to right VA acute occlusion. BA thrombus which was resolved. Right P2 occlusion.    Resultant  Right facial droop  CT head - No acute infarct.   MRI head - Negative for acute infarct  Cerebral Angiogram - right VA origin acute occlusion with partial distal reconstitution. Right ICA 75% stenosis. 7.91mm x 3.3 RT ICA cavernous aneyrysm,and/2.45mm x 2.82mm LT PCOM aneurysm   CTA H&N - 70% Rt ICA - Occluded Rt VA - acute basilar artery thrombus - Lt PCA thrombus - occluded Rt P2   2D Echo - pending  LDL - 46  HgbA1c - 5.4  VTE prophylaxis - SCDs  Diet NPO time specified  aspirin 81 mg daily prior to admission, now on No antithrombotic s/p TPA  Patient counseled to be compliant with his antithrombotic medications  Ongoing aggressive stroke risk factor management  Therapy recommendations:  pending  Disposition: Pending  New onset afib with bradycardia  Cardiology consult called  HR as low as 29 with long pause  TSH - 1.79  2D echo pending  Recommend anticoagulation once 24 h after tPA without bleeding  ? pacemaker  Severe extra- and intracranial stenosis  CTA  head and neck - 70% Rt ICA - Occluded Rt VA - acute basilar artery thrombus - Lt PCA thrombus - occluded Rt P2 - b/l siphon atherosclerosis  DSA - right VA origin acute occlusion with partial distal reconstitution. Right ICA 75% stenosis. 7.102mm x 3.3 RT ICA cavernous aneyrysm,and/2.41mm x 2.48mm LT PCOM aneurysm   Need ASA 81mg  on top of anticoagulation once 24h after tPA  Follow up with Dr. Estanislado Pandy as outpt  Right ICA stenosis  CTA and DSA showed 70% right ICA stenosis  Asymptomatic at this  time  Follow-up with Dr. Estanislado Pandy as outpt  Hypertension  Stable  Permissive hypertension (OK if < 180/105) but gradually normalize in 5-7 days  Long-term BP goal 130-150 due to severe vascular atherosclerosis  Hyperlipidemia  Home meds: Crestor 20 mg daily and Zetia 10 mg daily resumed in hospital  LDL 46, goal < 70  Continue medications at discharge  Other Stroke Risk Factors  Advanced age  Former smoker - quit in 2001  ETOH use, advised to drink no more than 1 - 2 drink(s) a day  Obesity, Body mass index is 32.13 kg/m., recommend weight loss, diet and exercise as appropriate   Coronary artery disease s/p 4V CABG  Other Active Problems  Bradycardia (hx of presumed AV block per Dr Pernell Dupre OV 09/07/14. Resolved off BB  COPD - Dr Melvyn Novas in past  CKD stage III -  BUN - 34 ; creatinine 2.20 -> recheck in a.m. following cerebral angiogram.  Follow-up with Dr Jaynee Eagles at [facility] in the past for dizziness   Hospital day # 1  This patient is critically ill due to basilar artery thrombosis, severe intra and extra cranial atherosclerosis, atrial fibrillation, bradycardia and at significant risk of neurological worsening, death form recurrent stroke, hemorrhagic conversion, heart failure, seizure, cardiac arrest. This patient's care requires constant monitoring of vital signs, hemodynamics, respiratory and cardiac monitoring, review of multiple databases, neurological assessment,  discussion with family, other specialists and medical decision making of high complexity. I spent 45 minutes of neurocritical care time in the care of this patient.  Rosalin Hawking, MD PhD Stroke Neurology 09/12/2017 6:06 PM   To contact Stroke Continuity provider, please refer to [email or web address] After hours, contact General Neurology

## 2017-09-12 NOTE — Progress Notes (Signed)
PT Cancellation Note  Patient Details Name: Derek [name] MRN: [id number] DOB: 31-Aug-1935   Cancelled Treatment:    Reason Eval/Treat Not Completed: Patient at procedure or test/unavailable;Patient not medically ready   Duncan Dull 09/12/2017, 9:41 AM Alben Deeds, PT DPT  Board Certified Neurologic Specialist [phone]

## 2017-09-12 NOTE — Sedation Documentation (Signed)
Dr. Estanislado Pandy assessed pts airway. Mallampait II.

## 2017-09-12 NOTE — Progress Notes (Signed)
Pt was taking to IR for procedure and I gave report to [facility] in [employer]

## 2017-09-12 NOTE — Sedation Documentation (Signed)
3 cm hematoma at R femoral artery puncture site. Manual pressure reapplied by Noah Delaine, RTR.

## 2017-09-12 NOTE — ED Provider Notes (Signed)
[name]   CSN: [id number] Arrival date & time: 09/11/17  1754     History   Chief Complaint Chief Complaint  Patient presents with  . Code Stroke    HPI Derek [name] is a 82 y.o. male.  HPI   82 yo M with h/o HTN, AAA, COPD, CAD s/p CABG, here with acute onset AMS. Pt was reportedly in usual state of health until around 530 PM. Pt was with his girlfriend at the time. According to report from EMS, pt was cooking at home when he subsequently became unresponsive. EMS was called and arrived ,noted pt to have R-sided weakness, confusion, not answering questions appropriately. Activated as CODE STROKE.   On arrival, pt drowsy, lethargic.  Level 5 caveat invoked as remainder of history, ROS, and physical exam limited due to patient's AMS, confusion, aphasia.   Past Medical History:  Diagnosis Date  . AAA (abdominal aortic aneurysm) ([place])   . Arthritis   . COPD (chronic obstructive pulmonary disease) ([place])   . Coronary artery disease   . Hypertension   . Hypothyroidism   . Myocardial infarction ([place])   . Shortness of breath    on exertion;  has productive cough at times  . Sleep apnea    states mild . no cpap    Patient Active Problem List   Diagnosis Date Noted  . 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 Chronic respiratory failure with hypoxia ([place]) 02/17/2017  . Morbid obesity ([place]) 04/04/2016  . Leg swelling 04/04/2016  . COPD GOLD II  04/02/2016  . AAA (abdominal aortic aneurysm) without rupture ([place]) 10/01/2014  . Coronary artery disease involving coronary bypass graft of native heart without angina pectoris 07/24/2014  . Dyspnea 07/24/2014  . Faintness 07/24/2014  . COLD (chronic obstructive lung disease) ([place]) 07/24/2014  . Bradycardia 07/24/2014  . Vertigo 07/24/2014  . Aorto-iliac disease ([place]) 09/28/2013  . Abdominal aneurysm without mention of rupture 09/08/2012    Past Surgical History:  Procedure  Laterality Date  . APPENDECTOMY  1963  . CATARACT EXTRACTION     w/ lid rise  . CORONARY ARTERY BYPASS GRAFT  2007  . EYE SURGERY Right March 2016   Cataract  . EYE SURGERY Bilateral May 2016   Eyebrow Lift  . EYE SURGERY Left June 2016   Cataract  . KNEE ARTHROSCOPY  02/26/2012   Procedure: ARTHROSCOPY KNEE;  Surgeon: Sydnee Cabal, MD;  Location: [facility];  Service: Orthopedics;  Laterality: Left;  medial menisectomy and chondraplasty       Home Medications    Prior to Admission medications   Medication Sig Start Date End Date Taking? Authorizing Provider  Acetaminophen (TYLENOL PO) Take 1-2 tablets by mouth every 6 (six) hours as needed (pain/headache).   Yes [provider]  aspirin EC 81 MG tablet Take 81 mg by mouth daily after supper.   Yes [provider]  Biotin 5000 MCG TABS Take 5,000 mcg by mouth daily.   Yes [provider]  budesonide-formoterol (SYMBICORT) 160-4.5 MCG/ACT inhaler Inhale 2 puffs into the lungs 2 (two) times daily. Patient taking differently: Inhale 2 puffs into the lungs 2 (two) times daily as needed (shortness of breath/wheezing).  08/02/17  Yes Tanda Rockers, MD  Choline Fenofibrate (TRILIPIX) 135 MG capsule Take 135 mg by mouth daily after supper.    Yes [provider]  ezetimibe (ZETIA) 10 MG tablet Take 10 mg by mouth  daily after supper.    Yes [provider]  famotidine (PEPCID) 20 MG tablet Take 20 mg by mouth daily as needed for heartburn or indigestion.    Yes [provider]  Febuxostat (ULORIC) 80 MG TABS Take 80 mg by mouth daily after supper.   Yes [provider]  furosemide (LASIX) 20 MG tablet One daily as needed for swelling Patient taking differently: Take 20 mg by mouth every other day.  10/27/16  Yes Tanda Rockers, MD  GLUCOSAMINE-CHONDROITIN PO Take 1 tablet by mouth 2 (two) times daily. 1500-1200   Yes [provider]  levothyroxine  (SYNTHROID, LEVOTHROID) 88 MCG tablet Take 88 mcg by mouth daily before breakfast.   Yes [provider]  Magnesium 250 MG TABS Take 250 mg by mouth daily.   Yes [provider]  MELATONIN PO Take 1 tablet by mouth at bedtime as needed (sleep).    Yes [provider]  Multiple Vitamin (MULTIVITAMIN WITH MINERALS) TABS tablet Take 1 tablet by mouth daily.   Yes [provider]  Omega-3 Fatty Acids (FISH OIL) 1200 MG CAPS Take 2,400 mg by mouth 2 (two) times daily.   Yes [provider]  OXYGEN Inhale 2 L into the lungs as needed (for  shortness of breath with exertion only).    Yes [provider]  pregabalin (LYRICA) 50 MG capsule Take 50 mg by mouth 2 (two) times daily.   Yes [provider]  rosuvastatin (CRESTOR) 20 MG tablet Take 20 mg by mouth daily after supper.    Yes [provider]  terazosin (HYTRIN) 5 MG capsule Take 10 mg by mouth 2 (two) times daily. 08/30/17  Yes [provider]  tiotropium (SPIRIVA) 18 MCG inhalation capsule Take 18 mcg by mouth daily.   Yes [provider]  Turmeric 500 MG CAPS Take 500 mg by mouth daily.   Yes [provider]  vitamin B-12 (CYANOCOBALAMIN) 1000 MCG tablet Take 1,000 mcg by mouth daily.   Yes [provider]  albuterol (PROAIR HFA) 108 (90 Base) MCG/ACT inhaler Inhale 2 puffs into the lungs every 6 (six) hours as needed for wheezing or shortness of breath. Patient not taking: Reported on 09/11/2017 04/28/16   Tanda Rockers, MD    Family History Family History  Problem Relation Age of Onset  . Diabetes Mother   . Heart disease Mother        Before age 9  . Diabetes Sister   . Hypertension Sister   . Diabetes Brother   . Hypertension Brother   . Hyperlipidemia Brother   . Hypertension Son   . Heart attack Daughter     Social History Social History  Substance Use Topics  . Smoking status: Former Smoker    Packs/day: 0.75     Years: 51.00    Types: Cigarettes    Quit date: 02/23/2000  . Smokeless tobacco: Never Used  . Alcohol use 0.0 - 0.6 oz/week     Comment: Moderate to little     Allergies   Latex; Ointment, white; and Tape   Review of Systems Review of Systems  Unable to perform ROS: Mental status change     Physical Exam Updated Vital Signs BP (!) 152/65   Pulse (!) 41   Temp 98 F (36.7 C)   Resp 14   Ht 5\' 9"  (1.753 m)   Wt 98.7 kg (217 lb 9.5 oz)   SpO2 91%   BMI  32.13 kg/m   Physical Exam  Constitutional: He appears well-developed and well-nourished. He appears distressed.  HENT:  Head: Normocephalic and atraumatic.  Eyes: Conjunctivae are normal.  Neck: Neck supple.  Cardiovascular: Normal rate, regular rhythm and normal heart sounds.  Exam reveals no friction rub.   No murmur heard. S/p CABG  Pulmonary/Chest: Effort normal. No respiratory distress. He has no wheezes. He has no rales.  Gasping respirations with transmitted upper airway sounds.  Abdominal: Soft. He exhibits no distension.  No appreciable pulsatile abd masses. No distension.  Musculoskeletal: He exhibits edema (trace b/l lower extremities).  Neurological:  Drowsy, but follows commands. Face appears grossly symmetric. Eyes conjugate on initial assessment. Pt mumbling, non-sensical speech. Strength 5/5 in b/l UE and LE with encouragement. Attention poor. Endorses normal sensation to light touch b/l UE and LE.  Skin: Skin is warm. Capillary refill takes less than 2 seconds.  Psychiatric: He has a normal mood and affect.  Nursing note and vitals reviewed.    ED Treatments / Results  Labs (all labs ordered are listed, but only abnormal results are displayed) Labs Reviewed  CBC - Abnormal; Notable for the following:       Result Value   RBC 4.11 (*)    Hemoglobin 12.8 (*)    HCT 38.8 (*)    Platelets 112 (*)    All other components within normal limits  DIFFERENTIAL - Abnormal; Notable for the following:      Basophils Absolute 0.2 (*)    All other components within normal limits  COMPREHENSIVE METABOLIC PANEL - Abnormal; Notable for the following:    BUN 33 (*)    Creatinine, Ser 2.16 (*)    Total Protein 6.0 (*)    Alkaline Phosphatase 28 (*)    GFR calc non Af Amer 27 (*)    GFR calc Af Amer 31 (*)    All other components within normal limits  I-STAT CHEM 8, ED - Abnormal; Notable for the following:    BUN 34 (*)    Creatinine, Ser 2.20 (*)    Calcium, Ion 1.14 (*)    Hemoglobin 12.6 (*)    HCT 37.0 (*)    All other components within normal limits  MRSA PCR SCREENING  PROTIME-INR  APTT  HEMOGLOBIN A1C  LIPID PANEL  TSH  I-STAT TROPONIN, ED  CBG MONITORING, ED  I-STAT CG4 LACTIC ACID, ED  CBG MONITORING, ED  I-STAT CG4 LACTIC ACID, ED    EKG  EKG Interpretation  Date/Time:  Saturday September 11 2017 18:16:35 EDT Ventricular Rate:  41 PR Interval:    QRS Duration: 101 QT Interval:  452 QTC Calculation: 374 R Axis:   101 Text Interpretation:  Right and left arm electrode reversal, interpretation assumes no reversal Atrial fibrillation with marked bradycardia Non-specific ST changes in limb leads Probable lateral infarct, old Confirmed by Duffy Bruce [phone]) on 09/12/2017 3:10:07 AM       Radiology Ct Angio Head W Or Wo Contrast  Result Date: 09/11/2017 CLINICAL DATA:  Stroke EXAM: CT ANGIOGRAPHY HEAD AND NECK CT PERFUSION BRAIN TECHNIQUE: Multidetector CT imaging of the head and neck was performed using the standard protocol during bolus administration of intravenous contrast. Multiplanar CT image reconstructions and MIPs were obtained to evaluate the vascular anatomy. Carotid stenosis measurements (when applicable) are obtained utilizing NASCET criteria, using the distal internal carotid diameter as the denominator. Multiphase CT imaging of the brain was performed following IV bolus contrast injection. Subsequent parametric perfusion  maps were calculated using RAPID  software. CONTRAST:  100 mL Isovue 370 IV COMPARISON:  CT head 09/11/2017 FINDINGS: CTA NECK FINDINGS Aortic arch: Moderate atherosclerotic calcification in the aortic arch without aneurysm. Proximal great vessels diffusely diseased with calcification but patent. Right carotid system: Right common carotid artery widely patent with minimal atherosclerotic disease. Heavily calcified plaque at the right carotid bifurcation. 70% diameter stenosis right internal carotid artery Left carotid system: Scattered atherosclerotic plaque in the left common carotid artery without significant stenosis. Atherosclerotic calcification in the left carotid bulb 50% diameter stenosis left internal carotid artery Vertebral arteries: Right vertebral dominant. Large calcified plaque right vertebral artery origin. Proximal and mid right vertebral artery occluded. There is reconstitution of the distal right vertebral artery with minimal opacification. The right vertebral artery is diffusely calcified distally. Small left vertebral artery with moderate stenosis proximally. Left vertebral artery ends in PICA and does not contribute to the basilar. Skeleton: No acute skeletal abnormality. Other neck: No mass lesion in the neck. Upper chest: Apical emphysema. Bilateral small pleural effusions. Apical scarring bilaterally. Review of the MIP images confirms the above findings CTA HEAD FINDINGS Anterior circulation: Atherosclerotic calcification throughout the right internal carotid artery through the skull base and cavernous segment with moderate stenosis. Hypoplastic right A1 segment. Right middle cerebral artery patent without stenosis Atherosclerotic calcification throughout the left internal carotid artery at the skullbase and throughout the left cavernous carotid with moderate stenosis. Both anterior cerebral arteries are supplied from the left and patent. Left middle cerebral artery patent without significant stenosis or occlusion. Posterior  circulation: Severe atherosclerotic distal right vertebral artery which shows faint opacification. The right vertebral artery is occluded in the proximal and mid segment. The basilar is diffusely diseased but patent. There is a filling defect in the distal basilar extending into the left P1. Left posterior cerebral artery is patent. Right posterior cerebral artery is patent in the P1 segment and then occludes due to thrombus in the right P2 segment. Small left vertebral artery ends in PICA Venous sinuses: Patent Anatomic variants: None Delayed phase: Not performed Review of the MIP images confirms the above findings CT Brain Perfusion Findings: CBF (<30%) Volume: 56mL Perfusion (Tmax>6.0s) volume: 147mL Mismatch Volume: 15mL Infarction Location:None Delayed perfusion is seen throughout the brainstem and right cerebellum. This could be artifactual. There is also delayed perfusion in the watershed territory of both cerebral hemispheres, right greater than left IMPRESSION: 70% diameter stenosis right internal carotid artery due to heavily calcified plaque. Moderate stenosis right cavernous carotid. 50% diameter stenosis left internal carotid artery due to heavily calcified plaque. Moderate stenosis left cavernous carotid Dominant right vertebral artery which is severely diseased and heavily calcified. The right vertebral artery is occluded in the proximal and midportion with faint reconstitution distally. The basilar is diffusely diseased. There is acute thrombus in the distal basilar. Left posterior cerebral artery is patent with thrombus overlying the left PCA origin. There is occlusion of the right P2 segment due to clot. Left vertebral artery is small and ends in PICA Abnormal CT perfusion with multiple areas of delayed perfusion due to severe atherosclerotic disease. No infarct on CT perfusion. These results were called by telephone at the time of interpretation on 09/11/2017 at 7:10 pm to Dr. Rory Percy, who verbally  acknowledged these results. Electronically Signed   By: Franchot Gallo M.D.   On: 09/11/2017 19:13   Dg Chest 2 View  Result Date: 09/11/2017 CLINICAL DATA:  Acute ischemic stroke. EXAM: CHEST  2  VIEW COMPARISON:  07/06/2016, 04/02/2016 FINDINGS: Sternotomy wires unchanged. Lungs are adequately inflated and demonstrate mild prominence of the perihilar markings likely vascular congestion/mild edema. Tiny amount of bilateral pleural fluid seen posteriorly. Several bilateral calcified granulomas unchanged. Mild cardiomegaly. Minimal calcified plaque over the thoracic aorta. Mild degenerate change of the spine. IMPRESSION: Mild cardiomegaly with mild vascular congestion/edema. Previous granulomas disease. Aortic Atherosclerosis (ICD10-I70.0). Electronically Signed   By: Marin Olp M.D.   On: 09/11/2017 20:45   Ct Angio Neck W Or Wo Contrast  Result Date: 09/11/2017 CLINICAL DATA:  Stroke EXAM: CT ANGIOGRAPHY HEAD AND NECK CT PERFUSION BRAIN TECHNIQUE: Multidetector CT imaging of the head and neck was performed using the standard protocol during bolus administration of intravenous contrast. Multiplanar CT image reconstructions and MIPs were obtained to evaluate the vascular anatomy. Carotid stenosis measurements (when applicable) are obtained utilizing NASCET criteria, using the distal internal carotid diameter as the denominator. Multiphase CT imaging of the brain was performed following IV bolus contrast injection. Subsequent parametric perfusion maps were calculated using RAPID software. CONTRAST:  100 mL Isovue 370 IV COMPARISON:  CT head 09/11/2017 FINDINGS: CTA NECK FINDINGS Aortic arch: Moderate atherosclerotic calcification in the aortic arch without aneurysm. Proximal great vessels diffusely diseased with calcification but patent. Right carotid system: Right common carotid artery widely patent with minimal atherosclerotic disease. Heavily calcified plaque at the right carotid bifurcation. 70% diameter  stenosis right internal carotid artery Left carotid system: Scattered atherosclerotic plaque in the left common carotid artery without significant stenosis. Atherosclerotic calcification in the left carotid bulb 50% diameter stenosis left internal carotid artery Vertebral arteries: Right vertebral dominant. Large calcified plaque right vertebral artery origin. Proximal and mid right vertebral artery occluded. There is reconstitution of the distal right vertebral artery with minimal opacification. The right vertebral artery is diffusely calcified distally. Small left vertebral artery with moderate stenosis proximally. Left vertebral artery ends in PICA and does not contribute to the basilar. Skeleton: No acute skeletal abnormality. Other neck: No mass lesion in the neck. Upper chest: Apical emphysema. Bilateral small pleural effusions. Apical scarring bilaterally. Review of the MIP images confirms the above findings CTA HEAD FINDINGS Anterior circulation: Atherosclerotic calcification throughout the right internal carotid artery through the skull base and cavernous segment with moderate stenosis. Hypoplastic right A1 segment. Right middle cerebral artery patent without stenosis Atherosclerotic calcification throughout the left internal carotid artery at the skullbase and throughout the left cavernous carotid with moderate stenosis. Both anterior cerebral arteries are supplied from the left and patent. Left middle cerebral artery patent without significant stenosis or occlusion. Posterior circulation: Severe atherosclerotic distal right vertebral artery which shows faint opacification. The right vertebral artery is occluded in the proximal and mid segment. The basilar is diffusely diseased but patent. There is a filling defect in the distal basilar extending into the left P1. Left posterior cerebral artery is patent. Right posterior cerebral artery is patent in the P1 segment and then occludes due to thrombus in the  right P2 segment. Small left vertebral artery ends in PICA Venous sinuses: Patent Anatomic variants: None Delayed phase: Not performed Review of the MIP images confirms the above findings CT Brain Perfusion Findings: CBF (<30%) Volume: 10mL Perfusion (Tmax>6.0s) volume: 134mL Mismatch Volume: 152mL Infarction Location:None Delayed perfusion is seen throughout the brainstem and right cerebellum. This could be artifactual. There is also delayed perfusion in the watershed territory of both cerebral hemispheres, right greater than left IMPRESSION: 70% diameter stenosis right internal carotid artery due to  heavily calcified plaque. Moderate stenosis right cavernous carotid. 50% diameter stenosis left internal carotid artery due to heavily calcified plaque. Moderate stenosis left cavernous carotid Dominant right vertebral artery which is severely diseased and heavily calcified. The right vertebral artery is occluded in the proximal and midportion with faint reconstitution distally. The basilar is diffusely diseased. There is acute thrombus in the distal basilar. Left posterior cerebral artery is patent with thrombus overlying the left PCA origin. There is occlusion of the right P2 segment due to clot. Left vertebral artery is small and ends in PICA Abnormal CT perfusion with multiple areas of delayed perfusion due to severe atherosclerotic disease. No infarct on CT perfusion. These results were called by telephone at the time of interpretation on 09/11/2017 at 7:10 pm to Dr. Rory Percy, who verbally acknowledged these results. Electronically Signed   By: Franchot Gallo M.D.   On: 09/11/2017 19:13   Ct Cerebral Perfusion W Contrast  Result Date: 09/11/2017 CLINICAL DATA:  Stroke EXAM: CT ANGIOGRAPHY HEAD AND NECK CT PERFUSION BRAIN TECHNIQUE: Multidetector CT imaging of the head and neck was performed using the standard protocol during bolus administration of intravenous contrast. Multiplanar CT image reconstructions and MIPs  were obtained to evaluate the vascular anatomy. Carotid stenosis measurements (when applicable) are obtained utilizing NASCET criteria, using the distal internal carotid diameter as the denominator. Multiphase CT imaging of the brain was performed following IV bolus contrast injection. Subsequent parametric perfusion maps were calculated using RAPID software. CONTRAST:  100 mL Isovue 370 IV COMPARISON:  CT head 09/11/2017 FINDINGS: CTA NECK FINDINGS Aortic arch: Moderate atherosclerotic calcification in the aortic arch without aneurysm. Proximal great vessels diffusely diseased with calcification but patent. Right carotid system: Right common carotid artery widely patent with minimal atherosclerotic disease. Heavily calcified plaque at the right carotid bifurcation. 70% diameter stenosis right internal carotid artery Left carotid system: Scattered atherosclerotic plaque in the left common carotid artery without significant stenosis. Atherosclerotic calcification in the left carotid bulb 50% diameter stenosis left internal carotid artery Vertebral arteries: Right vertebral dominant. Large calcified plaque right vertebral artery origin. Proximal and mid right vertebral artery occluded. There is reconstitution of the distal right vertebral artery with minimal opacification. The right vertebral artery is diffusely calcified distally. Small left vertebral artery with moderate stenosis proximally. Left vertebral artery ends in PICA and does not contribute to the basilar. Skeleton: No acute skeletal abnormality. Other neck: No mass lesion in the neck. Upper chest: Apical emphysema. Bilateral small pleural effusions. Apical scarring bilaterally. Review of the MIP images confirms the above findings CTA HEAD FINDINGS Anterior circulation: Atherosclerotic calcification throughout the right internal carotid artery through the skull base and cavernous segment with moderate stenosis. Hypoplastic right A1 segment. Right middle  cerebral artery patent without stenosis Atherosclerotic calcification throughout the left internal carotid artery at the skullbase and throughout the left cavernous carotid with moderate stenosis. Both anterior cerebral arteries are supplied from the left and patent. Left middle cerebral artery patent without significant stenosis or occlusion. Posterior circulation: Severe atherosclerotic distal right vertebral artery which shows faint opacification. The right vertebral artery is occluded in the proximal and mid segment. The basilar is diffusely diseased but patent. There is a filling defect in the distal basilar extending into the left P1. Left posterior cerebral artery is patent. Right posterior cerebral artery is patent in the P1 segment and then occludes due to thrombus in the right P2 segment. Small left vertebral artery ends in PICA Venous sinuses: Patent Anatomic variants:  None Delayed phase: Not performed Review of the MIP images confirms the above findings CT Brain Perfusion Findings: CBF (<30%) Volume: 46mL Perfusion (Tmax>6.0s) volume: 1100mL Mismatch Volume: 115mL Infarction Location:None Delayed perfusion is seen throughout the brainstem and right cerebellum. This could be artifactual. There is also delayed perfusion in the watershed territory of both cerebral hemispheres, right greater than left IMPRESSION: 70% diameter stenosis right internal carotid artery due to heavily calcified plaque. Moderate stenosis right cavernous carotid. 50% diameter stenosis left internal carotid artery due to heavily calcified plaque. Moderate stenosis left cavernous carotid Dominant right vertebral artery which is severely diseased and heavily calcified. The right vertebral artery is occluded in the proximal and midportion with faint reconstitution distally. The basilar is diffusely diseased. There is acute thrombus in the distal basilar. Left posterior cerebral artery is patent with thrombus overlying the left PCA origin.  There is occlusion of the right P2 segment due to clot. Left vertebral artery is small and ends in PICA Abnormal CT perfusion with multiple areas of delayed perfusion due to severe atherosclerotic disease. No infarct on CT perfusion. These results were called by telephone at the time of interpretation on 09/11/2017 at 7:10 pm to Dr. Rory Percy, who verbally acknowledged these results. Electronically Signed   By: Franchot Gallo M.D.   On: 09/11/2017 19:13   Mr Brain Limited Wo Contrast  Result Date: 09/11/2017 CLINICAL DATA:  Basilar occlusion.  Stroke. EXAM: MRI HEAD WITHOUT CONTRAST TECHNIQUE: Multiplanar, multiecho pulse sequences of the brain and surrounding structures were obtained without intravenous contrast. COMPARISON:  CT and CT perfusion 09/11/2017 FINDINGS: Brain: Limited sequences. Diffusion and axial FLAIR imaging only was performed. Negative for acute infarct. Generalized atrophy. Minimal chronic ischemic change in the white matter. IMPRESSION: Negative for acute infarct on limited imaging. These results were called by telephone at the time of interpretation on 09/11/2017 at 7:28 pm to Dr. Amie Portland , who verbally acknowledged these results. Electronically Signed   By: Franchot Gallo M.D.   On: 09/11/2017 19:28   Ct Head Code Stroke Wo Contrast  Result Date: 09/11/2017 CLINICAL DATA:  Code stroke. Focal neuro deficit less than 6 hours. Right-sided weakness EXAM: CT HEAD WITHOUT CONTRAST TECHNIQUE: Contiguous axial images were obtained from the base of the skull through the vertex without intravenous contrast. COMPARISON:  MRI 01/17/2015 FINDINGS: Brain: Generalized atrophy. Negative for hydrocephalus. Negative for acute infarct, hemorrhage, mass lesion. Vascular: Extensive calcification in the right vertebral artery and basilar. Calcification also in the cavernous carotid bilaterally. Hyperdensity distal basilar suggestive of acute thrombosis. Skull: Negative Sinuses/Orbits: Mild mucosal edema  paranasal sinuses. Bilateral cataract removal. Other: None ASPECTS ([place] Stroke Program Early CT Score) - Ganglionic level infarction (caudate, lentiform nuclei, internal capsule, insula, M1-M3 cortex): 7 - Supraganglionic infarction (M4-M6 cortex): 3 Total score (0-10 with 10 being normal): 10 IMPRESSION: 1. No acute infarct. 2. Hyperdense distal basilar suggestive of acute thrombosis. 3. ASPECTS is 10 4. These results were called by telephone at the time of interpretation on 09/11/2017 at 6:18 pm to Dr. Rory Percy, who verbally acknowledged these results. Electronically Signed   By: Franchot Gallo M.D.   On: 09/11/2017 18:21    Procedures .Critical Care Performed by: Duffy Bruce Authorized by: Duffy Bruce   Critical care provider statement:    Critical care time (minutes):  55   Critical care time was exclusive of:  Separately billable procedures and treating other patients and teaching time   Critical care was necessary to treat or prevent  imminent or life-threatening deterioration of the following conditions:  CNS failure or compromise   Critical care was time spent personally by me on the following activities:  Development of treatment plan with patient or surrogate, discussions with consultants, evaluation of patient's response to treatment, examination of patient, obtaining history from patient or surrogate, ordering and performing treatments and interventions, ordering and review of laboratory studies, ordering and review of radiographic studies, pulse oximetry, re-evaluation of patient's condition and review of old charts   I assumed direction of critical care for this patient from another provider in my specialty: no      (including critical care time)  Medications Ordered in ED Medications  alteplase (ACTIVASE) 1 mg/mL infusion 90 mg (0 mg Intravenous Stopped 09/11/17 1945)    Followed by  0.9 %  sodium chloride infusion (not administered)  nicardipine (CARDENE) 20mg  in 0.86%  saline 223ml IV infusion (0.1 mg/ml) (0 mg/hr Intravenous Hold 09/11/17 1957)   stroke: mapping our early stages of recovery book (not administered)  0.9 %  sodium chloride infusion ( Intravenous New Bag/Given 09/11/17 2157)  acetaminophen (TYLENOL) tablet 650 mg (650 mg Oral Given 09/11/17 2347)    Or  acetaminophen (TYLENOL) solution 650 mg ( Per Tube See Alternative 09/11/17 2347)    Or  acetaminophen (TYLENOL) suppository 650 mg ( Rectal See Alternative 09/11/17 2347)  senna-docusate (Senokot-S) tablet 1 tablet (not administered)  iopamidol (ISOVUE-370) 76 % injection (  Contrast Given 09/11/17 1830)     Initial Impression / Assessment and Plan / ED Course  I have reviewed the triage vital signs and the nursing notes.  Pertinent labs & imaging results that were available during my care of the patient were reviewed by me and considered in my medical decision making (see chart for details).     82 yo M with CAD s/p CABG here with acute encephalopathy/AMS. Initially, pt's focal neuro deficits had resolved - taken to CT scanner which shows possible basilar artery occlusion. On return to resusc room, pt developed recurrence of neuro deficits - concern for ongoing sx 2/2 basilar occlusion. Pt started on IV tPA. We had a long discussion with pt, significant other, and son re: further imaging and work-up. Neurosurgery consulted but while debating IR, pt had improvement in sx. Suspect this is 2/2 unstable but now non-occlusive basilar artery clot. Pt will be continued on tPA, admitted to Neuro ICU. He is protecting his airway at this time and is DNR.  Final Clinical Impressions(s) / ED Diagnoses   Final diagnoses:  Acute ischemic stroke [facility])  Stroke (cerebrum) [facility])    New Prescriptions Current Discharge Medication List       Duffy Bruce, MD 09/12/17 1110

## 2017-09-12 NOTE — Progress Notes (Signed)
Pt returned from IR and rt groin site checked. Level 0 in right groin. + pulses in bilateral lower extremities

## 2017-09-12 NOTE — Sedation Documentation (Signed)
5 Fr sheath removed from R femoral artery by Noah Delaine, RTR. Hemostasis achieved using manual pressure. Groin level 0, 2+RDP, 2+RPT.

## 2017-09-12 NOTE — Procedures (Signed)
S/P bilateral common carotid artery and RT subclavian arteriograms. RT CFA approach. Findings. 1 Dominant RT VA origin recent  occlusion  over underlying stenosis with partial distal distal reconstituition of RT VA at C1. 2. Approx 75 % stenosis of RT ICA prox . 3.7.51mm x 3.3 RT ICA cavernous aneyrysm,and/2.48mm x 2.49mm LT PCOM aneurysm

## 2017-09-12 NOTE — Sedation Documentation (Signed)
R groin level 0 post manual pressure. 2+RDP/2+RPT. Drsg CDI.

## 2017-09-12 NOTE — Progress Notes (Signed)
ANTICOAGULATION CONSULT NOTE - Initial Consult  Pharmacy Consult for Apixaban Indication: atrial fibrillation  Allergies  Allergen Reactions  . Latex Rash  . Ointment, White Rash    Mycins ointment and pills  . Tape Rash    Reaction to adhesive tape - pls use paper tape    Patient Measurements: Height: 5\' 9"  (175.3 cm) Weight: 217 lb 9.5 oz (98.7 kg) IBW/kg (Calculated) : 70.7  Vital Signs: Temp: 97.6 F (36.4 C) (09/23 1554) Temp Source: Oral (09/23 1554) BP: 153/63 (09/23 1500) Pulse Rate: 38 (09/23 1500)  Labs:  Recent Labs  09/11/17 1755 09/11/17 1801  HGB 12.8* 12.6*  HCT 38.8* 37.0*  PLT 112*  --   APTT 30  --   LABPROT 13.5  --   INR 1.04  --   CREATININE 2.16* 2.20*    Estimated Creatinine Clearance: 31 mL/min (A) (by C-G formula based on SCr of 2.2 mg/dL (H)).   Medical History: Past Medical History:  Diagnosis Date  . AAA (abdominal aortic aneurysm) ([place])   . Arthritis   . COPD (chronic obstructive pulmonary disease) ([place])   . Coronary artery disease   . Hypertension   . Hypothyroidism   . Myocardial infarction ([place])   . Shortness of breath    on exertion;  has productive cough at times  . Sleep apnea    states mild . no cpap    Assessment: 82 yo male admitted 09/22 with posterior TIA that received TPA on 09/11/17 at 18:45 followed by interventional radiology procedure that is to start apixaban 24 hours post TPA for atrial fibrillation per neurology. MRI on 09/23 w/o evidence of bleed of hemorrhage. Age 86 and SCr 2.2.    Goal of Therapy:  Monitor platelets by anticoagulation protocol: Yes   Plan:  1. Apixaban 2.5 mg BID to begin at 23:00 this evening ( > 24 hours post TPA)  Vincenza Hews, PharmD, BCPS 09/12/2017, 6:26 PM

## 2017-09-12 NOTE — Progress Notes (Signed)
Progress Note  Patient Name: Derek [name] Date of Encounter: 09/12/2017  Primary Cardiologist: Tamala Julian   Subjective   82 YO man with history of CAD s/p 4V CABG, HTN, hypothyroid, OSA, COPD, AAA presenting with a basilar artery stroke s/p IV TPA.  As his neuro exam has improved, no plans for neurointervention.  Post TPA, he has had atrial fibrillation (seems to be a new diagnosis) with slow rates (40s).  Per neurology, antiplatelets should be held for 24 hours post TPA.     We were asked to see him for further management of his A fib .   Inpatient Medications    Scheduled Meds: . ezetimibe  10 mg Oral Daily  . famotidine  20 mg Oral Daily  . [START ON 09/13/2017] Influenza vac split quadrivalent PF  0.5 mL Intramuscular Tomorrow-1000  . [START ON 09/13/2017] levothyroxine  88 mcg Oral QAC breakfast  . lidocaine      . pregabalin  50 mg Oral BID  . rosuvastatin  20 mg Oral q1800   Continuous Infusions: . sodium chloride    . sodium chloride 75 mL/hr at 09/12/17 1045  . sodium chloride    . niCARDipine Stopped (09/11/17 1957)   PRN Meds: acetaminophen **OR** acetaminophen (TYLENOL) oral liquid 160 mg/5 mL **OR** acetaminophen, senna-docusate   Vital Signs    Vitals:   09/12/17 1045 09/12/17 1100 09/12/17 1150 09/12/17 1200  BP: (!) 156/70 (!) 170/66  (!) 154/62  Pulse: (!) 39 (!) 41  (!) 41  Resp: 17 17  18   Temp:   (!) 97.2 F (36.2 C)   TempSrc:   Axillary   SpO2: 95% 95%  98%  Weight:      Height:        Intake/Output Summary (Last 24 hours) at 09/12/17 1245 Last data filed at 09/12/17 1000  Gross per 24 hour  Intake           843.75 ml  Output             2165 ml  Net         -1321.25 ml   Filed Weights   09/11/17 2151  Weight: 217 lb 9.5 oz (98.7 kg)    Telemetry    Atrial fib with HR 48 - Personally Reviewed  ECG     Atrial fib  - Personally Reviewed  Physical Exam   GEN: No acute distress.  Seems sleepy  Neck: No JVD Cardiac: irreg.  Irreg.   HR is slow  Respiratory: Clear to auscultation bilaterally. GI: Soft, nontender, non-distended  MS: No edema; No deformity. Neuro:  Nonfocal  Psych: Normal affect   Labs    Chemistry Recent Labs Lab 09/11/17 1755 09/11/17 1801  NA 138 140  K 4.1 4.1  CL 107 104  CO2 24  --   GLUCOSE 93 91  BUN 33* 34*  CREATININE 2.16* 2.20*  CALCIUM 9.1  --   PROT 6.0*  --   ALBUMIN 3.7  --   AST 29  --   ALT 18  --   ALKPHOS 28*  --   BILITOT 0.7  --   GFRNONAA 27*  --   GFRAA 31*  --   ANIONGAP 7  --      Hematology Recent Labs Lab 09/11/17 1755 09/11/17 1801  WBC 5.1  --   RBC 4.11*  --   HGB 12.8* 12.6*  HCT 38.8* 37.0*  MCV 94.4  --   MCH  31.1  --   MCHC 33.0  --   RDW 14.9  --   PLT 112*  --     Cardiac EnzymesNo results for input(s): TROPONINI in the last 168 hours.  Recent Labs Lab 09/11/17 1759  TROPIPOC 0.02     BNPNo results for input(s): BNP, PROBNP in the last 168 hours.   DDimer No results for input(s): DDIMER in the last 168 hours.   Radiology    Ct Angio Head W Or Wo Contrast  Result Date: 09/11/2017 CLINICAL DATA:  Stroke EXAM: CT ANGIOGRAPHY HEAD AND NECK CT PERFUSION BRAIN TECHNIQUE: Multidetector CT imaging of the head and neck was performed using the standard protocol during bolus administration of intravenous contrast. Multiplanar CT image reconstructions and MIPs were obtained to evaluate the vascular anatomy. Carotid stenosis measurements (when applicable) are obtained utilizing NASCET criteria, using the distal internal carotid diameter as the denominator. Multiphase CT imaging of the brain was performed following IV bolus contrast injection. Subsequent parametric perfusion maps were calculated using RAPID software. CONTRAST:  100 mL Isovue 370 IV COMPARISON:  CT head 09/11/2017 FINDINGS: CTA NECK FINDINGS Aortic arch: Moderate atherosclerotic calcification in the aortic arch without aneurysm. Proximal great vessels diffusely diseased  with calcification but patent. Right carotid system: Right common carotid artery widely patent with minimal atherosclerotic disease. Heavily calcified plaque at the right carotid bifurcation. 70% diameter stenosis right internal carotid artery Left carotid system: Scattered atherosclerotic plaque in the left common carotid artery without significant stenosis. Atherosclerotic calcification in the left carotid bulb 50% diameter stenosis left internal carotid artery Vertebral arteries: Right vertebral dominant. Large calcified plaque right vertebral artery origin. Proximal and mid right vertebral artery occluded. There is reconstitution of the distal right vertebral artery with minimal opacification. The right vertebral artery is diffusely calcified distally. Small left vertebral artery with moderate stenosis proximally. Left vertebral artery ends in PICA and does not contribute to the basilar. Skeleton: No acute skeletal abnormality. Other neck: No mass lesion in the neck. Upper chest: Apical emphysema. Bilateral small pleural effusions. Apical scarring bilaterally. Review of the MIP images confirms the above findings CTA HEAD FINDINGS Anterior circulation: Atherosclerotic calcification throughout the right internal carotid artery through the skull base and cavernous segment with moderate stenosis. Hypoplastic right A1 segment. Right middle cerebral artery patent without stenosis Atherosclerotic calcification throughout the left internal carotid artery at the skullbase and throughout the left cavernous carotid with moderate stenosis. Both anterior cerebral arteries are supplied from the left and patent. Left middle cerebral artery patent without significant stenosis or occlusion. Posterior circulation: Severe atherosclerotic distal right vertebral artery which shows faint opacification. The right vertebral artery is occluded in the proximal and mid segment. The basilar is diffusely diseased but patent. There is a  filling defect in the distal basilar extending into the left P1. Left posterior cerebral artery is patent. Right posterior cerebral artery is patent in the P1 segment and then occludes due to thrombus in the right P2 segment. Small left vertebral artery ends in PICA Venous sinuses: Patent Anatomic variants: None Delayed phase: Not performed Review of the MIP images confirms the above findings CT Brain Perfusion Findings: CBF (<30%) Volume: 44mL Perfusion (Tmax>6.0s) volume: 154mL Mismatch Volume: 168mL Infarction Location:None Delayed perfusion is seen throughout the brainstem and right cerebellum. This could be artifactual. There is also delayed perfusion in the watershed territory of both cerebral hemispheres, right greater than left IMPRESSION: 70% diameter stenosis right internal carotid artery due to heavily calcified plaque.  Moderate stenosis right cavernous carotid. 50% diameter stenosis left internal carotid artery due to heavily calcified plaque. Moderate stenosis left cavernous carotid Dominant right vertebral artery which is severely diseased and heavily calcified. The right vertebral artery is occluded in the proximal and midportion with faint reconstitution distally. The basilar is diffusely diseased. There is acute thrombus in the distal basilar. Left posterior cerebral artery is patent with thrombus overlying the left PCA origin. There is occlusion of the right P2 segment due to clot. Left vertebral artery is small and ends in PICA Abnormal CT perfusion with multiple areas of delayed perfusion due to severe atherosclerotic disease. No infarct on CT perfusion. These results were called by telephone at the time of interpretation on 09/11/2017 at 7:10 pm to Dr. Rory Percy, who verbally acknowledged these results. Electronically Signed   By: Franchot Gallo M.D.   On: 09/11/2017 19:13   Dg Chest 2 View  Result Date: 09/11/2017 CLINICAL DATA:  Acute ischemic stroke. EXAM: CHEST  2 VIEW COMPARISON:  07/06/2016,  04/02/2016 FINDINGS: Sternotomy wires unchanged. Lungs are adequately inflated and demonstrate mild prominence of the perihilar markings likely vascular congestion/mild edema. Tiny amount of bilateral pleural fluid seen posteriorly. Several bilateral calcified granulomas unchanged. Mild cardiomegaly. Minimal calcified plaque over the thoracic aorta. Mild degenerate change of the spine. IMPRESSION: Mild cardiomegaly with mild vascular congestion/edema. Previous granulomas disease. Aortic Atherosclerosis (ICD10-I70.0). Electronically Signed   By: Marin Olp M.D.   On: 09/11/2017 20:45   Ct Angio Neck W Or Wo Contrast  Result Date: 09/11/2017 CLINICAL DATA:  Stroke EXAM: CT ANGIOGRAPHY HEAD AND NECK CT PERFUSION BRAIN TECHNIQUE: Multidetector CT imaging of the head and neck was performed using the standard protocol during bolus administration of intravenous contrast. Multiplanar CT image reconstructions and MIPs were obtained to evaluate the vascular anatomy. Carotid stenosis measurements (when applicable) are obtained utilizing NASCET criteria, using the distal internal carotid diameter as the denominator. Multiphase CT imaging of the brain was performed following IV bolus contrast injection. Subsequent parametric perfusion maps were calculated using RAPID software. CONTRAST:  100 mL Isovue 370 IV COMPARISON:  CT head 09/11/2017 FINDINGS: CTA NECK FINDINGS Aortic arch: Moderate atherosclerotic calcification in the aortic arch without aneurysm. Proximal great vessels diffusely diseased with calcification but patent. Right carotid system: Right common carotid artery widely patent with minimal atherosclerotic disease. Heavily calcified plaque at the right carotid bifurcation. 70% diameter stenosis right internal carotid artery Left carotid system: Scattered atherosclerotic plaque in the left common carotid artery without significant stenosis. Atherosclerotic calcification in the left carotid bulb 50% diameter  stenosis left internal carotid artery Vertebral arteries: Right vertebral dominant. Large calcified plaque right vertebral artery origin. Proximal and mid right vertebral artery occluded. There is reconstitution of the distal right vertebral artery with minimal opacification. The right vertebral artery is diffusely calcified distally. Small left vertebral artery with moderate stenosis proximally. Left vertebral artery ends in PICA and does not contribute to the basilar. Skeleton: No acute skeletal abnormality. Other neck: No mass lesion in the neck. Upper chest: Apical emphysema. Bilateral small pleural effusions. Apical scarring bilaterally. Review of the MIP images confirms the above findings CTA HEAD FINDINGS Anterior circulation: Atherosclerotic calcification throughout the right internal carotid artery through the skull base and cavernous segment with moderate stenosis. Hypoplastic right A1 segment. Right middle cerebral artery patent without stenosis Atherosclerotic calcification throughout the left internal carotid artery at the skullbase and throughout the left cavernous carotid with moderate stenosis. Both anterior cerebral arteries are supplied  from the left and patent. Left middle cerebral artery patent without significant stenosis or occlusion. Posterior circulation: Severe atherosclerotic distal right vertebral artery which shows faint opacification. The right vertebral artery is occluded in the proximal and mid segment. The basilar is diffusely diseased but patent. There is a filling defect in the distal basilar extending into the left P1. Left posterior cerebral artery is patent. Right posterior cerebral artery is patent in the P1 segment and then occludes due to thrombus in the right P2 segment. Small left vertebral artery ends in PICA Venous sinuses: Patent Anatomic variants: None Delayed phase: Not performed Review of the MIP images confirms the above findings CT Brain Perfusion Findings: CBF  (<30%) Volume: 43mL Perfusion (Tmax>6.0s) volume: 139mL Mismatch Volume: 184mL Infarction Location:None Delayed perfusion is seen throughout the brainstem and right cerebellum. This could be artifactual. There is also delayed perfusion in the watershed territory of both cerebral hemispheres, right greater than left IMPRESSION: 70% diameter stenosis right internal carotid artery due to heavily calcified plaque. Moderate stenosis right cavernous carotid. 50% diameter stenosis left internal carotid artery due to heavily calcified plaque. Moderate stenosis left cavernous carotid Dominant right vertebral artery which is severely diseased and heavily calcified. The right vertebral artery is occluded in the proximal and midportion with faint reconstitution distally. The basilar is diffusely diseased. There is acute thrombus in the distal basilar. Left posterior cerebral artery is patent with thrombus overlying the left PCA origin. There is occlusion of the right P2 segment due to clot. Left vertebral artery is small and ends in PICA Abnormal CT perfusion with multiple areas of delayed perfusion due to severe atherosclerotic disease. No infarct on CT perfusion. These results were called by telephone at the time of interpretation on 09/11/2017 at 7:10 pm to Dr. Rory Percy, who verbally acknowledged these results. Electronically Signed   By: Franchot Gallo M.D.   On: 09/11/2017 19:13   Ct Cerebral Perfusion W Contrast  Result Date: 09/11/2017 CLINICAL DATA:  Stroke EXAM: CT ANGIOGRAPHY HEAD AND NECK CT PERFUSION BRAIN TECHNIQUE: Multidetector CT imaging of the head and neck was performed using the standard protocol during bolus administration of intravenous contrast. Multiplanar CT image reconstructions and MIPs were obtained to evaluate the vascular anatomy. Carotid stenosis measurements (when applicable) are obtained utilizing NASCET criteria, using the distal internal carotid diameter as the denominator. Multiphase CT imaging  of the brain was performed following IV bolus contrast injection. Subsequent parametric perfusion maps were calculated using RAPID software. CONTRAST:  100 mL Isovue 370 IV COMPARISON:  CT head 09/11/2017 FINDINGS: CTA NECK FINDINGS Aortic arch: Moderate atherosclerotic calcification in the aortic arch without aneurysm. Proximal great vessels diffusely diseased with calcification but patent. Right carotid system: Right common carotid artery widely patent with minimal atherosclerotic disease. Heavily calcified plaque at the right carotid bifurcation. 70% diameter stenosis right internal carotid artery Left carotid system: Scattered atherosclerotic plaque in the left common carotid artery without significant stenosis. Atherosclerotic calcification in the left carotid bulb 50% diameter stenosis left internal carotid artery Vertebral arteries: Right vertebral dominant. Large calcified plaque right vertebral artery origin. Proximal and mid right vertebral artery occluded. There is reconstitution of the distal right vertebral artery with minimal opacification. The right vertebral artery is diffusely calcified distally. Small left vertebral artery with moderate stenosis proximally. Left vertebral artery ends in PICA and does not contribute to the basilar. Skeleton: No acute skeletal abnormality. Other neck: No mass lesion in the neck. Upper chest: Apical emphysema. Bilateral small pleural effusions.  Apical scarring bilaterally. Review of the MIP images confirms the above findings CTA HEAD FINDINGS Anterior circulation: Atherosclerotic calcification throughout the right internal carotid artery through the skull base and cavernous segment with moderate stenosis. Hypoplastic right A1 segment. Right middle cerebral artery patent without stenosis Atherosclerotic calcification throughout the left internal carotid artery at the skullbase and throughout the left cavernous carotid with moderate stenosis. Both anterior cerebral  arteries are supplied from the left and patent. Left middle cerebral artery patent without significant stenosis or occlusion. Posterior circulation: Severe atherosclerotic distal right vertebral artery which shows faint opacification. The right vertebral artery is occluded in the proximal and mid segment. The basilar is diffusely diseased but patent. There is a filling defect in the distal basilar extending into the left P1. Left posterior cerebral artery is patent. Right posterior cerebral artery is patent in the P1 segment and then occludes due to thrombus in the right P2 segment. Small left vertebral artery ends in PICA Venous sinuses: Patent Anatomic variants: None Delayed phase: Not performed Review of the MIP images confirms the above findings CT Brain Perfusion Findings: CBF (<30%) Volume: 72mL Perfusion (Tmax>6.0s) volume: 154mL Mismatch Volume: 143mL Infarction Location:None Delayed perfusion is seen throughout the brainstem and right cerebellum. This could be artifactual. There is also delayed perfusion in the watershed territory of both cerebral hemispheres, right greater than left IMPRESSION: 70% diameter stenosis right internal carotid artery due to heavily calcified plaque. Moderate stenosis right cavernous carotid. 50% diameter stenosis left internal carotid artery due to heavily calcified plaque. Moderate stenosis left cavernous carotid Dominant right vertebral artery which is severely diseased and heavily calcified. The right vertebral artery is occluded in the proximal and midportion with faint reconstitution distally. The basilar is diffusely diseased. There is acute thrombus in the distal basilar. Left posterior cerebral artery is patent with thrombus overlying the left PCA origin. There is occlusion of the right P2 segment due to clot. Left vertebral artery is small and ends in PICA Abnormal CT perfusion with multiple areas of delayed perfusion due to severe atherosclerotic disease. No infarct on  CT perfusion. These results were called by telephone at the time of interpretation on 09/11/2017 at 7:10 pm to Dr. Rory Percy, who verbally acknowledged these results. Electronically Signed   By: Franchot Gallo M.D.   On: 09/11/2017 19:13   Mr Brain Limited Wo Contrast  Result Date: 09/11/2017 CLINICAL DATA:  Basilar occlusion.  Stroke. EXAM: MRI HEAD WITHOUT CONTRAST TECHNIQUE: Multiplanar, multiecho pulse sequences of the brain and surrounding structures were obtained without intravenous contrast. COMPARISON:  CT and CT perfusion 09/11/2017 FINDINGS: Brain: Limited sequences. Diffusion and axial FLAIR imaging only was performed. Negative for acute infarct. Generalized atrophy. Minimal chronic ischemic change in the white matter. IMPRESSION: Negative for acute infarct on limited imaging. These results were called by telephone at the time of interpretation on 09/11/2017 at 7:28 pm to Dr. Amie Portland , who verbally acknowledged these results. Electronically Signed   By: Franchot Gallo M.D.   On: 09/11/2017 19:28   Ct Head Code Stroke Wo Contrast  Result Date: 09/11/2017 CLINICAL DATA:  Code stroke. Focal neuro deficit less than 6 hours. Right-sided weakness EXAM: CT HEAD WITHOUT CONTRAST TECHNIQUE: Contiguous axial images were obtained from the base of the skull through the vertex without intravenous contrast. COMPARISON:  MRI 01/17/2015 FINDINGS: Brain: Generalized atrophy. Negative for hydrocephalus. Negative for acute infarct, hemorrhage, mass lesion. Vascular: Extensive calcification in the right vertebral artery and basilar. Calcification also in the  cavernous carotid bilaterally. Hyperdensity distal basilar suggestive of acute thrombosis. Skull: Negative Sinuses/Orbits: Mild mucosal edema paranasal sinuses. Bilateral cataract removal. Other: None ASPECTS ([place] Stroke Program Early CT Score) - Ganglionic level infarction (caudate, lentiform nuclei, internal capsule, insula, M1-M3 cortex): 7 -  Supraganglionic infarction (M4-M6 cortex): 3 Total score (0-10 with 10 being normal): 10 IMPRESSION: 1. No acute infarct. 2. Hyperdense distal basilar suggestive of acute thrombosis. 3. ASPECTS is 10 4. These results were called by telephone at the time of interpretation on 09/11/2017 at 6:18 pm to Dr. Rory Percy, who verbally acknowledged these results. Electronically Signed   By: Franchot Gallo M.D.   On: 09/11/2017 18:21    Cardiac Studies     Patient Profile     82 y.o. male  With CAD, CABG, admitted with a stroke. Found to have new Dx of atrial fib   Assessment & Plan    1.  Atrial fib:   CHADS2VASC = 6 ( age 64, CAD, CVA, HTN)  He will need to be on chronic anticoagulation . Will defer to neuro as to the timing of initiating the [place].  HR is slow but [place] is stable   2. CAD:  No angina :   For questions or updates, please contact [place] Please consult [email or web address] for contact info under Cardiology/STEMI.      Signed, Mertie Moores, MD  09/12/2017, 12:45 PM

## 2017-09-12 NOTE — Sedation Documentation (Addendum)
250cc NS Bolus per VO Dr. Kathi Ludwig for elevated renal function.

## 2017-09-12 NOTE — Sedation Documentation (Signed)
Gauze/tegaderm bandage applied to R fem artery puncture. Groin level 0, 2+RDP 2+RPT. Drsg CDI.

## 2017-09-13 ENCOUNTER — Encounter (HOSPITAL_COMMUNITY): Payer: Self-pay | Admitting: General Practice

## 2017-09-13 ENCOUNTER — Inpatient Hospital Stay (HOSPITAL_COMMUNITY): Payer: Medicare Other

## 2017-09-13 DIAGNOSIS — I5033 Acute on chronic diastolic (congestive) heart failure: Secondary | ICD-10-CM

## 2017-09-13 DIAGNOSIS — I639 Cerebral infarction, unspecified: Secondary | ICD-10-CM

## 2017-09-13 DIAGNOSIS — I25810 Atherosclerosis of coronary artery bypass graft(s) without angina pectoris: Secondary | ICD-10-CM

## 2017-09-13 DIAGNOSIS — G450 Vertebro-basilar artery syndrome: Secondary | ICD-10-CM

## 2017-09-13 DIAGNOSIS — I6521 Occlusion and stenosis of right carotid artery: Secondary | ICD-10-CM

## 2017-09-13 DIAGNOSIS — R001 Bradycardia, unspecified: Secondary | ICD-10-CM

## 2017-09-13 LAB — ECHOCARDIOGRAM COMPLETE
Height: 69 in
WEIGHTICAEL: 3481.5 [oz_av]

## 2017-09-13 LAB — BASIC METABOLIC PANEL
ANION GAP: 6 (ref 5–15)
BUN: 26 mg/dL — ABNORMAL HIGH (ref 6–20)
CALCIUM: 8.1 mg/dL — AB (ref 8.9–10.3)
CHLORIDE: 109 mmol/L (ref 101–111)
CO2: 22 mmol/L (ref 22–32)
Creatinine, Ser: 1.89 mg/dL — ABNORMAL HIGH (ref 0.61–1.24)
GFR calc non Af Amer: 32 mL/min — ABNORMAL LOW (ref >60)
GFR, EST AFRICAN AMERICAN: 37 mL/min — AB (ref >60)
GLUCOSE: 93 mg/dL (ref 65–99)
POTASSIUM: 3.9 mmol/L (ref 3.5–5.1)
Sodium: 137 mmol/L (ref 135–145)

## 2017-09-13 LAB — CBC
HEMATOCRIT: 36.1 % — AB (ref 39.0–52.0)
HEMOGLOBIN: 11.7 g/dL — AB (ref 13.0–17.0)
MCH: 30.8 pg (ref 26.0–34.0)
MCHC: 32.4 g/dL (ref 30.0–36.0)
MCV: 95 fL (ref 78.0–100.0)
Platelets: 99 10*3/uL — ABNORMAL LOW (ref 150–400)
RBC: 3.8 MIL/uL — AB (ref 4.22–5.81)
RDW: 14.8 % (ref 11.5–15.5)
WBC: 5.2 10*3/uL (ref 4.0–10.5)

## 2017-09-13 MED ORDER — APIXABAN 2.5 MG PO TABS: 2.5000 mg | ORAL_TABLET | Freq: Two times a day (BID) | ORAL | 5 refills | Status: DC

## 2017-09-13 MED ORDER — HYDRALAZINE HCL 20 MG/ML IJ SOLN: 5.0000 mg | INTRAMUSCULAR | Status: DC | PRN

## 2017-09-13 NOTE — Consult Note (Signed)
ELECTROPHYSIOLOGY CONSULT NOTE  Patient ID: Derek [name], MRN: [id number], DOB/AGE: 80-20-1936 80 y.o. Admit date: 09/11/2017 Date of Consult: 09/13/2017  Primary Physician: Merrilee Seashore, MD Primary Cardiologist: [facility] Derek [name] is a 82 y.o. male who is being seen today for the evaluation of bradycardia at the request of [facility].   Chief complaint atrial fibrillation with slow rate   HPI Derek [name] is a 82 y.o. 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 He was noted on admission to be in atrial fibrillation with a slow ventricular response.  Monitoring over the last 48 hours has demonstrated persistent atrial fibrillation with rates ranging from the 30s--60's  He is unaware of his atrial fibrillation. He is also unaware of any changes in his exercise tolerance over the last 1-2 years.  He has some peripheral edema. He has had no lightheadedness or presyncope. He does have transient neurological symptoms suggestive of vertigo and visual disturbances associated with a left frontal headache  History of coronary artery disease with prior bypass surgery 2007.   Peripheral vascular disease with abdominal aortic aneurysm being followed by vascular surgery   8/15  50-55%. Moderate LAE. 9/18 pending  Thromboembolic risk factors ( age -35, HTN-1, TIA/CVA-2, DM-1, Vasc dis-1 ,) for a CHADSVASc Score of 7     Past Medical History:  Diagnosis Date  . AAA (abdominal aortic aneurysm) ([place])   . Arthritis   . COPD (chronic obstructive pulmonary disease) ([place])   . Coronary artery disease   . Hypertension   . Hypothyroidism   . Myocardial infarction ([place])   . Shortness of breath    on exertion;  has productive cough at times  . Sleep apnea    states mild . no cpap      Surgical History:  Past Surgical History:  Procedure Laterality Date  . APPENDECTOMY  1963  . CATARACT EXTRACTION     w/ lid rise  . CORONARY ARTERY BYPASS GRAFT  2007  . EYE SURGERY  Right March 2016   Cataract  . EYE SURGERY Bilateral May 2016   Eyebrow Lift  . EYE SURGERY Left June 2016   Cataract  . KNEE ARTHROSCOPY  02/26/2012   Procedure: ARTHROSCOPY KNEE;  Surgeon: Sydnee Cabal, MD;  Location: [facility];  Service: Orthopedics;  Laterality: Left;  medial menisectomy and chondraplasty     Home Meds: Prior to Admission medications   Medication Sig Start Date End Date Taking? Authorizing Provider  Acetaminophen (TYLENOL PO) Take 1-2 tablets by mouth every 6 (six) hours as needed (pain/headache).   Yes [provider]  aspirin EC 81 MG tablet Take 81 mg by mouth daily after supper.   Yes [provider]  Biotin 5000 MCG TABS Take 5,000 mcg by mouth daily.   Yes [provider]  budesonide-formoterol (SYMBICORT) 160-4.5 MCG/ACT inhaler Inhale 2 puffs into the lungs 2 (two) times daily. Patient taking differently: Inhale 2 puffs into the lungs 2 (two) times daily as needed (shortness of breath/wheezing).  08/02/17  Yes Tanda Rockers, MD  Choline Fenofibrate (TRILIPIX) 135 MG capsule Take 135 mg by mouth daily after supper.    Yes [provider]  ezetimibe (ZETIA) 10 MG tablet Take 10 mg by mouth daily after supper.    Yes [provider]  famotidine (PEPCID) 20 MG tablet Take 20 mg by mouth daily as needed for heartburn or indigestion.    Yes [provider]  Febuxostat (ULORIC) 80 MG TABS Take 80 mg by mouth daily after supper.   Yes [provider]  furosemide (LASIX) 20 MG tablet One daily as needed for swelling Patient taking differently: Take 20 mg by mouth every other day.  10/27/16  Yes Tanda Rockers, MD  GLUCOSAMINE-CHONDROITIN PO Take 1 tablet by mouth 2 (two) times daily. 1500-1200   Yes [provider]  levothyroxine (SYNTHROID, LEVOTHROID) 88 MCG tablet Take 88 mcg by mouth daily before breakfast.   Yes [provider]  Magnesium 250 MG TABS Take 250 mg by  mouth daily.   Yes [provider]  MELATONIN PO Take 1 tablet by mouth at bedtime as needed (sleep).    Yes [provider]  Multiple Vitamin (MULTIVITAMIN WITH MINERALS) TABS tablet Take 1 tablet by mouth daily.   Yes [provider]  Omega-3 Fatty Acids (FISH OIL) 1200 MG CAPS Take 2,400 mg by mouth 2 (two) times daily.   Yes [provider]  OXYGEN Inhale 2 L into the lungs as needed (for  shortness of breath with exertion only).    Yes [provider]  pregabalin (LYRICA) 50 MG capsule Take 50 mg by mouth 2 (two) times daily.   Yes [provider]  rosuvastatin (CRESTOR) 20 MG tablet Take 20 mg by mouth daily after supper.    Yes [provider]  terazosin (HYTRIN) 5 MG capsule Take 10 mg by mouth 2 (two) times daily. 08/30/17  Yes [provider]  tiotropium (SPIRIVA) 18 MCG inhalation capsule Take 18 mcg by mouth daily.   Yes [provider]  Turmeric 500 MG CAPS Take 500 mg by mouth daily.   Yes [provider]  vitamin B-12 (CYANOCOBALAMIN) 1000 MCG tablet Take 1,000 mcg by mouth daily.   Yes [provider]  albuterol (PROAIR HFA) 108 (90 Base) MCG/ACT inhaler Inhale 2 puffs into the lungs every 6 (six) hours as needed for wheezing or shortness of breath. Patient not taking: Reported on 09/11/2017 04/28/16   Tanda Rockers, MD  apixaban (ELIQUIS) 2.5 MG TABS tablet Take 1 tablet (2.5 mg total) by mouth 2 (two) times daily. 09/13/17   Rosalin Hawking, MD    Inpatient Medications:  . apixaban  2.5 mg Oral BID  . aspirin EC  81 mg Oral Daily  . ezetimibe  10 mg Oral Daily  . famotidine  20 mg Oral Daily  . levothyroxine  88 mcg Oral QAC breakfast  . pregabalin  50 mg Oral BID  . rosuvastatin  20 mg Oral q1800      Allergies:  Allergies  Allergen Reactions  . Latex Rash  . Ointment, White Rash    Mycins ointment and pills  . Tape Rash    Reaction to adhesive tape - pls use paper tape     Social History   Social History  . Marital status: Divorced    Spouse name: N/A  . Number of children: N/A  . Years of education: 12+   Occupational History  . Retired     Social History Main Topics  . Smoking status: Former Smoker    Packs/day: 0.75    Years: 51.00    Types: Cigarettes    Quit date: 02/23/2000  . Smokeless tobacco: Never Used  . Alcohol use 0.0 - 0.6 oz/week     Comment: Moderate to little  . Drug use: No  . Sexual activity: Not on file   Other  Topics Concern  . Not on file   Social History Narrative   Patient lives at home alone    Patient is retired    Patient has 3 years of college         Family History  Problem Relation Age of Onset  . Diabetes Mother   . Heart disease Mother        Before age 36  . Diabetes Sister   . Hypertension Sister   . Diabetes Brother   . Hypertension Brother   . Hyperlipidemia Brother   . Hypertension Son   . Heart attack Daughter      ROS:  Please see the history of present illness.     All other systems reviewed and negative.    Physical Exam:  Blood pressure 120/74, pulse (!) 41, temperature 97.6 F (36.4 C), temperature source Oral, resp. rate 19, height 5\' 9"  (1.753 m), weight 217 lb 9.5 oz (98.7 kg), SpO2 98 %. General: Well developed, well nourished male in no acute distress. Head: Normocephalic, atraumatic, sclera non-icteric, no xanthomas, nares are without discharge. EENT: normal Lymph Nodes:  none Back: without scoliosis/kyphosis, no CVA tendersness Neck: Negative for carotid bruits. JVD not elevated. Lungs: Clear bilaterally to auscultation without wheezes, rales, or rhonchi. Breathing is unlabored. Heart: Irregularly irregular with S1 S2.  2/6 systolic  murmur , rubs, or gallops appreciated. Abdomen: Soft, non-tender, non-distended with normoactive bowel sounds. No hepatomegaly. No rebound/guarding. No obvious abdominal masses. Msk:  Strength and tone appear normal for age. Extremities: No  clubbing or cyanosis.  tr edema.  Distal pedal pulses are 2+ and equal bilaterally. Skin: Warm and Dry Neuro: Alert and oriented X 3. CN III-XII intact Grossly normal sensory and motor function . Psych:  Responds to questions appropriately with a normal affect.      Labs: Cardiac Enzymes No results for input(s): CKTOTAL, CKMB, TROPONINI in the last 72 hours. CBC Lab Results  Component Value Date   WBC 5.2 09/13/2017   HGB 11.7 (L) 09/13/2017   HCT 36.1 (L) 09/13/2017   MCV 95.0 09/13/2017   PLT 99 (L) 09/13/2017   PROTIME:  Recent Labs  09/11/17 1755  LABPROT 13.5  INR 1.04   Chemistry  Recent Labs Lab 09/11/17 1755  09/13/17 0154  NA 138  < > 137  K 4.1  < > 3.9  CL 107  < > 109  CO2 24  --  22  BUN 33*  < > 26*  CREATININE 2.16*  < > 1.89*  CALCIUM 9.1  --  8.1*  PROT 6.0*  --   --   BILITOT 0.7  --   --   ALKPHOS 28*  --   --   ALT 18  --   --   AST 29  --   --   GLUCOSE 93  < > 93  < > = values in this interval not displayed. Lipids Lab Results  Component Value Date   CHOL 109 09/12/2017   HDL 43 09/12/2017   LDLCALC 46 09/12/2017   TRIG 102 09/12/2017   BNP No results found for: PROBNP Thyroid Function Tests:  Recent Labs  09/12/17 0217  TSH 1.794      Miscellaneous No results found for: DDIMER  Radiology/Studies:  Ct Angio Head W Or Wo Contrast  Result Date: 09/11/2017 CLINICAL DATA:  Stroke EXAM: CT ANGIOGRAPHY HEAD AND NECK CT PERFUSION BRAIN TECHNIQUE: Multidetector CT imaging of the head and neck was performed using  the standard protocol during bolus administration of intravenous contrast. Multiplanar CT image reconstructions and MIPs were obtained to evaluate the vascular anatomy. Carotid stenosis measurements (when applicable) are obtained utilizing NASCET criteria, using the distal internal carotid diameter as the denominator. Multiphase CT imaging of the brain was performed following IV bolus contrast injection. Subsequent  parametric perfusion maps were calculated using RAPID software. CONTRAST:  100 mL Isovue 370 IV COMPARISON:  CT head 09/11/2017 FINDINGS: CTA NECK FINDINGS Aortic arch: Moderate atherosclerotic calcification in the aortic arch without aneurysm. Proximal great vessels diffusely diseased with calcification but patent. Right carotid system: Right common carotid artery widely patent with minimal atherosclerotic disease. Heavily calcified plaque at the right carotid bifurcation. 70% diameter stenosis right internal carotid artery Left carotid system: Scattered atherosclerotic plaque in the left common carotid artery without significant stenosis. Atherosclerotic calcification in the left carotid bulb 50% diameter stenosis left internal carotid artery Vertebral arteries: Right vertebral dominant. Large calcified plaque right vertebral artery origin. Proximal and mid right vertebral artery occluded. There is reconstitution of the distal right vertebral artery with minimal opacification. The right vertebral artery is diffusely calcified distally. Small left vertebral artery with moderate stenosis proximally. Left vertebral artery ends in PICA and does not contribute to the basilar. Skeleton: No acute skeletal abnormality. Other neck: No mass lesion in the neck. Upper chest: Apical emphysema. Bilateral small pleural effusions. Apical scarring bilaterally. Review of the MIP images confirms the above findings CTA HEAD FINDINGS Anterior circulation: Atherosclerotic calcification throughout the right internal carotid artery through the skull base and cavernous segment with moderate stenosis. Hypoplastic right A1 segment. Right middle cerebral artery patent without stenosis Atherosclerotic calcification throughout the left internal carotid artery at the skullbase and throughout the left cavernous carotid with moderate stenosis. Both anterior cerebral arteries are supplied from the left and patent. Left middle cerebral artery  patent without significant stenosis or occlusion. Posterior circulation: Severe atherosclerotic distal right vertebral artery which shows faint opacification. The right vertebral artery is occluded in the proximal and mid segment. The basilar is diffusely diseased but patent. There is a filling defect in the distal basilar extending into the left P1. Left posterior cerebral artery is patent. Right posterior cerebral artery is patent in the P1 segment and then occludes due to thrombus in the right P2 segment. Small left vertebral artery ends in PICA Venous sinuses: Patent Anatomic variants: None Delayed phase: Not performed Review of the MIP images confirms the above findings CT Brain Perfusion Findings: CBF (<30%) Volume: 11mL Perfusion (Tmax>6.0s) volume: 117mL Mismatch Volume: 164mL Infarction Location:None Delayed perfusion is seen throughout the brainstem and right cerebellum. This could be artifactual. There is also delayed perfusion in the watershed territory of both cerebral hemispheres, right greater than left IMPRESSION: 70% diameter stenosis right internal carotid artery due to heavily calcified plaque. Moderate stenosis right cavernous carotid. 50% diameter stenosis left internal carotid artery due to heavily calcified plaque. Moderate stenosis left cavernous carotid Dominant right vertebral artery which is severely diseased and heavily calcified. The right vertebral artery is occluded in the proximal and midportion with faint reconstitution distally. The basilar is diffusely diseased. There is acute thrombus in the distal basilar. Left posterior cerebral artery is patent with thrombus overlying the left PCA origin. There is occlusion of the right P2 segment due to clot. Left vertebral artery is small and ends in PICA Abnormal CT perfusion with multiple areas of delayed perfusion due to severe atherosclerotic disease. No infarct on CT perfusion. These results  were called by telephone at the time of  interpretation on 09/11/2017 at 7:10 pm to Dr. Rory Percy, who verbally acknowledged these results. Electronically Signed   By: Franchot Gallo M.D.   On: 09/11/2017 19:13   Dg Chest 2 View  Result Date: 09/11/2017 CLINICAL DATA:  Acute ischemic stroke. EXAM: CHEST  2 VIEW COMPARISON:  07/06/2016, 04/02/2016 FINDINGS: Sternotomy wires unchanged. Lungs are adequately inflated and demonstrate mild prominence of the perihilar markings likely vascular congestion/mild edema. Tiny amount of bilateral pleural fluid seen posteriorly. Several bilateral calcified granulomas unchanged. Mild cardiomegaly. Minimal calcified plaque over the thoracic aorta. Mild degenerate change of the spine. IMPRESSION: Mild cardiomegaly with mild vascular congestion/edema. Previous granulomas disease. Aortic Atherosclerosis (ICD10-I70.0). Electronically Signed   By: Marin Olp M.D.   On: 09/11/2017 20:45   Ct Angio Neck W Or Wo Contrast  Result Date: 09/11/2017 CLINICAL DATA:  Stroke EXAM: CT ANGIOGRAPHY HEAD AND NECK CT PERFUSION BRAIN TECHNIQUE: Multidetector CT imaging of the head and neck was performed using the standard protocol during bolus administration of intravenous contrast. Multiplanar CT image reconstructions and MIPs were obtained to evaluate the vascular anatomy. Carotid stenosis measurements (when applicable) are obtained utilizing NASCET criteria, using the distal internal carotid diameter as the denominator. Multiphase CT imaging of the brain was performed following IV bolus contrast injection. Subsequent parametric perfusion maps were calculated using RAPID software. CONTRAST:  100 mL Isovue 370 IV COMPARISON:  CT head 09/11/2017 FINDINGS: CTA NECK FINDINGS Aortic arch: Moderate atherosclerotic calcification in the aortic arch without aneurysm. Proximal great vessels diffusely diseased with calcification but patent. Right carotid system: Right common carotid artery widely patent with minimal atherosclerotic disease.  Heavily calcified plaque at the right carotid bifurcation. 70% diameter stenosis right internal carotid artery Left carotid system: Scattered atherosclerotic plaque in the left common carotid artery without significant stenosis. Atherosclerotic calcification in the left carotid bulb 50% diameter stenosis left internal carotid artery Vertebral arteries: Right vertebral dominant. Large calcified plaque right vertebral artery origin. Proximal and mid right vertebral artery occluded. There is reconstitution of the distal right vertebral artery with minimal opacification. The right vertebral artery is diffusely calcified distally. Small left vertebral artery with moderate stenosis proximally. Left vertebral artery ends in PICA and does not contribute to the basilar. Skeleton: No acute skeletal abnormality. Other neck: No mass lesion in the neck. Upper chest: Apical emphysema. Bilateral small pleural effusions. Apical scarring bilaterally. Review of the MIP images confirms the above findings CTA HEAD FINDINGS Anterior circulation: Atherosclerotic calcification throughout the right internal carotid artery through the skull base and cavernous segment with moderate stenosis. Hypoplastic right A1 segment. Right middle cerebral artery patent without stenosis Atherosclerotic calcification throughout the left internal carotid artery at the skullbase and throughout the left cavernous carotid with moderate stenosis. Both anterior cerebral arteries are supplied from the left and patent. Left middle cerebral artery patent without significant stenosis or occlusion. Posterior circulation: Severe atherosclerotic distal right vertebral artery which shows faint opacification. The right vertebral artery is occluded in the proximal and mid segment. The basilar is diffusely diseased but patent. There is a filling defect in the distal basilar extending into the left P1. Left posterior cerebral artery is patent. Right posterior cerebral  artery is patent in the P1 segment and then occludes due to thrombus in the right P2 segment. Small left vertebral artery ends in PICA Venous sinuses: Patent Anatomic variants: None Delayed phase: Not performed Review of the MIP images confirms the above findings CT  Brain Perfusion Findings: CBF (<30%) Volume: 71mL Perfusion (Tmax>6.0s) volume: 167mL Mismatch Volume: 110mL Infarction Location:None Delayed perfusion is seen throughout the brainstem and right cerebellum. This could be artifactual. There is also delayed perfusion in the watershed territory of both cerebral hemispheres, right greater than left IMPRESSION: 70% diameter stenosis right internal carotid artery due to heavily calcified plaque. Moderate stenosis right cavernous carotid. 50% diameter stenosis left internal carotid artery due to heavily calcified plaque. Moderate stenosis left cavernous carotid Dominant right vertebral artery which is severely diseased and heavily calcified. The right vertebral artery is occluded in the proximal and midportion with faint reconstitution distally. The basilar is diffusely diseased. There is acute thrombus in the distal basilar. Left posterior cerebral artery is patent with thrombus overlying the left PCA origin. There is occlusion of the right P2 segment due to clot. Left vertebral artery is small and ends in PICA Abnormal CT perfusion with multiple areas of delayed perfusion due to severe atherosclerotic disease. No infarct on CT perfusion. These results were called by telephone at the time of interpretation on 09/11/2017 at 7:10 pm to Dr. Rory Percy, who verbally acknowledged these results. Electronically Signed   By: Franchot Gallo M.D.   On: 09/11/2017 19:13   Mr Brain Wo Contrast  Result Date: 09/12/2017 CLINICAL DATA:  Stroke follow-up.  Post tPA EXAM: MRI HEAD WITHOUT CONTRAST TECHNIQUE: Multiplanar, multiecho pulse sequences of the brain and surrounding structures were obtained without intravenous contrast.  COMPARISON:  MRI and CTA 09/11/2017 FINDINGS: Brain: Negative for acute infarct. No significant chronic ischemia. Mild atrophy. Negative for hemorrhage, mass, or fluid collection Vascular: Normal arterial flow voids. Skull and upper cervical spine: Negative Sinuses/Orbits: Bilateral cataract removal. Paranasal sinuses have minimal mucosal edema Other: None IMPRESSION: Negative for acute infarct or hemorrhage. Electronically Signed   By: Franchot Gallo M.D.   On: 09/12/2017 18:01   Ct Cerebral Perfusion W Contrast  Result Date: 09/11/2017 CLINICAL DATA:  Stroke EXAM: CT ANGIOGRAPHY HEAD AND NECK CT PERFUSION BRAIN TECHNIQUE: Multidetector CT imaging of the head and neck was performed using the standard protocol during bolus administration of intravenous contrast. Multiplanar CT image reconstructions and MIPs were obtained to evaluate the vascular anatomy. Carotid stenosis measurements (when applicable) are obtained utilizing NASCET criteria, using the distal internal carotid diameter as the denominator. Multiphase CT imaging of the brain was performed following IV bolus contrast injection. Subsequent parametric perfusion maps were calculated using RAPID software. CONTRAST:  100 mL Isovue 370 IV COMPARISON:  CT head 09/11/2017 FINDINGS: CTA NECK FINDINGS Aortic arch: Moderate atherosclerotic calcification in the aortic arch without aneurysm. Proximal great vessels diffusely diseased with calcification but patent. Right carotid system: Right common carotid artery widely patent with minimal atherosclerotic disease. Heavily calcified plaque at the right carotid bifurcation. 70% diameter stenosis right internal carotid artery Left carotid system: Scattered atherosclerotic plaque in the left common carotid artery without significant stenosis. Atherosclerotic calcification in the left carotid bulb 50% diameter stenosis left internal carotid artery Vertebral arteries: Right vertebral dominant. Large calcified plaque  right vertebral artery origin. Proximal and mid right vertebral artery occluded. There is reconstitution of the distal right vertebral artery with minimal opacification. The right vertebral artery is diffusely calcified distally. Small left vertebral artery with moderate stenosis proximally. Left vertebral artery ends in PICA and does not contribute to the basilar. Skeleton: No acute skeletal abnormality. Other neck: No mass lesion in the neck. Upper chest: Apical emphysema. Bilateral small pleural effusions. Apical scarring bilaterally. Review of the  MIP images confirms the above findings CTA HEAD FINDINGS Anterior circulation: Atherosclerotic calcification throughout the right internal carotid artery through the skull base and cavernous segment with moderate stenosis. Hypoplastic right A1 segment. Right middle cerebral artery patent without stenosis Atherosclerotic calcification throughout the left internal carotid artery at the skullbase and throughout the left cavernous carotid with moderate stenosis. Both anterior cerebral arteries are supplied from the left and patent. Left middle cerebral artery patent without significant stenosis or occlusion. Posterior circulation: Severe atherosclerotic distal right vertebral artery which shows faint opacification. The right vertebral artery is occluded in the proximal and mid segment. The basilar is diffusely diseased but patent. There is a filling defect in the distal basilar extending into the left P1. Left posterior cerebral artery is patent. Right posterior cerebral artery is patent in the P1 segment and then occludes due to thrombus in the right P2 segment. Small left vertebral artery ends in PICA Venous sinuses: Patent Anatomic variants: None Delayed phase: Not performed Review of the MIP images confirms the above findings CT Brain Perfusion Findings: CBF (<30%) Volume: 15mL Perfusion (Tmax>6.0s) volume: 176mL Mismatch Volume: 125mL Infarction Location:None Delayed  perfusion is seen throughout the brainstem and right cerebellum. This could be artifactual. There is also delayed perfusion in the watershed territory of both cerebral hemispheres, right greater than left IMPRESSION: 70% diameter stenosis right internal carotid artery due to heavily calcified plaque. Moderate stenosis right cavernous carotid. 50% diameter stenosis left internal carotid artery due to heavily calcified plaque. Moderate stenosis left cavernous carotid Dominant right vertebral artery which is severely diseased and heavily calcified. The right vertebral artery is occluded in the proximal and midportion with faint reconstitution distally. The basilar is diffusely diseased. There is acute thrombus in the distal basilar. Left posterior cerebral artery is patent with thrombus overlying the left PCA origin. There is occlusion of the right P2 segment due to clot. Left vertebral artery is small and ends in PICA Abnormal CT perfusion with multiple areas of delayed perfusion due to severe atherosclerotic disease. No infarct on CT perfusion. These results were called by telephone at the time of interpretation on 09/11/2017 at 7:10 pm to Dr. Rory Percy, who verbally acknowledged these results. Electronically Signed   By: Franchot Gallo M.D.   On: 09/11/2017 19:13   Mr Brain Limited Wo Contrast  Result Date: 09/11/2017 CLINICAL DATA:  Basilar occlusion.  Stroke. EXAM: MRI HEAD WITHOUT CONTRAST TECHNIQUE: Multiplanar, multiecho pulse sequences of the brain and surrounding structures were obtained without intravenous contrast. COMPARISON:  CT and CT perfusion 09/11/2017 FINDINGS: Brain: Limited sequences. Diffusion and axial FLAIR imaging only was performed. Negative for acute infarct. Generalized atrophy. Minimal chronic ischemic change in the white matter. IMPRESSION: Negative for acute infarct on limited imaging. These results were called by telephone at the time of interpretation on 09/11/2017 at 7:28 pm to Dr.  Amie Portland , who verbally acknowledged these results. Electronically Signed   By: Franchot Gallo M.D.   On: 09/11/2017 19:28   Ct Head Code Stroke Wo Contrast  Result Date: 09/11/2017 CLINICAL DATA:  Code stroke. Focal neuro deficit less than 6 hours. Right-sided weakness EXAM: CT HEAD WITHOUT CONTRAST TECHNIQUE: Contiguous axial images were obtained from the base of the skull through the vertex without intravenous contrast. COMPARISON:  MRI 01/17/2015 FINDINGS: Brain: Generalized atrophy. Negative for hydrocephalus. Negative for acute infarct, hemorrhage, mass lesion. Vascular: Extensive calcification in the right vertebral artery and basilar. Calcification also in the cavernous carotid bilaterally. Hyperdensity distal basilar  suggestive of acute thrombosis. Skull: Negative Sinuses/Orbits: Mild mucosal edema paranasal sinuses. Bilateral cataract removal. Other: None ASPECTS ([place] Stroke Program Early CT Score) - Ganglionic level infarction (caudate, lentiform nuclei, internal capsule, insula, M1-M3 cortex): 7 - Supraganglionic infarction (M4-M6 cortex): 3 Total score (0-10 with 10 being normal): 10 IMPRESSION: 1. No acute infarct. 2. Hyperdense distal basilar suggestive of acute thrombosis. 3. ASPECTS is 10 4. These results were called by telephone at the time of interpretation on 09/11/2017 at 6:18 pm to Dr. Rory Percy, who verbally acknowledged these results. Electronically Signed   By: Franchot Gallo M.D.   On: 09/11/2017 18:21    XBM:[id number] reviewed   atrial fib 41  Narrow QRS Telemetry Personally reviewed  As above    Assessment and Plan:  Atrial fibrillation-newly identified  Bradycardia previously identified sinus now in atrial fibrillation  Stroke  Coronary artery disease with prior bypass  Peripheral vascular disease with  AAA carotid stenosis   Hypertensive heart disease  Renal insufficiency grade 3    the patient had a newly identified atrial fibrillation his underwear. He  has significant bradycardia but his has not noted any significant change in his exercise tolerance over many many months. Hence, the relevance of his bradycardia is not clear. I think he will need pacing.  I suspect he is   also chronotropically  incompetent as telemetry reveals no heart rates that I can find biotin mid 67s.   It would be ideal if pacing could be deferred at least for a month so as to minimize the risk of bleeding while on anticoagulation. We're noting increasing bleeding issues associated with pacing during ongoing DOAC therapy   He is not sure he can undergo treadmill testing for chronotropic competence.  Given the paucity of attributable symptoms I would prefer to wait for a few weeks   Will discuss with Dr [facility] in am   On apixoban and asa, the latter per neuro given per vas disease      Virl Axe

## 2017-09-13 NOTE — Therapy (Signed)
Occupational Therapy Treatment Patient Details Name: Derek [name] MRN: [id number] DOB: 31-Mar-1935 Today's Date: 09/13/2017    History of present illness 82 y.o. male admitted on 09/11/17 with AMS, R sided weakness, CT revealed a heavily calcified right vertebral artery and basilar artery on the noncontrast CT of the head, and also had a suspicious looking finding which was concerning for thrombus on the distal basilar.  IV tPA was given.  EKG was also given and revealed A-fib with slow ventricular rate (HR was 30s-40s in CT scanner).  Pt also went to IR for S/P bilateral common carotid artery and RT subclavian arteriograms on 09/12/17.  Pt with significant PMH of SOB, MI, HTN, CAD, COPD, AAA, arthritis s/p L knee arthroscopy, CABG.     OT comments  Pt reports being independent in ADLs, IADLs, and was actively driving and traveling to visit family PTA. Currently pt requires min guard during functional mobility for safety and supervision to modified independent for all other ADLs. Pt presents with some impulsivity and lack of safety awareness- will further assess. OT will follow acutely to address established goals.    Follow Up Recommendations  Outpatient OT;Supervision/Assistance - 24 hour    Equipment Recommendations  None recommended by OT    Recommendations for Other Services      Precautions / Restrictions Precautions Precautions: Fall Restrictions Weight Bearing Restrictions: No       Mobility Bed Mobility               General bed mobility comments: Pt sitting in chair upon OT arrival  Transfers Overall transfer level: Needs assistance Equipment used: None Transfers: Sit to/from Stand Sit to Stand: Min guard         General transfer comment: Min guard for safety.     Balance Overall balance assessment: Modified Independent                                         ADL either performed or assessed with clinical judgement   ADL Overall ADL's :  Needs assistance/impaired Eating/Feeding: Modified independent;Sitting   Grooming: Modified independent;Standing   Upper Body Bathing: Modified independent;Sitting   Lower Body Bathing: Supervison/ safety;Set up;Sitting/lateral leans   Upper Body Dressing : Modified independent;Sitting   Lower Body Dressing: Supervision/safety;Set up;Sit to/from stand   Toilet Transfer: Supervision/safety;Ambulation;Comfort height toilet   Toileting- Clothing Manipulation and Hygiene: Supervision/safety;Sit to/from stand   Tub/ Shower Transfer: Supervision/safety;Set up;Ambulation;Grab bars   Functional mobility during ADLs: Min guard       Vision Baseline Vision/History: Wears glasses Wears Glasses: Reading only Patient Visual Report: No change from baseline     Perception     Praxis      Cognition Arousal/Alertness: Awake/alert Behavior During Therapy: WFL for tasks assessed/performed Overall Cognitive Status: History of cognitive impairments - at baseline                                 General Comments: Pt with some impulsivity, lack of insight and safety awareness. Difficulty alternating attention, difficulty naming common items (only 2 animals that start with the letter D), difficulty with verbal fluency.  Will further assess.        Exercises     Shoulder Instructions       General Comments Pts girlfriend present during evaluation. Pts HR from  37 to 70s during functional mobility. Nursing notified of HR.     Pertinent Vitals/ Pain       Pain Assessment: No/denies pain  Home Living Family/patient expects to be discharged to:: Private residence Living Arrangements: Alone Available Help at Discharge: Other (Comment) Type of Home: Apartment Home Access: Level entry     Home Layout: One level     Bathroom Shower/Tub: Tub/shower unit;Curtain   Bathroom Toilet: Handicapped height Bathroom Accessibility: No   Home Equipment: Cane - single point;Grab  bars - tub/shower;Other (comment);Walker - 2 wheels Management consultant)   Additional Comments: Kids/girlfriend can assist as needed  Lives With: Alone    Prior Functioning/Environment Level of Independence: Independent (Drives; enjoys traveling)            Frequency  Min 2X/week        Progress Toward Goals  OT Goals(current goals can now be found in the care plan section)     Acute Rehab OT Goals Patient Stated Goal: To go home and get back to driving/traveling OT Goal Formulation: With patient Time For Goal Achievement: 09/13/17 Potential to Achieve Goals: Good  Plan      Co-evaluation                 AM-PAC PT "6 Clicks" Daily Activity     Outcome Measure   Help from another person eating meals?: None Help from another person taking care of personal grooming?: None Help from another person toileting, which includes using toliet, bedpan, or urinal?: None Help from another person bathing (including washing, rinsing, drying)?: None Help from another person to put on and taking off regular upper body clothing?: None Help from another person to put on and taking off regular lower body clothing?: None 6 Click Score: 24    End of Session Equipment Utilized During Treatment: Gait belt  OT Visit Diagnosis: Unsteadiness on feet (R26.81);Muscle weakness (generalized) (M62.81)   Activity Tolerance Patient tolerated treatment well   Patient Left in chair;with call bell/phone within reach;with family/visitor present   Nurse Communication Mobility status        Time: 1761-6073 OT Time Calculation (min): 27 min  Charges:    Boykin Peek, OTS [phone]    Boykin Peek 09/13/2017, 5:12 PM

## 2017-09-13 NOTE — Progress Notes (Signed)
STROKE TEAM PROGRESS NOTE   SUBJECTIVE (INTERVAL HISTORY) His girlfriend and other family members are at the bedside. Pt has been doing well over night, sitting in chair during round. BP stable. HR still at 40s. Cardiology consider either home monitoring or pacer. DSA showed acute right VA occlusion but BA thrombus is gone.    OBJECTIVE Temp:  [97.4 F (36.3 C)-98 F (36.7 C)] 97.6 F (36.4 C) (09/24 1200) Pulse Rate:  [31-122] 40 (09/24 1500) Cardiac Rhythm: Atrial fibrillation (09/24 0800) Resp:  [10-23] 19 (09/24 1500) BP: (114-166)/(41-120) 123/89 (09/24 1500) SpO2:  [92 %-100 %] 100 % (09/24 1500)  CBC:   Recent Labs Lab 09/11/17 1755 09/11/17 1801 09/13/17 0154  WBC 5.1  --  5.2  NEUTROABS 2.5  --   --   HGB 12.8* 12.6* 11.7*  HCT 38.8* 37.0* 36.1*  MCV 94.4  --  95.0  PLT 112*  --  99*    Basic Metabolic Panel:   Recent Labs Lab 09/11/17 1755 09/11/17 1801 09/13/17 0154  NA 138 140 137  K 4.1 4.1 3.9  CL 107 104 109  CO2 24  --  22  GLUCOSE 93 91 93  BUN 33* 34* 26*  CREATININE 2.16* 2.20* 1.89*  CALCIUM 9.1  --  8.1*    Lipid Panel:     Component Value Date/Time   CHOL 109 09/12/2017 0217   TRIG 102 09/12/2017 0217   HDL 43 09/12/2017 0217   CHOLHDL 2.5 09/12/2017 0217   VLDL 20 09/12/2017 0217   LDLCALC 46 09/12/2017 0217   HgbA1c:  Lab Results  Component Value Date   HGBA1C 5.4 09/12/2017   Urine Drug Screen: No results found for: LABOPIA, COCAINSCRNUR, LABBENZ, AMPHETMU, THCU, LABBARB  Alcohol Level No results found for: [place] I have personally reviewed the radiological images below and agree with the radiology interpretations.  Ct Angio Head W Or Wo Contrast Ct Angio Neck W Or Wo Contrast Ct Cerebral Perfusion W Contrast 09/11/2017 IMPRESSION:  70% diameter stenosis right internal carotid artery due to heavily calcified plaque. Moderate stenosis right cavernous carotid. 50% diameter stenosis left internal carotid artery due  to heavily calcified plaque. Moderate stenosis left cavernous carotid Dominant right vertebral artery which is severely diseased and heavily calcified.  The right vertebral artery is occluded in the proximal and midportion with faint reconstitution distally.  The basilar is diffusely diseased. There is acute thrombus in the distal basilar.  Left posterior cerebral artery is patent with thrombus overlying the left PCA origin.  There is occlusion of the right P2 segment due to clot.  Left vertebral artery is small and ends in PICA  Abnormal CT perfusion with multiple areas of delayed perfusion due to severe atherosclerotic disease. No infarct on CT perfusion.   Mr Brain Limited Wo Contrast 09/11/2017 Negative for acute infarct on limited imaging.   Ct Head Code Stroke Wo Contrast 09/11/2017 IMPRESSION:  1. No acute infarct.  2. Hyperdense distal basilar suggestive of acute thrombosis.  3. ASPECTS is 10   Dg Chest 2 View 09/11/2017 Mild cardiomegaly with mild vascular congestion/edema. Previous granulomas disease. Aortic Atherosclerosis (ICD10-I70.0).   Cerebral Angiogram 09/12/2017 1 Dominant RT VA origin recent  occlusion  over underlying stenosis with partial distal distal reconstituition of RT VA at C1. BA patent. 2. Approx 75 % stenosis of RT ICA prox . 3.7.59mm x 3.3 RT ICA cavernous aneyrysm,and/2.69mm x 2.59mm LT PCOM aneurysm   TTE pending  MRI brain 09/12/17  Negative for acute infarct or hemorrhage.   PHYSICAL EXAM Vitals:   09/13/17 1200 09/13/17 1254 09/13/17 1300 09/13/17 1500  BP:   (!) 149/48 123/89  Pulse:  (!) 122 (!) 46 (!) 40  Resp:   18 19  Temp: 97.6 F (36.4 C)     TempSrc: Oral     SpO2:  94% 99% 100%  Weight:      Height:        Temp:  [97.4 F (36.3 C)-98 F (36.7 C)] 97.6 F (36.4 C) (09/24 1200) Pulse Rate:  [31-122] 40 (09/24 1500) Resp:  [10-23] 19 (09/24 1500) BP: (114-166)/(41-120) 123/89 (09/24 1500) SpO2:  [92 %-100 %] 100 % (09/24  1500)  General - Well nourished, well developed, in no apparent distress.  Ophthalmologic - Fundi not visualized due to noncooperation.  Cardiovascular - irregularly irregular heart rate and rhythm with bradycardia.  Mental Status -  Level of arousal and orientation to time, place, and person were intact. Language including expression, naming, repetition, comprehension was assessed and found intact. Attention span and concentration were normal. Fund of Knowledge was assessed and was intact.  Cranial Nerves II - XII - II - Visual field intact OU. III, IV, VI - Extraocular movements intact. V - Facial sensation intact bilaterally. VII - mild right facial droop. VIII - Hearing & vestibular intact bilaterally. X - Palate elevates symmetrically.Marland Kitchen XI - Chin turning & shoulder shrug intact bilaterally. XII - Tongue protrusion intact.  Motor Strength - The patient's strength was normal in all extremities and pronator drift was absent.  Bulk was normal and fasciculations were absent.   Motor Tone - Muscle tone was assessed at the neck and appendages and was normal.  Reflexes - The patient's reflexes were 1+ in all extremities and he had no pathological reflexes.  Sensory - Light touch, temperature/pinprick were assessed and were symmetrical.    Coordination - The patient had normal movements in the hands with no ataxia or dysmetria.  Tremor was absent.  Gait and Station - not tested   ASSESSMENT/PLAN Derek [name] is a 82 y.o. male with history of hypertension, CAD s/p CABG, COPD on home O2, Bradycardia - possible AVB, and hypothyroidism presenting with unresponsiveness with subsequent right-sided weakness, dysarthria, and lethargy. TPA Saturday 09/11/2017 at 2030  Posterior TIA - embolic due to right VA acute occlusion. BA thrombus which was resolved. Right P2 occlusion.    Resultant  Right facial droop  CT head - No acute infarct.   MRI head - Negative for acute  infarct  Cerebral Angiogram - right VA origin acute occlusion with partial distal reconstitution. Right ICA 75% stenosis. 7.53mm x 3.3 RT ICA cavernous aneyrysm,and/2.23mm x 2.85mm LT PCOM aneurysm   CTA H&N - 70% Rt ICA - Occluded Rt VA - acute basilar artery thrombus - Lt PCA thrombus - occluded Rt P2   2D Echo - pending  LDL - 46  HgbA1c - 5.4  VTE prophylaxis - SCDs Diet Heart Room service appropriate? Yes; Fluid consistency: Thin  aspirin 81 mg daily prior to admission, now on ASA 81mg  and eliquis 2.5mg  bid.  Patient counseled to be compliant with his antithrombotic medications  Ongoing aggressive stroke risk factor management  Therapy recommendations:  pending  Disposition: Pending  New onset afib with bradycardia  Cardiology on board  HR as low as 29 with long pause  TSH - 1.79  2D echo pending  On eliquis 2.5mg  bid due to age and Cre  hx of presumed AV block per Dr. Tamala Julian OV 09/07/14. Resolved off BB  F/u with cardiology recs regarding Pacemaker vs Home heart monitoring  Severe extra- and intracranial stenosis  CTA head and neck - 70% Rt ICA - Occluded Rt VA - acute basilar artery thrombus - Lt PCA thrombus - occluded Rt P2 - b/l siphon atherosclerosis  DSA - right VA origin acute occlusion with partial distal reconstitution. Right ICA 75% stenosis. 7.53mm x 3.3 RT ICA cavernous aneyrysm,and/2.65mm x 2.11mm LT PCOM aneurysm   On ASA 81mg  on top of eliquis  Follow up with Dr. Estanislado Pandy as outpt  Right ICA stenosis  CTA and DSA showed 70% right ICA stenosis  Asymptomatic at this time  Follow-up with Dr. Estanislado Pandy as outpt  Hypertension  Stable  Permissive hypertension (OK if < 180/105) but gradually normalize in 5-7 days  Long-term BP goal 130-150 due to severe vascular atherosclerosis  Hyperlipidemia  Home meds: Crestor 20 mg daily and Zetia 10 mg daily resumed in hospital  LDL 46, goal < 70  Continue medications at discharge  Other Stroke  Risk Factors  Advanced age  Former smoker - quit in 2001  ETOH use, advised to drink no more than 1 - 2 drink(s) a day  Obesity, Body mass index is 32.13 kg/m., recommend weight loss, diet and exercise as appropriate   Coronary artery disease s/p 4V CABG  Other Active Problems  COPD - Dr Melvyn Novas in past  CKD stage III -  BUN - 34 ; creatinine 2.20 -> 1.89  Follow-up with Dr Jaynee Eagles [facility] in the past for dizziness   Hospital day # 2  This patient is critically ill due to basilar artery thrombosis, severe intra and extra cranial atherosclerosis, atrial fibrillation, bradycardia and at significant risk of neurological worsening, death form recurrent stroke, hemorrhagic conversion, heart failure, seizure, cardiac arrest. This patient's care requires constant monitoring of vital signs, hemodynamics, respiratory and cardiac monitoring, review of multiple databases, neurological assessment, discussion with family, other specialists and medical decision making of high complexity. I spent 35 minutes of neurocritical care time in the care of this patient.  Rosalin Hawking, MD PhD Stroke Neurology 09/13/2017 3:44 PM   To contact Stroke Continuity provider, please refer to [email or web address] After hours, contact General Neurology

## 2017-09-13 NOTE — Progress Notes (Signed)
Pt was earlier transferred from [place] ICU at 2140, claimed to have left his wallet down in ICU, Nurse Abby who transferred pt was quickly called to search the room that he was transferred from, no wallet was seen as the room was already cleaned awaiting the next pt. The wallet was charted in his admission record to have been taken home by his girlfriend. Pt's son Patrick Jupiter was also called who also confirmed that the girlfriend earlier had the wallet but the pt demanded that she brings it back to him which she did. Pt observed to be getting agitated, removed his telemetry box, put on his street clothes and threatening to leave. AC Crystal called and notified, security also paged and notified, promised to come up to speak with the pt, will however continue to monitor. Obas[place]-Asidi, Judeth Gilles Efe

## 2017-09-13 NOTE — Discharge Summary (Signed)
Stroke Discharge Summary  Patient ID: Derek [name]   MRN: [id number]      DOB: 1935/01/10  Date of Admission: 09/11/2017 Date of Discharge: 09/14/2017  Attending Physician:  Rosalin Hawking, MD, Stroke MD Consultant(s):   Treatment Team:  Lbcardiology, Rounding, MD interventional radiology  Patient's PCP:  Merrilee Seashore, MD  DISCHARGE DIAGNOSIS:  Active Problems:   Posterior TIA due to acute occlusion of right VA and embolism of BA and left PCA   Acute ischemic stroke ([place])   AV block and bardycardia   Hyperlipidemia   Essential hypertension   Hx of CABG   Paroxysmal atrial fibrillation ([place])   Right carotid stenosis 75%   Intracranial vascular stenosis   Vertebral basilar insufficiency   Obesity    CKD stage III   Past Medical History:  Diagnosis Date  . AAA (abdominal aortic aneurysm) ([place])   . Arthritis   . COPD (chronic obstructive pulmonary disease) ([place])   . Coronary artery disease   . Hypertension   . Hypothyroidism   . Myocardial infarction ([place])   . Shortness of breath    on exertion;  has productive cough at times  . Sleep apnea    states mild . no cpap   Past Surgical History:  Procedure Laterality Date  . APPENDECTOMY  1963  . CATARACT EXTRACTION     w/ lid rise  . CORONARY ARTERY BYPASS GRAFT  2007  . EYE SURGERY Right March 2016   Cataract  . EYE SURGERY Bilateral May 2016   Eyebrow Lift  . EYE SURGERY Left June 2016   Cataract  . KNEE ARTHROSCOPY  02/26/2012   Procedure: ARTHROSCOPY KNEE;  Surgeon: Sydnee Cabal, MD;  Location: [facility];  Service: Orthopedics;  Laterality: Left;  medial menisectomy and chondraplasty    Allergies as of 09/13/2017      Reactions   Latex Rash   Ointment, White Rash   Mycins ointment and pills   Tape Rash   Reaction to adhesive tape - pls use paper tape        Discharge Care Instructions        Start     Ordered   09/13/17 0000  apixaban (ELIQUIS) 2.5 MG TABS tablet  2 times  daily     09/13/17 1337   09/13/17 0000  Ambulatory referral to Neurology    Comments:  Pt will follow up with Dr. Erlinda Hong at [facility] in about 6 weeks. Thanks.   09/13/17 1337      LABORATORY STUDIES CBC    Component Value Date/Time   WBC 6.1 09/14/2017 0711   RBC 4.17 (L) 09/14/2017 0711   HGB 12.5 (L) 09/14/2017 0711   HCT 39.6 09/14/2017 0711   PLT 110 (L) 09/14/2017 0711   MCV 95.0 09/14/2017 0711   MCH 30.0 09/14/2017 0711   MCHC 31.6 09/14/2017 0711   RDW 14.8 09/14/2017 0711   LYMPHSABS 1.7 09/11/2017 1755   MONOABS 0.4 09/11/2017 1755   EOSABS 0.3 09/11/2017 1755   BASOSABS 0.2 (H) 09/11/2017 1755   CMP    Component Value Date/Time   NA 140 09/14/2017 0711   K 4.1 09/14/2017 0711   CL 109 09/14/2017 0711   CO2 23 09/14/2017 0711   GLUCOSE 97 09/14/2017 0711   BUN 22 (H) 09/14/2017 0711   CREATININE 1.89 (H) 09/14/2017 0711   CALCIUM 8.4 (L) 09/14/2017 0711   PROT 6.0 (L) 09/11/2017 1755   ALBUMIN 3.7  09/11/2017 1755   AST 29 09/11/2017 1755   ALT 18 09/11/2017 1755   ALKPHOS 28 (L) 09/11/2017 1755   BILITOT 0.7 09/11/2017 1755   GFRNONAA 32 (L) 09/14/2017 0711   GFRAA 37 (L) 09/14/2017 0711   COAGS Lab Results  Component Value Date   INR 1.04 09/11/2017   Lipid Panel    Component Value Date/Time   CHOL 109 09/12/2017 0217   TRIG 102 09/12/2017 0217   HDL 43 09/12/2017 0217   CHOLHDL 2.5 09/12/2017 0217   VLDL 20 09/12/2017 0217   LDLCALC 46 09/12/2017 0217   HgbA1C  Lab Results  Component Value Date   HGBA1C 5.4 09/12/2017   Urinalysis No results found for: COLORURINE, APPEARANCEUR, LABSPEC, PHURINE, GLUCOSEU, HGBUR, BILIRUBINUR, KETONESUR, PROTEINUR, UROBILINOGEN, NITRITE, LEUKOCYTESUR Urine Drug Screen No results found for: LABOPIA, COCAINSCRNUR, LABBENZ, AMPHETMU, THCU, LABBARB  Alcohol Level No results found for: [place] I have personally reviewed the radiological images below and agree with the radiology  interpretations.  Ct Angio Head W Or Wo Contrast Ct Angio Neck W Or Wo Contrast Ct Cerebral Perfusion W Contrast 09/11/2017 IMPRESSION:  70% diameter stenosis right internal carotid artery due to heavily calcified plaque. Moderate stenosis right cavernous carotid. 50% diameter stenosis left internal carotid artery due to heavily calcified plaque. Moderate stenosis left cavernous carotid Dominant right vertebral artery which is severely diseased and heavily calcified.  The right vertebral artery is occluded in the proximal and midportion with faint reconstitution distally.  The basilar is diffusely diseased. There is acute thrombus in the distal basilar.  Left posterior cerebral artery is patent with thrombus overlying the left PCA origin.  There is occlusion of the right P2 segment due to clot.  Left vertebral artery is small and ends in PICA  Abnormal CT perfusion with multiple areas of delayed perfusion due to severe atherosclerotic disease. No infarct on CT perfusion.   Mr Brain Limited Wo Contrast 09/11/2017 Negative for acute infarct on limited imaging.   Ct Head Code Stroke Wo Contrast 09/11/2017 IMPRESSION:  1. No acute infarct.  2. Hyperdense distal basilar suggestive of acute thrombosis.  3. ASPECTS is 10   Dg Chest 2 View 09/11/2017 Mild cardiomegaly with mild vascular congestion/edema. Previous granulomas disease. Aortic Atherosclerosis (ICD10-I70.0).   Cerebral Angiogram 09/12/2017 1 Dominant RT VA origin recent occlusion over underlying stenosis with partial distal distal reconstituition of RT VA at C1. BA patent. 2. Approx 75 % stenosis of RT ICA prox . 3.7.27mm x 3.3 RT ICA cavernous aneyrysm,and/2.35mm x 2.30mm LT PCOM aneurysm   TTE - The patient was in atrial fibrillation. Normal LV size with mild   LV hypertrophy. EF 55%. Mildly dilated RV with normal systolic   function. Mildly D-shaped interventricular septum suggesting RV   pressure/volume overload. Dilated  IVC suggests elevated RV   filling pressure.  MRI brain 09/12/17 Negative for acute infarct or hemorrhage.     HISTORY OF PRESENT ILLNESS Chrles A Oxfordis a 82 y.o.malewas a past medical history of hypertension, coronary artery disease status post CABG, COPD on home oxygen, and hypothyroidism, who was in his usual state of health until about 5:30 PM when he had sudden onset of unresponsiveness witnessed by his girlfriend. They were cooking at home and he suddenly became unresponsive. She called 911 immediately. The patient was evaluated by EMS and found to have right-sided weakness and was not answering questions appropriately. Activated large vessel occlusion code stroke and brought the patient to  the ER. In the emergency room, at the bridge on first examination, the patient was lethargic, dysarthric, poor attention concentration and nonfocal in terms of weakness. He was taken in for a stat noncontrast CT of the head, that revealed a heavily calcified right vertebral artery and basilar artery on the noncontrast CT of the head, and also had a suspicious looking finding which was concerning for thrombus on the distal basilar. This was confirmed with the neuroradiologist on call who also agreed with the finding of possible thrombus in the setting of a heavily calcified basilar. Repeat exam of the patient showed disconjugate gaze, inability to cross midline to the right and worsening dysarthria. He was also at that time weak on the right upper extremity. At that time, decision was made to use IV TPA. A CT angiogram of the head was done, which showed a heavily calcified right vertebral artery, stenotic basilar with probable distal occlusion, and left P2 occlusion. Left vertebral artery was nondominant and ended in a PICA. As these imaging studies were done, we also ordered a stat MRI brain to evaluate for any evidence of infarct. Neuro interventionist, Dr. Estanislado Pandy, was also contacted and reviewed  the images. MRI of the brain did not reveal any evidence of acute ischemia in the brainstem. The patient's exam also started to improve, with him becoming more awake, following all commands, speech becoming less dysarthric. He still did have mild right facial droop and mild right upper extremity weakness. His gaze had no restriction and extraocular movements became intact. At that time, decision was made not to take him for an acute intervention. He will be admitted to the ICU at this time. If his neuro status changes, neurology will reevaluate and the neuro interventionalist will be recontacted for emergent procedure.  Patient self and reported that he had been having cough and thick phlegm for the prior 2 weeks. He did not have any fever of chills. He was complaining of some shortness of breath. He had no chest discomfort. No palpitations.  EMS also noted that his EKG showed atrial fibrillation with slow ventricular rate.   Patients HR was in 30s to 40s for the longest time in the CT and his imaging (CTA and MRI was delayed because of this)  LKW: 5:15 PM tpa given?: Yes TPA Saturday 09/11/2017 at 2030 Premorbid modified Rankin scale (mRS):1    HOSPITAL COURSE Mr. ZAVION SLEIGHT is a 82 y.o. male with history of hypertension, CAD s/p CABG, COPDon home O2, Bradycardia - possible AVB, and hypothyroidism presenting with unresponsiveness with subsequent right-sided weakness, dysarthria, and lethargy. TPA Saturday 09/11/2017 at 2030  Posterior TIA - embolic due to right VA acute occlusion and BA thrombus which was resolved. Right P2 occlusion likely chronic.  Resultant  mild right facial droop  CT head - No acute infarct.   MRI head - Negative for acute infarct  Cerebral Angiogram - right VA origin acute occlusion with partial distal reconstitution. Right ICA 75% stenosis. 7.102mm x 3.3 RT ICA cavernous aneyrysm,and/2.53mm x 2.18mm LT PCOM aneurysm   CTA H&N - 70% Rt ICA - Occluded Rt VA -  acute basilar artery thrombus - Lt PCA thrombus - occluded Rt P2   2D Echo - EF 55%  LDL - 46  HgbA1c - 5.4  VTE prophylaxis - SCDs  Diet NPO time specified  aspirin 81 mg daily prior to admission, now on ASA 81mg  and eliquis 2.5mg  bid. Continue on discharge.  Patient counseled to be compliant  with his antithrombotic medications  Ongoing aggressive stroke risk factor management  Therapy recommendations:  pending  Disposition: Pending  New onset afib with bradycardia  Cardiology on board  HR as low as 29 with long pause  TSH - 1.79  2D echo EF55%  On ASA and eliquis now for stroke prevention  Bradycardia (hx of presumed AV block per Dr Tamala Julian OV 09/07/14. Resolved off BB  Follow up with cardiology in 2 weeks to discuss about pacemaker  Severe extra- and intracranial stenosis  CTA head and neck - 70% Rt ICA - Occluded Rt VA - acute basilar artery thrombus - Lt PCA thrombus - occluded Rt P2 - b/l siphon atherosclerosis  DSA - right VA origin acute occlusion with partial distal reconstitution. Right ICA 75% stenosis. 7.69mm x 3.3 RT ICA cavernous aneyrysm,and/2.74mm x 2.60mm LT PCOM aneurysm   Continue ASA 81mg  on top of eliquis.   Follow up with Dr. Estanislado Pandy as outpt  Right ICA stenosis  CTA and DSA showed 70% right ICA stenosis  Asymptomatic at this time  Follow-up with Dr. Estanislado Pandy as outpt  Hypertension  Stable  Permissive hypertension (OK if < 180/105) but gradually normalize in 5-7 days  Long-term BP goal 130-150 due to severe vascular atherosclerosis  Hyperlipidemia  Home meds: Crestor 20 mg daily and Zetia 10 mg daily resumed in hospital  LDL 46, goal < 70  Continue crestor and zetia medications at discharge  Other Stroke Risk Factors  Advanced age  Former smoker - quit in 2001  ETOH use, advised to drink no more than 1 - 2 drink(s) a day  Obesity, Body mass index is 32.13 kg/m., recommend weight loss, diet and exercise as  appropriate   Coronary artery disease s/p 4V CABG  Other Active Problems  COPD - Dr Melvyn Novas in past  CKD stage III -  BUN - 34 ; creatinine 2.20 -> 1.89->1.89  Follow-up with Dr Jaynee Eagles at [facility] in the past for dizziness  DISCHARGE EXAM Blood pressure (!) 144/54, pulse (!) 42, temperature 98.1 F (36.7 C), temperature source Oral, resp. rate 18, height 5\' 9"  (1.753 m), weight 215 lb (97.5 kg), SpO2 95 %.  General - Well nourished, well developed, in no apparent distress.  Ophthalmologic - Fundi not visualized due to noncooperation.  Cardiovascular - irregularly irregular heart rate and rhythm with bradycardia.  Mental Status -  Level of arousal and orientation to time, place, and person were intact. Language including expression, naming, repetition, comprehension was assessed and found intact. Attention span and concentration were normal. Fund of Knowledge was assessed and was intact.  Cranial Nerves II - XII - II - Visual field intact OU. III, IV, VI - Extraocular movements intact. V - Facial sensation intact bilaterally. VII - mild right facial droop. VIII - Hearing & vestibular intact bilaterally. X - Palate elevates symmetrically. XI - Chin turning & shoulder shrug intact bilaterally. XII - Tongue protrusion intact.  Motor Strength - The patient's strength was normal in all extremities and pronator drift was absent.  Bulk was normal and fasciculations were absent.   Motor Tone - Muscle tone was assessed at the neck and appendages and was normal.  Reflexes - The patient's reflexes were 1+ in all extremities and he had no pathological reflexes.  Sensory - Light touch, temperature/pinprick were assessed and were symmetrical.    Coordination - The patient had normal movements in the hands with no ataxia or dysmetria.  Tremor was absent.  Gait and Station -  deferred  Discharge Diet   Diet Heart Room service appropriate? Yes; Fluid consistency: Thin Diet - low  sodium heart healthy liquids  DISCHARGE PLAN  Disposition:  home  aspirin 81 mg daily and Eliquis (apixaban) daily for secondary stroke prevention.  Ongoing risk factor control by Primary Care Physician at time of discharge  Follow-up Merrilee Seashore, MD in 2 weeks.  Follow up with Dr. Estanislado Pandy as outpt in 2 months  Follow up with cardiology to discuss about pacemaker in 2 weeks  Continue outpt PT/OT.   Follow-up with Dr. Jaynee Eagles at [facility] in 6 weeks, office to schedule an appointment.  45 minutes were spent preparing discharge.  Rosalin Hawking, MD PhD Stroke Neurology 09/14/2017 3:54 PM

## 2017-09-13 NOTE — Evaluation (Signed)
Speech Language Pathology Evaluation Patient Details Name: Derek [name] MRN: [id number] DOB: [birth year] Today's Date: 09/13/2017 Time: 0277-4128 SLP Time Calculation (min) (ACUTE ONLY): 20 min  Problem List:  Patient Active Problem List   Diagnosis Date Noted  . Paroxysmal atrial fibrillation ([place]) 09/12/2017  . Carotid stenosis 09/12/2017  . Vertebral basilar insufficiency 09/12/2017  . Acute ischemic stroke ([place])   . Persistent atrial fibrillation ([place])   . AV block   . Hyperlipidemia   . Essential hypertension   . Hx of CABG   . 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 Chronic respiratory failure with hypoxia ([place]) 02/17/2017  . Morbid obesity ([place]) 04/04/2016  . Leg swelling 04/04/2016  . COPD GOLD II  04/02/2016  . AAA (abdominal aortic aneurysm) without rupture ([place]) 10/01/2014  . Coronary artery disease involving coronary bypass graft of native heart without angina pectoris 07/24/2014  . Dyspnea 07/24/2014  . Faintness 07/24/2014  . COLD (chronic obstructive lung disease) ([place]) 07/24/2014  . Bradycardia 07/24/2014  . Vertigo 07/24/2014  . Aorto-iliac disease ([place]) 09/28/2013  . Abdominal aneurysm without mention of rupture 09/08/2012   Past Medical History:  Past Medical History:  Diagnosis Date  . AAA (abdominal aortic aneurysm) ([place])   . Arthritis   . COPD (chronic obstructive pulmonary disease) ([place])   . Coronary artery disease   . Hypertension   . Hypothyroidism   . Myocardial infarction ([place])   . Shortness of breath    on exertion;  has productive cough at times  . Sleep apnea    states mild . no cpap   Past Surgical History:  Past Surgical History:  Procedure Laterality Date  . APPENDECTOMY  1963  . CATARACT EXTRACTION     w/ lid rise  . CORONARY ARTERY BYPASS GRAFT  2007  . EYE SURGERY Right March 2016   Cataract  . EYE SURGERY Bilateral May 2016   Eyebrow Lift  . EYE SURGERY Left June 2016   Cataract  . KNEE ARTHROSCOPY   02/26/2012   Procedure: ARTHROSCOPY KNEE;  Surgeon: Sydnee Cabal, MD;  Location: [facility];  Service: Orthopedics;  Laterality: Left;  medial menisectomy and chondraplasty   HPI:  82 YO man with history of CAD s/p 4V CABG, HTN, hypothyroid, OSA, COPD, AAA presenting with a basilar artery stroke s/p IV TPA. MRI complete after TPA negative for acute infarct.    Assessment / Plan / Recommendation Clinical Impression  Cognitive-linguistic evaluation complete. Cognitive-linguistic skills are WFL at this time. No acute SLP f/u indicated.     SLP Assessment  SLP Recommendation/Assessment: Patient does not need any further Speech Lanaguage Pathology Services SLP Visit Diagnosis: Cognitive communication deficit (R41.841)    Follow Up Recommendations  None          SLP Evaluation Cognition  Overall Cognitive Status: Within Functional Limits for tasks assessed Orientation Level: Oriented X4       Comprehension  Auditory Comprehension Overall Auditory Comprehension: Appears within functional limits for tasks assessed Visual Recognition/Discrimination Discrimination: Within Function Limits Reading Comprehension Reading Status: Not tested    Expression Expression Primary Mode of Expression: Verbal Verbal Expression Overall Verbal Expression: Appears within functional limits for tasks assessed Written Expression Dominant Hand: Right   Oral / Motor  Oral Motor/Sensory Function Overall Oral Motor/Sensory Function: Within functional limits Motor Speech Overall Motor Speech: Appears within functional limits for tasks assessed   GO  [place], CCC-SLP [phone]          Kinser Fellman Meryl 09/13/2017, 10:30 AM

## 2017-09-13 NOTE — Progress Notes (Signed)
Patient ID: Derek [name], male   DOB: 1935-01-09, 82 y.o.   MRN: [id number]    Referring Physician(s): Dr. Rosalin Hawking  Supervising Physician: Luanne Bras  Patient Status: [facility] - In-pt  Chief Complaint: CVA  Subjective: Patient feels well this morning.  No complaints  Allergies: Latex; Ointment, white; and Tape  Medications: Prior to Admission medications   Medication Sig Start Date End Date Taking? Authorizing Provider  Acetaminophen (TYLENOL PO) Take 1-2 tablets by mouth every 6 (six) hours as needed (pain/headache).   Yes [provider]  aspirin EC 81 MG tablet Take 81 mg by mouth daily after supper.   Yes [provider]  Biotin 5000 MCG TABS Take 5,000 mcg by mouth daily.   Yes [provider]  budesonide-formoterol (SYMBICORT) 160-4.5 MCG/ACT inhaler Inhale 2 puffs into the lungs 2 (two) times daily. Patient taking differently: Inhale 2 puffs into the lungs 2 (two) times daily as needed (shortness of breath/wheezing).  08/02/17  Yes Tanda Rockers, MD  Choline Fenofibrate (TRILIPIX) 135 MG capsule Take 135 mg by mouth daily after supper.    Yes [provider]  ezetimibe (ZETIA) 10 MG tablet Take 10 mg by mouth daily after supper.    Yes [provider]  famotidine (PEPCID) 20 MG tablet Take 20 mg by mouth daily as needed for heartburn or indigestion.    Yes [provider]  Febuxostat (ULORIC) 80 MG TABS Take 80 mg by mouth daily after supper.   Yes [provider]  furosemide (LASIX) 20 MG tablet One daily as needed for swelling Patient taking differently: Take 20 mg by mouth every other day.  10/27/16  Yes Tanda Rockers, MD  GLUCOSAMINE-CHONDROITIN PO Take 1 tablet by mouth 2 (two) times daily. 1500-1200   Yes [provider]  levothyroxine (SYNTHROID, LEVOTHROID) 88 MCG tablet Take 88 mcg by mouth daily before breakfast.   Yes [provider]  Magnesium 250 MG TABS Take 250 mg by mouth  daily.   Yes [provider]  MELATONIN PO Take 1 tablet by mouth at bedtime as needed (sleep).    Yes [provider]  Multiple Vitamin (MULTIVITAMIN WITH MINERALS) TABS tablet Take 1 tablet by mouth daily.   Yes [provider]  Omega-3 Fatty Acids (FISH OIL) 1200 MG CAPS Take 2,400 mg by mouth 2 (two) times daily.   Yes [provider]  OXYGEN Inhale 2 L into the lungs as needed (for  shortness of breath with exertion only).    Yes [provider]  pregabalin (LYRICA) 50 MG capsule Take 50 mg by mouth 2 (two) times daily.   Yes [provider]  rosuvastatin (CRESTOR) 20 MG tablet Take 20 mg by mouth daily after supper.    Yes [provider]  terazosin (HYTRIN) 5 MG capsule Take 10 mg by mouth 2 (two) times daily. 08/30/17  Yes [provider]  tiotropium (SPIRIVA) 18 MCG inhalation capsule Take 18 mcg by mouth daily.   Yes [provider]  Turmeric 500 MG CAPS Take 500 mg by mouth daily.   Yes [provider]  vitamin B-12 (CYANOCOBALAMIN) 1000 MCG tablet Take 1,000 mcg by mouth daily.   Yes [provider]  albuterol (PROAIR HFA) 108 (90 Base) MCG/ACT inhaler Inhale 2 puffs into the lungs every 6 (six) hours as needed for wheezing or shortness of breath. Patient not taking: Reported on 09/11/2017 04/28/16   Tanda Rockers, MD  Vital Signs: BP (!) 123/97   Pulse 90   Temp (!) 97.4 F (36.3 C) (Oral)   Resp 20   Ht 5\' 9"  (1.753 m)   Wt 217 lb 9.5 oz (98.7 kg)   SpO2 96%   BMI 32.13 kg/m   Physical Exam: Abd: R CFA stick site with some ecchymosis but no hematoma.  Site well healed.    Imaging: Ct Angio Head W Or Wo Contrast  Result Date: 09/11/2017 CLINICAL DATA:  Stroke EXAM: CT ANGIOGRAPHY HEAD AND NECK CT PERFUSION BRAIN TECHNIQUE: Multidetector CT imaging of the head and neck was performed using the standard protocol during bolus administration of intravenous contrast. Multiplanar  CT image reconstructions and MIPs were obtained to evaluate the vascular anatomy. Carotid stenosis measurements (when applicable) are obtained utilizing NASCET criteria, using the distal internal carotid diameter as the denominator. Multiphase CT imaging of the brain was performed following IV bolus contrast injection. Subsequent parametric perfusion maps were calculated using RAPID software. CONTRAST:  100 mL Isovue 370 IV COMPARISON:  CT head 09/11/2017 FINDINGS: CTA NECK FINDINGS Aortic arch: Moderate atherosclerotic calcification in the aortic arch without aneurysm. Proximal great vessels diffusely diseased with calcification but patent. Right carotid system: Right common carotid artery widely patent with minimal atherosclerotic disease. Heavily calcified plaque at the right carotid bifurcation. 70% diameter stenosis right internal carotid artery Left carotid system: Scattered atherosclerotic plaque in the left common carotid artery without significant stenosis. Atherosclerotic calcification in the left carotid bulb 50% diameter stenosis left internal carotid artery Vertebral arteries: Right vertebral dominant. Large calcified plaque right vertebral artery origin. Proximal and mid right vertebral artery occluded. There is reconstitution of the distal right vertebral artery with minimal opacification. The right vertebral artery is diffusely calcified distally. Small left vertebral artery with moderate stenosis proximally. Left vertebral artery ends in PICA and does not contribute to the basilar. Skeleton: No acute skeletal abnormality. Other neck: No mass lesion in the neck. Upper chest: Apical emphysema. Bilateral small pleural effusions. Apical scarring bilaterally. Review of the MIP images confirms the above findings CTA HEAD FINDINGS Anterior circulation: Atherosclerotic calcification throughout the right internal carotid artery through the skull base and cavernous segment with moderate stenosis. Hypoplastic  right A1 segment. Right middle cerebral artery patent without stenosis Atherosclerotic calcification throughout the left internal carotid artery at the skullbase and throughout the left cavernous carotid with moderate stenosis. Both anterior cerebral arteries are supplied from the left and patent. Left middle cerebral artery patent without significant stenosis or occlusion. Posterior circulation: Severe atherosclerotic distal right vertebral artery which shows faint opacification. The right vertebral artery is occluded in the proximal and mid segment. The basilar is diffusely diseased but patent. There is a filling defect in the distal basilar extending into the left P1. Left posterior cerebral artery is patent. Right posterior cerebral artery is patent in the P1 segment and then occludes due to thrombus in the right P2 segment. Small left vertebral artery ends in PICA Venous sinuses: Patent Anatomic variants: None Delayed phase: Not performed Review of the MIP images confirms the above findings CT Brain Perfusion Findings: CBF (<30%) Volume: 19mL Perfusion (Tmax>6.0s) volume: 122mL Mismatch Volume: 160mL Infarction Location:None Delayed perfusion is seen throughout the brainstem and right cerebellum. This could be artifactual. There is also delayed perfusion in the watershed territory of both cerebral hemispheres, right greater than left IMPRESSION: 70% diameter stenosis right internal carotid artery due to heavily calcified plaque. Moderate stenosis right cavernous carotid. 50% diameter stenosis left  internal carotid artery due to heavily calcified plaque. Moderate stenosis left cavernous carotid Dominant right vertebral artery which is severely diseased and heavily calcified. The right vertebral artery is occluded in the proximal and midportion with faint reconstitution distally. The basilar is diffusely diseased. There is acute thrombus in the distal basilar. Left posterior cerebral artery is patent with thrombus  overlying the left PCA origin. There is occlusion of the right P2 segment due to clot. Left vertebral artery is small and ends in PICA Abnormal CT perfusion with multiple areas of delayed perfusion due to severe atherosclerotic disease. No infarct on CT perfusion. These results were called by telephone at the time of interpretation on 09/11/2017 at 7:10 pm to Dr. Rory Percy, who verbally acknowledged these results. Electronically Signed   By: Franchot Gallo M.D.   On: 09/11/2017 19:13   Dg Chest 2 View  Result Date: 09/11/2017 CLINICAL DATA:  Acute ischemic stroke. EXAM: CHEST  2 VIEW COMPARISON:  07/06/2016, 04/02/2016 FINDINGS: Sternotomy wires unchanged. Lungs are adequately inflated and demonstrate mild prominence of the perihilar markings likely vascular congestion/mild edema. Tiny amount of bilateral pleural fluid seen posteriorly. Several bilateral calcified granulomas unchanged. Mild cardiomegaly. Minimal calcified plaque over the thoracic aorta. Mild degenerate change of the spine. IMPRESSION: Mild cardiomegaly with mild vascular congestion/edema. Previous granulomas disease. Aortic Atherosclerosis (ICD10-I70.0). Electronically Signed   By: Marin Olp M.D.   On: 09/11/2017 20:45   Ct Angio Neck W Or Wo Contrast  Result Date: 09/11/2017 CLINICAL DATA:  Stroke EXAM: CT ANGIOGRAPHY HEAD AND NECK CT PERFUSION BRAIN TECHNIQUE: Multidetector CT imaging of the head and neck was performed using the standard protocol during bolus administration of intravenous contrast. Multiplanar CT image reconstructions and MIPs were obtained to evaluate the vascular anatomy. Carotid stenosis measurements (when applicable) are obtained utilizing NASCET criteria, using the distal internal carotid diameter as the denominator. Multiphase CT imaging of the brain was performed following IV bolus contrast injection. Subsequent parametric perfusion maps were calculated using RAPID software. CONTRAST:  100 mL Isovue 370 IV  COMPARISON:  CT head 09/11/2017 FINDINGS: CTA NECK FINDINGS Aortic arch: Moderate atherosclerotic calcification in the aortic arch without aneurysm. Proximal great vessels diffusely diseased with calcification but patent. Right carotid system: Right common carotid artery widely patent with minimal atherosclerotic disease. Heavily calcified plaque at the right carotid bifurcation. 70% diameter stenosis right internal carotid artery Left carotid system: Scattered atherosclerotic plaque in the left common carotid artery without significant stenosis. Atherosclerotic calcification in the left carotid bulb 50% diameter stenosis left internal carotid artery Vertebral arteries: Right vertebral dominant. Large calcified plaque right vertebral artery origin. Proximal and mid right vertebral artery occluded. There is reconstitution of the distal right vertebral artery with minimal opacification. The right vertebral artery is diffusely calcified distally. Small left vertebral artery with moderate stenosis proximally. Left vertebral artery ends in PICA and does not contribute to the basilar. Skeleton: No acute skeletal abnormality. Other neck: No mass lesion in the neck. Upper chest: Apical emphysema. Bilateral small pleural effusions. Apical scarring bilaterally. Review of the MIP images confirms the above findings CTA HEAD FINDINGS Anterior circulation: Atherosclerotic calcification throughout the right internal carotid artery through the skull base and cavernous segment with moderate stenosis. Hypoplastic right A1 segment. Right middle cerebral artery patent without stenosis Atherosclerotic calcification throughout the left internal carotid artery at the skullbase and throughout the left cavernous carotid with moderate stenosis. Both anterior cerebral arteries are supplied from the left and patent. Left middle cerebral artery  patent without significant stenosis or occlusion. Posterior circulation: Severe atherosclerotic  distal right vertebral artery which shows faint opacification. The right vertebral artery is occluded in the proximal and mid segment. The basilar is diffusely diseased but patent. There is a filling defect in the distal basilar extending into the left P1. Left posterior cerebral artery is patent. Right posterior cerebral artery is patent in the P1 segment and then occludes due to thrombus in the right P2 segment. Small left vertebral artery ends in PICA Venous sinuses: Patent Anatomic variants: None Delayed phase: Not performed Review of the MIP images confirms the above findings CT Brain Perfusion Findings: CBF (<30%) Volume: 75mL Perfusion (Tmax>6.0s) volume: 160mL Mismatch Volume: 179mL Infarction Location:None Delayed perfusion is seen throughout the brainstem and right cerebellum. This could be artifactual. There is also delayed perfusion in the watershed territory of both cerebral hemispheres, right greater than left IMPRESSION: 70% diameter stenosis right internal carotid artery due to heavily calcified plaque. Moderate stenosis right cavernous carotid. 50% diameter stenosis left internal carotid artery due to heavily calcified plaque. Moderate stenosis left cavernous carotid Dominant right vertebral artery which is severely diseased and heavily calcified. The right vertebral artery is occluded in the proximal and midportion with faint reconstitution distally. The basilar is diffusely diseased. There is acute thrombus in the distal basilar. Left posterior cerebral artery is patent with thrombus overlying the left PCA origin. There is occlusion of the right P2 segment due to clot. Left vertebral artery is small and ends in PICA Abnormal CT perfusion with multiple areas of delayed perfusion due to severe atherosclerotic disease. No infarct on CT perfusion. These results were called by telephone at the time of interpretation on 09/11/2017 at 7:10 pm to Dr. Rory Percy, who verbally acknowledged these results.  Electronically Signed   By: Franchot Gallo M.D.   On: 09/11/2017 19:13   Mr Brain Wo Contrast  Result Date: 09/12/2017 CLINICAL DATA:  Stroke follow-up.  Post tPA EXAM: MRI HEAD WITHOUT CONTRAST TECHNIQUE: Multiplanar, multiecho pulse sequences of the brain and surrounding structures were obtained without intravenous contrast. COMPARISON:  MRI and CTA 09/11/2017 FINDINGS: Brain: Negative for acute infarct. No significant chronic ischemia. Mild atrophy. Negative for hemorrhage, mass, or fluid collection Vascular: Normal arterial flow voids. Skull and upper cervical spine: Negative Sinuses/Orbits: Bilateral cataract removal. Paranasal sinuses have minimal mucosal edema Other: None IMPRESSION: Negative for acute infarct or hemorrhage. Electronically Signed   By: Franchot Gallo M.D.   On: 09/12/2017 18:01   Ct Cerebral Perfusion W Contrast  Result Date: 09/11/2017 CLINICAL DATA:  Stroke EXAM: CT ANGIOGRAPHY HEAD AND NECK CT PERFUSION BRAIN TECHNIQUE: Multidetector CT imaging of the head and neck was performed using the standard protocol during bolus administration of intravenous contrast. Multiplanar CT image reconstructions and MIPs were obtained to evaluate the vascular anatomy. Carotid stenosis measurements (when applicable) are obtained utilizing NASCET criteria, using the distal internal carotid diameter as the denominator. Multiphase CT imaging of the brain was performed following IV bolus contrast injection. Subsequent parametric perfusion maps were calculated using RAPID software. CONTRAST:  100 mL Isovue 370 IV COMPARISON:  CT head 09/11/2017 FINDINGS: CTA NECK FINDINGS Aortic arch: Moderate atherosclerotic calcification in the aortic arch without aneurysm. Proximal great vessels diffusely diseased with calcification but patent. Right carotid system: Right common carotid artery widely patent with minimal atherosclerotic disease. Heavily calcified plaque at the right carotid bifurcation. 70% diameter  stenosis right internal carotid artery Left carotid system: Scattered atherosclerotic plaque in the left common  carotid artery without significant stenosis. Atherosclerotic calcification in the left carotid bulb 50% diameter stenosis left internal carotid artery Vertebral arteries: Right vertebral dominant. Large calcified plaque right vertebral artery origin. Proximal and mid right vertebral artery occluded. There is reconstitution of the distal right vertebral artery with minimal opacification. The right vertebral artery is diffusely calcified distally. Small left vertebral artery with moderate stenosis proximally. Left vertebral artery ends in PICA and does not contribute to the basilar. Skeleton: No acute skeletal abnormality. Other neck: No mass lesion in the neck. Upper chest: Apical emphysema. Bilateral small pleural effusions. Apical scarring bilaterally. Review of the MIP images confirms the above findings CTA HEAD FINDINGS Anterior circulation: Atherosclerotic calcification throughout the right internal carotid artery through the skull base and cavernous segment with moderate stenosis. Hypoplastic right A1 segment. Right middle cerebral artery patent without stenosis Atherosclerotic calcification throughout the left internal carotid artery at the skullbase and throughout the left cavernous carotid with moderate stenosis. Both anterior cerebral arteries are supplied from the left and patent. Left middle cerebral artery patent without significant stenosis or occlusion. Posterior circulation: Severe atherosclerotic distal right vertebral artery which shows faint opacification. The right vertebral artery is occluded in the proximal and mid segment. The basilar is diffusely diseased but patent. There is a filling defect in the distal basilar extending into the left P1. Left posterior cerebral artery is patent. Right posterior cerebral artery is patent in the P1 segment and then occludes due to thrombus in the  right P2 segment. Small left vertebral artery ends in PICA Venous sinuses: Patent Anatomic variants: None Delayed phase: Not performed Review of the MIP images confirms the above findings CT Brain Perfusion Findings: CBF (<30%) Volume: 69mL Perfusion (Tmax>6.0s) volume: 141mL Mismatch Volume: 167mL Infarction Location:None Delayed perfusion is seen throughout the brainstem and right cerebellum. This could be artifactual. There is also delayed perfusion in the watershed territory of both cerebral hemispheres, right greater than left IMPRESSION: 70% diameter stenosis right internal carotid artery due to heavily calcified plaque. Moderate stenosis right cavernous carotid. 50% diameter stenosis left internal carotid artery due to heavily calcified plaque. Moderate stenosis left cavernous carotid Dominant right vertebral artery which is severely diseased and heavily calcified. The right vertebral artery is occluded in the proximal and midportion with faint reconstitution distally. The basilar is diffusely diseased. There is acute thrombus in the distal basilar. Left posterior cerebral artery is patent with thrombus overlying the left PCA origin. There is occlusion of the right P2 segment due to clot. Left vertebral artery is small and ends in PICA Abnormal CT perfusion with multiple areas of delayed perfusion due to severe atherosclerotic disease. No infarct on CT perfusion. These results were called by telephone at the time of interpretation on 09/11/2017 at 7:10 pm to Dr. Rory Percy, who verbally acknowledged these results. Electronically Signed   By: Franchot Gallo M.D.   On: 09/11/2017 19:13   Mr Brain Limited Wo Contrast  Result Date: 09/11/2017 CLINICAL DATA:  Basilar occlusion.  Stroke. EXAM: MRI HEAD WITHOUT CONTRAST TECHNIQUE: Multiplanar, multiecho pulse sequences of the brain and surrounding structures were obtained without intravenous contrast. COMPARISON:  CT and CT perfusion 09/11/2017 FINDINGS: Brain: Limited  sequences. Diffusion and axial FLAIR imaging only was performed. Negative for acute infarct. Generalized atrophy. Minimal chronic ischemic change in the white matter. IMPRESSION: Negative for acute infarct on limited imaging. These results were called by telephone at the time of interpretation on 09/11/2017 at 7:28 pm to Dr. Amie Portland ,  who verbally acknowledged these results. Electronically Signed   By: Franchot Gallo M.D.   On: 09/11/2017 19:28   Ct Head Code Stroke Wo Contrast  Result Date: 09/11/2017 CLINICAL DATA:  Code stroke. Focal neuro deficit less than 6 hours. Right-sided weakness EXAM: CT HEAD WITHOUT CONTRAST TECHNIQUE: Contiguous axial images were obtained from the base of the skull through the vertex without intravenous contrast. COMPARISON:  MRI 01/17/2015 FINDINGS: Brain: Generalized atrophy. Negative for hydrocephalus. Negative for acute infarct, hemorrhage, mass lesion. Vascular: Extensive calcification in the right vertebral artery and basilar. Calcification also in the cavernous carotid bilaterally. Hyperdensity distal basilar suggestive of acute thrombosis. Skull: Negative Sinuses/Orbits: Mild mucosal edema paranasal sinuses. Bilateral cataract removal. Other: None ASPECTS ([place] Stroke Program Early CT Score) - Ganglionic level infarction (caudate, lentiform nuclei, internal capsule, insula, M1-M3 cortex): 7 - Supraganglionic infarction (M4-M6 cortex): 3 Total score (0-10 with 10 being normal): 10 IMPRESSION: 1. No acute infarct. 2. Hyperdense distal basilar suggestive of acute thrombosis. 3. ASPECTS is 10 4. These results were called by telephone at the time of interpretation on 09/11/2017 at 6:18 pm to Dr. Rory Percy, who verbally acknowledged these results. Electronically Signed   By: Franchot Gallo M.D.   On: 09/11/2017 18:21    Labs:  CBC:  Recent Labs  09/11/17 1755 09/11/17 1801 09/13/17 0154  WBC 5.1  --  5.2  HGB 12.8* 12.6* 11.7*  HCT 38.8* 37.0* 36.1*  PLT 112*  --   99*    COAGS:  Recent Labs  09/11/17 1755  INR 1.04  APTT 30    BMP:  Recent Labs  09/11/17 1755 09/11/17 1801 09/13/17 0154  NA 138 140 137  K 4.1 4.1 3.9  CL 107 104 109  CO2 24  --  22  GLUCOSE 93 91 93  BUN 33* 34* 26*  CALCIUM 9.1  --  8.1*  CREATININE 2.16* 2.20* 1.89*  GFRNONAA 27*  --  32*  GFRAA 31*  --  37*    LIVER FUNCTION TESTS:  Recent Labs  09/11/17 1755  BILITOT 0.7  AST 29  ALT 18  ALKPHOS 28*  PROT 6.0*  ALBUMIN 3.7    Assessment and Plan: 1. 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  The patient would certainly need cardiac clearance prior to any type of intervention as he is very bradycardic and GETA is a big deal for cardiopulmonary systems. He has been started on eliquis for his a fib. He can follow up with Dr. Estanislado Pandy as an outpatient.  Patient is not convinced he would want to proceed with intervention were he cleared by cardiology.  Electronically Signed: [name] 09/13/2017, 12:38 PM   I spent a total of 15 Minutes at the the patient's bedside AND on the patient's hospital floor or unit, greater than 50% of which was counseling/coordinating care for CVA

## 2017-09-13 NOTE — Progress Notes (Signed)
09/13/17 1255  PT Evaluation  Last PT Received On 09/13/17  Assistance Needed +1  History of Present Illness 82 y.o. male admitted on 09/11/17 with AMS, R sided weakness, CT revealed a heavily calcified right vertebral artery and basilar artery on the noncontrast CT of the head, and also had a suspicious looking finding which was concerning for thrombus on the distal basilar.  IV tPA was given.  EKG was also given and revealed A-fib with slow ventricular rate (HR was 30s-40s in CT scanner).  Pt also went to IR for S/P bilateral common carotid artery and RT subclavian arteriograms on 09/12/17.  Pt with significant PMH of SOB, MI, HTN, CAD, COPD, AAA, arthritis s/p L knee arthroscopy, CABG.    Precautions  Precautions Fall  Precaution Comments mildly unsteady on his feet  Home Living  Family/patient expects to be discharged to: Private residence  Living Arrangements Alone  Available Help at Discharge Other (Comment) (girlfriend lives in the same building and is there all the t)  Type of Home Apartment  Home Access Level entry  Home Layout One level  Bathroom Shower/Tub Tub/shower unit  [place] - single point;Grab bars - tub/shower;Other (comment) (PRN O2 for home use and rescue inhaler)  Prior Function  Level of Independence Independent  Comments Pt admitts to cruising on furniture at times for balance, but no recent h/o falls.  He also reports L leg sciatica and h/o vertigo  Communication  Communication No difficulties  Pain Assessment  Pain Assessment No/denies pain  Cognition  Arousal/Alertness Awake/alert  Behavior During Therapy WFL for tasks assessed/performed  Overall Cognitive Status History of cognitive impairments - at baseline  General Comments memory deficits at baseline.    Upper Extremity Assessment  Upper Extremity Assessment Defer to OT evaluation  Lower Extremity Assessment  Lower Extremity Assessment LLE deficits/detail  LLE Deficits / Details left leg is  genenerally weaker (not by much) 4/5 per seated MMT.  Pt reports he has h/o sciatica on this side and per chart, his right side was weak when he came into the hospital.   Cervical / Trunk Assessment  Cervical / Trunk Assessment Other exceptions  Cervical / Trunk Exceptions pt with h/o sciatica on the left.   Bed Mobility  General bed mobility comments Pt was OOB in the recliner chair.   Transfers  Overall transfer level Needs assistance  Equipment used Rolling walker (2 wheeled)  Transfers Sit to/from Stand  Sit to Stand Min guard  General transfer comment Min guard assist for safety during transitions.   Ambulation/Gait  Ambulation/Gait assistance Min assist  Ambulation Distance (Feet) 120 Feet  Assistive device None  Gait Pattern/deviations Step-through pattern;Decreased dorsiflexion - left;Decreased stance time - left;Staggering right;Staggering left  General Gait Details Pt with seemingly decreased strength and stability over his left leg.  He walks very fast with seemingly no awareness of his imbalance during gait.  1-2 LOB requiring min assist to catch his balance, especially while turning.  Pt admits to using furniture at times at home to steady himself, but no reports of recent falls.  He reports that his time in the bed may be causing his increased balance issues.  He was wearing his shoes during gait.   Gait velocity too fast to be safe  Gait velocity interpretation at or above normal speed for age/gender  Modified Rankin (Stroke Patients Only)  Pre-Morbid Rankin Score 0  Modified Rankin 4  Balance  Overall balance assessment Needs assistance  Sitting-balance  support Feet supported;No upper extremity supported  Sitting balance-Leahy Scale Good  Sitting balance - Comments Pt able to donn his shoes by bending forward at chair level.    Standing balance support No upper extremity supported  Standing balance-Leahy Scale Good  Standing balance comment Balance deficits evident when  challenged.   PT - End of Session  Equipment Utilized During Treatment Gait belt  Activity Tolerance Other (comment) (limited by 2/4 DOE and left leg fatigue/pain)  Patient left in chair;with call bell/phone within reach;with chair alarm set;with family/visitor present  Nurse Communication Mobility status  PT Assessment  PT Recommendation/Assessment Patient needs continued PT services  PT Visit Diagnosis Unsteadiness on feet (R26.81);Other symptoms and signs involving the nervous system (R29.898)  PT Problem List Decreased strength;Decreased activity tolerance;Decreased balance;Decreased mobility;Decreased knowledge of precautions;Cardiopulmonary status limiting activity;Pain  PT Plan  PT Frequency (ACUTE ONLY) Min 4X/week  PT Treatment/Interventions (ACUTE ONLY) DME instruction;Gait training;Stair training;Functional mobility training;Therapeutic activities;Therapeutic exercise;Balance training;Neuromuscular re-education;Patient/family education  AM-PAC PT "6 Clicks" Daily Activity Outcome Measure  Difficulty turning over in bed (including adjusting bedclothes, sheets and blankets)? 4  Difficulty moving from lying on back to sitting on the side of the bed?  4  Difficulty sitting down on and standing up from a chair with arms (e.g., wheelchair, bedside commode, etc,.)? 1  Help needed moving to and from a bed to chair (including a wheelchair)? 3  Help needed walking in hospital room? 3  Help needed climbing 3-5 steps with a railing?  3  6 Click Score 18  Mobility G Code  CK  PT Recommendation  Follow Up Recommendations Outpatient PT;Supervision for mobility/OOB  PT equipment None recommended by PT  Individuals Consulted  Consulted and Agree with Results and Recommendations Patient  Acute Rehab PT Goals  Patient Stated Goal to go home soon  PT Goal Formulation With patient  Time For Goal Achievement 09/27/17  Potential to Achieve Goals Good  PT Time Calculation  PT Start Time (ACUTE  ONLY) 1218  PT Stop Time (ACUTE ONLY) 1300  PT Time Calculation (min) (ACUTE ONLY) 42 min  PT General Charges  $$ ACUTE PT VISIT 1 Visit  PT Evaluation  $PT Eval Moderate Complexity 1 Mod  PT Treatments  $Gait Training 8-22 mins  $Therapeutic Activity 8-22 mins  Written Expression  Dominant Hand Right  09/13/2017 Pt unsteady during gait, pt reporting his left leg sciatica and being in the bed as why he was unsteady on his feet.  DOE 2/4 with HR ranging from 40s-120s at rest and during gait.  Pt in A-fib.  Pt would benefit from OP PT f/u for balance and gait training as well as exercise prescription as he wants to start a walking program when it is safe to do so with his girlfriend.   PT to follow acutely for deficits listed above.  Barbarann Ehlers [place], [place], DPT [phone]

## 2017-09-13 NOTE — Progress Notes (Addendum)
Patient came from 4N alert and oriented X 4, no pain able to ambulate with minimal assistance he received TPA. Telemetry is on and he is able to move around in room ad lib so SCD's are off, will continue to monitor.

## 2017-09-13 NOTE — Progress Notes (Addendum)
Progress Note  Patient Name: Derek [name] Date of Encounter: 09/13/2017  Primary Cardiologist: Tamala Julian  Subjective   Feeling well this morning.   Inpatient Medications    Scheduled Meds: . apixaban  2.5 mg Oral BID  . aspirin EC  81 mg Oral Daily  . ezetimibe  10 mg Oral Daily  . famotidine  20 mg Oral Daily  . Influenza vac split quadrivalent PF  0.5 mL Intramuscular Tomorrow-1000  . levothyroxine  88 mcg Oral QAC breakfast  . pregabalin  50 mg Oral BID  . rosuvastatin  20 mg Oral q1800   Continuous Infusions: . niCARDipine Stopped (09/11/17 1957)   PRN Meds: acetaminophen **OR** [DISCONTINUED] acetaminophen (TYLENOL) oral liquid 160 mg/5 mL **OR** [DISCONTINUED] acetaminophen, senna-docusate   Vital Signs    Vitals:   09/13/17 0700 09/13/17 0900 09/13/17 1000 09/13/17 1100  BP: (!) 120/50 (!) 132/47 (!) 132/57 (!) 123/97  Pulse: (!) 49 (!) 50 (!) 48 90  Resp: 16 16 18 20   Temp:      TempSrc:      SpO2: 96% 100% 98% 96%  Weight:      Height:        Intake/Output Summary (Last 24 hours) at 09/13/17 1204 Last data filed at 09/13/17 0600  Gross per 24 hour  Intake              540 ml  Output             1650 ml  Net            -1110 ml   Filed Weights   09/11/17 2151  Weight: 217 lb 9.5 oz (98.7 kg)    Telemetry    Afib (rates upper 30s- 50s) - Personally Reviewed  Physical Exam   General: Well developed, well nourished, male appearing in no acute distress. Head: Normocephalic, atraumatic.  Neck: Supple no JVD. Lungs:  Resp regular and unlabored, CTA. Heart: Irreg Irreg, S1, S2, no S3, S4, or murmur; no rub. Abdomen: Soft, non-tender, non-distended with normoactive bowel sounds.  Extremities: No clubbing, cyanosis, edema. Distal pedal pulses are 2+ bilaterally. Neuro: Alert and oriented X 3. Moves all extremities spontaneously. Psych: Normal affect.  Labs    Chemistry Recent Labs Lab 09/11/17 1755 09/11/17 1801 09/13/17 0154  NA 138 140  137  K 4.1 4.1 3.9  CL 107 104 109  CO2 24  --  22  GLUCOSE 93 91 93  BUN 33* 34* 26*  CREATININE 2.16* 2.20* 1.89*  CALCIUM 9.1  --  8.1*  PROT 6.0*  --   --   ALBUMIN 3.7  --   --   AST 29  --   --   ALT 18  --   --   ALKPHOS 28*  --   --   BILITOT 0.7  --   --   GFRNONAA 27*  --  32*  GFRAA 31*  --  37*  ANIONGAP 7  --  6     Hematology Recent Labs Lab 09/11/17 1755 09/11/17 1801 09/13/17 0154  WBC 5.1  --  5.2  RBC 4.11*  --  3.80*  HGB 12.8* 12.6* 11.7*  HCT 38.8* 37.0* 36.1*  MCV 94.4  --  95.0  MCH 31.1  --  30.8  MCHC 33.0  --  32.4  RDW 14.9  --  14.8  PLT 112*  --  99*    Cardiac EnzymesNo results for input(s): TROPONINI in the last  168 hours.  Recent Labs Lab 09/11/17 1759  TROPIPOC 0.02     BNPNo results for input(s): BNP, PROBNP in the last 168 hours.   DDimer No results for input(s): DDIMER in the last 168 hours.    Radiology    Ct Angio Head W Or Wo Contrast  Result Date: 09/11/2017 CLINICAL DATA:  Stroke EXAM: CT ANGIOGRAPHY HEAD AND NECK CT PERFUSION BRAIN TECHNIQUE: Multidetector CT imaging of the head and neck was performed using the standard protocol during bolus administration of intravenous contrast. Multiplanar CT image reconstructions and MIPs were obtained to evaluate the vascular anatomy. Carotid stenosis measurements (when applicable) are obtained utilizing NASCET criteria, using the distal internal carotid diameter as the denominator. Multiphase CT imaging of the brain was performed following IV bolus contrast injection. Subsequent parametric perfusion maps were calculated using RAPID software. CONTRAST:  100 mL Isovue 370 IV COMPARISON:  CT head 09/11/2017 FINDINGS: CTA NECK FINDINGS Aortic arch: Moderate atherosclerotic calcification in the aortic arch without aneurysm. Proximal great vessels diffusely diseased with calcification but patent. Right carotid system: Right common carotid artery widely patent with minimal atherosclerotic  disease. Heavily calcified plaque at the right carotid bifurcation. 70% diameter stenosis right internal carotid artery Left carotid system: Scattered atherosclerotic plaque in the left common carotid artery without significant stenosis. Atherosclerotic calcification in the left carotid bulb 50% diameter stenosis left internal carotid artery Vertebral arteries: Right vertebral dominant. Large calcified plaque right vertebral artery origin. Proximal and mid right vertebral artery occluded. There is reconstitution of the distal right vertebral artery with minimal opacification. The right vertebral artery is diffusely calcified distally. Small left vertebral artery with moderate stenosis proximally. Left vertebral artery ends in PICA and does not contribute to the basilar. Skeleton: No acute skeletal abnormality. Other neck: No mass lesion in the neck. Upper chest: Apical emphysema. Bilateral small pleural effusions. Apical scarring bilaterally. Review of the MIP images confirms the above findings CTA HEAD FINDINGS Anterior circulation: Atherosclerotic calcification throughout the right internal carotid artery through the skull base and cavernous segment with moderate stenosis. Hypoplastic right A1 segment. Right middle cerebral artery patent without stenosis Atherosclerotic calcification throughout the left internal carotid artery at the skullbase and throughout the left cavernous carotid with moderate stenosis. Both anterior cerebral arteries are supplied from the left and patent. Left middle cerebral artery patent without significant stenosis or occlusion. Posterior circulation: Severe atherosclerotic distal right vertebral artery which shows faint opacification. The right vertebral artery is occluded in the proximal and mid segment. The basilar is diffusely diseased but patent. There is a filling defect in the distal basilar extending into the left P1. Left posterior cerebral artery is patent. Right posterior  cerebral artery is patent in the P1 segment and then occludes due to thrombus in the right P2 segment. Small left vertebral artery ends in PICA Venous sinuses: Patent Anatomic variants: None Delayed phase: Not performed Review of the MIP images confirms the above findings CT Brain Perfusion Findings: CBF (<30%) Volume: 105mL Perfusion (Tmax>6.0s) volume: 156mL Mismatch Volume: 137mL Infarction Location:None Delayed perfusion is seen throughout the brainstem and right cerebellum. This could be artifactual. There is also delayed perfusion in the watershed territory of both cerebral hemispheres, right greater than left IMPRESSION: 70% diameter stenosis right internal carotid artery due to heavily calcified plaque. Moderate stenosis right cavernous carotid. 50% diameter stenosis left internal carotid artery due to heavily calcified plaque. Moderate stenosis left cavernous carotid Dominant right vertebral artery which is severely diseased and heavily calcified.  The right vertebral artery is occluded in the proximal and midportion with faint reconstitution distally. The basilar is diffusely diseased. There is acute thrombus in the distal basilar. Left posterior cerebral artery is patent with thrombus overlying the left PCA origin. There is occlusion of the right P2 segment due to clot. Left vertebral artery is small and ends in PICA Abnormal CT perfusion with multiple areas of delayed perfusion due to severe atherosclerotic disease. No infarct on CT perfusion. These results were called by telephone at the time of interpretation on 09/11/2017 at 7:10 pm to Dr. Rory Percy, who verbally acknowledged these results. Electronically Signed   By: Franchot Gallo M.D.   On: 09/11/2017 19:13   Dg Chest 2 View  Result Date: 09/11/2017 CLINICAL DATA:  Acute ischemic stroke. EXAM: CHEST  2 VIEW COMPARISON:  07/06/2016, 04/02/2016 FINDINGS: Sternotomy wires unchanged. Lungs are adequately inflated and demonstrate mild prominence of the  perihilar markings likely vascular congestion/mild edema. Tiny amount of bilateral pleural fluid seen posteriorly. Several bilateral calcified granulomas unchanged. Mild cardiomegaly. Minimal calcified plaque over the thoracic aorta. Mild degenerate change of the spine. IMPRESSION: Mild cardiomegaly with mild vascular congestion/edema. Previous granulomas disease. Aortic Atherosclerosis (ICD10-I70.0). Electronically Signed   By: Marin Olp M.D.   On: 09/11/2017 20:45   Ct Angio Neck W Or Wo Contrast  Result Date: 09/11/2017 CLINICAL DATA:  Stroke EXAM: CT ANGIOGRAPHY HEAD AND NECK CT PERFUSION BRAIN TECHNIQUE: Multidetector CT imaging of the head and neck was performed using the standard protocol during bolus administration of intravenous contrast. Multiplanar CT image reconstructions and MIPs were obtained to evaluate the vascular anatomy. Carotid stenosis measurements (when applicable) are obtained utilizing NASCET criteria, using the distal internal carotid diameter as the denominator. Multiphase CT imaging of the brain was performed following IV bolus contrast injection. Subsequent parametric perfusion maps were calculated using RAPID software. CONTRAST:  100 mL Isovue 370 IV COMPARISON:  CT head 09/11/2017 FINDINGS: CTA NECK FINDINGS Aortic arch: Moderate atherosclerotic calcification in the aortic arch without aneurysm. Proximal great vessels diffusely diseased with calcification but patent. Right carotid system: Right common carotid artery widely patent with minimal atherosclerotic disease. Heavily calcified plaque at the right carotid bifurcation. 70% diameter stenosis right internal carotid artery Left carotid system: Scattered atherosclerotic plaque in the left common carotid artery without significant stenosis. Atherosclerotic calcification in the left carotid bulb 50% diameter stenosis left internal carotid artery Vertebral arteries: Right vertebral dominant. Large calcified plaque right vertebral  artery origin. Proximal and mid right vertebral artery occluded. There is reconstitution of the distal right vertebral artery with minimal opacification. The right vertebral artery is diffusely calcified distally. Small left vertebral artery with moderate stenosis proximally. Left vertebral artery ends in PICA and does not contribute to the basilar. Skeleton: No acute skeletal abnormality. Other neck: No mass lesion in the neck. Upper chest: Apical emphysema. Bilateral small pleural effusions. Apical scarring bilaterally. Review of the MIP images confirms the above findings CTA HEAD FINDINGS Anterior circulation: Atherosclerotic calcification throughout the right internal carotid artery through the skull base and cavernous segment with moderate stenosis. Hypoplastic right A1 segment. Right middle cerebral artery patent without stenosis Atherosclerotic calcification throughout the left internal carotid artery at the skullbase and throughout the left cavernous carotid with moderate stenosis. Both anterior cerebral arteries are supplied from the left and patent. Left middle cerebral artery patent without significant stenosis or occlusion. Posterior circulation: Severe atherosclerotic distal right vertebral artery which shows faint opacification. The right vertebral artery is occluded  in the proximal and mid segment. The basilar is diffusely diseased but patent. There is a filling defect in the distal basilar extending into the left P1. Left posterior cerebral artery is patent. Right posterior cerebral artery is patent in the P1 segment and then occludes due to thrombus in the right P2 segment. Small left vertebral artery ends in PICA Venous sinuses: Patent Anatomic variants: None Delayed phase: Not performed Review of the MIP images confirms the above findings CT Brain Perfusion Findings: CBF (<30%) Volume: 35mL Perfusion (Tmax>6.0s) volume: 137mL Mismatch Volume: 192mL Infarction Location:None Delayed perfusion is seen  throughout the brainstem and right cerebellum. This could be artifactual. There is also delayed perfusion in the watershed territory of both cerebral hemispheres, right greater than left IMPRESSION: 70% diameter stenosis right internal carotid artery due to heavily calcified plaque. Moderate stenosis right cavernous carotid. 50% diameter stenosis left internal carotid artery due to heavily calcified plaque. Moderate stenosis left cavernous carotid Dominant right vertebral artery which is severely diseased and heavily calcified. The right vertebral artery is occluded in the proximal and midportion with faint reconstitution distally. The basilar is diffusely diseased. There is acute thrombus in the distal basilar. Left posterior cerebral artery is patent with thrombus overlying the left PCA origin. There is occlusion of the right P2 segment due to clot. Left vertebral artery is small and ends in PICA Abnormal CT perfusion with multiple areas of delayed perfusion due to severe atherosclerotic disease. No infarct on CT perfusion. These results were called by telephone at the time of interpretation on 09/11/2017 at 7:10 pm to Dr. Rory Percy, who verbally acknowledged these results. Electronically Signed   By: Franchot Gallo M.D.   On: 09/11/2017 19:13   Mr Brain Wo Contrast  Result Date: 09/12/2017 CLINICAL DATA:  Stroke follow-up.  Post tPA EXAM: MRI HEAD WITHOUT CONTRAST TECHNIQUE: Multiplanar, multiecho pulse sequences of the brain and surrounding structures were obtained without intravenous contrast. COMPARISON:  MRI and CTA 09/11/2017 FINDINGS: Brain: Negative for acute infarct. No significant chronic ischemia. Mild atrophy. Negative for hemorrhage, mass, or fluid collection Vascular: Normal arterial flow voids. Skull and upper cervical spine: Negative Sinuses/Orbits: Bilateral cataract removal. Paranasal sinuses have minimal mucosal edema Other: None IMPRESSION: Negative for acute infarct or hemorrhage.  Electronically Signed   By: Franchot Gallo M.D.   On: 09/12/2017 18:01   Ct Cerebral Perfusion W Contrast  Result Date: 09/11/2017 CLINICAL DATA:  Stroke EXAM: CT ANGIOGRAPHY HEAD AND NECK CT PERFUSION BRAIN TECHNIQUE: Multidetector CT imaging of the head and neck was performed using the standard protocol during bolus administration of intravenous contrast. Multiplanar CT image reconstructions and MIPs were obtained to evaluate the vascular anatomy. Carotid stenosis measurements (when applicable) are obtained utilizing NASCET criteria, using the distal internal carotid diameter as the denominator. Multiphase CT imaging of the brain was performed following IV bolus contrast injection. Subsequent parametric perfusion maps were calculated using RAPID software. CONTRAST:  100 mL Isovue 370 IV COMPARISON:  CT head 09/11/2017 FINDINGS: CTA NECK FINDINGS Aortic arch: Moderate atherosclerotic calcification in the aortic arch without aneurysm. Proximal great vessels diffusely diseased with calcification but patent. Right carotid system: Right common carotid artery widely patent with minimal atherosclerotic disease. Heavily calcified plaque at the right carotid bifurcation. 70% diameter stenosis right internal carotid artery Left carotid system: Scattered atherosclerotic plaque in the left common carotid artery without significant stenosis. Atherosclerotic calcification in the left carotid bulb 50% diameter stenosis left internal carotid artery Vertebral arteries: Right vertebral dominant. Large  calcified plaque right vertebral artery origin. Proximal and mid right vertebral artery occluded. There is reconstitution of the distal right vertebral artery with minimal opacification. The right vertebral artery is diffusely calcified distally. Small left vertebral artery with moderate stenosis proximally. Left vertebral artery ends in PICA and does not contribute to the basilar. Skeleton: No acute skeletal abnormality. Other  neck: No mass lesion in the neck. Upper chest: Apical emphysema. Bilateral small pleural effusions. Apical scarring bilaterally. Review of the MIP images confirms the above findings CTA HEAD FINDINGS Anterior circulation: Atherosclerotic calcification throughout the right internal carotid artery through the skull base and cavernous segment with moderate stenosis. Hypoplastic right A1 segment. Right middle cerebral artery patent without stenosis Atherosclerotic calcification throughout the left internal carotid artery at the skullbase and throughout the left cavernous carotid with moderate stenosis. Both anterior cerebral arteries are supplied from the left and patent. Left middle cerebral artery patent without significant stenosis or occlusion. Posterior circulation: Severe atherosclerotic distal right vertebral artery which shows faint opacification. The right vertebral artery is occluded in the proximal and mid segment. The basilar is diffusely diseased but patent. There is a filling defect in the distal basilar extending into the left P1. Left posterior cerebral artery is patent. Right posterior cerebral artery is patent in the P1 segment and then occludes due to thrombus in the right P2 segment. Small left vertebral artery ends in PICA Venous sinuses: Patent Anatomic variants: None Delayed phase: Not performed Review of the MIP images confirms the above findings CT Brain Perfusion Findings: CBF (<30%) Volume: 69mL Perfusion (Tmax>6.0s) volume: 158mL Mismatch Volume: 183mL Infarction Location:None Delayed perfusion is seen throughout the brainstem and right cerebellum. This could be artifactual. There is also delayed perfusion in the watershed territory of both cerebral hemispheres, right greater than left IMPRESSION: 70% diameter stenosis right internal carotid artery due to heavily calcified plaque. Moderate stenosis right cavernous carotid. 50% diameter stenosis left internal carotid artery due to heavily  calcified plaque. Moderate stenosis left cavernous carotid Dominant right vertebral artery which is severely diseased and heavily calcified. The right vertebral artery is occluded in the proximal and midportion with faint reconstitution distally. The basilar is diffusely diseased. There is acute thrombus in the distal basilar. Left posterior cerebral artery is patent with thrombus overlying the left PCA origin. There is occlusion of the right P2 segment due to clot. Left vertebral artery is small and ends in PICA Abnormal CT perfusion with multiple areas of delayed perfusion due to severe atherosclerotic disease. No infarct on CT perfusion. These results were called by telephone at the time of interpretation on 09/11/2017 at 7:10 pm to Dr. Rory Percy, who verbally acknowledged these results. Electronically Signed   By: Franchot Gallo M.D.   On: 09/11/2017 19:13   Mr Brain Limited Wo Contrast  Result Date: 09/11/2017 CLINICAL DATA:  Basilar occlusion.  Stroke. EXAM: MRI HEAD WITHOUT CONTRAST TECHNIQUE: Multiplanar, multiecho pulse sequences of the brain and surrounding structures were obtained without intravenous contrast. COMPARISON:  CT and CT perfusion 09/11/2017 FINDINGS: Brain: Limited sequences. Diffusion and axial FLAIR imaging only was performed. Negative for acute infarct. Generalized atrophy. Minimal chronic ischemic change in the white matter. IMPRESSION: Negative for acute infarct on limited imaging. These results were called by telephone at the time of interpretation on 09/11/2017 at 7:28 pm to Dr. Amie Portland , who verbally acknowledged these results. Electronically Signed   By: Franchot Gallo M.D.   On: 09/11/2017 19:28   Ct Head Code Stroke  Wo Contrast  Result Date: 09/11/2017 CLINICAL DATA:  Code stroke. Focal neuro deficit less than 6 hours. Right-sided weakness EXAM: CT HEAD WITHOUT CONTRAST TECHNIQUE: Contiguous axial images were obtained from the base of the skull through the vertex without  intravenous contrast. COMPARISON:  MRI 01/17/2015 FINDINGS: Brain: Generalized atrophy. Negative for hydrocephalus. Negative for acute infarct, hemorrhage, mass lesion. Vascular: Extensive calcification in the right vertebral artery and basilar. Calcification also in the cavernous carotid bilaterally. Hyperdensity distal basilar suggestive of acute thrombosis. Skull: Negative Sinuses/Orbits: Mild mucosal edema paranasal sinuses. Bilateral cataract removal. Other: None ASPECTS ([place] Stroke Program Early CT Score) - Ganglionic level infarction (caudate, lentiform nuclei, internal capsule, insula, M1-M3 cortex): 7 - Supraganglionic infarction (M4-M6 cortex): 3 Total score (0-10 with 10 being normal): 10 IMPRESSION: 1. No acute infarct. 2. Hyperdense distal basilar suggestive of acute thrombosis. 3. ASPECTS is 10 4. These results were called by telephone at the time of interpretation on 09/11/2017 at 6:18 pm to Dr. Rory Percy, who verbally acknowledged these results. Electronically Signed   By: Franchot Gallo M.D.   On: 09/11/2017 18:21    Cardiac Studies     Patient Profile     82 y.o. male with history of CAD s/p 4V CABG, HTN, hypothyroid, OSA, COPD, AAA presenting with a basilar artery stroke s/p IV TPA. Found to have a new dx of Afib s/p TPA with rates in the 70s.   Assessment & Plan    1. New Afib: Noted s/p TPA infusion. Rates have been slow in the 40s but stable. CHADSVASC- 6 (Age, CAD, Stroke, HTN). Now on low dose Eliquis 2.5mg  BID per neurology. Overall asymptomatic. Could consider outpatient monitor at time of DC to monitor rates at home.   2. CAD s/p CABG: no anginal symptoms.   3. HTN: stable  Signed, Reino Bellis, NP  09/13/2017, 12:04 PM  Pager # [phone]    The patient has been seen in conjunction with Reino Bellis, NP-C. All aspects of care have been considered and discussed. The patient has been personally interviewed, examined, and all clinical data has been  reviewed.   Prior monitor 2015 demonstrated significant bradycardia due to heart block that improved after DC beta blocker. Ave HR 60 bpm. No AF noted. Monitor was done because of recurrent syncope. No syncope since BB stopped.  Admitted now with new posterior circulation stroke, reperfusion therapy with rTPA led to dramatic improvement, and AF with svr noted on admission. Started on long term anticoagulation.  Since admission, AF with slow VR has persisted and rates between 33 - 60 bpm have been noted.  Need to consider elective permanent pacemaker in this man with AV node and sinus node dysfunction. Syncope on anticoagulation therapy in this situation increases risk of poor outcome. Should not drive drive until stable rhythm and demonstrate no recurrent CNS event tendency. Have requested EP consult.     For questions or updates, please contact [place] Please consult [email or web address] for contact info under Cardiology/STEMI. Daytime calls, contact the Day Call APP (6a-8a) or assigned team (Teams A-D) provider (7:30a - 5p). All other daytime calls (7:30-5p), contact the Card Master @ [phone].   Nighttime calls, contact the assigned APP (5p-8p) or MD (6:30p-8p). Overnight calls (8p-6a), contact the on call Fellow @ [phone].

## 2017-09-13 NOTE — Progress Notes (Signed)
OT Note - Addendum    09/13/17 1800  OT Visit Information  Last OT Received On 09/13/17  OT Time Calculation  OT Start Time (ACUTE ONLY) 1620  OT Stop Time (ACUTE ONLY) 1647  OT Time Calculation (min) 27 min  OT General Charges  $OT Visit 1 Visit  OT Evaluation  $OT Eval Moderate Complexity 1 Mod  OT Treatments  $Self Care/Home Management  8-22 mins  [facility], OT/L  [phone] 09/13/2017

## 2017-09-13 NOTE — Progress Notes (Signed)
  Echocardiogram 2D Echocardiogram has been performed.  Derek [name] 09/13/2017, 1:48 PM

## 2017-09-14 DIAGNOSIS — I480 Paroxysmal atrial fibrillation: Secondary | ICD-10-CM

## 2017-09-14 LAB — BASIC METABOLIC PANEL
ANION GAP: 8 (ref 5–15)
BUN: 22 mg/dL — AB (ref 6–20)
CALCIUM: 8.4 mg/dL — AB (ref 8.9–10.3)
CO2: 23 mmol/L (ref 22–32)
Chloride: 109 mmol/L (ref 101–111)
Creatinine, Ser: 1.89 mg/dL — ABNORMAL HIGH (ref 0.61–1.24)
GFR calc Af Amer: 37 mL/min — ABNORMAL LOW (ref >60)
GFR, EST NON AFRICAN AMERICAN: 32 mL/min — AB (ref >60)
GLUCOSE: 97 mg/dL (ref 65–99)
POTASSIUM: 4.1 mmol/L (ref 3.5–5.1)
SODIUM: 140 mmol/L (ref 135–145)

## 2017-09-14 LAB — CBC
HEMATOCRIT: 39.6 % (ref 39.0–52.0)
Hemoglobin: 12.5 g/dL — ABNORMAL LOW (ref 13.0–17.0)
MCH: 30 pg (ref 26.0–34.0)
MCHC: 31.6 g/dL (ref 30.0–36.0)
MCV: 95 fL (ref 78.0–100.0)
Platelets: 110 10*3/uL — ABNORMAL LOW (ref 150–400)
RBC: 4.17 MIL/uL — ABNORMAL LOW (ref 4.22–5.81)
RDW: 14.8 % (ref 11.5–15.5)
WBC: 6.1 10*3/uL (ref 4.0–10.5)

## 2017-09-14 MED ORDER — APIXABAN 2.5 MG PO TABS: 2.5000 mg | ORAL_TABLET | Freq: Two times a day (BID) | ORAL | 0 refills | Status: DC

## 2017-09-14 MED ORDER — APIXABAN 2.5 MG PO TABS: 2.5000 mg | ORAL_TABLET | Freq: Two times a day (BID) | ORAL | 3 refills | Status: DC

## 2017-09-14 NOTE — Progress Notes (Signed)
OT Note - Addendum    09/14/17 1400  OT Visit Information  Last OT Received On 09/14/17  OT Time Calculation  OT Start Time (ACUTE ONLY) 0935  OT Stop Time (ACUTE ONLY) 1010  OT Time Calculation (min) 35 min  OT Treatments  $Therapeutic Activity 23-37 mins  [facility], OT/L  [phone] 09/14/2017

## 2017-09-14 NOTE — Consult Note (Signed)
           [facility] CM Primary Care Navigator  09/14/2017  Derek [name] 10-21-1935 [id number]   Went to seepatient at the bedsideto identify possible discharge needs but he was already discharged per staff report.  Patient was discharged home today with outpatient therapy.  Primary care provider's officeis listed as doing transition of care (TOC).  Noted that patient has a discharge instruction to follow-up with primary care provider within two weeks.   For questions, please contact:  Dannielle Huh, BSN, RN- [facility] Primary Care Navigator  Telephone: [phone] [place]

## 2017-09-14 NOTE — Progress Notes (Signed)
Discharge orders received.  Discharge instructions and follow-up appointments reviewed with the patient and his daughter.  VSS upon discharge.  IV removed and education complete.  All belongings sent with the patient.  Transported out via wheelchair.   Cori Razor, RN

## 2017-09-14 NOTE — Progress Notes (Signed)
Patients son and daughter arrived around 25 regarding patients lost wallet, patients son is concerned about patient being discharged possible tomorrow, and plans for pacemaker the son is the patients health care power of attorney acording to the son and he feels he should be included or informed about any plans to discharge patients. The son would like to be included in any discussion to discharge patient.

## 2017-09-14 NOTE — Therapy (Signed)
Occupational Therapy Treatment and Discharge Patient Details Name: Derek [name] MRN: [id number] DOB: 1935-06-18 Today's Date: 09/14/2017    History of present illness 82 y.o. male admitted on 09/11/17 with AMS, R sided weakness, CT revealed a heavily calcified right vertebral artery and basilar artery on the noncontrast CT of the head, and also had a suspicious looking finding which was concerning for thrombus on the distal basilar.  IV tPA was given.  EKG was also given and revealed A-fib with slow ventricular rate (HR was 30s-40s in CT scanner).  Pt also went to IR for S/P bilateral common carotid artery and RT subclavian arteriograms on 09/12/17.  Pt with significant PMH of SOB, MI, HTN, CAD, COPD, AAA, arthritis s/p L knee arthroscopy, CABG.     OT comments  Focus of treatment session to complete the [facility] Cognitive Assessment [facility]). Pt scored 22/30 (normal score considered 26/30). Primary difficulties in the areas of attention (serial 7 subtraction starting at 100) and delayed recall. Pt educated on results of assessment and recommend follow up at neuro outpatient OT to continue to address cognitive deficits and increase independence. No further acute OT needs at this time. OT signing off.     Follow Up Recommendations  Outpatient OT;Supervision/Assistance - 24 hour    Equipment Recommendations  None recommended by OT    Recommendations for Other Services      Precautions / Restrictions Precautions Precautions: Fall Restrictions Weight Bearing Restrictions: No       Mobility Bed Mobility               General bed mobility comments: Pt sitting in chair upon OT arrival.  Transfers                 General transfer comment:     Balance Overall balance assessment: Modified Independent                                         ADL either performed or assessed with clinical judgement   ADL                                                Vision  Pt states vision was blurry with onset of symptoms. Pt with no vision concerns at this time.     Perception     Praxis      Cognition Arousal/Alertness: Awake/alert        Completed the [place] Cognitive Assessment ([place]). Pt scored 22/30 (normal score considered 26/30). Primary difficulty in the areas of attention (serial 7 subtraction starting at 100) and delayed recall. Pt was able to make some self-corrections during attention tasks. Pt scored a 0/5 in delayed recall.  Pt and son educated on result of assessment and advised to follow up with outpatient neuro occupational therapy to facilitate return to PLOF. Pt agreeable. Pt advised to have supervision for financial and medication management initially upon d/c. Discussed refraining from driving until cleared by MD.                                      Exercises     Shoulder Instructions  General Comments Pts son present during session. Son expressed concerns of pt appearing with some confusion last night.     Pertinent Vitals/ Pain       Pain Assessment: No/denies pain  Home Living                                          Prior Functioning/Environment              Frequency  Min 2X/week        Progress Toward Goals  OT Goals(current goals can now be found in the care plan section)     Acute Rehab OT Goals Patient Stated Goal: To go home and get back to driving/traveling OT Goal Formulation: With patient Time For Goal Achievement: 09/13/17 Potential to Achieve Goals: Good  Plan (P) Discharge plan remains appropriate    Co-evaluation                 AM-PAC PT "6 Clicks" Daily Activity     Outcome Measure   Help from another person eating meals?: None Help from another person taking care of personal grooming?: None Help from another person toileting, which includes using toliet, bedpan, or urinal?: None Help from another person  bathing (including washing, rinsing, drying)?: None Help from another person to put on and taking off regular upper body clothing?: None Help from another person to put on and taking off regular lower body clothing?: None 6 Click Score: 24    End of Session    OT Visit Diagnosis: Unsteadiness on feet (R26.81);Muscle weakness (generalized) (M62.81)   Activity Tolerance Patient tolerated treatment well   Patient Left in chair;with call bell/phone within reach;with family/visitor present   Nurse Communication Mobility status        Time: 0935-1010 OT Time Calculation (min): 35 min  Charges:    Boykin Peek, OTS [phone]    Boykin Peek 09/14/2017, 1:24 PM

## 2017-09-14 NOTE — Progress Notes (Signed)
CM consulted for outpatient therapy. CM met with the patient and he would like to attend [facility]. Orders in EPIC and information on the AVS.  CM also consulted for Eliquis. CM provided him a 30 day free card. Benefits check revealed a cost of $24/ 90 day supply through mail order or $28/ month at the pharmacy. Patient made aware and agreeable to cost. CM asked MD to do 2 prescriptions. 1st--30 days free and the 2nd for 90 day supply through [facility]. CM faxed the 90 day prescription to [facility]: [phone] and received confirmation of fax. CM also provided prescription to the patient and encouraged him to follow up with [facility] on the receipt of the fax.  Daughter to provide transportation home.

## 2017-09-14 NOTE — Progress Notes (Signed)
Physical Therapy Treatment Patient Details Name: Derek [name] MRN: [id number] DOB: 02-01-1935 Today's Date: 09/14/2017    History of Present Illness 82 y.o. male admitted on 09/11/17 with AMS, R sided weakness, CT revealed a heavily calcified right vertebral artery and basilar artery on the noncontrast CT of the head, and also had a suspicious looking finding which was concerning for thrombus on the distal basilar.  IV tPA was given.  EKG was also given and revealed A-fib with slow ventricular rate (HR was 30s-40s in CT scanner).  Pt also went to IR for S/P bilateral common carotid artery and RT subclavian arteriograms on 09/12/17.  Pt with significant PMH of SOB, MI, HTN, CAD, COPD, AAA, arthritis s/p L knee arthroscopy, CABG.      PT Comments    Pt progressing towards physical therapy goals. Session focused on dynamic balance during gait training, however time was limited. Cardiologist present at end of session and pt eager to be part of the discussion so the rest of PT session will be deferred to next available treatment session. Will continue to follow.    Follow Up Recommendations  Outpatient PT;Supervision for mobility/OOB     Equipment Recommendations  None recommended by PT    Recommendations for Other Services       Precautions / Restrictions Precautions Precautions: Fall Precaution Comments: mildly unsteady on his feet Restrictions Weight Bearing Restrictions: No    Mobility  Bed Mobility               General bed mobility comments: Pt sitting in chair upon PT arrival.  Transfers Overall transfer level: Needs assistance Equipment used: None Transfers: Sit to/from Stand Sit to Stand: Supervision         General transfer comment: Supervision for safety. Pt demonstrated proper hand placement on seated surface for safety.   Ambulation/Gait Ambulation/Gait assistance: Min guard Ambulation Distance (Feet): 500 Feet Assistive device: None Gait  Pattern/deviations: Step-through pattern;Decreased dorsiflexion - left;Decreased stance time - left;Staggering right;Staggering left Gait velocity: too fast to be safe Gait velocity interpretation: at or above normal speed for age/gender General Gait Details: Gait training challenged with varying speeds, and pt demonstrated decreased balance with a slower gait speed. Hands-on guarding for safety however pt was able to recover without assistance. Noted R hip hike/uneven hip height in standing, and pt with increased R lateral lean.    Stairs            Wheelchair Mobility    Modified Rankin (Stroke Patients Only) Modified Rankin (Stroke Patients Only) Pre-Morbid Rankin Score: No symptoms Modified Rankin: Moderately severe disability     Balance Overall balance assessment: Modified Independent Sitting-balance support: Feet supported;No upper extremity supported Sitting balance-Leahy Scale: Good     Standing balance support: No upper extremity supported Standing balance-Leahy Scale: Good Standing balance comment: Balance deficits evident when challenged.                             Cognition Arousal/Alertness: Awake/alert Behavior During Therapy: WFL for tasks assessed/performed Overall Cognitive Status: History of cognitive impairments - at baseline                                 General Comments: Pt with some impulsivity, lack of insight and safety awareness. Will further assess.      Exercises      General Comments  Pertinent Vitals/Pain Pain Assessment: No/denies pain    Home Living                      Prior Function            PT Goals (current goals can now be found in the care plan section) Acute Rehab PT Goals Patient Stated Goal: To go home and get back to driving/traveling PT Goal Formulation: With patient Time For Goal Achievement: 09/27/17 Potential to Achieve Goals: Good Progress towards PT goals:  Progressing toward goals    Frequency    Min 4X/week      PT Plan Current plan remains appropriate    Co-evaluation              AM-PAC PT "6 Clicks" Daily Activity  Outcome Measure  Difficulty turning over in bed (including adjusting bedclothes, sheets and blankets)?: None Difficulty moving from lying on back to sitting on the side of the bed? : None Difficulty sitting down on and standing up from a chair with arms (e.g., wheelchair, bedside commode, etc,.)?: Unable Help needed moving to and from a bed to chair (including a wheelchair)?: A Little Help needed walking in hospital room?: A Little Help needed climbing 3-5 steps with a railing? : A Little 6 Click Score: 18    End of Session Equipment Utilized During Treatment: Gait belt Activity Tolerance: Patient tolerated treatment well Patient left: in chair;with call bell/phone within reach;with chair alarm set;with family/visitor present Nurse Communication: Mobility status PT Visit Diagnosis: Unsteadiness on feet (R26.81);Other symptoms and signs involving the nervous system (R29.898)     Time: 7829-5621 PT Time Calculation (min) (ACUTE ONLY): 12 min  Charges:  $Gait Training: 8-22 mins                    G Codes:       Rolinda Roan, PT, DPT Acute Rehabilitation Services Pager: [phone]    Thelma Comp 09/14/2017, 3:40 PM

## 2017-09-14 NOTE — Discharge Instructions (Addendum)
aspirin 81 mg daily and Eliquis 2.5mg  twice a day for secondary stroke and cardiac prevention. No missing doses  Continue other medication as prescribed.  BP goal at 130-150. 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  Follow-up Merrilee Seashore, MD in 1-2 weeks for stroke risk factor modification.  Follow up with Dr. Estanislado Pandy as outpt in 2 months  Follow up with cardiology to discuss about pacemaker in 2 weeks  Continue outpt PT/OT. You will receive calls from rehab for appointment.  Follow-up with Dr. Jaynee Eagles at [facility] in 6 weeks, office to schedule an appointment.  Information on my medicine - ELIQUIS (apixaban)  This medication education was reviewed with me or my healthcare representative as part of my discharge preparation.  The pharmacist that spoke with me during my hospital stay was:  Tad Moore, [facility]  Why was Eliquis prescribed for you? Eliquis was prescribed for you to reduce the risk of forming blood clots that can cause a stroke if you have a medical condition called atrial fibrillation (a type of irregular heartbeat) OR to reduce the risk of a blood clots forming after orthopedic surgery.  What do You need to know about Eliquis ? Take your Eliquis TWICE DAILY - one tablet in the morning and one tablet in the evening with or without food.  It would be best to take the doses about the same time each day.  If you have difficulty swallowing the tablet whole please discuss with your pharmacist how to take the medication safely.  Take Eliquis exactly as prescribed by your doctor and DO NOT stop taking Eliquis without talking to the doctor who prescribed the medication.  Stopping may increase your risk of developing a new clot or stroke.  Refill your prescription before you run out.  After discharge, you should have regular check-up appointments with your healthcare provider that is prescribing your Eliquis.  In the future your dose may need to be  changed if your kidney function or weight changes by a significant amount or as you get older.  What do you do if you miss a dose? If you miss a dose, take it as soon as you remember on the same day and resume taking twice daily.  Do not take more than one dose of ELIQUIS at the same time.  Important Safety Information A possible side effect of Eliquis is bleeding. You should call your healthcare provider right away if you experience any of the following: ? Bleeding from an injury or your nose that does not stop. ? Unusual colored urine (red or dark brown) or unusual colored stools (red or black). ? Unusual bruising for unknown reasons. ? A serious fall or if you hit your head (even if there is no bleeding).  Some medicines may interact with Eliquis and might increase your risk of bleeding or clotting while on Eliquis. To help avoid this, consult your healthcare provider or pharmacist prior to using any new prescription or non-prescription medications, including herbals, vitamins, non-steroidal anti-inflammatory drugs (NSAIDs) and supplements.  This website has more information on Eliquis (apixaban): [email or web address].    Ischemic Stroke An ischemic stroke (cerebrovascular accident, or CVA) is the sudden death of brain tissue that occurs when an area of the brain does not get enough oxygen. It is a medical emergency that must be treated right away. An ischemic stroke can cause permanent loss of brain function. This can cause problems with how different parts of your body function. What  are the causes? This condition is caused by a decrease of oxygen supply to an area of the brain, which may be the result of:  A small blood clot (embolus) or a buildup of plaque in the blood vessels (atherosclerosis) that blocks blood flow in the brain.  An abnormal heart rhythm (atrial fibrillation).  A blocked or damaged artery in the head or neck.  What increases the risk? Certain factors may make  you more likely to develop this condition. Some of these factors are things that you can change, such as:  Obesity.  Smoking cigarettes.  Taking oral birth control, especially if you also use tobacco.  Physical inactivity.  Excessive alcohol use.  Use of illegal drugs, especially cocaine and methamphetamine.  Other risk factors include:  High blood pressure (hypertension).  High cholesterol.  Diabetes mellitus.  Heart disease.  Being [place] American, Native American, Hispanic, or [place] Native.  Being over age 25.  Family history of stroke.  Previous history of blood clots, stroke, or transient ischemic attack (TIA).  Sickle cell disease.  Being a woman with a history of preeclampsia.  Migraine headache.  Sleep apnea.  Irregular heartbeats, such as atrial fibrillation.  Chronic inflammatory diseases, such as rheumatoid arthritis or lupus.  Blood clotting disorders (hypercoagulable state).  What are the signs or symptoms? Symptoms of this condition usually develop suddenly, or you may notice them after waking up from sleep. Symptoms may include sudden:  Weakness or numbness in your face, arm, or leg, especially on one side of your body.  Trouble walking or difficulty moving your arms or legs.  Loss of balance or coordination.  Confusion.  Slurred speech (dysarthria).  Trouble speaking, understanding speech, or both (aphasia).  Vision changes--such as double vision, blurred vision, or loss of vision--inone or both eyes.  Dizziness.  Nausea and vomiting.  Severe headache with no known cause. The headache is often described as the worst headache ever experienced.  If possible, make note of the exact time that you last felt like your normal self and what time your symptoms started. Tell your health care provider. If symptoms come and go, this could be a sign of a warning stroke, or TIA. Get help right away, even if you feel better. How is this  diagnosed? This condition may be diagnosed based on:  Your symptoms, your medical history, and a physical exam.  CT scan of the brain.  MRI.  CT angiogram. This test uses a computer to take X-rays of your arteries. A dye may be injected into your blood to show the inside of your blood vessels more clearly.  MRI angiogram. This is a type of MRI that is used to evaluate the blood vessels.  Cerebral angiogram. This test uses X-rays and a dye to show the blood vessels in the brain and neck.  You may need to see a health care provider who specializes in stroke care. A stroke specialist can be seen in person or through communication using telephone or television technology (telemedicine). Other tests may also be done to find the cause of the stroke, such as:  Electrocardiogram (ECG).  Continuous heart monitoring.  Echocardiogram.  Carotid ultrasound.  A scan of the brain circulation.  Blood tests.  Sleep study to check for sleep apnea.  How is this treated? Treatment for this condition will depend on the duration, severity, and cause of your symptoms and on the area of the brain affected. It is very important to get treatment at the  first sign of stroke symptoms. Some treatments work better if they are done within 3-6 hours of the onset of stroke symptoms. These initial treatments may include:  Aspirin.  Medicines to control blood pressure.  Medicine given by injection to dissolve the blood clot (thrombolytic).  Treatments given directly to the affected artery to remove or dissolve the blood clot.  Other treatment options may include:  Oxygen.  IV fluids.  Medicines to thin the blood (anticoagulants or antiplatelets).  Procedures to increase blood flow.  Medicines and changes to your diet may be used to help treat and manage risk factors for stroke, such as diabetes, high cholesterol, and high blood pressure. After a stroke, you may work with physical, speech, mental  health, or occupational therapists to help you recover. Follow these instructions at home: Medicines  Take over-the-counter and prescription medicines only as told by your health care provider.  If you were told to take a medicine to thin your blood, such as aspirin or an anticoagulant, take it exactly as told by your health care provider. ? Taking too much blood-thinning medicine can cause bleeding. ? If you do not take enough blood-thinning medicine, you will not have the protection that you need against another stroke and other problems.  Understand the side effects of taking anticoagulant medicine. When taking this type of medicine, make sure you: ? Hold pressure over any cuts for longer than usual. ? Tell your dentist and other health care providers that you are taking anticoagulants before you have any procedures that may cause bleeding. ? Avoid activities that may cause trauma or injury. Eating and drinking  Follow instructions from your health care provider about diet.  Eat healthy foods.  If your ability to swallow was affected by the stroke, you may need to take steps to avoid choking, such as: ? Taking small bites when eating. ? Eating foods that are soft or pureed. Safety  Follow instructions from your health care team about physical activity.  Use a walker or cane as told by your health care provider.  Take steps to create a safe home environment in order to reduce the risk of falls. This may include: ? Having your home looked at by specialists. ? Installing grab bars in the bedroom and bathroom. ? Using safety equipment, such as raised toilets and a seat in the shower. General instructions  Do not use any tobacco products, such as cigarettes, chewing tobacco, and e-cigarettes. If you need help quitting, ask your health care provider.  Limit alcohol intake to no more than 1 drink a day for nonpregnant women and 2 drinks a day for men. One drink equals 12 oz of beer,  5 oz of wine, or 1 oz of hard liquor.  If you need help to stop using drugs or alcohol, ask your health care provider about a referral to a program or specialist.  Maintain an active and healthy lifestyle. Get regular exercise as told by your health care provider.  Keep all follow-up visits as told by your health care provider, including visits with all specialists on your health care team. This is important. How is this prevented? Your risk of another stroke can be decreased by managing high blood pressure, high cholesterol, diabetes, heart disease, sleep apnea, and obesity. It can also be decreased by quitting smoking, limiting alcohol, and staying physically active. Your health care provider will continue to work with you on measures to prevent short-term and long-term complications of stroke. Get help right  away if: You have:  Sudden weakness or numbness in your face, arm, or leg, especially on one side of your body.  Sudden confusion.  Sudden trouble speaking, understanding, or both (aphasia).  Sudden trouble seeing with one or both eyes.  Sudden trouble walking or difficulty moving your arms or legs.  Sudden dizziness.  Sudden loss of balance or coordination.  Sudden, severe headache with no known cause.  A partial or total loss of consciousness.  A seizure. Any of these symptoms may represent a serious problem that is an emergency. Do not wait to see if the symptoms will go away. Get medical help right away. Call your local emergency services (911 in [place] Do not drive yourself to the hospital. This information is not intended to replace advice given to you by your health care provider. Make sure you discuss any questions you have with your health care provider. Document Released: 12/07/2005 Document Revised: 05/19/2016 Document Reviewed: 03/04/2016 Elsevier Interactive Patient Education  2017 [employer].   Stroke Prevention Some medical conditions and behaviors are  associated with an increased chance of having a stroke. You may prevent a stroke by making healthy choices and managing medical conditions. How can I reduce my risk of having a stroke?  Stay physically active. Get at least 30 minutes of activity on most or all days.  Do not smoke. It may also be helpful to avoid exposure to secondhand smoke.  Limit alcohol use. Moderate alcohol use is considered to be: ? No more than 2 drinks per day for men. ? No more than 1 drink per day for nonpregnant women.  Eat healthy foods. This involves: ? Eating 5 or more servings of fruits and vegetables a day. ? Making dietary changes that address high blood pressure (hypertension), high cholesterol, diabetes, or obesity.  Manage your cholesterol levels. ? Making food choices that are high in fiber and low in saturated fat, trans fat, and cholesterol may control cholesterol levels. ? Take any prescribed medicines to control cholesterol as directed by your health care provider.  Manage your diabetes. ? Controlling your carbohydrate and sugar intake is recommended to manage diabetes. ? Take any prescribed medicines to control diabetes as directed by your health care provider.  Control your hypertension. ? Making food choices that are low in salt (sodium), saturated fat, trans fat, and cholesterol is recommended to manage hypertension. ? Ask your health care provider if you need treatment to lower your blood pressure. Take any prescribed medicines to control hypertension as directed by your health care provider. ? If you are 79-49 years of age, have your blood pressure checked every 3-5 years. If you are 17 years of age or older, have your blood pressure checked every year.  Maintain a healthy weight. ? Reducing calorie intake and making food choices that are low in sodium, saturated fat, trans fat, and cholesterol are recommended to manage weight.  Stop drug abuse.  Avoid taking birth control pills. ? Talk  to your health care provider about the risks of taking birth control pills if you are over 32 years old, smoke, get migraines, or have ever had a blood clot.  Get evaluated for sleep disorders (sleep apnea). ? Talk to your health care provider about getting a sleep evaluation if you snore a lot or have excessive sleepiness.  Take medicines only as directed by your health care provider. ? For some people, aspirin or blood thinners (anticoagulants) are helpful in reducing the risk of forming abnormal  blood clots that can lead to stroke. If you have the irregular heart rhythm of atrial fibrillation, you should be on a blood thinner unless there is a good reason you cannot take them. ? Understand all your medicine instructions.  Make sure that other conditions (such as anemia or atherosclerosis) are addressed. Get help right away if:  You have sudden weakness or numbness of the face, arm, or leg, especially on one side of the body.  Your face or eyelid droops to one side.  You have sudden confusion.  You have trouble speaking (aphasia) or understanding.  You have sudden trouble seeing in one or both eyes.  You have sudden trouble walking.  You have dizziness.  You have a loss of balance or coordination.  You have a sudden, severe headache with no known cause.  You have new chest pain or an irregular heartbeat. Any of these symptoms may represent a serious problem that is an emergency. Do not wait to see if the symptoms will go away. Get medical help at once. Call your local emergency services (911 in [place] Do not drive yourself to the hospital. This information is not intended to replace advice given to you by your health care provider. Make sure you discuss any questions you have with your health care provider. Document Released: 01/14/2005 Document Revised: 05/14/2016 Document Reviewed: 06/09/2013 Elsevier Interactive Patient Education  2017 [employer].

## 2017-09-14 NOTE — Progress Notes (Addendum)
Cardiology  Please see the electrophysiology note from Dr. Caryl Comes dated 09/13/17  We will insure that the patient has appropriate follow-up with electrophysiology.  Prolonged discussion with the patient, son, and close male friend. They are upset that pacer cannot be done prior to discharge. Discussion lasted > 30 minutes and related to timing of pacemaker therapy, increased risk of recurrent emboli if discontinuation of anticoagulation within 30 days of embolic stroke, and increased risk of bleeding complication including pocket hematoma and possible infection if anticoagulation resumed too soon after insertion.  Discharge home per neurology.

## 2017-09-15 ENCOUNTER — Encounter (HOSPITAL_COMMUNITY): Payer: Self-pay | Admitting: Interventional Radiology

## 2017-09-17 ENCOUNTER — Ambulatory Visit (INDEPENDENT_AMBULATORY_CARE_PROVIDER_SITE_OTHER): Payer: Medicare Other | Admitting: Physician Assistant

## 2017-09-17 ENCOUNTER — Telehealth: Payer: Self-pay | Admitting: Interventional Cardiology

## 2017-09-17 ENCOUNTER — Encounter: Payer: Self-pay | Admitting: Physician Assistant

## 2017-09-17 VITALS — BP 150/78 | HR 46 | Ht 69.0 in | Wt 227.0 lb

## 2017-09-17 DIAGNOSIS — I714 Abdominal aortic aneurysm, without rupture, unspecified: Secondary | ICD-10-CM

## 2017-09-17 DIAGNOSIS — R001 Bradycardia, unspecified: Secondary | ICD-10-CM | POA: Diagnosis not present

## 2017-09-17 DIAGNOSIS — R0789 Other chest pain: Secondary | ICD-10-CM

## 2017-09-17 DIAGNOSIS — I25810 Atherosclerosis of coronary artery bypass graft(s) without angina pectoris: Secondary | ICD-10-CM

## 2017-09-17 DIAGNOSIS — I10 Essential (primary) hypertension: Secondary | ICD-10-CM

## 2017-09-17 DIAGNOSIS — R0602 Shortness of breath: Secondary | ICD-10-CM | POA: Diagnosis not present

## 2017-09-17 DIAGNOSIS — I5033 Acute on chronic diastolic (congestive) heart failure: Secondary | ICD-10-CM | POA: Diagnosis not present

## 2017-09-17 DIAGNOSIS — Z8673 Personal history of transient ischemic attack (TIA), and cerebral infarction without residual deficits: Secondary | ICD-10-CM | POA: Diagnosis not present

## 2017-09-17 DIAGNOSIS — I639 Cerebral infarction, unspecified: Secondary | ICD-10-CM

## 2017-09-17 DIAGNOSIS — I480 Paroxysmal atrial fibrillation: Secondary | ICD-10-CM

## 2017-09-17 LAB — BASIC METABOLIC PANEL
BUN/Creatinine Ratio: 16 (ref 10–24)
BUN: 26 mg/dL (ref 8–27)
CALCIUM: 9.1 mg/dL (ref 8.6–10.2)
CO2: 22 mmol/L (ref 20–29)
CREATININE: 1.67 mg/dL — AB (ref 0.76–1.27)
Chloride: 104 mmol/L (ref 96–106)
GFR calc Af Amer: 44 mL/min/{1.73_m2} — ABNORMAL LOW (ref >59)
GFR, EST NON AFRICAN AMERICAN: 38 mL/min/{1.73_m2} — AB (ref >59)
GLUCOSE: 94 mg/dL (ref 65–99)
Potassium: 4.3 mmol/L (ref 3.5–5.2)
SODIUM: 142 mmol/L (ref 134–144)

## 2017-09-17 LAB — PRO B NATRIURETIC PEPTIDE: NT-PRO BNP: 5156 pg/mL — AB (ref 0–486)

## 2017-09-17 MED ORDER — FUROSEMIDE 20 MG PO TABS: 20.0000 mg | ORAL_TABLET | Freq: Every day | ORAL | 3 refills | Status: DC

## 2017-09-17 NOTE — Telephone Encounter (Signed)
No answer/will try again.

## 2017-09-17 NOTE — Progress Notes (Signed)
Cardiology Office Note    Date:  09/18/2017   ID:  ZARIN KNUPP, DOB Jul 24, 1935, MRN [id number]  PCP:  Merrilee Seashore, MD  Cardiologist:  Dr. Tamala Julian Primary electrophysiologist: Dr. Caryl Comes  Chief Complaint  Patient presents with  . Hospitalization Follow-up    seen for Dr. Tamala Julian    History of Present Illness:  FAMOUS EISENHARDT is a 82 y.o. male with PMH of CAD s/p 4v CABG 2007, HTN, hypothyroidism, OSA, COPD, AAA and recent CVA. 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 Post PTCA, he had atrial fibrillation which seems to be new was slow heart rate in the 40s. Holter monitors in 2015 demonstrated significant bradycardia due to heart block that improved after discontinuing beta blocker. Average heart rate was in the 60s. The heart monitor was done due to recurrent syncope at the time. During this admission he was seen by Dr. Caryl Comes on 09/13/2017, is suspected patient likely will require pacemaker due to chronotropic incompetence, he could not undergo treadmill testing. Due to the paucity of attributable symptom, Dr. Caryl Comes would prefer to wait a few weeks to avoid increasing bleeding issue. He was placed on eliquis and aspirin. Echocardiogram obtained on 09/13/2017 showed EF 55%, indeterminate diastolic function due to atrial fibrillation, mild LVH, mildly D-shaped interventricular septum suggesting RV pressure/volume overload, dilated aortic root measuring 38 mm, moderately dilated left atrium and right atrium, PA peak pressure 34 mmHg.  Patient presents today for cardiology office visit. He has gained roughly 10 pounds according to family member. His weight today is 227 pounds. On physical exam, he appears to be volume overloaded with at least 2-3+ pitting edema in bilateral lower extremity, his abdomen is distended. He has prominent bilateral basilar crackles on exam. It is unclear to me why he is accumulating fluid. He does complain of very vague chest  discomfort, however he is not a very good candidate for any ischemic workup at this time. Given recent stroke, he would not be a candidate for invasive study with subsequent DAPT. Also he cannot walk on a treadmill, his bradycardia precluded him from proceeding with any Lexiscan Myoview. He is currently on 20 mg every other day of Lasix with good urinary output. I asked him to weigh himself every morning. I will change the Lasix to 20 mg daily. He will need a basic metabolic panel today and also in one week. I will also need a BNP as well. He is blood pressure is elevated today, I will not treat this blood pressure as I think higher than normal blood pressure is protective in the setting of slow heart rate. I am also concerned of over diuresing him, he would not react very well to dehydration and will likely pass out. Therefore I did not aggressively diuresing him. EKG today continued to show slow atrial fibrillation with heart rate of 46. We also discussed other heart failure treatment strategies included avoid salt and a limited total amount of fluid intake per day to 32-64 ounces.    Past Medical History:  Diagnosis Date  . AAA (abdominal aortic aneurysm) ([place])   . Arthritis   . COPD (chronic obstructive pulmonary disease) ([place])   . Coronary artery disease   . Hypertension   . Hypothyroidism   . Myocardial infarction ([place])   . Shortness of breath    on exertion;  has productive cough at times  . Sleep apnea    states mild . no cpap  Past Surgical History:  Procedure Laterality Date  . APPENDECTOMY  1963  . CATARACT EXTRACTION     w/ lid rise  . CORONARY ARTERY BYPASS GRAFT  2007  . EYE SURGERY Right March 2016   Cataract  . EYE SURGERY Bilateral May 2016   Eyebrow Lift  . EYE SURGERY Left June 2016   Cataract  . IR ANGIO INTRA EXTRACRAN SEL COM CAROTID INNOMINATE BILAT MOD SED  09/12/2017  . IR ANGIO VERTEBRAL SEL SUBCLAVIAN INNOMINATE UNI R MOD SED  09/12/2017  . KNEE ARTHROSCOPY   02/26/2012   Procedure: ARTHROSCOPY KNEE;  Surgeon: Sydnee Cabal, MD;  Location: [facility];  Service: Orthopedics;  Laterality: Left;  medial menisectomy and chondraplasty    Current Medications: Outpatient Medications Prior to Visit  Medication Sig Dispense Refill  . Acetaminophen (TYLENOL PO) Take 1-2 tablets by mouth every 6 (six) hours as needed (pain/headache).    Derrill Memo ON 10/12/2017] apixaban (ELIQUIS) 2.5 MG TABS tablet Take 1 tablet (2.5 mg total) by mouth 2 (two) times daily. 180 tablet 3  . aspirin EC 81 MG tablet Take 81 mg by mouth daily after supper.    . Biotin 5000 MCG TABS Take 5,000 mcg by mouth daily.    . budesonide-formoterol (SYMBICORT) 160-4.5 MCG/ACT inhaler Inhale 2 puffs into the lungs 2 (two) times daily. (Patient taking differently: Inhale 2 puffs into the lungs 2 (two) times daily as needed (shortness of breath/wheezing). ) 3 Inhaler 3  . Choline Fenofibrate (TRILIPIX) 135 MG capsule Take 135 mg by mouth daily after supper.     . ezetimibe (ZETIA) 10 MG tablet Take 10 mg by mouth daily after supper.     . famotidine (PEPCID) 20 MG tablet Take 20 mg by mouth daily as needed for heartburn or indigestion.     . Febuxostat (ULORIC) 80 MG TABS Take 80 mg by mouth daily after supper.    Marland Kitchen GLUCOSAMINE-CHONDROITIN PO Take 1 tablet by mouth 2 (two) times daily. 1500-1200    . levothyroxine (SYNTHROID, LEVOTHROID) 88 MCG tablet Take 88 mcg by mouth daily before breakfast.    . Magnesium 250 MG TABS Take 250 mg by mouth daily.    Marland Kitchen MELATONIN PO Take 1 tablet by mouth at bedtime as needed (sleep).     . Multiple Vitamin (MULTIVITAMIN WITH MINERALS) TABS tablet Take 1 tablet by mouth daily.    . Omega-3 Fatty Acids (FISH OIL) 1200 MG CAPS Take 2,400 mg by mouth 2 (two) times daily.    . OXYGEN Inhale 2 L into the lungs as needed (for  shortness of breath with exertion only).     . pregabalin (LYRICA) 50 MG capsule Take 50 mg by mouth 2 (two) times daily.      . rosuvastatin (CRESTOR) 20 MG tablet Take 20 mg by mouth daily after supper.     . terazosin (HYTRIN) 5 MG capsule Take 10 mg by mouth 2 (two) times daily.    Marland Kitchen tiotropium (SPIRIVA) 18 MCG inhalation capsule Take 18 mcg by mouth daily.    . Turmeric 500 MG CAPS Take 500 mg by mouth daily.    . vitamin B-12 (CYANOCOBALAMIN) 1000 MCG tablet Take 1,000 mcg by mouth daily.    Marland Kitchen apixaban (ELIQUIS) 2.5 MG TABS tablet Take 1 tablet (2.5 mg total) by mouth 2 (two) times daily. 60 tablet 0  . furosemide (LASIX) 20 MG tablet One daily as needed for swelling (Patient taking differently: Take 20 mg  by mouth every other day. ) 90 tablet 2   No facility-administered medications prior to visit.      Allergies:   Latex; Ointment, white; and Tape   Social History   Social History  . Marital status: Divorced    Spouse name: N/A  . Number of children: N/A  . Years of education: 12+   Occupational History  . Retired     Social History Main Topics  . Smoking status: Former Smoker    Packs/day: 0.75    Years: 51.00    Types: Cigarettes    Quit date: 02/23/2000  . Smokeless tobacco: Never Used  . Alcohol use 0.0 - 0.6 oz/week     Comment: Moderate to little  . Drug use: No  . Sexual activity: Not Asked   Other Topics Concern  . None   Social History Narrative   Patient lives at home alone    Patient is retired    Patient has 3 years of college         Family History:  The patient's family history includes Diabetes in his brother, mother, and sister; Heart attack in his daughter; Heart disease in his mother; Hyperlipidemia in his brother; Hypertension in his brother, sister, and son.   ROS:   Please see the history of present illness.    ROS All other systems reviewed and are negative.   PHYSICAL EXAM:   VS:  BP (!) 150/78   Pulse (!) 46   Ht 5\' 9"  (1.753 m)   Wt 227 lb (103 kg)   SpO2 96%   BMI 33.52 kg/m    GEN: Well nourished, well developed, in no acute distress  HEENT:  normal  Neck: no JVD, carotid bruits, or masses Cardiac: Bradycardic; no murmurs, rubs, or gallops. 2-3+ bilateral LE edema  Respiratory:  Bibasilar crackle on exam GI: soft, nontender, + BS + distended MS: no deformity or atrophy  Skin: warm and dry, no rash Neuro:  Alert and Oriented x 3, Strength and sensation are intact Psych: euthymic mood, full affect  Wt Readings from Last 3 Encounters:  09/17/17 227 lb (103 kg)  09/13/17 215 lb (97.5 kg)  07/27/17 221 lb (100.2 kg)      Studies/Labs Reviewed:   EKG:  EKG is ordered today.  The ekg ordered today demonstrates Slow atrial fibrillation with heart rate 46  Recent Labs: 09/11/2017: ALT 18 09/12/2017: TSH 1.794 09/14/2017: Hemoglobin 12.5; Platelets 110 09/17/2017: BUN 26; Creatinine, Ser 1.67; NT-Pro BNP 5,156; Potassium 4.3; Sodium 142   Lipid Panel    Component Value Date/Time   CHOL 109 09/12/2017 0217   TRIG 102 09/12/2017 0217   HDL 43 09/12/2017 0217   CHOLHDL 2.5 09/12/2017 0217   VLDL 20 09/12/2017 0217   LDLCALC 46 09/12/2017 0217    Additional studies/ records that were reviewed today include:   Echo 09/13/2017 LV EF: 55%  Study Conclusions  - Left ventricle: The cavity size was normal. Wall thickness was   increased in a pattern of mild LVH. Indeterminant diastolic   function (atrial fibrillation). The estimated ejection fraction   was 55%. Wall motion was normal; there were no regional wall   motion abnormalities. - Ventricular septum: Mildly D-shaped interventricular septum   suggesting RV pressure/volume overload. - Aortic valve: There was no stenosis. - Aorta: Mildly dilated aortic root. Aortic root dimension: 38 mm   (ED). - Mitral valve: Mildly calcified annulus. There was trivial   regurgitation. - Left atrium:  The atrium was moderately dilated. - Right ventricle: The cavity size was mildly dilated. Systolic   function was normal. - Right atrium: The atrium was moderately dilated. -  Pulmonary arteries: PA peak pressure: 34 mm Hg (S). - Systemic veins: IVC measured 2.5 cm with < 50% respirophasic   variation, suggesting RA pressure 15 mmHg.  Impressions:  - The patient was in atrial fibrillation. Normal LV size with mild   LV hypertrophy. EF 55%. Mildly dilated RV with normal systolic   function. Mildly D-shaped interventricular septum suggesting RV   pressure/volume overload. Dilated IVC suggests elevated RV   filling pressure.   ASSESSMENT:    1. Acute on chronic diastolic heart failure ([place])   2. AAA (abdominal aortic aneurysm) without rupture ([place])   3. Coronary artery disease involving coronary bypass graft of native heart without angina pectoris   4. Essential hypertension   5. Paroxysmal atrial fibrillation ([facility])   6. SOB (shortness of breath)   7. Bradycardia   8. H/O: CVA (cerebrovascular accident)   9. Chest discomfort      PLAN:  In order of problems listed above:  1. Acute on chronic diastolic heart failure: It is unclear to me if why he is gaining fluid both in the abdomen, lung and also in the lower extremity. Most recent echocardiogram reviewed showing EF 55%. If he continued to accumulate fluid, I would have low threshold to repeat a limited echo. Unfortunately I am unable to aggressively diurese him due to significant bradycardia, he would not respond very well to aggressive diuresis. He is currently on 20 mg Lasix every other day, I have advised him to change Lasix to every day. I will obtain basic metabolic panel and BNP today. I suspect his BNP will be high.  2. CAD s/p 4v CABG: He does have very vague chest discomfort, he is unable to walk on treadmill, slow heart rate precluded him from [facility]. If he remain having chest discomfort after diuresis, he will likely need ischemic workup down the road. This is complicated by recent CVA.  3. Persistent atrial fibrillation with slow ventricular response: Heart rate remaining in the 40s,  recently seen by Dr. Caryl Comes after CVA, Dr. Caryl Comes recommended delay his maker placement for a few weeks to avoid excessive risk of another embolic stroke. He has upcoming visit with Kathlyn [place] for EP visit  4. Recent CVA: Occurred in the setting of atrial fibrillation. Currently on aspirin and eliquis.  5. Hypertension: His blood pressure is elevated today, however this is actually favorable for him given the slow heart rate. I will hold off on control the blood pressure for now.  6. Hypothyroidism: On Synthroid    Medication Adjustments/Labs and Tests Ordered: Current medicines are reviewed at length with the patient today.  Concerns regarding medicines are outlined above.  Medication changes, Labs and Tests ordered today are listed in the Patient Instructions below. Patient Instructions  Medication Instructions:  Your physician has recommended you make the following change in your medication:  1.  INCREASE the Lasix to 20 mg daily    Labwork: TODAY:  BMET & PRO BNP  Testing/Procedures: None ordered  Follow-Up: Your physician recommends that you schedule a follow-up appointment in: [place], PA-C  Please bring your weight diary with you.  Any Other Special Instructions Will Be Listed Below (If Applicable).  Heart Failure Prevention Instruction 1. Avoid salt 2. Limit total daily fluid intake to between 32 to 64 oz  3. Weigh yourself every morning and call cardiology if weight increase by more than 3 lbs overnight or 5 lbs in a single week   If you need a refill on your cardiac medications before your next appointment, please call your pharmacy.      Hilbert Corrigan, [place]  09/18/2017 3:42 PM    [facility] [address], [place], [place]  [address] Phone: [phone]; Fax: [phone]

## 2017-09-17 NOTE — Telephone Encounter (Signed)
New Message  Pt c/o swelling: STAT is pt has developed SOB within 24 hours  1) How much weight have you gained and in what time span? 10lb  2) If swelling, where is the swelling located? Both legs   3) Are you currently taking a fluid pill?  4) Are you currently SOB? Per pt daughter states the is some SOB but she feels its from  pts asthma.  Do you have a log of your daily weights (if so, list)? Pt was 215 returning home. Been home for three day and is now 225  5) Have you gained 3 pounds in a day or 5 pounds in a week? no  6) Have you traveled recently? no

## 2017-09-17 NOTE — Patient Instructions (Addendum)
Medication Instructions:  Your physician has recommended you make the following change in your medication:  1.  INCREASE the Lasix to 20 mg daily    Labwork: TODAY:  BMET & PRO BNP  Testing/Procedures: None ordered  Follow-Up: Your physician recommends that you schedule a follow-up appointment in: [place], PA-C  Please bring your weight diary with you.  Any Other Special Instructions Will Be Listed Below (If Applicable).  Heart Failure Prevention Instruction 1. Avoid salt 2. Limit total daily fluid intake to between 32 to 64 oz 3. Weigh yourself every morning and call cardiology if weight increase by more than 3 lbs overnight or 5 lbs in a single week   If you need a refill on your cardiac medications before your next appointment, please call your pharmacy.

## 2017-09-17 NOTE — Telephone Encounter (Signed)
Spoke with patients daughter, verbal ok given by patient. Patient has gained 10 pounds in 3 day, having lower extremity edema, and shortness of breath this am. Patient does have asthma, breathing treatment done and this shortness of breath better. Per daughter he has been really watching his diet. Appointment schedule with Janan Ridge PA at [facility] office this am.

## 2017-09-18 ENCOUNTER — Telehealth: Payer: Self-pay | Admitting: Student

## 2017-09-18 ENCOUNTER — Encounter: Payer: Self-pay | Admitting: Physician Assistant

## 2017-09-18 DIAGNOSIS — R300 Dysuria: Secondary | ICD-10-CM | POA: Diagnosis not present

## 2017-09-18 DIAGNOSIS — R34 Anuria and oliguria: Secondary | ICD-10-CM | POA: Diagnosis not present

## 2017-09-18 NOTE — Telephone Encounter (Signed)
   Patient's daughter called to report he has been experiencing painful urination and having a small stream of urine with each episode. However, he has urinated some since being started on PO Lasix at his visit yesterday as weight has declined 5 lbs on his home scales (224 --> 219 lbs). I recommended he contact his PCP and if they do not offer Saturday appointments then he proceed to an Urgent Care for further evaluation and testing with a Urinalysis as his symptoms are concerning for a UTI. He denies any fever, chills, or AMS. I made them aware that an Urgent Care might still recommend he proceed to the ED for evaluation.   She voiced understanding of this and was appreciative of the call.   Signed, Erma Heritage, PA-C 09/18/2017, 3:37 PM Pager: [phone]

## 2017-09-20 NOTE — Progress Notes (Signed)
Kidney function stable, proBNP high as expected. I have informed the patient myself over the phone

## 2017-09-21 ENCOUNTER — Other Ambulatory Visit (HOSPITAL_COMMUNITY): Payer: Self-pay | Admitting: Interventional Radiology

## 2017-09-21 DIAGNOSIS — I729 Aneurysm of unspecified site: Secondary | ICD-10-CM

## 2017-09-21 DIAGNOSIS — I771 Stricture of artery: Secondary | ICD-10-CM

## 2017-09-23 ENCOUNTER — Telehealth: Payer: Self-pay | Admitting: *Deleted

## 2017-09-23 ENCOUNTER — Ambulatory Visit (INDEPENDENT_AMBULATORY_CARE_PROVIDER_SITE_OTHER): Payer: Medicare Other | Admitting: Physician Assistant

## 2017-09-23 ENCOUNTER — Encounter: Payer: Self-pay | Admitting: Physician Assistant

## 2017-09-23 VITALS — BP 122/54 | HR 53 | Ht 69.0 in | Wt 214.0 lb

## 2017-09-23 DIAGNOSIS — I639 Cerebral infarction, unspecified: Secondary | ICD-10-CM

## 2017-09-23 DIAGNOSIS — I10 Essential (primary) hypertension: Secondary | ICD-10-CM | POA: Diagnosis not present

## 2017-09-23 DIAGNOSIS — I6340 Cerebral infarction due to embolism of unspecified cerebral artery: Secondary | ICD-10-CM

## 2017-09-23 DIAGNOSIS — I481 Persistent atrial fibrillation: Secondary | ICD-10-CM | POA: Diagnosis not present

## 2017-09-23 DIAGNOSIS — R808 Other proteinuria: Secondary | ICD-10-CM | POA: Diagnosis not present

## 2017-09-23 DIAGNOSIS — I4819 Other persistent atrial fibrillation: Secondary | ICD-10-CM

## 2017-09-23 DIAGNOSIS — E039 Hypothyroidism, unspecified: Secondary | ICD-10-CM

## 2017-09-23 DIAGNOSIS — I25810 Atherosclerosis of coronary artery bypass graft(s) without angina pectoris: Secondary | ICD-10-CM | POA: Diagnosis not present

## 2017-09-23 DIAGNOSIS — I5032 Chronic diastolic (congestive) heart failure: Secondary | ICD-10-CM

## 2017-09-23 DIAGNOSIS — Z79899 Other long term (current) drug therapy: Secondary | ICD-10-CM

## 2017-09-23 DIAGNOSIS — N189 Chronic kidney disease, unspecified: Secondary | ICD-10-CM

## 2017-09-23 DIAGNOSIS — R001 Bradycardia, unspecified: Secondary | ICD-10-CM | POA: Diagnosis not present

## 2017-09-23 LAB — BASIC METABOLIC PANEL
BUN / CREAT RATIO: 19 (ref 10–24)
BUN: 41 mg/dL — AB (ref 8–27)
CHLORIDE: 102 mmol/L (ref 96–106)
CO2: 24 mmol/L (ref 20–29)
Calcium: 9.8 mg/dL (ref 8.6–10.2)
Creatinine, Ser: 2.17 mg/dL — ABNORMAL HIGH (ref 0.76–1.27)
GFR calc non Af Amer: 28 mL/min/{1.73_m2} — ABNORMAL LOW (ref >59)
GFR, EST AFRICAN AMERICAN: 32 mL/min/{1.73_m2} — AB (ref >59)
Glucose: 86 mg/dL (ref 65–99)
POTASSIUM: 4.1 mmol/L (ref 3.5–5.2)
Sodium: 143 mmol/L (ref 134–144)

## 2017-09-23 MED ORDER — FUROSEMIDE 20 MG PO TABS: 20.0000 mg | ORAL_TABLET | ORAL | Status: DC

## 2017-09-23 NOTE — Patient Instructions (Addendum)
Medication Instructions:   No changes  Labwork:   BMET today  Testing/Procedures:  none  Follow-Up:  As scheduled with Tommye Standard PA 10/07/17 at 2pm  In 2-3 months with Dr. Tamala Julian  Please do not drive until after you have had your pacemaker put in.  We are referring you to Dr. Florene Glen with [facility]. Someone from their office will call you to arrange an appointment. If you have not heard anything in 2 weeks please call our office.   If you need a refill on your cardiac medications before your next appointment, please call your pharmacy.

## 2017-09-23 NOTE — Telephone Encounter (Signed)
-----   Message from [place], [place] sent at 09/23/2017  4:43 PM EDT ----- Kidney function worsened, concerned the lasix is too strong for him especially in light of slow heart rate, will decrease lasix back down to every other day.

## 2017-09-23 NOTE — Progress Notes (Addendum)
Cardiology Office Note    Date:  09/25/2017   ID:  Derek [name], DOB March 09, 1935, MRN [id number]  PCP:  Merrilee Seashore, MD  Cardiologist:  Dr. Tamala Julian Primary electrophysiologist: Dr. Caryl Comes  Chief Complaint  Patient presents with  . Follow-up    seen for Dr. Tamala Julian    History of Present Illness:  Derek [name] is a 82 y.o. male with PMH of CAD s/p 4v CABG 2007, HTN, hypothyroidism, OSA, COPD, AAA and recent CVA. 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 Post PTCA, he had atrial fibrillation which seems to be new was slow heart rate in the 40s. Holter monitors in 2015 demonstrated significant bradycardia due to heart block that improved after discontinuing beta blocker. Average heart rate was in the 60s. The heart monitor was done due to recurrent syncope at the time. During this admission he was seen by Dr. Caryl Comes on 09/13/2017, is suspected patient likely will require pacemaker due to chronotropic incompetence, he could not undergo treadmill testing. Due to the paucity of attributable symptom, Dr. Caryl Comes would prefer to wait a few weeks to avoid increasing bleeding issue. He was placed on eliquis and aspirin. Echocardiogram obtained on 09/13/2017 showed EF 55%, indeterminate diastolic function due to atrial fibrillation, mild LVH, mildly D-shaped interventricular septum suggesting RV pressure/volume overload, dilated aortic root measuring 38 mm, moderately dilated left atrium and right atrium, PA peak pressure 34 mmHg.  I last saw the patient on 09/09/2017, he was clearly volume overloaded at the time. However due to significant bradycardia, I was unable to aggressively diurese him. I changed his Lasix from 20 mg every other day to daily dosing. Lab work obtained that day showed BNP greater than 5000 consistent with physical exam all volume overload. Patient presents to cardiology office visit today. He has lost about 13 pounds since the last time I saw  him last week. He is currently on 20 mg daily of Lasix. I will obtain a basic metabolic panel. He continued to have 1+ pitting edema in bilateral lower extremity, however significantly improved compared to last week. He no longer has crackles in the lung. I think he is wondering overload symptoms have significantly improved. Furthermore, he has been seen by urgent care over the weekend for possible UTI, urinalysis did not show bacteria however it did show greater than 300 protein in the urine. Given the significant amount of proteinuria, he will need to be followed by a nephrologist. He says he may have seen Dr. Florene Glen in the distant past, we will refer him back to Dr. Florene Glen. Otherwise, he will see electrophysiology service in the next 2 weeks and the likely have a pacemaker implantation afterward. He can see Dr. Tamala Julian in 2-3 month    Past Medical History:  Diagnosis Date  . AAA (abdominal aortic aneurysm) ([place])   . Arthritis   . COPD (chronic obstructive pulmonary disease) ([place])   . Coronary artery disease   . Hypertension   . Hypothyroidism   . Myocardial infarction ([place])   . Shortness of breath    on exertion;  has productive cough at times  . Sleep apnea    states mild . no cpap    Past Surgical History:  Procedure Laterality Date  . APPENDECTOMY  1963  . CATARACT EXTRACTION     w/ lid rise  . CORONARY ARTERY BYPASS GRAFT  2007  . EYE SURGERY Right March 2016   Cataract  .  EYE SURGERY Bilateral May 2016   Eyebrow Lift  . EYE SURGERY Left June 2016   Cataract  . IR ANGIO INTRA EXTRACRAN SEL COM CAROTID INNOMINATE BILAT MOD SED  09/12/2017  . IR ANGIO VERTEBRAL SEL SUBCLAVIAN INNOMINATE UNI R MOD SED  09/12/2017  . KNEE ARTHROSCOPY  02/26/2012   Procedure: ARTHROSCOPY KNEE;  Surgeon: Sydnee Cabal, MD;  Location: [facility];  Service: Orthopedics;  Laterality: Left;  medial menisectomy and chondraplasty    Current Medications: Outpatient Medications Prior to Visit   Medication Sig Dispense Refill  . Acetaminophen (TYLENOL PO) Take 1-2 tablets by mouth every 6 (six) hours as needed (pain/headache).    Derrill Memo ON 10/12/2017] apixaban (ELIQUIS) 2.5 MG TABS tablet Take 1 tablet (2.5 mg total) by mouth 2 (two) times daily. 180 tablet 3  . aspirin EC 81 MG tablet Take 81 mg by mouth daily after supper.    . Biotin 5000 MCG TABS Take 5,000 mcg by mouth daily.    . budesonide-formoterol (SYMBICORT) 160-4.5 MCG/ACT inhaler Inhale 2 puffs into the lungs 2 (two) times daily. (Patient taking differently: Inhale 2 puffs into the lungs 2 (two) times daily as needed (shortness of breath/wheezing). ) 3 Inhaler 3  . Choline Fenofibrate (TRILIPIX) 135 MG capsule Take 135 mg by mouth daily after supper.     . ezetimibe (ZETIA) 10 MG tablet Take 10 mg by mouth daily after supper.     . famotidine (PEPCID) 20 MG tablet Take 20 mg by mouth daily as needed for heartburn or indigestion.     . Febuxostat (ULORIC) 80 MG TABS Take 80 mg by mouth daily after supper.    Marland Kitchen GLUCOSAMINE-CHONDROITIN PO Take 1 tablet by mouth 2 (two) times daily. 1500-1200    . levothyroxine (SYNTHROID, LEVOTHROID) 88 MCG tablet Take 88 mcg by mouth daily before breakfast.    . Magnesium 250 MG TABS Take 250 mg by mouth daily.    Marland Kitchen MELATONIN PO Take 1 tablet by mouth at bedtime as needed (sleep).     . Multiple Vitamin (MULTIVITAMIN WITH MINERALS) TABS tablet Take 1 tablet by mouth daily.    . Omega-3 Fatty Acids (FISH OIL) 1200 MG CAPS Take 2,400 mg by mouth 2 (two) times daily.    . OXYGEN Inhale 2 L into the lungs as needed (for  shortness of breath with exertion only).     . pregabalin (LYRICA) 50 MG capsule Take 50 mg by mouth 2 (two) times daily.    . rosuvastatin (CRESTOR) 20 MG tablet Take 20 mg by mouth daily after supper.     . terazosin (HYTRIN) 5 MG capsule Take 10 mg by mouth 2 (two) times daily.    Marland Kitchen tiotropium (SPIRIVA) 18 MCG inhalation capsule Take 18 mcg by mouth daily.    . Turmeric  500 MG CAPS Take 500 mg by mouth daily.    . vitamin B-12 (CYANOCOBALAMIN) 1000 MCG tablet Take 1,000 mcg by mouth daily.    . furosemide (LASIX) 20 MG tablet Take 1 tablet (20 mg total) by mouth daily. 90 tablet 3   No facility-administered medications prior to visit.      Allergies:   Latex; Ointment, white; and Tape   Social History   Social History  . Marital status: Divorced    Spouse name: N/A  . Number of children: N/A  . Years of education: 12+   Occupational History  . Retired     Social History Main Topics  .  Smoking status: Former Smoker    Packs/day: 0.75    Years: 51.00    Types: Cigarettes    Quit date: 02/23/2000  . Smokeless tobacco: Never Used  . Alcohol use 0.0 - 0.6 oz/week     Comment: Moderate to little  . Drug use: No  . Sexual activity: Not Asked   Other Topics Concern  . None   Social History Narrative   Patient lives at home alone    Patient is retired    Patient has 3 years of college         Family History:  The patient's family history includes Diabetes in his brother, mother, and sister; Heart attack in his daughter; Heart disease in his mother; Hyperlipidemia in his brother; Hypertension in his brother, sister, and son.   ROS:   Please see the history of present illness.    ROS All other systems reviewed and are negative.   PHYSICAL EXAM:   VS:  BP (!) 122/54   Pulse (!) 53   Ht 5\' 9"  (1.753 m)   Wt 214 lb (97.1 kg)   SpO2 92%   BMI 31.60 kg/m    GEN: Well nourished, well developed, in no acute distress  HEENT: normal  Neck: no JVD, carotid bruits, or masses Cardiac: Irregularly irregular; no murmurs, rubs, or gallops. 1+ edema  Respiratory:  clear to auscultation bilaterally, normal work of breathing GI: soft, nontender, nondistended, + BS MS: no deformity or atrophy  Skin: warm and dry, no rash Neuro:  Alert and Oriented x 3, Strength and sensation are intact Psych: euthymic mood, full affect  Wt Readings from Last 3  Encounters:  09/23/17 214 lb (97.1 kg)  09/17/17 227 lb (103 kg)  09/13/17 215 lb (97.5 kg)      Studies/Labs Reviewed:   EKG:  EKG is not ordered today.    Recent Labs: 09/11/2017: ALT 18 09/12/2017: TSH 1.794 09/14/2017: Hemoglobin 12.5; Platelets 110 09/17/2017: NT-Pro BNP 5,156 09/23/2017: BUN 41; Creatinine, Ser 2.17; Potassium 4.1; Sodium 143   Lipid Panel    Component Value Date/Time   CHOL 109 09/12/2017 0217   TRIG 102 09/12/2017 0217   HDL 43 09/12/2017 0217   CHOLHDL 2.5 09/12/2017 0217   VLDL 20 09/12/2017 0217   LDLCALC 46 09/12/2017 0217    Additional studies/ records that were reviewed today include:   Echo 09/13/2017 LV EF: 55%  ------------------------------------------------------------------- Indications:      CVA 436.  ------------------------------------------------------------------- History:   PMH:  AV block AAA.  Dyspnea.  Atrial fibrillation. Chronic obstructive pulmonary disease.  Risk factors: Dyslipidemia.  ------------------------------------------------------------------- Study Conclusions  - Left ventricle: The cavity size was normal. Wall thickness was   increased in a pattern of mild LVH. Indeterminant diastolic   function (atrial fibrillation). The estimated ejection fraction   was 55%. Wall motion was normal; there were no regional wall   motion abnormalities. - Ventricular septum: Mildly D-shaped interventricular septum   suggesting RV pressure/volume overload. - Aortic valve: There was no stenosis. - Aorta: Mildly dilated aortic root. Aortic root dimension: 38 mm   (ED). - Mitral valve: Mildly calcified annulus. There was trivial   regurgitation. - Left atrium: The atrium was moderately dilated. - Right ventricle: The cavity size was mildly dilated. Systolic   function was normal. - Right atrium: The atrium was moderately dilated. - Pulmonary arteries: PA peak pressure: 34 mm Hg (S). - Systemic veins: IVC measured 2.5 cm  with < 50% respirophasic  variation, suggesting RA pressure 15 mmHg.  Impressions:  - The patient was in atrial fibrillation. Normal LV size with mild   LV hypertrophy. EF 55%. Mildly dilated RV with normal systolic   function. Mildly D-shaped interventricular septum suggesting RV   pressure/volume overload. Dilated IVC suggests elevated RV   filling pressure.    ASSESSMENT:    1. Bradycardia   2. Other proteinuria   3. Chronic kidney disease, unspecified CKD stage   4. Coronary artery disease involving coronary bypass graft of native heart without angina pectoris   5. Essential hypertension   6. Hypothyroidism, unspecified type   7. Cerebrovascular accident (CVA) due to embolism of cerebral artery ([place])   8. Persistent atrial fibrillation ([place])   9. Chronic diastolic heart failure (HCC)      PLAN:  In order of problems listed above:  1. Bradycardia: Seen recently by Dr. Caryl Comes in the hospital, has atrial fibrillation with slow ventricular rate. Felt patient likely will require pacemaker however due to recent CVA, pacemaker implantation will need to be delayed for at least 3-4 weeks. The bradycardia is causing volume overload.  2. Chronic diastolic heart failure: Secondary to slow heart rate. I increased his Lasix from 20 mg every other day to 20 mg daily. His volume status has significantly improved, he has lost about 13 pounds, lower extremity edema largely resolved. Obtain basic metabolic panel today.  3. Persistent atrial fibrillation: On eliquis 2.5 mg twice a day  4. Recent CVA: In the setting of new atrial fibrillation.  5. CAD s/p CABG: No chest pain  6. Hypertension: Blood pressure well controlled  7. Hypothyroidism: On Synthroid.     Medication Adjustments/Labs and Tests Ordered: Current medicines are reviewed at length with the patient today.  Concerns regarding medicines are outlined above.  Medication changes, Labs and Tests ordered today are listed in  the Patient Instructions below. Patient Instructions  Medication Instructions:   No changes  Labwork:   BMET today  Testing/Procedures:  none  Follow-Up:  As scheduled with Tommye Standard PA 10/07/17 at 2pm  In 2-3 months with Dr. Tamala Julian  Please do not drive until after you have had your pacemaker put in.  We are referring you to Dr. Florene Glen with [facility] Someone from their office will call you to arrange an appointment. If you have not heard anything in 2 weeks please call our office.   If you need a refill on your cardiac medications before your next appointment, please call your pharmacy.     Hilbert Corrigan, [place]  09/25/2017 9:54 AM    [address], [place], [place]  [address] Phone: [phone]; Fax: [phone]

## 2017-09-23 NOTE — Telephone Encounter (Signed)
Discussed w Isaac Laud - recommendations relayed, along w Hao's verbal request to check BMET in 1 week's time. Pt voiced understanding, will enact changes to medications & return for recommended labs. Aware to call back if any questions.

## 2017-09-25 ENCOUNTER — Encounter: Payer: Self-pay | Admitting: Physician Assistant

## 2017-10-03 ENCOUNTER — Encounter (HOSPITAL_COMMUNITY): Payer: Self-pay | Admitting: Emergency Medicine

## 2017-10-03 ENCOUNTER — Emergency Department (HOSPITAL_COMMUNITY): Payer: Medicare Other

## 2017-10-03 ENCOUNTER — Observation Stay (HOSPITAL_COMMUNITY)
Admission: EM | Admit: 2017-10-03 | Discharge: 2017-10-04 | Disposition: A | Discharge status: Home / Self Care | Payer: Medicare Other | Attending: Internal Medicine | Admitting: Internal Medicine

## 2017-10-03 ENCOUNTER — Inpatient Hospital Stay (HOSPITAL_COMMUNITY): Payer: Medicare Other

## 2017-10-03 DIAGNOSIS — I481 Persistent atrial fibrillation: Secondary | ICD-10-CM | POA: Insufficient documentation

## 2017-10-03 DIAGNOSIS — M1990 Unspecified osteoarthritis, unspecified site: Secondary | ICD-10-CM | POA: Insufficient documentation

## 2017-10-03 DIAGNOSIS — G459 Transient cerebral ischemic attack, unspecified: Secondary | ICD-10-CM | POA: Diagnosis present

## 2017-10-03 DIAGNOSIS — N179 Acute kidney failure, unspecified: Secondary | ICD-10-CM | POA: Diagnosis not present

## 2017-10-03 DIAGNOSIS — Z882 Allergy status to sulfonamides status: Secondary | ICD-10-CM | POA: Diagnosis not present

## 2017-10-03 DIAGNOSIS — J449 Chronic obstructive pulmonary disease, unspecified: Secondary | ICD-10-CM | POA: Diagnosis present

## 2017-10-03 DIAGNOSIS — I4891 Unspecified atrial fibrillation: Secondary | ICD-10-CM | POA: Diagnosis not present

## 2017-10-03 DIAGNOSIS — Z7901 Long term (current) use of anticoagulants: Secondary | ICD-10-CM | POA: Diagnosis not present

## 2017-10-03 DIAGNOSIS — I6529 Occlusion and stenosis of unspecified carotid artery: Secondary | ICD-10-CM | POA: Diagnosis not present

## 2017-10-03 DIAGNOSIS — I10 Essential (primary) hypertension: Secondary | ICD-10-CM | POA: Diagnosis not present

## 2017-10-03 DIAGNOSIS — R29810 Facial weakness: Secondary | ICD-10-CM | POA: Diagnosis not present

## 2017-10-03 DIAGNOSIS — I6521 Occlusion and stenosis of right carotid artery: Secondary | ICD-10-CM | POA: Diagnosis not present

## 2017-10-03 DIAGNOSIS — E039 Hypothyroidism, unspecified: Secondary | ICD-10-CM | POA: Diagnosis not present

## 2017-10-03 DIAGNOSIS — Z951 Presence of aortocoronary bypass graft: Secondary | ICD-10-CM | POA: Diagnosis not present

## 2017-10-03 DIAGNOSIS — Z959 Presence of cardiac and vascular implant and graft, unspecified: Secondary | ICD-10-CM

## 2017-10-03 DIAGNOSIS — R202 Paresthesia of skin: Secondary | ICD-10-CM | POA: Diagnosis not present

## 2017-10-03 DIAGNOSIS — G4730 Sleep apnea, unspecified: Secondary | ICD-10-CM | POA: Insufficient documentation

## 2017-10-03 DIAGNOSIS — I639 Cerebral infarction, unspecified: Secondary | ICD-10-CM | POA: Diagnosis not present

## 2017-10-03 DIAGNOSIS — Z7982 Long term (current) use of aspirin: Secondary | ICD-10-CM | POA: Diagnosis not present

## 2017-10-03 DIAGNOSIS — I714 Abdominal aortic aneurysm, without rupture: Secondary | ICD-10-CM | POA: Insufficient documentation

## 2017-10-03 DIAGNOSIS — I480 Paroxysmal atrial fibrillation: Secondary | ICD-10-CM | POA: Diagnosis present

## 2017-10-03 DIAGNOSIS — N183 Chronic kidney disease, stage 3 (moderate): Secondary | ICD-10-CM | POA: Insufficient documentation

## 2017-10-03 DIAGNOSIS — R531 Weakness: Secondary | ICD-10-CM | POA: Diagnosis not present

## 2017-10-03 DIAGNOSIS — M109 Gout, unspecified: Secondary | ICD-10-CM | POA: Diagnosis not present

## 2017-10-03 DIAGNOSIS — Z87891 Personal history of nicotine dependence: Secondary | ICD-10-CM | POA: Insufficient documentation

## 2017-10-03 DIAGNOSIS — Z91040 Latex allergy status: Secondary | ICD-10-CM | POA: Diagnosis not present

## 2017-10-03 DIAGNOSIS — I252 Old myocardial infarction: Secondary | ICD-10-CM | POA: Diagnosis not present

## 2017-10-03 DIAGNOSIS — R2990 Unspecified symptoms and signs involving the nervous system: Secondary | ICD-10-CM

## 2017-10-03 DIAGNOSIS — I5032 Chronic diastolic (congestive) heart failure: Secondary | ICD-10-CM | POA: Diagnosis not present

## 2017-10-03 DIAGNOSIS — R42 Dizziness and giddiness: Secondary | ICD-10-CM | POA: Diagnosis not present

## 2017-10-03 DIAGNOSIS — Z8249 Family history of ischemic heart disease and other diseases of the circulatory system: Secondary | ICD-10-CM | POA: Diagnosis not present

## 2017-10-03 DIAGNOSIS — E785 Hyperlipidemia, unspecified: Secondary | ICD-10-CM | POA: Diagnosis not present

## 2017-10-03 DIAGNOSIS — R001 Bradycardia, unspecified: Secondary | ICD-10-CM | POA: Diagnosis present

## 2017-10-03 DIAGNOSIS — I130 Hypertensive heart and chronic kidney disease with heart failure and stage 1 through stage 4 chronic kidney disease, or unspecified chronic kidney disease: Secondary | ICD-10-CM | POA: Insufficient documentation

## 2017-10-03 DIAGNOSIS — I2510 Atherosclerotic heart disease of native coronary artery without angina pectoris: Secondary | ICD-10-CM | POA: Diagnosis not present

## 2017-10-03 DIAGNOSIS — Z7951 Long term (current) use of inhaled steroids: Secondary | ICD-10-CM | POA: Diagnosis not present

## 2017-10-03 DIAGNOSIS — I6789 Other cerebrovascular disease: Secondary | ICD-10-CM | POA: Diagnosis not present

## 2017-10-03 DIAGNOSIS — I6381 Other cerebral infarction due to occlusion or stenosis of small artery: Secondary | ICD-10-CM | POA: Diagnosis not present

## 2017-10-03 DIAGNOSIS — R200 Anesthesia of skin: Secondary | ICD-10-CM | POA: Diagnosis not present

## 2017-10-03 DIAGNOSIS — R29818 Other symptoms and signs involving the nervous system: Secondary | ICD-10-CM | POA: Diagnosis not present

## 2017-10-03 LAB — DIFFERENTIAL
BASOS PCT: 2 %
Basophils Absolute: 0.1 10*3/uL (ref 0.0–0.1)
EOS PCT: 5 %
Eosinophils Absolute: 0.3 10*3/uL (ref 0.0–0.7)
Lymphocytes Relative: 26 %
Lymphs Abs: 1.4 10*3/uL (ref 0.7–4.0)
MONO ABS: 0.5 10*3/uL (ref 0.1–1.0)
Monocytes Relative: 9 %
Neutro Abs: 3.2 10*3/uL (ref 1.7–7.7)
Neutrophils Relative %: 58 %

## 2017-10-03 LAB — I-STAT CHEM 8, ED
BUN: 47 mg/dL — AB (ref 6–20)
CALCIUM ION: 1.2 mmol/L (ref 1.15–1.40)
Chloride: 105 mmol/L (ref 101–111)
Creatinine, Ser: 2.5 mg/dL — ABNORMAL HIGH (ref 0.61–1.24)
GLUCOSE: 92 mg/dL (ref 65–99)
HCT: 42 % (ref 39.0–52.0)
Hemoglobin: 14.3 g/dL (ref 13.0–17.0)
Potassium: 4.3 mmol/L (ref 3.5–5.1)
SODIUM: 142 mmol/L (ref 135–145)
TCO2: 25 mmol/L (ref 22–32)

## 2017-10-03 LAB — PROTIME-INR
INR: 1.2
PROTHROMBIN TIME: 15.1 s (ref 11.4–15.2)

## 2017-10-03 LAB — CBC
HCT: 43 % (ref 39.0–52.0)
Hemoglobin: 13.9 g/dL (ref 13.0–17.0)
MCH: 30.6 pg (ref 26.0–34.0)
MCHC: 32.3 g/dL (ref 30.0–36.0)
MCV: 94.7 fL (ref 78.0–100.0)
PLATELETS: 173 10*3/uL (ref 150–400)
RBC: 4.54 MIL/uL (ref 4.22–5.81)
RDW: 14.4 % (ref 11.5–15.5)
WBC: 5.6 10*3/uL (ref 4.0–10.5)

## 2017-10-03 LAB — COMPREHENSIVE METABOLIC PANEL
ALT: 28 U/L (ref 17–63)
ANION GAP: 7 (ref 5–15)
AST: 46 U/L — ABNORMAL HIGH (ref 15–41)
Albumin: 3.8 g/dL (ref 3.5–5.0)
Alkaline Phosphatase: 40 U/L (ref 38–126)
BUN: 50 mg/dL — ABNORMAL HIGH (ref 6–20)
CALCIUM: 9.6 mg/dL (ref 8.9–10.3)
CHLORIDE: 106 mmol/L (ref 101–111)
CO2: 26 mmol/L (ref 22–32)
Creatinine, Ser: 2.52 mg/dL — ABNORMAL HIGH (ref 0.61–1.24)
GFR calc non Af Amer: 23 mL/min — ABNORMAL LOW (ref >60)
GFR, EST AFRICAN AMERICAN: 26 mL/min — AB (ref >60)
Glucose, Bld: 96 mg/dL (ref 65–99)
Potassium: 4.4 mmol/L (ref 3.5–5.1)
SODIUM: 139 mmol/L (ref 135–145)
Total Bilirubin: 1.1 mg/dL (ref 0.3–1.2)
Total Protein: 6.9 g/dL (ref 6.5–8.1)

## 2017-10-03 LAB — CBG MONITORING, ED: GLUCOSE-CAPILLARY: 92 mg/dL (ref 65–99)

## 2017-10-03 LAB — I-STAT TROPONIN, ED: TROPONIN I, POC: 0.01 ng/mL (ref 0.00–0.08)

## 2017-10-03 LAB — APTT: aPTT: 34 seconds (ref 24–36)

## 2017-10-03 MED ORDER — TIOTROPIUM BROMIDE MONOHYDRATE 18 MCG IN CAPS
18.0000 ug | ORAL_CAPSULE | Freq: Every day | RESPIRATORY_TRACT | Status: DC
  Administered 2017-10-04: 18 ug via RESPIRATORY_TRACT
  Filled 2017-10-03: qty 5

## 2017-10-03 MED ORDER — VITAMIN B-12 1000 MCG PO TABS
1000.0000 ug | ORAL_TABLET | Freq: Every day | ORAL | Status: DC
  Administered 2017-10-03: 1000 ug via ORAL
  Filled 2017-10-03 (×2): qty 1

## 2017-10-03 MED ORDER — LEVOTHYROXINE SODIUM 88 MCG PO TABS
88.0000 ug | ORAL_TABLET | Freq: Every day | ORAL | Status: DC
  Administered 2017-10-04: 88 ug via ORAL
  Filled 2017-10-03: qty 1

## 2017-10-03 MED ORDER — MAGNESIUM 250 MG PO TABS: 250.0000 mg | ORAL_TABLET | Freq: Every day | ORAL | Status: DC

## 2017-10-03 MED ORDER — PREGABALIN 50 MG PO CAPS
50.0000 mg | ORAL_CAPSULE | Freq: Two times a day (BID) | ORAL | Status: DC
  Administered 2017-10-04: 50 mg via ORAL
  Filled 2017-10-03 (×2): qty 1

## 2017-10-03 MED ORDER — SODIUM CHLORIDE 0.9 % IV SOLN
INTRAVENOUS | Status: AC
  Administered 2017-10-03: 17:00:00 via INTRAVENOUS

## 2017-10-03 MED ORDER — ACETAMINOPHEN 650 MG RE SUPP: 650.0000 mg | RECTAL | Status: DC | PRN

## 2017-10-03 MED ORDER — EZETIMIBE 10 MG PO TABS
10.0000 mg | ORAL_TABLET | Freq: Every day | ORAL | Status: DC
  Administered 2017-10-03: 10 mg via ORAL
  Filled 2017-10-03: qty 1

## 2017-10-03 MED ORDER — HYDRALAZINE HCL 20 MG/ML IJ SOLN: 10.0000 mg | Freq: Three times a day (TID) | INTRAMUSCULAR | Status: DC | PRN

## 2017-10-03 MED ORDER — HYDROCORTISONE 1 % EX CREA
TOPICAL_CREAM | Freq: Three times a day (TID) | CUTANEOUS | Status: DC
  Administered 2017-10-04: 1 via TOPICAL
  Filled 2017-10-03: qty 28

## 2017-10-03 MED ORDER — ROSUVASTATIN CALCIUM 10 MG PO TABS
20.0000 mg | ORAL_TABLET | Freq: Every day | ORAL | Status: DC
  Administered 2017-10-03: 20 mg via ORAL
  Filled 2017-10-03 (×2): qty 1
  Filled 2017-10-03: qty 2

## 2017-10-03 MED ORDER — BIOTIN 5000 MCG PO TABS: 5000.0000 ug | ORAL_TABLET | Freq: Every day | ORAL | Status: DC

## 2017-10-03 MED ORDER — ADULT MULTIVITAMIN W/MINERALS CH
1.0000 | ORAL_TABLET | Freq: Every day | ORAL | Status: DC
  Administered 2017-10-03: 1 via ORAL
  Filled 2017-10-03 (×2): qty 1

## 2017-10-03 MED ORDER — FEBUXOSTAT 40 MG PO TABS
40.0000 mg | ORAL_TABLET | Freq: Every evening | ORAL | Status: DC
  Administered 2017-10-03: 40 mg via ORAL
  Filled 2017-10-03: qty 1

## 2017-10-03 MED ORDER — MAGNESIUM GLUCONATE 500 MG PO TABS
250.0000 mg | ORAL_TABLET | Freq: Every day | ORAL | Status: DC
  Administered 2017-10-03 – 2017-10-04 (×2): 250 mg via ORAL
  Filled 2017-10-03 (×2): qty 1

## 2017-10-03 MED ORDER — APIXABAN 2.5 MG PO TABS: 2.5000 mg | ORAL_TABLET | Freq: Two times a day (BID) | ORAL | Status: DC

## 2017-10-03 MED ORDER — ASPIRIN EC 81 MG PO TBEC
81.0000 mg | DELAYED_RELEASE_TABLET | Freq: Every day | ORAL | Status: DC
  Administered 2017-10-03: 81 mg via ORAL
  Filled 2017-10-03: qty 1

## 2017-10-03 MED ORDER — TURMERIC 500 MG PO CAPS: 500.0000 mg | ORAL_CAPSULE | Freq: Every day | ORAL | Status: DC

## 2017-10-03 MED ORDER — STROKE: EARLY STAGES OF RECOVERY BOOK
Freq: Once | Status: AC
  Administered 2017-10-03: 17:00:00
  Filled 2017-10-03: qty 1

## 2017-10-03 MED ORDER — ACETAMINOPHEN 160 MG/5ML PO SOLN: 650.0000 mg | ORAL | Status: DC | PRN

## 2017-10-03 MED ORDER — MOMETASONE FURO-FORMOTEROL FUM 200-5 MCG/ACT IN AERO
2.0000 | INHALATION_SPRAY | Freq: Two times a day (BID) | RESPIRATORY_TRACT | Status: DC
  Administered 2017-10-03 – 2017-10-04 (×2): 2 via RESPIRATORY_TRACT
  Filled 2017-10-03: qty 8.8

## 2017-10-03 MED ORDER — APIXABAN 2.5 MG PO TABS
2.5000 mg | ORAL_TABLET | Freq: Two times a day (BID) | ORAL | Status: DC
  Administered 2017-10-03: 2.5 mg via ORAL
  Filled 2017-10-03 (×2): qty 1

## 2017-10-03 MED ORDER — FEBUXOSTAT 80 MG PO TABS: 80.0000 mg | ORAL_TABLET | Freq: Every day | ORAL | Status: DC

## 2017-10-03 MED ORDER — ACETAMINOPHEN 325 MG PO TABS: 650.0000 mg | ORAL_TABLET | ORAL | Status: DC | PRN

## 2017-10-03 NOTE — H&P (Signed)
[facility] History and Physical  OMA ALPERT KDX:[id number] DOB: 07-30-1935 DOA: 10/03/2017  Referring physician:  PCP: Merrilee Seashore, MD  Specialists:   Chief Complaint:   HPI: Derek [name] is a 82 y.o. male with PMH of CAD h/o CABG, COPD, HTN, Hypothyroidism, Gout, Recent Dx of afib with embolic stroke, (9/76-7/34), bradycardia is presented with left sided numbness. Patient states that he awoke this morning with left sided weakness and numbness. He also reports dizziness and left sided facial weakness. He denies weakness in his extremities. He told his son to call EMS. His symptoms almost resolve in ED. He denies acute changes in his vision, no diplopia, no dysphasia, no dysarthria. He is taking eliques and aspirin at home since recent admission. He denies nausea, vomiting or diarrhea.  -ED: CT head: no acute infarcts. Neurology recommended inpatient monitoring, obtain mri. Noted to have bradycardia   Review of Systems: The patient denies anorexia, fever, weight loss,, vision loss, decreased hearing, hoarseness, chest pain, syncope, dyspnea on exertion, peripheral edema, balance deficits, hemoptysis, abdominal pain, melena, hematochezia, severe indigestion/heartburn, hematuria, incontinence, genital sores, muscle weakness, suspicious skin lesions, transient blindness, difficulty walking, depression, unusual weight change, abnormal bleeding, enlarged lymph nodes, angioedema, and breast masses.    Past Medical History:  Diagnosis Date  . AAA (abdominal aortic aneurysm) ([place])   . Arthritis   . COPD (chronic obstructive pulmonary disease) ([place])   . Coronary artery disease   . Hypertension   . Hypothyroidism   . Myocardial infarction ([place])   . Shortness of breath    on exertion;  has productive cough at times  . Sleep apnea    states mild . no cpap   Past Surgical History:  Procedure Laterality Date  . APPENDECTOMY  1963  . CATARACT EXTRACTION     w/ lid rise  .  CORONARY ARTERY BYPASS GRAFT  2007  . EYE SURGERY Right March 2016   Cataract  . EYE SURGERY Bilateral May 2016   Eyebrow Lift  . EYE SURGERY Left June 2016   Cataract  . IR ANGIO INTRA EXTRACRAN SEL COM CAROTID INNOMINATE BILAT MOD SED  09/12/2017  . IR ANGIO VERTEBRAL SEL SUBCLAVIAN INNOMINATE UNI R MOD SED  09/12/2017  . KNEE ARTHROSCOPY  02/26/2012   Procedure: ARTHROSCOPY KNEE;  Surgeon: Sydnee Cabal, MD;  Location: [facility];  Service: Orthopedics;  Laterality: Left;  medial menisectomy and chondraplasty   Social History:  reports that he quit smoking about 17 years ago. His smoking use included Cigarettes. He has a 38.25 pack-year smoking history. He has never used smokeless tobacco. He reports that he drinks alcohol. He reports that he does not use drugs. Home;  where does patient live--home, ALF, SNF? and with whom if at home? Yes;  Can patient participate in ADLs?  Allergies  Allergen Reactions  . Other Hives and Dermatitis    -mycins  . Sulfa Antibiotics Rash  . Latex Rash  . Tape Rash    Reaction to adhesive tape - pls use paper tape    Family History  Problem Relation Age of Onset  . Diabetes Mother   . Heart disease Mother        Before age 28  . Diabetes Sister   . Hypertension Sister   . Diabetes Brother   . Hypertension Brother   . Hyperlipidemia Brother   . Hypertension Son   . Heart attack Daughter     (be sure to complete)  Prior  to Admission medications   Medication Sig Start Date End Date Taking? Authorizing Provider  apixaban (ELIQUIS) 2.5 MG TABS tablet Take 1 tablet (2.5 mg total) by mouth 2 (two) times daily. 10/12/17  Yes Rosalin Hawking, MD  aspirin EC 81 MG tablet Take 81 mg by mouth daily after supper.   Yes [provider]  Biotin 5000 MCG TABS Take 5,000 mcg by mouth daily.   Yes [provider]  budesonide-formoterol (SYMBICORT) 160-4.5 MCG/ACT inhaler Inhale 2 puffs into the lungs 2 (two) times daily. 08/02/17   Yes Tanda Rockers, MD  Choline Fenofibrate (TRILIPIX) 135 MG capsule Take 135 mg by mouth daily after supper.    Yes [provider]  ezetimibe (ZETIA) 10 MG tablet Take 10 mg by mouth daily after supper.    Yes [provider]  Febuxostat (ULORIC) 80 MG TABS Take 80 mg by mouth daily after supper.   Yes [provider]  furosemide (LASIX) 20 MG tablet Take 1 tablet (20 mg total) by mouth every other day. 09/23/17 12/22/17 Yes Meng, Isaac Laud, PA  GLUCOSAMINE-CHONDROITIN PO Take 1 tablet by mouth 2 (two) times daily. 1500-1200   Yes [provider]  levothyroxine (SYNTHROID, LEVOTHROID) 88 MCG tablet Take 88 mcg by mouth daily before breakfast.   Yes [provider]  Magnesium 250 MG TABS Take 250 mg by mouth daily.   Yes [provider]  Multiple Vitamin (MULTIVITAMIN WITH MINERALS) TABS tablet Take 1 tablet by mouth daily.   Yes [provider]  Omega-3 Fatty Acids (FISH OIL) 1200 MG CAPS Take 2,400 mg by mouth 2 (two) times daily.   Yes [provider]  OXYGEN Inhale 2 L into the lungs as needed (for  shortness of breath with exertion only).    Yes [provider]  pregabalin (LYRICA) 50 MG capsule Take 50 mg by mouth 2 (two) times daily.   Yes [provider]  rosuvastatin (CRESTOR) 20 MG tablet Take 20 mg by mouth daily after supper.    Yes [provider]  terazosin (HYTRIN) 5 MG capsule Take 10 mg by mouth 2 (two) times daily. 08/30/17  Yes [provider]  tiotropium (SPIRIVA) 18 MCG inhalation capsule Take 18 mcg by mouth daily.   Yes [provider]  Turmeric 500 MG CAPS Take 500 mg by mouth daily.   Yes [provider]  vitamin B-12 (CYANOCOBALAMIN) 1000 MCG tablet Take 1,000 mcg by mouth daily.   Yes [provider]   Physical Exam: Vitals:   10/03/17 1315 10/03/17 1330  BP: (!) 158/61 (!) 146/68  Pulse: (!) 48 (!) 42  Resp: 20 12  Temp:    SpO2: (!) 89%  94%     General:  Alert. No distress   Eyes: eom-I, perrla   ENT: no oral ulcers   Neck: supple, nmo JVD  Cardiovascular: s1,s2 bradycardia   Respiratory: CTA BL  Abdomen: soft, nt, nd   Skin: no rashj   Musculoskeletal: no leg edema   Psychiatric: no hallucinations   Neurologic: left facial droop. Decreased sensory in let leg. Motor 5/5 BL.   Labs on Admission:  Basic Metabolic Panel:  Recent Labs Lab 10/03/17 1153 10/03/17 1206  NA 139 142  K 4.4 4.3  CL 106 105  CO2 26  --   GLUCOSE 96 92  BUN 50* 47*  CREATININE 2.52* 2.50*  CALCIUM 9.6  --    Liver Function Tests:  Recent Labs Lab 10/03/17  1153  AST 46*  ALT 28  ALKPHOS 40  BILITOT 1.1  PROT 6.9  ALBUMIN 3.8   No results for input(s): LIPASE, AMYLASE in the last 168 hours. No results for input(s): AMMONIA in the last 168 hours. CBC:  Recent Labs Lab 10/03/17 1153 10/03/17 1206  WBC 5.6  --   NEUTROABS 3.2  --   HGB 13.9 14.3  HCT 43.0 42.0  MCV 94.7  --   PLT 173  --    Cardiac Enzymes: No results for input(s): CKTOTAL, CKMB, CKMBINDEX, TROPONINI in the last 168 hours.  BNP (last 3 results) No results for input(s): BNP in the last 8760 hours.  ProBNP (last 3 results)  Recent Labs  09/17/17 1141  PROBNP 5,156*    CBG:  Recent Labs Lab 10/03/17 1159  GLUCAP 92    Radiological Exams on Admission: Ct Head Code Stroke Wo Contrast  Result Date: 10/03/2017 CLINICAL DATA:  Code stroke. Right facial droop. Left lower extremity drift. EXAM: CT HEAD WITHOUT CONTRAST TECHNIQUE: Contiguous axial images were obtained from the base of the skull through the vertex without intravenous contrast. COMPARISON:  Brain MRI 09/12/2017 FINDINGS: Brain: No evidence of acute infarction, hemorrhage, hydrocephalus, extra-axial collection or mass lesion/mass effect. Vascular: No hyperdense vessel. Atherosclerotic calcification. Tortuosity of the vertebrobasilar system which is strongly dominant to  the right Skull: No acute finding Sinuses/Orbits: No acute finding.  Bilateral cataract resection. Other: Text page with prelim sent on 10/03/2017 at 11:19 am to Dr. Orlena Sheldon ASPECTS [facility] Stroke Program Early CT Score) - Ganglionic level infarction (caudate, lentiform nuclei, internal capsule, insula, M1-M3 cortex): 7 - Supraganglionic infarction (M4-M6 cortex): 3 Total score (0-10 with 10 being normal): 10 IMPRESSION: No acute finding or change from 09/11/2017. Electronically Signed   By: Monte Fantasia M.D.   On: 10/03/2017 11:21    EKG: Independently reviewed.   Assessment/Plan Active Problems:   Faintness   COLD (chronic obstructive lung disease) (HCC)   Bradycardia   Essential hypertension   Paroxysmal atrial fibrillation (HCC)   Carotid stenosis  82 y.o. male with PMH of CAD h/o CABG, COPD, HTN, Hypothyroidism, Gout, Recent Dx of afib with embolic stroke, (6/37-8/58), bradycardia is presented with left sided numbness.  Left sided paresthesias. Suspected TIA. Head ct showed no acute infarcts. Recent CTA neck: showed 85% rica, 02% lica. Echo. lvef 55%.  -will obtain mri brain. Cont eliques, aspirin.  Bradycardia is likely contributing to his recurrent TIAs. Consulted cardiology evaluation. Neurology is following   CAD h/o CABG. No acute cardiopulmonary symptoms. Cont home regimen. Hold lasix today. Patient is euvolemic  Afib with embolic DXAJOI(7/86-7/67), bradycardia. Possible sinus node dysfunction. Consulted cardiology for PPM on this admission. Will monitor on tele HTN. Hold terazosin due to recurrent TIA. BP is stable at this time. Will restart oral agent AM as needed.   COPD. Stable, no s/s of exacerbation. Cont home regimen    Neurology. Cardiology;  if consultant consulted, please document name and whether formally or informally consulted  Code Status: full (must indicate code status--if unknown or must be presumed, indicate so) Family Communication: d/w patient, ED  (indicate person spoken with, if applicable, with phone number if by telephone) Disposition Plan: home 2-3 days  (indicate anticipated LOS)  Time spent: >45 minutes   Kinnie Feil [facility] Pager [phone] for 09/30/17  If 7PM-7AM, please contact night-coverage [email or web address] Password [email or web address] 10/03/2017, 2:08 PM

## 2017-10-03 NOTE — Consult Note (Signed)
Reason for Consult: Code Stroke Referring Physician: ER  Derek [name] is an 82 y.o. male.  HPI: Patient is very poor historian and no family here.  Per page, LSN 9 am with right sided weakness per EMS although family had said left sided.  CT Brain shows small vessel ischemic disease with no acute changes.  CTA Brain and Neck in 08/2017 showed 63% RICA and 01% LICA stenosis as well as extensive stenosis in vertebrobasilar systems.  He has a history of AAA.  He admits to being dizzy and lightheaded before symptoms this am.  His HR is in the 30's with relatively good BP.   Past Medical History:  Diagnosis Date  . AAA (abdominal aortic aneurysm) ([place])   . Arthritis   . COPD (chronic obstructive pulmonary disease) ([place])   . Coronary artery disease   . Hypertension   . Hypothyroidism   . Myocardial infarction ([place])   . Shortness of breath    on exertion;  has productive cough at times  . Sleep apnea    states mild . no cpap    Past Surgical History:  Procedure Laterality Date  . APPENDECTOMY  1963  . CATARACT EXTRACTION     w/ lid rise  . CORONARY ARTERY BYPASS GRAFT  2007  . EYE SURGERY Right March 2016   Cataract  . EYE SURGERY Bilateral May 2016   Eyebrow Lift  . EYE SURGERY Left June 2016   Cataract  . IR ANGIO INTRA EXTRACRAN SEL COM CAROTID INNOMINATE BILAT MOD SED  09/12/2017  . IR ANGIO VERTEBRAL SEL SUBCLAVIAN INNOMINATE UNI R MOD SED  09/12/2017  . KNEE ARTHROSCOPY  02/26/2012   Procedure: ARTHROSCOPY KNEE;  Surgeon: Sydnee Cabal, MD;  Location: [facility];  Service: Orthopedics;  Laterality: Left;  medial menisectomy and chondraplasty    Family History  Problem Relation Age of Onset  . Diabetes Mother   . Heart disease Mother        Before age 29  . Diabetes Sister   . Hypertension Sister   . Diabetes Brother   . Hypertension Brother   . Hyperlipidemia Brother   . Hypertension Son   . Heart attack Daughter     Social History:  reports that he  quit smoking about 17 years ago. His smoking use included Cigarettes. He has a 38.25 pack-year smoking history. He has never used smokeless tobacco. He reports that he drinks alcohol. He reports that he does not use drugs.  Allergies:  Allergies  Allergen Reactions  . Latex Rash  . Ointment, White Rash    Mycins ointment and pills  . Tape Rash    Reaction to adhesive tape - pls use paper tape    Prior to Admission medications   Medication Sig Start Date End Date Taking? Authorizing Provider  Acetaminophen (TYLENOL PO) Take 1-2 tablets by mouth every 6 (six) hours as needed (pain/headache).    [provider]  apixaban (ELIQUIS) 2.5 MG TABS tablet Take 1 tablet (2.5 mg total) by mouth 2 (two) times daily. 10/12/17   Rosalin Hawking, MD  aspirin EC 81 MG tablet Take 81 mg by mouth daily after supper.    [provider]  Biotin 5000 MCG TABS Take 5,000 mcg by mouth daily.    [provider]  budesonide-formoterol (SYMBICORT) 160-4.5 MCG/ACT inhaler Inhale 2 puffs into the lungs 2 (two) times daily. Patient taking differently: Inhale 2 puffs into the lungs 2 (two) times daily as  needed (shortness of breath/wheezing).  08/02/17   Tanda Rockers, MD  Choline Fenofibrate (TRILIPIX) 135 MG capsule Take 135 mg by mouth daily after supper.     [provider]  ezetimibe (ZETIA) 10 MG tablet Take 10 mg by mouth daily after supper.     [provider]  famotidine (PEPCID) 20 MG tablet Take 20 mg by mouth daily as needed for heartburn or indigestion.     [provider]  Febuxostat (ULORIC) 80 MG TABS Take 80 mg by mouth daily after supper.    [provider]  furosemide (LASIX) 20 MG tablet Take 1 tablet (20 mg total) by mouth every other day. 09/23/17 12/22/17  Almyra Deforest, PA  GLUCOSAMINE-CHONDROITIN PO Take 1 tablet by mouth 2 (two) times daily. 1500-1200    [provider]  levothyroxine (SYNTHROID, LEVOTHROID) 88 MCG tablet Take 88 mcg  by mouth daily before breakfast.    [provider]  Magnesium 250 MG TABS Take 250 mg by mouth daily.    [provider]  MELATONIN PO Take 1 tablet by mouth at bedtime as needed (sleep).     [provider]  Multiple Vitamin (MULTIVITAMIN WITH MINERALS) TABS tablet Take 1 tablet by mouth daily.    [provider]  Omega-3 Fatty Acids (FISH OIL) 1200 MG CAPS Take 2,400 mg by mouth 2 (two) times daily.    [provider]  OXYGEN Inhale 2 L into the lungs as needed (for  shortness of breath with exertion only).     [provider]  pregabalin (LYRICA) 50 MG capsule Take 50 mg by mouth 2 (two) times daily.    [provider]  rosuvastatin (CRESTOR) 20 MG tablet Take 20 mg by mouth daily after supper.     [provider]  terazosin (HYTRIN) 5 MG capsule Take 10 mg by mouth 2 (two) times daily. 08/30/17   [provider]  tiotropium (SPIRIVA) 18 MCG inhalation capsule Take 18 mcg by mouth daily.    [provider]  Turmeric 500 MG CAPS Take 500 mg by mouth daily.    [provider]  vitamin B-12 (CYANOCOBALAMIN) 1000 MCG tablet Take 1,000 mcg by mouth daily.    [provider]    Medications: Prior to Admission:  (Not in a hospital admission)  No results found for this or any previous visit (from the past 48 hour(s)).  Ct Head Code Stroke Wo Contrast  Result Date: 10/03/2017 CLINICAL DATA:  Code stroke. Right facial droop. Left lower extremity drift. EXAM: CT HEAD WITHOUT CONTRAST TECHNIQUE: Contiguous axial images were obtained from the base of the skull through the vertex without intravenous contrast. COMPARISON:  Brain MRI 09/12/2017 FINDINGS: Brain: No evidence of acute infarction, hemorrhage, hydrocephalus, extra-axial collection or mass lesion/mass effect. Vascular: No hyperdense vessel. Atherosclerotic calcification. Tortuosity of the vertebrobasilar system which is strongly dominant to  the right Skull: No acute finding Sinuses/Orbits: No acute finding.  Bilateral cataract resection. Other: Text page with prelim sent on 10/03/2017 at 11:19 am to Dr. Orlena Sheldon ASPECTS [facility] Stroke Program Early CT Score) - Ganglionic level infarction (caudate, lentiform nuclei, internal capsule, insula, M1-M3 cortex): 7 - Supraganglionic infarction (M4-M6 cortex): 3 Total score (0-10 with 10 being normal): 10 IMPRESSION: No acute finding or change from 09/11/2017. Electronically Signed   By: Monte Fantasia M.D.   On: 10/03/2017 11:21    ROS There were no vitals taken for this visit. Neurologic Examination:  Awake, alert,  oriented to hospital, year, month, day. Language -fluent. Comprehension, repetition, naming - intact. Very subtle right lower facial weakness. Left UE pronator drift and mild reduced grip.  LLE weakness.  Sensory- inconsistent.   FTN intact, but abnormal with eyes closed.  Assessment/Plan:  Possible brainstem TIA or small stroke based on crossed signs on exam.  He has extensive intracranial atherosclerosis.  Due to AAA, he is not a candidate for IV tPA.  I believe that his bradycardia caused some blood flow issues leading to neurological symptoms.  Cardiology should evaluate him for possible pacemaker.    Admit for MRI Brain.  Antiplatelet therapy.  TTE.  He has had recent vascular imaging so no further testing necessary.  He may be considered for right CEA but that would depend on his comorbidities and surgical risk.    Rogue Jury, MD 10/03/2017, 11:29 AM

## 2017-10-03 NOTE — ED Provider Notes (Signed)
[facility] DEPT Provider Note   CSN: [id number] Arrival date & time: 10/03/17  1050    History   Chief Complaint Chief Complaint  Patient presents with  . Code Stroke  . Bradycardia    HPI Derek [name] is a 82 y.o. male.  The history is provided by the patient and medical records. No language interpreter was used.   Derek [name] is a 82 y.o. 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 Patient states that he awoke this morning with left sided numbness and weakness to arm/leg. He also felt lightheaded during episode as well. He states that he could move the arm, but it felt "dead" and his motions were limited. He told his son who subsequently called EMS. Patient states that his left arm still feels "tingly" but he is able to move the extremities much better than he was able to earlier. He is on Eliquis which is took last night. He did not take any morning meds today. Denies chest pain, abdominal pain, shortness of breath, fever, chills, n/v, trouble speaking. Per EMS, patient unsteady on his feet.   Past Medical History:  Diagnosis Date  . AAA (abdominal aortic aneurysm) ([place])   . Arthritis   . COPD (chronic obstructive pulmonary disease) ([place])   . Coronary artery disease   . Hypertension   . Hypothyroidism   . Myocardial infarction ([place])   . Shortness of breath    on exertion;  has productive cough at times  . Sleep apnea    states mild . no cpap    Patient Active Problem List   Diagnosis Date Noted  . Paroxysmal atrial fibrillation ([place]) 09/12/2017  . Carotid stenosis 09/12/2017  . Vertebral basilar insufficiency 09/12/2017  . Acute ischemic stroke ([place])   . Persistent atrial fibrillation ([place])   . AV block   . Hyperlipidemia   . Essential hypertension   . Hx of CABG   . 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  Chronic respiratory failure with hypoxia ([place]) 02/17/2017  . Morbid obesity ([place]) 04/04/2016  . Leg swelling 04/04/2016  . COPD GOLD II  04/02/2016  . AAA (abdominal aortic aneurysm) without rupture ([place]) 10/01/2014  . Coronary artery disease involving coronary bypass graft of native heart without angina pectoris 07/24/2014  . Dyspnea 07/24/2014  . Faintness 07/24/2014  . COLD (chronic obstructive lung disease) ([place]) 07/24/2014  . Bradycardia 07/24/2014  . Vertigo 07/24/2014  . Aorto-iliac disease ([place]) 09/28/2013  . Abdominal aneurysm without mention of rupture 09/08/2012    Past Surgical History:  Procedure Laterality Date  . APPENDECTOMY  1963  . CATARACT EXTRACTION     w/ lid rise  . CORONARY ARTERY BYPASS GRAFT  2007  . EYE SURGERY Right March 2016   Cataract  . EYE SURGERY Bilateral May 2016   Eyebrow Lift  . EYE SURGERY Left June 2016   Cataract  . IR ANGIO INTRA EXTRACRAN SEL COM CAROTID INNOMINATE BILAT MOD SED  09/12/2017  . IR ANGIO VERTEBRAL SEL SUBCLAVIAN INNOMINATE UNI R MOD SED  09/12/2017  . KNEE ARTHROSCOPY  02/26/2012   Procedure: ARTHROSCOPY KNEE;  Surgeon: Sydnee Cabal, MD;  Location: [facility];  Service: Orthopedics;  Laterality: Left;  medial menisectomy and chondraplasty       Home Medications    Prior to  Admission medications   Medication Sig Start Date End Date Taking? Authorizing Provider  apixaban (ELIQUIS) 2.5 MG TABS tablet Take 1 tablet (2.5 mg total) by mouth 2 (two) times daily. 10/12/17  Yes Rosalin Hawking, MD  aspirin EC 81 MG tablet Take 81 mg by mouth daily after supper.   Yes [provider]  Biotin 5000 MCG TABS Take 5,000 mcg by mouth daily.   Yes [provider]  budesonide-formoterol (SYMBICORT) 160-4.5 MCG/ACT inhaler Inhale 2 puffs into the lungs 2 (two) times daily. 08/02/17  Yes Tanda Rockers, MD  Choline Fenofibrate (TRILIPIX) 135 MG capsule Take 135 mg by mouth daily after supper.    Yes  [provider]  ezetimibe (ZETIA) 10 MG tablet Take 10 mg by mouth daily after supper.    Yes [provider]  Febuxostat (ULORIC) 80 MG TABS Take 80 mg by mouth daily after supper.   Yes [provider]  furosemide (LASIX) 20 MG tablet Take 1 tablet (20 mg total) by mouth every other day. 09/23/17 12/22/17 Yes Meng, Isaac Laud, PA  GLUCOSAMINE-CHONDROITIN PO Take 1 tablet by mouth 2 (two) times daily. 1500-1200   Yes [provider]  levothyroxine (SYNTHROID, LEVOTHROID) 88 MCG tablet Take 88 mcg by mouth daily before breakfast.   Yes [provider]  Magnesium 250 MG TABS Take 250 mg by mouth daily.   Yes [provider]  Multiple Vitamin (MULTIVITAMIN WITH MINERALS) TABS tablet Take 1 tablet by mouth daily.   Yes [provider]  Omega-3 Fatty Acids (FISH OIL) 1200 MG CAPS Take 2,400 mg by mouth 2 (two) times daily.   Yes [provider]  OXYGEN Inhale 2 L into the lungs as needed (for  shortness of breath with exertion only).    Yes [provider]  pregabalin (LYRICA) 50 MG capsule Take 50 mg by mouth 2 (two) times daily.   Yes [provider]  rosuvastatin (CRESTOR) 20 MG tablet Take 20 mg by mouth daily after supper.    Yes [provider]  terazosin (HYTRIN) 5 MG capsule Take 10 mg by mouth 2 (two) times daily. 08/30/17  Yes [provider]  tiotropium (SPIRIVA) 18 MCG inhalation capsule Take 18 mcg by mouth daily.   Yes [provider]  Turmeric 500 MG CAPS Take 500 mg by mouth daily.   Yes [provider]  vitamin B-12 (CYANOCOBALAMIN) 1000 MCG tablet Take 1,000 mcg by mouth daily.   Yes [provider]    Family History Family History  Problem Relation Age of Onset  . Diabetes Mother   . Heart disease Mother        Before age 5  . Diabetes Sister   . Hypertension Sister   . Diabetes Brother   . Hypertension Brother   . Hyperlipidemia Brother   .  Hypertension Son   . Heart attack Daughter     Social History Social History  Substance Use Topics  . Smoking status: Former Smoker    Packs/day: 0.75    Years: 51.00    Types: Cigarettes    Quit date: 02/23/2000  . Smokeless tobacco: Never Used  . Alcohol use 0.0 - 0.6 oz/week     Comment: Moderate to little     Allergies   Other; Sulfa antibiotics; Latex; and Tape   Review of Systems Review of Systems  Neurological: Positive for weakness, light-headedness and numbness. Negative for headaches.  All other systems reviewed and are negative.  Physical Exam Updated Vital Signs BP (!) 157/67   Pulse (!) 43   Temp 97.6 F (36.4 C)   Resp 14   Ht 5\' 9"  (1.753 m)   Wt 96.6 kg (212 lb 15.4 oz)   SpO2 97%   BMI 31.45 kg/m   Physical Exam  Constitutional: He is oriented to person, place, and time. He appears well-developed and well-nourished. No distress.  HENT:  Head: Normocephalic and atraumatic.  Cardiovascular: Normal rate, regular rhythm and normal heart sounds.   No murmur heard. Pulmonary/Chest: Effort normal and breath sounds normal. No respiratory distress.  Abdominal: Soft. He exhibits no distension. There is no tenderness.  Musculoskeletal:  Left upper and lower extremity weakness when compared to right.   Neurological: He is alert and oriented to person, place, and time.  Alert, oriented, thought content appropriate, able to give a history of today's events. Speech is clear and goal oriented, able to follow commands.  Cranial Nerves:  II:  Peripheral visual fields grossly normal, pupils equal, round, reactive to light III, IV, VI: EOM intact bilaterally, ptosis not present V,VII: smile with slight right facial weakness VIII: hearing grossly normal IX, X: symmetric soft palate movement, uvula elevates symmetrically  XI: bilateral shoulder shrug symmetric and strong XII: midline tongue extension Decreased sensation to light touch of left upper and lower  extremities when compared to right.   Skin: Skin is warm and dry.  Nursing note and vitals reviewed.    ED Treatments / Results  Labs (all labs ordered are listed, but only abnormal results are displayed) Labs Reviewed  COMPREHENSIVE METABOLIC PANEL - Abnormal; Notable for the following:       Result Value   BUN 50 (*)    Creatinine, Ser 2.52 (*)    AST 46 (*)    GFR calc non Af Amer 23 (*)    GFR calc Af Amer 26 (*)    All other components within normal limits  I-STAT CHEM 8, ED - Abnormal; Notable for the following:    BUN 47 (*)    Creatinine, Ser 2.50 (*)    All other components within normal limits  PROTIME-INR  APTT  CBC  DIFFERENTIAL  I-STAT TROPONIN, ED  CBG MONITORING, ED    EKG  EKG Interpretation  Date/Time:  Sunday October 03 2017 11:22:40 EDT Ventricular Rate:  42 PR Interval:    QRS Duration: 102 QT Interval:  501 QTC Calculation: 419 R Axis:   77 Text Interpretation:  Junctional rhythm Sinus pause Confirmed by Isla Pence [phone]) on 10/03/2017 11:25:53 AM       Radiology Ct Head Code Stroke Wo Contrast  Result Date: 10/03/2017 CLINICAL DATA:  Code stroke. Right facial droop. Left lower extremity drift. EXAM: CT HEAD WITHOUT CONTRAST TECHNIQUE: Contiguous axial images were obtained from the base of the skull through the vertex without intravenous contrast. COMPARISON:  Brain MRI 09/12/2017 FINDINGS: Brain: No evidence of acute infarction, hemorrhage, hydrocephalus, extra-axial collection or mass lesion/mass effect. Vascular: No hyperdense vessel. Atherosclerotic calcification. Tortuosity of the vertebrobasilar system which is strongly dominant to the right Skull: No acute finding Sinuses/Orbits: No acute finding.  Bilateral cataract resection. Other: Text page with prelim sent on 10/03/2017 at 11:19 am to Dr. Orlena Sheldon ASPECTS [facility] Stroke Program Early CT Score) - Ganglionic level infarction (caudate, lentiform nuclei, internal capsule, insula,  M1-M3 cortex): 7 - Supraganglionic infarction (M4-M6 cortex): 3 Total score (0-10 with 10 being normal): 10 IMPRESSION: No acute finding or change from  09/11/2017. Electronically Signed   By: Monte Fantasia M.D.   On: 10/03/2017 11:21    Procedures Procedures (including critical care time)  Medications Ordered in ED Medications - No data to display   Initial Impression / Assessment and Plan / ED Course  I have reviewed the triage vital signs and the nursing notes.  Pertinent labs & imaging results that were available during my care of the patient were reviewed by me and considered in my medical decision making (see chart for details).    Derek [name] is a 82 y.o. male who presents to ED as code stroke for left-sided weakness prior to arrival. Neurology evaluated patient upon ED arrival. CT without acute findings. Patient endorses tingling to left upper extremity still, but otherwise feels improved. Neurology recommends MRI and hospitalist admission. Patient has history of bradycardia followed by cards. Per chart review, HR during last admission averages 35-50. EKG today with junctional bradycardia. HR in 40's. Cardiology, Dr. Meda Coffee, consulted: cardiology will evaluate patient as well. Hospitalist consulted who will admit.   Patient seen by and discussed with Dr. Gilford Raid who agrees with treatment plan.   Final Clinical Impressions(s) / ED Diagnoses   Final diagnoses:  Stroke-like symptoms    New Prescriptions New Prescriptions   No medications on file     Lakeyia Surber, Ozella Almond, PA-C 10/03/17 1449    Isla Pence, MD 10/03/17 [phone]

## 2017-10-03 NOTE — ED Triage Notes (Signed)
Received pt from home via EMS with c/o last seen well at 0900 by son. Son went to the store and was gone about 30 mins, when he returned he found his father nodding in a chair reporting that he did not feel well. Son placed pt on his home O2, pt reported to son that he felt tingling on left side. Pt noted to have right sided facial droop.

## 2017-10-03 NOTE — Consult Note (Signed)
Cardiology Consultation:   Patient ID: Derek [name]; [id number]; Mar 28, 1935   Admit date: 10/03/2017 Date of Consult: 10/03/2017  Primary Care Provider: Merrilee Seashore, MD Primary Cardiologist: Dr. Daneen Schick  Primary Electrophysiologist:  Dr. Virl Axe   Patient Profile:   Derek [name] is a 82 y.o. male with a hx of CAD status post CABG in 0354, diastolic heart failure, abdominal aortic aneurysm, hypertension, COPD, sleep apnea, prior stroke, atrial fibrillation with slow ventricular response who is being seen today for the evaluation of bradycardia at the request of Dr. Daleen Bo.  History of Present Illness:   Derek [name] was admitted in 6/56 with embolic CVA treated with tPA he was noted to be in atrial fibrillation at that time with slow ventricular rate.  He was seen by Dr. Caryl Comes who felt that pacemaker would be needed but the patient would need an adequate period of anticoagulation before proceeding with device implantation.  He was last seen in the office 09/23/17.  He has follow-up in the office 10/07/17 with our EP service.  He presented to the emergency room today via EMS with acute right-sided weakness.  He was seen by neurology as part of the code stroke protocol.  CT was negative for acute changes.  It is felt that he has had a possible brainstem TIA or small stroke.  IV tPA was not recommended.  It was felt that bradycardia may be causing some decreased perfusion leading to neurologic symptoms and cardiology has been asked to evaluate for possible pacemaker.   The patient is currently in the MRI scanner.  I spoke with his son and significant other.  Derek [name] has been fatigued and has poor exercise tolerance.  He has been short of breath with minimal activity. He has not complained of chest pain.  He has not passed out. His lower extremity edema has improved with increasing dose of Lasix and his weight is down approximately 15 lbs.    Past Medical History:  Diagnosis  Date  . AAA (abdominal aortic aneurysm) ([place])   . Arthritis   . COPD (chronic obstructive pulmonary disease) ([place])   . Coronary artery disease   . Hypertension   . Hypothyroidism   . Myocardial infarction ([place])   . Shortness of breath    on exertion;  has productive cough at times  . Sleep apnea    states mild . no cpap    Past Surgical History:  Procedure Laterality Date  . APPENDECTOMY  1963  . CATARACT EXTRACTION     w/ lid rise  . CORONARY ARTERY BYPASS GRAFT  2007  . EYE SURGERY Right March 2016   Cataract  . EYE SURGERY Bilateral May 2016   Eyebrow Lift  . EYE SURGERY Left June 2016   Cataract  . IR ANGIO INTRA EXTRACRAN SEL COM CAROTID INNOMINATE BILAT MOD SED  09/12/2017  . IR ANGIO VERTEBRAL SEL SUBCLAVIAN INNOMINATE UNI R MOD SED  09/12/2017  . KNEE ARTHROSCOPY  02/26/2012   Procedure: ARTHROSCOPY KNEE;  Surgeon: Sydnee Cabal, MD;  Location: [facility];  Service: Orthopedics;  Laterality: Left;  medial menisectomy and chondraplasty     Home Medications:  Prior to Admission medications   Medication Sig Start Date End Date Taking? Authorizing Provider  apixaban (ELIQUIS) 2.5 MG TABS tablet Take 1 tablet (2.5 mg total) by mouth 2 (two) times daily. 10/12/17  Yes Rosalin Hawking, MD  aspirin EC 81 MG tablet Take 81 mg by mouth daily  after supper.   Yes [provider]  Biotin 5000 MCG TABS Take 5,000 mcg by mouth daily.   Yes [provider]  budesonide-formoterol (SYMBICORT) 160-4.5 MCG/ACT inhaler Inhale 2 puffs into the lungs 2 (two) times daily. 08/02/17  Yes Tanda Rockers, MD  Choline Fenofibrate (TRILIPIX) 135 MG capsule Take 135 mg by mouth daily after supper.    Yes [provider]  ezetimibe (ZETIA) 10 MG tablet Take 10 mg by mouth daily after supper.    Yes [provider]  Febuxostat (ULORIC) 80 MG TABS Take 80 mg by mouth daily after supper.   Yes [provider]  furosemide (LASIX) 20 MG tablet Take 1  tablet (20 mg total) by mouth every other day. 09/23/17 12/22/17 Yes Meng, Isaac Laud, PA  GLUCOSAMINE-CHONDROITIN PO Take 1 tablet by mouth 2 (two) times daily. 1500-1200   Yes [provider]  levothyroxine (SYNTHROID, LEVOTHROID) 88 MCG tablet Take 88 mcg by mouth daily before breakfast.   Yes [provider]  Magnesium 250 MG TABS Take 250 mg by mouth daily.   Yes [provider]  Multiple Vitamin (MULTIVITAMIN WITH MINERALS) TABS tablet Take 1 tablet by mouth daily.   Yes [provider]  Omega-3 Fatty Acids (FISH OIL) 1200 MG CAPS Take 2,400 mg by mouth 2 (two) times daily.   Yes [provider]  OXYGEN Inhale 2 L into the lungs as needed (for  shortness of breath with exertion only).    Yes [provider]  pregabalin (LYRICA) 50 MG capsule Take 50 mg by mouth 2 (two) times daily.   Yes [provider]  rosuvastatin (CRESTOR) 20 MG tablet Take 20 mg by mouth daily after supper.    Yes [provider]  terazosin (HYTRIN) 5 MG capsule Take 10 mg by mouth 2 (two) times daily. 08/30/17  Yes [provider]  tiotropium (SPIRIVA) 18 MCG inhalation capsule Take 18 mcg by mouth daily.   Yes [provider]  Turmeric 500 MG CAPS Take 500 mg by mouth daily.   Yes [provider]  vitamin B-12 (CYANOCOBALAMIN) 1000 MCG tablet Take 1,000 mcg by mouth daily.   Yes [provider]    Inpatient Medications: Scheduled Meds: .  stroke: mapping our early stages of recovery book   Does not apply Once  . apixaban  2.5 mg Oral BID  . aspirin EC  81 mg Oral QPC supper  . Biotin  5,000 mcg Oral Daily  . ezetimibe  10 mg Oral QPC supper  . Febuxostat  80 mg Oral QPC supper  . [START ON 10/04/2017] levothyroxine  88 mcg Oral QAC breakfast  . Magnesium  250 mg Oral Daily  . mometasone-formoterol  2 puff Inhalation BID  . multivitamin with minerals  1 tablet Oral Daily  . pregabalin  50 mg Oral BID  .  rosuvastatin  20 mg Oral QPC supper  . tiotropium  18 mcg Inhalation Daily  . Turmeric  500 mg Oral Daily  . vitamin B-12  1,000 mcg Oral Daily   Continuous Infusions: . sodium chloride     PRN Meds: [DISCONTINUED] acetaminophen **OR** [DISCONTINUED] acetaminophen (TYLENOL) oral liquid 160 mg/5 mL **OR** acetaminophen, hydrALAZINE  Allergies:    Allergies  Allergen Reactions  . Other Hives and Dermatitis    -mycins  . Sulfa Antibiotics Rash  . Latex Rash  . Tape Rash    Reaction to adhesive tape - pls use paper tape  Social History:   Social History   Social History  . Marital status: Divorced    Spouse name: N/A  . Number of children: N/A  . Years of education: 12+   Occupational History  . Retired     Social History Main Topics  . Smoking status: Former Smoker    Packs/day: 0.75    Years: 51.00    Types: Cigarettes    Quit date: 02/23/2000  . Smokeless tobacco: Never Used  . Alcohol use 0.0 - 0.6 oz/week     Comment: Moderate to little  . Drug use: No  . Sexual activity: Not on file   Other Topics Concern  . Not on file   Social History Narrative   Patient lives at home alone    Patient is retired    Patient has 3 years of college        Family History:    Family History  Problem Relation Age of Onset  . Diabetes Mother   . Heart disease Mother        Before age 36  . Diabetes Sister   . Hypertension Sister   . Diabetes Brother   . Hypertension Brother   . Hyperlipidemia Brother   . Hypertension Son   . Heart attack Daughter      ROS:  Please see the history of present illness.  ROS  All other ROS reviewed and negative.     Physical Exam/Data:   Vitals:   10/03/17 1315 10/03/17 1330 10/03/17 1400 10/03/17 1414  BP: (!) 158/61 (!) 146/68 (!) 157/67   Pulse: (!) 48 (!) 42 (!) 43   Resp: _0 Temp:    97.6 F (36.4 C)  TempSrc:      SpO2: (!) 89% 94% 97%   Weight:      Height:       No intake or output data in the 24 hours  ending 10/03/17 1455 Filed Weights   10/03/17 1144  Weight: 212 lb 15.4 oz (96.6 kg)   Body mass index is 31.45 kg/m.  General:  Well nourished, well developed, in no acute distress HEENT: normal Lymph: no adenopathy Neck: no JVD Endocrine:  No thryomegaly Vascular: No carotid bruits; FA pulses 2+ bilaterally without bruits  Cardiac:  normal S1, S2; RRR; no murmur  Lungs:  clear to auscultation bilaterally, no wheezing, rhonchi or rales  Abd: soft, nontender, no hepatomegaly  Ext: no edema Musculoskeletal:  No deformities, BUE and BLE strength normal and equal Skin: warm and dry  Neuro:  CNs 2-12 intact, no focal abnormalities noted Psych:  Normal affect   EKG:  The EKG was personally reviewed and demonstrates: Atrial fibrillation, heart rate 42   Relevant CV Studies:  Neck CTA 09/11/17 IMPRESSION: 70% diameter stenosis right internal carotid artery due to heavily calcified plaque. Moderate stenosis right cavernous carotid. 50% diameter stenosis left internal carotid artery due to heavily calcified plaque. Moderate stenosis left cavernous carotid  Echocardiogram 09/13/17 Mild LVH, EF 55, normal wall motion, indeterminate diastolic function, mildly dilated aortic root (38 mm), mild MAC, trivial MR, moderate BAE, PASP 34  Cardiac catheterization 10/2006 Severe two-vessel CAD  >>  coronary artery bypass grafting   Laboratory Data:  Chemistry Recent Labs Lab 10/03/17 1153 10/03/17 1206  NA 139 142  K 4.4 4.3  CL 106 105  CO2 26  --   GLUCOSE 96 92  BUN 50* 47*  CREATININE 2.52* 2.50*  CALCIUM 9.6  --  GFRNONAA 23*  --   GFRAA 26*  --   ANIONGAP 7  --      Recent Labs Lab 10/03/17 1153  PROT 6.9  ALBUMIN 3.8  AST 46*  ALT 28  ALKPHOS 40  BILITOT 1.1   Hematology Recent Labs Lab 10/03/17 1153 10/03/17 1206  WBC 5.6  --   RBC 4.54  --   HGB 13.9 14.3  HCT 43.0 42.0  MCV 94.7  --   MCH 30.6  --   MCHC 32.3  --   RDW 14.4  --   PLT 173  --     Cardiac EnzymesNo results for input(s): TROPONINI in the last 168 hours.  Recent Labs Lab 10/03/17 1205  TROPIPOC 0.01    BNPNo results for input(s): BNP, PROBNP in the last 168 hours.  DDimer No results for input(s): DDIMER in the last 168 hours.  Radiology/Studies:  Ct Head Code Stroke Wo Contrast  Result Date: 10/03/2017 CLINICAL DATA:  Code stroke. Right facial droop. Left lower extremity drift. EXAM: CT HEAD WITHOUT CONTRAST TECHNIQUE: Contiguous axial images were obtained from the base of the skull through the vertex without intravenous contrast. COMPARISON:  Brain MRI 09/12/2017 FINDINGS: Brain: No evidence of acute infarction, hemorrhage, hydrocephalus, extra-axial collection or mass lesion/mass effect. Vascular: No hyperdense vessel. Atherosclerotic calcification. Tortuosity of the vertebrobasilar system which is strongly dominant to the right Skull: No acute finding Sinuses/Orbits: No acute finding.  Bilateral cataract resection. Other: Text page with prelim sent on 10/03/2017 at 11:19 am to Dr. Orlena Sheldon ASPECTS [facility] Stroke Program Early CT Score) - Ganglionic level infarction (caudate, lentiform nuclei, internal capsule, insula, M1-M3 cortex): 7 - Supraganglionic infarction (M4-M6 cortex): 3 Total score (0-10 with 10 being normal): 10 IMPRESSION: No acute finding or change from 09/11/2017. Electronically Signed   By: Monte Fantasia M.D.   On: 10/03/2017 11:21    Assessment and Plan:   1.  Atrial fibrillation with slow ventricular response CHADS2-VASc=7 (CVA, CHF, age, HTN, CAD).  Hemodynamically stable.  Keep external pacer pads available if needed.  EP will need to see the patient tomorrow to arrange pacemaker implantation.  Continue Apixaban.   2.  Coronary artery disease status post CABG No angina.  Continue statin.  3.  Transient ischemic attack Per neurology and primary service  4.  Chronic diastolic heart failure  5.  Hypertension BP above target.  Continue  Hydralazine as needed.  6.  Acute kidney injury on chronic kidney disease Diuretics had recently been adjusted in the office.  Creatinine had increased to 2.17.  Creatinine here today is 2.52.  Hold Lasix and monitor renal function.     For questions or updates, please contact [place] Please consult [email or web address] for contact info under Cardiology/STEMI.   Signed, Richardson Dopp, PA-C  10/03/2017 2:55 PM   The patient was seen, examined and discussed with Richardson Dopp, PA-C and I agree with the above.   82 y.o. male with a hx of CAD status post CABG in 6761, diastolic heart failure, abdominal aortic aneurysm, hypertension, COPD, sleep apnea, prior stroke, atrial fibrillation with slow ventricular response who is being seen today for the evaluation of bradycardia at the request of Dr. Daleen Bo. Admitted on 9/50 with embolic CVA treated with tPA he was noted to be in atrial fibrillation at that time with slow ventricular rate.  He was seen by Dr. Caryl Comes who felt that pacemaker would be needed but the patient would need an adequate period of anticoagulation before  proceeding with device implantation.  He was last seen in the office 09/23/17 and was scheduled for a follow up on 10/18.    He presented to the emergency room today via EMS with acute left-sided weakness.  He was seen by neurology as part of the code stroke protocol.  CT was negative for acute changes, but brain MRI showed a small acute lacunar infarct in the lateral right thalamus. No associated hemorrhage or mass effect.  The patient remains in atrial fibrillation with slow ventricular response with rates in low 40'. He complains of dizziness at home but no falls. He hasn't taken his Eliquis today yet as he has been in the ER. I will page neurology to see if ok to restart Eliquis in the settings of an acute stroke.  EP was contacted to follow him in the morning. He can eat breakfast as the schedule is busy in the morning, however EP  might be hesitant to place a pacer in a patient with new stroke.   He has no chest pain and no signs of CHF.   Ena Dawley, MD 10/03/2017

## 2017-10-03 NOTE — ED Notes (Signed)
Pt CBG was 92

## 2017-10-03 NOTE — Code Documentation (Signed)
82 y.o. Male with PMHx of CVA, sleep apnea, MI, HTN, CAD, COPD and AAA who was reported to have had an acute onset of left-sided numbness and tingling at 0900 this morning. EMS was notified and appreciated a right-sided facial droop and right leg weakness. Code stroke was called and patient brought to [facility] ED. On arrival, patient with HR in the 30's, mild right lower facial asymmetry, RLE drift, LUE ataxia and a decreased left-sided sensation. NIHSS 4. VAN (-). See EMR for NIHSS and code stroke times. CT with no acute findings. ASPECTS 10. IV tPA not given d/t being on apixaban. ED bedside handoff with ED RN Burna Mortimer. ED RN

## 2017-10-03 NOTE — Progress Notes (Signed)
Pt admitted to [place]. Welcomed and oriented to unit. No c/o pain or distress at this time. Will continue to monitor.

## 2017-10-03 NOTE — Consult Note (Signed)
ELECTROPHYSIOLOGY CONSULT NOTE    Patient ID: Derek [name] MRN: [id number], DOB/AGE: January 06, 1935 82 y.o.  Admit date: 10/03/2017 Date of Consult: 10/04/2017  Primary Physician: Merrilee Seashore, MD Primary Cardiologist: Tamala Julian Electrophysiologist: Caryl Comes  Patient Profile: Derek [name] is a 82 y.o. male with a history of HTN, AAA, COPD, persistent AF, and prior stroke who is being seen today for the evaluation of bradycardia at the request of Meda Coffee.  HPI:  Derek [name] is a 82 y.o. male who was admitted yesterday with acute right sided weakness. He was found to have new small acute lacunar infarct. Telemetry demonstrated AF with rates in the 30-40's (chronic since last stroke).  In September, he was seen by Dr Caryl Comes who recommended pacemaker implantation for bradycardia but wanted to wait until after acute stroke resolved.  He had follow up scheduled in the next few weeks.  He has been followed closely by Almyra Deforest, PA in the office.  He has had volume overload felt to be related to bradycardia.    Echo 09/13/17 demonstrated EF 55%, mild LVH, no RWMA, LA 54.   He denies chest pain, palpitations, dyspnea, PND, orthopnea, nausea, vomiting, dizziness, syncope, edema, weight gain, or early satiety.  Past Medical History:  Diagnosis Date  . AAA (abdominal aortic aneurysm) ([place])   . Arthritis   . COPD (chronic obstructive pulmonary disease) ([place])   . Coronary artery disease   . Hypertension   . Hypothyroidism   . Myocardial infarction ([place])   . Shortness of breath    on exertion;  has productive cough at times  . Sleep apnea    states mild . no cpap     Surgical History:  Past Surgical History:  Procedure Laterality Date  . APPENDECTOMY  1963  . CATARACT EXTRACTION     w/ lid rise  . CORONARY ARTERY BYPASS GRAFT  2007  . EYE SURGERY Right March 2016   Cataract  . EYE SURGERY Bilateral May 2016   Eyebrow Lift  . EYE SURGERY Left June 2016   Cataract  . IR ANGIO INTRA  EXTRACRAN SEL COM CAROTID INNOMINATE BILAT MOD SED  09/12/2017  . IR ANGIO VERTEBRAL SEL SUBCLAVIAN INNOMINATE UNI R MOD SED  09/12/2017  . KNEE ARTHROSCOPY  02/26/2012   Procedure: ARTHROSCOPY KNEE;  Surgeon: Sydnee Cabal, MD;  Location: [facility];  Service: Orthopedics;  Laterality: Left;  medial menisectomy and chondraplasty     Prescriptions Prior to Admission  Medication Sig Dispense Refill Last Dose  . [START ON 10/12/2017] apixaban (ELIQUIS) 2.5 MG TABS tablet Take 1 tablet (2.5 mg total) by mouth 2 (two) times daily. 180 tablet 3 10/02/2017 at 1900  . aspirin EC 81 MG tablet Take 81 mg by mouth daily after supper.   10/02/2017 at Unknown time  . Biotin 5000 MCG TABS Take 5,000 mcg by mouth daily.   10/02/2017 at Unknown time  . budesonide-formoterol (SYMBICORT) 160-4.5 MCG/ACT inhaler Inhale 2 puffs into the lungs 2 (two) times daily. 3 Inhaler 3 10/02/2017 at Unknown time  . Choline Fenofibrate (TRILIPIX) 135 MG capsule Take 135 mg by mouth daily after supper.    10/02/2017 at Unknown time  . ezetimibe (ZETIA) 10 MG tablet Take 10 mg by mouth daily after supper.    10/02/2017 at Unknown time  . Febuxostat (ULORIC) 80 MG TABS Take 80 mg by mouth daily after supper.   10/02/2017 at Unknown time  . furosemide (LASIX) 20 MG tablet Take  1 tablet (20 mg total) by mouth every other day.   10/02/2017 at Unknown time  . GLUCOSAMINE-CHONDROITIN PO Take 1 tablet by mouth 2 (two) times daily. 1500-1200   10/02/2017 at Unknown time  . levothyroxine (SYNTHROID, LEVOTHROID) 88 MCG tablet Take 88 mcg by mouth daily before breakfast.   10/03/2017 at Unknown time  . Magnesium 250 MG TABS Take 250 mg by mouth daily.   10/02/2017 at Unknown time  . Multiple Vitamin (MULTIVITAMIN WITH MINERALS) TABS tablet Take 1 tablet by mouth daily.   10/02/2017 at Unknown time  . Omega-3 Fatty Acids (FISH OIL) 1200 MG CAPS Take 2,400 mg by mouth 2 (two) times daily.   10/02/2017 at Unknown time  . OXYGEN  Inhale 2 L into the lungs as needed (for  shortness of breath with exertion only).    10/02/2017 at Unknown time  . pregabalin (LYRICA) 50 MG capsule Take 50 mg by mouth 2 (two) times daily.   10/02/2017 at Unknown time  . rosuvastatin (CRESTOR) 20 MG tablet Take 20 mg by mouth daily after supper.    10/02/2017 at Unknown time  . terazosin (HYTRIN) 5 MG capsule Take 10 mg by mouth 2 (two) times daily.   10/02/2017 at Unknown time  . tiotropium (SPIRIVA) 18 MCG inhalation capsule Take 18 mcg by mouth daily.   10/02/2017 at Unknown time  . Turmeric 500 MG CAPS Take 500 mg by mouth daily.   10/02/2017 at Unknown time  . vitamin B-12 (CYANOCOBALAMIN) 1000 MCG tablet Take 1,000 mcg by mouth daily.   10/02/2017 at Unknown time    Inpatient Medications:  . apixaban  2.5 mg Oral BID  . aspirin EC  81 mg Oral QPC supper  . ezetimibe  10 mg Oral QPC supper  . febuxostat  40 mg Oral QPM  . hydrocortisone cream   Topical TID  . levothyroxine  88 mcg Oral QAC breakfast  . magnesium gluconate  250 mg Oral Daily  . mometasone-formoterol  2 puff Inhalation BID  . multivitamin with minerals  1 tablet Oral Daily  . pregabalin  50 mg Oral BID  . rosuvastatin  20 mg Oral QPC supper  . tiotropium  18 mcg Inhalation Daily  . vitamin B-12  1,000 mcg Oral Daily    Allergies:  Allergies  Allergen Reactions  . Other Hives and Dermatitis    -mycins  . Sulfa Antibiotics Rash  . Latex Rash  . Tape Rash    Reaction to adhesive tape - pls use paper tape    Social History   Social History  . Marital status: Divorced    Spouse name: N/A  . Number of children: N/A  . Years of education: 12+   Occupational History  . Retired     Social History Main Topics  . Smoking status: Former Smoker    Packs/day: 0.75    Years: 51.00    Types: Cigarettes    Quit date: 02/23/2000  . Smokeless tobacco: Never Used  . Alcohol use 0.0 - 0.6 oz/week     Comment: Moderate to little  . Drug use: No  . Sexual  activity: Not on file   Other Topics Concern  . Not on file   Social History Narrative   Patient lives at home alone    Patient is retired    Patient has 3 years of college         Family History  Problem Relation Age of Onset  . Diabetes  Mother   . Heart disease Mother        Before age 60  . Diabetes Sister   . Hypertension Sister   . Diabetes Brother   . Hypertension Brother   . Hyperlipidemia Brother   . Hypertension Son   . Heart attack Daughter      Review of Systems: All other systems reviewed and are otherwise negative except as noted above.  Physical Exam: Vitals:   10/03/17 2108 10/03/17 2200 10/04/17 0114 10/04/17 0618  BP:  (!) 122/44 136/70 126/79  Pulse:  (!) 41 (!) 45 (!) 55  Resp:  12 16 18   Temp:  98.2 F (36.8 C) 98.2 F (36.8 C) 98.3 F (36.8 C)  TempSrc:  Oral Oral Oral  SpO2: 93% 96% 97% 95%  Weight:      Height:        GEN- The patient is elderly appearing, alert and oriented x 3 today.   HEENT: normocephalic, atraumatic; sclera clear, conjunctiva pink; hearing intact; oropharynx clear; neck supple Lungs- Clear to ausculation bilaterally, normal work of breathing.  No wheezes, rales, rhonchi Heart- bradycardic irregular rate and rhythm GI- soft, non-tender, non-distended, bowel sounds present Extremities- no clubbing, cyanosis, or edema MS- no significant deformity or atrophy Skin- warm and dry, no rash or lesion Psych- euthymic mood, full affect Neuro- strength and sensation are intact  Labs:   Lab Results  Component Value Date   WBC 5.6 10/03/2017   HGB 14.3 10/03/2017   HCT 42.0 10/03/2017   MCV 94.7 10/03/2017   PLT 173 10/03/2017     Recent Labs Lab 10/03/17 1153 10/03/17 1206  NA 139 142  K 4.4 4.3  CL 106 105  CO2 26  --   BUN 50* 47*  CREATININE 2.52* 2.50*  CALCIUM 9.6  --   PROT 6.9  --   BILITOT 1.1  --   ALKPHOS 40  --   ALT 28  --   AST 46*  --   GLUCOSE 96 92      Radiology/Studies: Mr Brain Wo  Contrast Result Date: 10/03/2017 CLINICAL DATA:  82 year old male who woke this morning with left side weakness and numbness. EXAM: MRI HEAD WITHOUT CONTRAST TECHNIQUE: Multiplanar, multiecho pulse sequences of the brain and surrounding structures were obtained without intravenous contrast. COMPARISON:  Head CT without contrast 1107 hours today. Brain MRI 09/12/2017, and earlier. FINDINGS: Brain: Small linear 9 mm focus of abnormal diffusion along the dorsal lateral right thalamus does appear restricted on ADC (series 3, image 27). Faint T2 and FLAIR hyperintensity. No hemorrhage or mass effect. No other convincing restricted diffusion. No midline shift, mass effect, evidence of mass lesion, ventriculomegaly, extra-axial collection or acute intracranial hemorrhage. Cervicomedullary junction and pituitary are within normal limits. Stable and largely normal for age gray and white matter signal elsewhere. Vascular: Major intracranial vascular flow voids are stable since September. Moderate generalized intracranial artery dolichoectasia. Skull and upper cervical spine: Negative. Normal bone marrow signal. Sinuses/Orbits: Stable and negative. Other: Mastoid air cells are well pneumatized and stable. Visible internal auditory structures appear normal. Scalp and face soft tissues appear negative. IMPRESSION: 1. Small acute lacunar infarct in the lateral right thalamus. No associated hemorrhage or mass effect. 2. Otherwise stable noncontrast MRI appearance of the brain, notable for chronic intracranial artery dolichoectasia. Electronically Signed   By: Genevie Ann M.D.   On: 10/03/2017 15:59   EKG: AF, V rate 42 (personally reviewed)  TELEMETRY: AF, V rates 30-40's (personally  reviewed)  Assessment/Plan: 1.  AF with slow ventricular response He is likely having symptomatic bradycardia with volume overload. Neurology feels that TIA's may also be caused by bradycardia. Risks, benefits to pacemaker implantation reviewed  with the patient who wishes to proceed. Will plan for later today. Will discuss with neurology to be sure there are no acute contraindications. Hold Eliquis this morning   CHADS2VASC is at least 5, continue Eliquis long term With severe LA enlargement, I am not optimistic about our ability to restore SR. If he is symptomatically improved post pacing, would likely pursue rate control strategy, although 1 attempt at [place] may not be unreasonable.  He is interested in [employer] study, research aware  2.  Acute lacunar CVA Per neurology  3.  Chronic diastolic heart failure Has diuresed well on increased dose of Lasix in outpatient setting, Lasix held here for AKI Follow post pacing  4.  AKI Follow BMET post pacing  His cr has been elevated > 10 yrs   Dr Caryl Comes to see later today   Signed, Chanetta Marshall 10/04/2017 7:42 AM  Issues as outlined last month,  Bradycardia, prob an issue with pacing ideally deferred 2/2 bleeding issues with apixoban and ASA Now with another neurological event. Neuro asking whehter could be caused by bradycardia; for reasons outline last month I think not, but this infarct--lacunar-- is unlikely assoc with cardioembolic cause and so risks of stopping anticoagulation less and now is prob as good a time as any  The benefits and risks were reviewed including but not limited to death,  perforation, infection, lead dislodgement and device malfunction.  The patient understands agrees and is willing to proceed.  PE and labs as above

## 2017-10-04 ENCOUNTER — Ambulatory Visit (HOSPITAL_COMMUNITY)
Admission: EM | Disposition: A | Discharge status: Home / Self Care | Payer: Self-pay | Source: Home / Self Care | Attending: Emergency Medicine

## 2017-10-04 DIAGNOSIS — Z959 Presence of cardiac and vascular implant and graft, unspecified: Secondary | ICD-10-CM

## 2017-10-04 DIAGNOSIS — R001 Bradycardia, unspecified: Secondary | ICD-10-CM | POA: Diagnosis not present

## 2017-10-04 DIAGNOSIS — I481 Persistent atrial fibrillation: Secondary | ICD-10-CM | POA: Diagnosis not present

## 2017-10-04 DIAGNOSIS — I6521 Occlusion and stenosis of right carotid artery: Secondary | ICD-10-CM | POA: Diagnosis not present

## 2017-10-04 DIAGNOSIS — I6381 Other cerebral infarction due to occlusion or stenosis of small artery: Secondary | ICD-10-CM | POA: Diagnosis not present

## 2017-10-04 DIAGNOSIS — I639 Cerebral infarction, unspecified: Secondary | ICD-10-CM | POA: Diagnosis not present

## 2017-10-04 DIAGNOSIS — I4891 Unspecified atrial fibrillation: Secondary | ICD-10-CM | POA: Diagnosis not present

## 2017-10-04 HISTORY — PX: PACEMAKER IMPLANT: EP1218

## 2017-10-04 SURGERY — PACEMAKER IMPLANT

## 2017-10-04 MED ORDER — ONDANSETRON HCL 4 MG/2ML IJ SOLN: 4.0000 mg | Freq: Four times a day (QID) | INTRAMUSCULAR | Status: DC | PRN

## 2017-10-04 MED ORDER — MIDAZOLAM HCL 5 MG/5ML IJ SOLN
INTRAMUSCULAR | Status: DC | PRN
  Administered 2017-10-04 (×3): 1 mg via INTRAVENOUS

## 2017-10-04 MED ORDER — CEFAZOLIN SODIUM-DEXTROSE 2-4 GM/100ML-% IV SOLN
2.0000 g | INTRAVENOUS | Status: AC
  Administered 2017-10-04: 2 g via INTRAVENOUS

## 2017-10-04 MED ORDER — HEPARIN (PORCINE) IN NACL 2-0.9 UNIT/ML-% IJ SOLN
INTRAMUSCULAR | Status: AC
  Filled 2017-10-04: qty 500

## 2017-10-04 MED ORDER — APIXABAN 2.5 MG PO TABS: 2.5000 mg | ORAL_TABLET | Freq: Two times a day (BID) | ORAL | 3 refills | Status: DC

## 2017-10-04 MED ORDER — CHLORHEXIDINE GLUCONATE 4 % EX LIQD: 60.0000 mL | Freq: Once | CUTANEOUS | Status: DC

## 2017-10-04 MED ORDER — FENTANYL CITRATE (PF) 100 MCG/2ML IJ SOLN
INTRAMUSCULAR | Status: DC | PRN
  Administered 2017-10-04 (×2): 25 ug via INTRAVENOUS
  Administered 2017-10-04: 12.5 ug via INTRAVENOUS

## 2017-10-04 MED ORDER — ACETAMINOPHEN 325 MG PO TABS: 325.0000 mg | ORAL_TABLET | ORAL | Status: DC | PRN

## 2017-10-04 MED ORDER — HEPARIN (PORCINE) IN NACL 2-0.9 UNIT/ML-% IJ SOLN
INTRAMUSCULAR | Status: AC | PRN
  Administered 2017-10-04: 500 mL

## 2017-10-04 MED ORDER — SODIUM CHLORIDE 0.9 % IV SOLN: INTRAVENOUS | Status: AC

## 2017-10-04 MED ORDER — SODIUM CHLORIDE 0.9 % IV SOLN: INTRAVENOUS | Status: DC

## 2017-10-04 MED ORDER — HYDROCORTISONE 1 % EX CREA: TOPICAL_CREAM | Freq: Three times a day (TID) | CUTANEOUS | 0 refills | Status: DC

## 2017-10-04 MED ORDER — CEFAZOLIN SODIUM-DEXTROSE 1-4 GM/50ML-% IV SOLN
1.0000 g | Freq: Four times a day (QID) | INTRAVENOUS | Status: DC
  Filled 2017-10-04 (×3): qty 50

## 2017-10-04 MED ORDER — FENTANYL CITRATE (PF) 100 MCG/2ML IJ SOLN
INTRAMUSCULAR | Status: AC
  Filled 2017-10-04: qty 2

## 2017-10-04 MED ORDER — SODIUM CHLORIDE 0.9 % IR SOLN
Status: AC
  Filled 2017-10-04: qty 2

## 2017-10-04 MED ORDER — CEFAZOLIN SODIUM-DEXTROSE 2-4 GM/100ML-% IV SOLN
INTRAVENOUS | Status: AC
  Filled 2017-10-04: qty 100

## 2017-10-04 MED ORDER — MIDAZOLAM HCL 5 MG/5ML IJ SOLN
INTRAMUSCULAR | Status: AC
  Filled 2017-10-04: qty 5

## 2017-10-04 MED ORDER — BUPIVACAINE HCL (PF) 0.25 % IJ SOLN
INTRAMUSCULAR | Status: DC | PRN
  Administered 2017-10-04: 45 mL

## 2017-10-04 MED ORDER — SODIUM CHLORIDE 0.9 % IV SOLN
INTRAVENOUS | Status: DC
  Administered 2017-10-04: 13:00:00 via INTRAVENOUS

## 2017-10-04 MED ORDER — SODIUM CHLORIDE 0.9 % IR SOLN
80.0000 mg | Status: AC
  Administered 2017-10-04: 80 mg

## 2017-10-04 MED ORDER — BUPIVACAINE HCL (PF) 0.25 % IJ SOLN
INTRAMUSCULAR | Status: AC
  Filled 2017-10-04: qty 60

## 2017-10-04 MED ORDER — APIXABAN 2.5 MG PO TABS: 2.5000 mg | ORAL_TABLET | Freq: Two times a day (BID) | ORAL | Status: DC

## 2017-10-04 SURGICAL SUPPLY — 9 items
CABLE SURGICAL S-101-97-12 (CABLE) ×2 IMPLANT
HEMOSTAT SURGICEL 2X4 FIBR (HEMOSTASIS) ×2 IMPLANT
IPG PACE AZUR XT DR MRI W1DR01 (Pacemaker) ×1 IMPLANT
LEAD CAPSURE NOVUS 5076-52CM (Lead) ×2 IMPLANT
LEAD CAPSURE NOVUS 5076-58CM (Lead) ×2 IMPLANT
PACE AZURE XT DR MRI W1DR01 (Pacemaker) ×2 IMPLANT
PAD DEFIB LIFELINK (PAD) ×2 IMPLANT
SHEATH CLASSIC 7F (SHEATH) ×4 IMPLANT
TRAY PACEMAKER INSERTION (PACKS) ×2 IMPLANT

## 2017-10-04 NOTE — Evaluation (Addendum)
Physical Therapy Evaluation Patient Details Name: Derek [name] MRN: [id number] DOB: February 06, 1935 Today's Date: 10/04/2017   History of Present Illness  82 y.o.malewith PMH of CAD h/o CABG, COPD, HTN, Hypothyroidism, Gout, Recent Dx of afib with embolic stroke, (2/69-4/85), bradycardiais presented with left sided numbness. Patient presents with left sided weakness and numbness. He also reports dizziness and left sided facial weakness. MRI revealed Small acute lacunar infarct in the lateral right thalamus. Plan for pacemaker impantation per MD.  Clinical Impression  Orders received for PT evaluation. Patient demonstrates deficits in functional mobility as indicated below. Will benefit from continued skilled PT to address deficits and maximize function. Will see as indicated and progress as tolerated.  At this time, patient reports resolution of motor and sensory changes. Patient mildly unsteady with gait but overall mobilizing well, will recommend HHPT upon acute discharge IF pacer implanted, otherwise recommend return to outpatient services.  Educated patient on precautions post pacer implantation.     Follow Up Recommendations Home health PT;Supervision for mobility/OOB (initially if pacer implanted)    Equipment Recommendations  None recommended by PT    Recommendations for Other Services       Precautions / Restrictions Precautions Precautions: Fall Precaution Comments: mildly unsteady on his feet Restrictions Weight Bearing Restrictions: No      Mobility  Bed Mobility               General bed mobility comments: sitting EOB upon arrival  Transfers Overall transfer level: Needs assistance Equipment used: None Transfers: Sit to/from Stand Sit to Stand: Supervision         General transfer comment: Supervision for safety, no physical assist required  Ambulation/Gait Ambulation/Gait assistance: Supervision Ambulation Distance (Feet): 410 Feet Assistive device:  None Gait Pattern/deviations: Step-through pattern;Decreased dorsiflexion - left;Decreased stance time - left;Staggering right;Staggering left   Gait velocity interpretation: at or above normal speed for age/gender General Gait Details: supervision for safety, modest instbaility but no overt LOB noted  Stairs            Wheelchair Mobility    Modified Rankin (Stroke Patients Only) Modified Rankin (Stroke Patients Only) Pre-Morbid Rankin Score: No symptoms Modified Rankin: Moderately severe disability     Balance Overall balance assessment: Needs assistance Sitting-balance support: Feet supported;No upper extremity supported Sitting balance-Leahy Scale: Good     Standing balance support: No upper extremity supported Standing balance-Leahy Scale: Good Standing balance comment: Balance deficits evident when challenged.              High level balance activites: Side stepping;Direction changes;Turns;Sudden stops;Head turns High Level Balance Comments: tolerated higher level balance tasks without difficulty             Pertinent Vitals/Pain Pain Assessment: No/denies pain    Home Living Family/patient expects to be discharged to:: Private residence Living Arrangements: Alone Available Help at Discharge: Other (Comment) Type of Home: Apartment Home Access: Level entry     Home Layout: One level Home Equipment: Cane - single point;Grab bars - tub/shower;Other (comment);Walker - 2 wheels Management consultant) Additional Comments: Kids/girlfriend can assist as needed    Prior Function Level of Independence: Independent (Drives; enjoys traveling)         Comments: Pt admitts to cruising on furniture at times for balance, but no recent h/o falls.  He also reports L leg sciatica and h/o vertigo     Hand Dominance   Dominant Hand: Right    Extremity/Trunk Assessment   Upper Extremity Assessment Upper  Extremity Assessment: Overall WFL for tasks assessed    Lower  Extremity Assessment Lower Extremity Assessment: Overall WFL for tasks assessed       Communication   Communication: No difficulties  Cognition Arousal/Alertness: Awake/alert Behavior During Therapy: WFL for tasks assessed/performed Overall Cognitive Status: History of cognitive impairments - at baseline                                 General Comments: memory deficits at baseline.        General Comments      Exercises     Assessment/Plan    PT Assessment Patient needs continued PT services  PT Problem List Decreased strength;Decreased activity tolerance;Decreased balance;Decreased mobility;Decreased knowledge of precautions;Cardiopulmonary status limiting activity;Decreased knowledge of use of DME       PT Treatment Interventions DME instruction;Gait training;Functional mobility training;Therapeutic activities;Therapeutic exercise;Balance training;Neuromuscular re-education;Patient/family education    PT Goals (Current goals can be found in the Care Plan section)  Acute Rehab PT Goals Patient Stated Goal: To go home and get back to driving/traveling PT Goal Formulation: With patient Time For Goal Achievement: 09/27/17 Potential to Achieve Goals: Good    Frequency Min 3X/week   Barriers to discharge        Co-evaluation               AM-PAC PT "6 Clicks" Daily Activity  Outcome Measure Difficulty turning over in bed (including adjusting bedclothes, sheets and blankets)?: None Difficulty moving from lying on back to sitting on the side of the bed? : None Difficulty sitting down on and standing up from a chair with arms (e.g., wheelchair, bedside commode, etc,.)?: None Help needed moving to and from a bed to chair (including a wheelchair)?: None Help needed walking in hospital room?: None Help needed climbing 3-5 steps with a railing? : A Little 6 Click Score: 23    End of Session Equipment Utilized During Treatment: Gait belt Activity  Tolerance: Patient tolerated treatment well Patient left: in chair;with call bell/phone within reach;with chair alarm set;with family/visitor present Nurse Communication: Mobility status PT Visit Diagnosis: Unsteadiness on feet (R26.81);Other symptoms and signs involving the nervous system (R29.898)    Time: 3149-7026 PT Time Calculation (min) (ACUTE ONLY): 21 min   Charges:   PT Evaluation $PT Eval Moderate Complexity: 1 Mod     PT G Codes:        Alben Deeds, PT DPT  Board Certified Neurologic Specialist [place] October 27, 2017, 8:31 AM     October 27, 2017 0800  PT G-Codes **NOT FOR INPATIENT CLASS**  Functional Assessment Tool Used Clinical judgement  Functional Limitation Mobility: Walking and moving around  Mobility: Walking and Moving Around Current Status [phone]) CJ  Mobility: Walking and Moving Around Goal Status [phone]) CI

## 2017-10-04 NOTE — Evaluation (Signed)
Occupational Therapy Evaluation Patient Details Name: Derek [name] MRN: [id number] DOB: 1935/03/09 Today's Date: 10/04/2017    History of Present Illness 82 y.o.malewith PMH of CAD h/o CABG, COPD, HTN, Hypothyroidism, Gout, Recent Dx of afib with embolic stroke, (6/29-4/76), bradycardiais presented with left sided numbness. Patient presents with left sided weakness and numbness. He also reports dizziness and left sided facial weakness. MRI revealed Small acute lacunar infarct in the lateral right thalamus. Plan for pacemaker impantation per MD.   Clinical Impression   PTA, pt was living alone and performed his ADLs and IADLs; pt's girlfriend/family able to provide supervision at dc. Pt performing ADLs and functional mobility at supervision-Min Guard level. Pt presenting with decreased balance and coordination of LUE. Pt would benefit from further acute OT to facilitate safe dc. Recommend dc home with 24 hour supervision/assist once medically stable per physician.     Follow Up Recommendations  No OT follow up;Supervision/Assistance - 24 hour    Equipment Recommendations  None recommended by OT    Recommendations for Other Services PT consult     Precautions / Restrictions Precautions Precautions: Fall Precaution Comments: mildly unsteady on his feet Restrictions Weight Bearing Restrictions: No      Mobility Bed Mobility               General bed mobility comments: in recliner upon arrival  Transfers Overall transfer level: Needs assistance Equipment used: None Transfers: Sit to/from Stand Sit to Stand: Supervision         General transfer comment: Supervision for safety, no physical assist required    Balance Overall balance assessment: Needs assistance Sitting-balance support: Feet supported;No upper extremity supported Sitting balance-Leahy Scale: Good     Standing balance support: No upper extremity supported Standing balance-Leahy Scale:  Good Standing balance comment: Balance deficits evident when challenged.              High level balance activites: Side stepping;Direction changes;Turns;Sudden stops;Head turns High Level Balance Comments: tolerated higher level balance tasks without difficulty           ADL either performed or assessed with clinical judgement   ADL Overall ADL's : Needs assistance/impaired Eating/Feeding: Modified independent;Sitting   Grooming: Supervision/safety;Oral care;Standing   Upper Body Bathing: Modified independent;Sitting   Lower Body Bathing: Set up;Supervison/ safety;Sit to/from stand   Upper Body Dressing : Modified independent;Sitting   Lower Body Dressing: Set up;Supervision/safety;Sit to/from stand Lower Body Dressing Details (indicate cue type and reason): Adjusted socks by bringing ankel to knee Toilet Transfer: Supervision/safety;Ambulation (Simualted to recliner)       Tub/ Shower Transfer: Min guard;Ambulation;Tub transfer Tub/Shower Transfer Details (indicate cue type and reason): Min guard for safety during tub transfer. Pt simulated home set up and performed transfer Functional mobility during ADLs: Min guard General ADL Comments: Pt with near baseline function. Presenting with decreased balance during dynamic tasks. discussed purchasing shower seat for LB bathing; pt states he will consider it. Educated pt on BEFAST for stroke signs and symptoms.      Vision Baseline Vision/History: Wears glasses Wears Glasses: Reading only Patient Visual Report: No change from baseline       Perception     Praxis      Pertinent Vitals/Pain Pain Assessment: No/denies pain     Hand Dominance Right   Extremity/Trunk Assessment Upper Extremity Assessment Upper Extremity Assessment: Overall WFL for tasks assessed;LUE deficits/detail LUE Deficits / Details: Decreased coordination for LUE.   Lower Extremity Assessment Lower Extremity Assessment: Overall  WFL for tasks  assessed       Communication Communication Communication: No difficulties   Cognition Arousal/Alertness: Awake/alert Behavior During Therapy: WFL for tasks assessed/performed Overall Cognitive Status: History of cognitive impairments - at baseline                                 General Comments: memory deficits at baseline.     General Comments       Exercises     Shoulder Instructions      Home Living Family/patient expects to be discharged to:: Private residence Living Arrangements: Alone Available Help at Discharge: Other (Comment) Type of Home: Apartment Home Access: Level entry     Home Layout: One level     Bathroom Shower/Tub: Tub/shower unit;Curtain   Bathroom Toilet: Handicapped height Bathroom Accessibility: No   Home Equipment: Cane - single point;Grab bars - tub/shower;Other (comment);Walker - 2 wheels Management consultant)   Additional Comments: Kids/girlfriend can assist as needed  Lives With: Alone    Prior Functioning/Environment Level of Independence: Independent (Drives; enjoys traveling)        Comments: ADls, IADLs, and driving. Pt admitts to cruising on furniture at times for balance, but no recent h/o falls.  He also reports L leg sciatica and h/o vertigo        OT Problem List: Decreased activity tolerance;Impaired balance (sitting and/or standing);Decreased safety awareness;Decreased knowledge of use of DME or AE      OT Treatment/Interventions: Self-care/ADL training;Therapeutic exercise;Energy conservation;DME and/or AE instruction;Therapeutic activities;Patient/family education    OT Goals(Current goals can be found in the care plan section) Acute Rehab OT Goals Patient Stated Goal: To go home and get back to driving/traveling OT Goal Formulation: With patient Time For Goal Achievement: 10/18/17 Potential to Achieve Goals: Good  OT Frequency: Min 2X/week   Barriers to D/C:            Co-evaluation               AM-PAC PT "6 Clicks" Daily Activity     Outcome Measure Help from another person eating meals?: None Help from another person taking care of personal grooming?: A Little Help from another person toileting, which includes using toliet, bedpan, or urinal?: A Little Help from another person bathing (including washing, rinsing, drying)?: A Little Help from another person to put on and taking off regular upper body clothing?: None Help from another person to put on and taking off regular lower body clothing?: A Little 6 Click Score: 20   End of Session Equipment Utilized During Treatment: Gait belt Nurse Communication: Mobility status  Activity Tolerance: Patient tolerated treatment well Patient left: in chair;with call bell/phone within reach;with chair alarm set  OT Visit Diagnosis: Unsteadiness on feet (R26.81);Other abnormalities of gait and mobility (R26.89);Muscle weakness (generalized) (M62.81)                Time: 1610-9604 OT Time Calculation (min): 22 min Charges:  OT General Charges $OT Visit: 1 Visit OT Evaluation $OT Eval Moderate Complexity: 1 Mod G-Codes:     Viva Gallaher MSOT, OTR/L Acute Rehab Pager: [phone] Office: [place] 10/04/2017, 10:36 AM

## 2017-10-04 NOTE — Discharge Summary (Addendum)
Physician Discharge Summary   Patient ID: Derek [name] MRN: [id number] DOB/AGE: 05-13-1935 82 y.o.  Admit date: 10/03/2017 Discharge date: 10/04/2017  Primary Care Physician:  Merrilee Seashore, MD  Discharge Diagnoses:   . Acute ischemic stroke ([place]) . Bradycardia s/ pacemaker placement  . Carotid stenosis . COLD (chronic obstructive lung disease) ([place]) . Atrial fibrillation with slow ventricular response ([place]) . Essential hypertension    Consults:  Neurology Cardiology  Recommendations for Outpatient Follow-up:  1. Home health PT OT arranged by case management 2. Please repeat CBC/BMET at next visit   DIET: heart healthy diet    Allergies:   Allergies  Allergen Reactions  . Other Hives and Dermatitis    -mycins  . Sulfa Antibiotics Rash  . Latex Rash  . Tape Rash    Reaction to adhesive tape - pls use paper tape     DISCHARGE MEDICATIONS: Current Discharge Medication List    START taking these medications   Details  hydrocortisone cream 1 % Apply topically 3 (three) times daily. Apply to affected area on back Qty: 30 g, Refills: 0      CONTINUE these medications which have CHANGED   Details  apixaban (ELIQUIS) 2.5 MG TABS tablet Take 1 tablet (2.5 mg total) by mouth 2 (two) times daily. Hold for 2 days, restart on 10/06/17 AM Qty: 180 tablet, Refills: 3      CONTINUE these medications which have NOT CHANGED   Details  aspirin EC 81 MG tablet Take 81 mg by mouth daily after supper.    Biotin 5000 MCG TABS Take 5,000 mcg by mouth daily.    budesonide-formoterol (SYMBICORT) 160-4.5 MCG/ACT inhaler Inhale 2 puffs into the lungs 2 (two) times daily. Qty: 3 Inhaler, Refills: 3    Choline Fenofibrate (TRILIPIX) 135 MG capsule Take 135 mg by mouth daily after supper.     ezetimibe (ZETIA) 10 MG tablet Take 10 mg by mouth daily after supper.     Febuxostat (ULORIC) 80 MG TABS Take 80 mg by mouth daily after supper.    furosemide (LASIX) 20 MG  tablet Take 1 tablet (20 mg total) by mouth every other day.    GLUCOSAMINE-CHONDROITIN PO Take 1 tablet by mouth 2 (two) times daily. 1500-1200    levothyroxine (SYNTHROID, LEVOTHROID) 88 MCG tablet Take 88 mcg by mouth daily before breakfast.    Magnesium 250 MG TABS Take 250 mg by mouth daily.    Multiple Vitamin (MULTIVITAMIN WITH MINERALS) TABS tablet Take 1 tablet by mouth daily.    Omega-3 Fatty Acids (FISH OIL) 1200 MG CAPS Take 2,400 mg by mouth 2 (two) times daily.    OXYGEN Inhale 2 L into the lungs as needed (for  shortness of breath with exertion only).     pregabalin (LYRICA) 50 MG capsule Take 50 mg by mouth 2 (two) times daily.    rosuvastatin (CRESTOR) 20 MG tablet Take 20 mg by mouth daily after supper.     terazosin (HYTRIN) 5 MG capsule Take 10 mg by mouth 2 (two) times daily.    tiotropium (SPIRIVA) 18 MCG inhalation capsule Take 18 mcg by mouth daily.    Turmeric 500 MG CAPS Take 500 mg by mouth daily.    vitamin B-12 (CYANOCOBALAMIN) 1000 MCG tablet Take 1,000 mcg by mouth daily.         Brief H and P: For complete details please refer to admission H and P, but in brief Derek [name] a 82 y.o.malewith PMH  of CAD h/o CABG, COPD, HTN, Hypothyroidism, Gout, Recent Dx of afib with embolic stroke, (0/24-0/97), bradycardiais presented with left sided numbness. Patient states that he awoke this morning with left sided weakness and numbness. He also reports dizziness and left sided facial weakness. Noted to have bradycardia   Hospital Course:   Acute ischemic stroke [facility]), acute lacunar infarct - Patient presented with left-sided numbness, weakness. - MRI of the brain showed small acute lacunar infarct in the lateral right thalamus, no associated hemorrhage or mass effect - recent workup completed, including 2-D echo on 09/13/17 showed EF of 55%, normal motion - Recent CTA neck: showed 70% right ICA, 50% Lft ICA - cardiology was consulted as bradycardia  was thought to be contributing to his recurrent symptoms, atient was seen by EP cardiology and recommended pacemaker.  - EP cardiology was consulted and pacemaker was placed on 10/15.  - per Dr Leonie Man, okay to discharge home after pacer, no further stroke testing     COPD (chronic obstructive lung disease) ([place]) - Stable, no wheezing    Bradycardia - seen by cardiology, EP cardiology also consulted, recommended pacemaker, placed on 10/15    Essential hypertension - Currently stable, restart terazosin at discharge    Atrial fibrillation with slow ventricular response ([place]) - Patient with embolic CVA 3/53-2/99, continuing apixaban, asa 81mg   - cardiology consulted, possible sinus dysfunction, pacer placed on 10/15 - per cardiology, restart apixaban on 10/17     Carotid stenosis, extra and intracranial stenosis - Patient was recommended after previous admission to follow up with Dr. Franchot Gallo as an outpatient  Hyperlipidemia - LDL 46, goal less than 70, continue Crestor and Zetia   Day of Discharge BP (!) 159/82 (BP Location: Right Arm)   Pulse 60   Temp (!) 97.5 F (36.4 C) (Oral)   Resp 16   Ht 5\' 9"  (1.753 m)   Wt 96 kg (211 lb 9.6 oz)   SpO2 99%   BMI 31.25 kg/m   Physical Exam: General: Alert and awake oriented x3 not in any acute distress. HEENT: anicteric sclera, pupils reactive to light and accommodation CVS: S1-S2 clear no murmur rubs or gallops Chest: clear to auscultation bilaterally, no wheezing rales or rhonchi Abdomen: soft nontender, nondistended, normal bowel sounds Extremities: no cyanosis, clubbing or edema noted bilaterally    The results of significant diagnostics from this hospitalization (including imaging, microbiology, ancillary and laboratory) are listed below for reference.    LAB RESULTS: Basic Metabolic Panel:  Recent Labs Lab 10/03/17 1153 10/03/17 1206  NA 139 142  K 4.4 4.3  CL 106 105  CO2 26  --   GLUCOSE 96 92  BUN 50*  47*  CREATININE 2.52* 2.50*  CALCIUM 9.6  --    Liver Function Tests:  Recent Labs Lab 10/03/17 1153  AST 46*  ALT 28  ALKPHOS 40  BILITOT 1.1  PROT 6.9  ALBUMIN 3.8   No results for input(s): LIPASE, AMYLASE in the last 168 hours. No results for input(s): AMMONIA in the last 168 hours. CBC:  Recent Labs Lab 10/03/17 1153 10/03/17 1206  WBC 5.6  --   NEUTROABS 3.2  --   HGB 13.9 14.3  HCT 43.0 42.0  MCV 94.7  --   PLT 173  --    Cardiac Enzymes: No results for input(s): CKTOTAL, CKMB, CKMBINDEX, TROPONINI in the last 168 hours. BNP: Invalid input(s): POCBNP CBG:  Recent Labs Lab 10/03/17 1159  GLUCAP 92  Significant Diagnostic Studies:  Mr Brain 58 Contrast  Result Date: 10/03/2017 CLINICAL DATA:  82 year old male who woke this morning with left side weakness and numbness. EXAM: MRI HEAD WITHOUT CONTRAST TECHNIQUE: Multiplanar, multiecho pulse sequences of the brain and surrounding structures were obtained without intravenous contrast. COMPARISON:  Head CT without contrast 1107 hours today. Brain MRI 09/12/2017, and earlier. FINDINGS: Brain: Small linear 9 mm focus of abnormal diffusion along the dorsal lateral right thalamus does appear restricted on ADC (series 3, image 27). Faint T2 and FLAIR hyperintensity. No hemorrhage or mass effect. No other convincing restricted diffusion. No midline shift, mass effect, evidence of mass lesion, ventriculomegaly, extra-axial collection or acute intracranial hemorrhage. Cervicomedullary junction and pituitary are within normal limits. Stable and largely normal for age gray and white matter signal elsewhere. Vascular: Major intracranial vascular flow voids are stable since September. Moderate generalized intracranial artery dolichoectasia. Skull and upper cervical spine: Negative. Normal bone marrow signal. Sinuses/Orbits: Stable and negative. Other: Mastoid air cells are well pneumatized and stable. Visible internal auditory  structures appear normal. Scalp and face soft tissues appear negative. IMPRESSION: 1. Small acute lacunar infarct in the lateral right thalamus. No associated hemorrhage or mass effect. 2. Otherwise stable noncontrast MRI appearance of the brain, notable for chronic intracranial artery dolichoectasia. Electronically Signed   By: Genevie Ann M.D.   On: 10/03/2017 15:59   Ct Head Code Stroke Wo Contrast  Result Date: 10/03/2017 CLINICAL DATA:  Code stroke. Right facial droop. Left lower extremity drift. EXAM: CT HEAD WITHOUT CONTRAST TECHNIQUE: Contiguous axial images were obtained from the base of the skull through the vertex without intravenous contrast. COMPARISON:  Brain MRI 09/12/2017 FINDINGS: Brain: No evidence of acute infarction, hemorrhage, hydrocephalus, extra-axial collection or mass lesion/mass effect. Vascular: No hyperdense vessel. Atherosclerotic calcification. Tortuosity of the vertebrobasilar system which is strongly dominant to the right Skull: No acute finding Sinuses/Orbits: No acute finding.  Bilateral cataract resection. Other: Text page with prelim sent on 10/03/2017 at 11:19 am to Dr. Orlena Sheldon ASPECTS [facility] Stroke Program Early CT Score) - Ganglionic level infarction (caudate, lentiform nuclei, internal capsule, insula, M1-M3 cortex): 7 - Supraganglionic infarction (M4-M6 cortex): 3 Total score (0-10 with 10 being normal): 10 IMPRESSION: No acute finding or change from 09/11/2017. Electronically Signed   By: Monte Fantasia M.D.   On: 10/03/2017 11:21    2D ECHO:   Disposition and Follow-up: Discharge Instructions    Diet - low sodium heart healthy    Complete by:  As directed    Discharge instructions    Complete by:  As directed    Hold apixaban for 2 days, restart on Wed am 10/17   Increase activity slowly    Complete by:  As directed        [place]    Merrilee Seashore, MD. Schedule an appointment as soon  as possible for a visit in 2 week(s).   Specialty:  Internal Medicine Contact information: [address] [place] [address] [phone]        Garvin Fila, MD. Schedule an appointment as soon as possible for a visit in 6 week(s).   Specialties:  Neurology, Radiology Contact information: [address] [place] [address] [phone]        Deboraha Sprang, MD. Schedule an appointment as soon as possible for a visit in 2 week(s).   Specialty:  Cardiology Contact information: [address] [facility]  [address]  [address] [phone]            Time spent on Discharge: 85mins   Signed:   Estill Cotta M.D. [facility] 10/04/2017, 4:07 PM Pager: [phone]  Coding query addendum - acute on Chronic kidney disease stage III, baseline creatinine 1.89-2.1   Noga Fogg M.D. [facility] 10/05/2017, 1:56 PM  Pager: [phone]

## 2017-10-04 NOTE — Progress Notes (Signed)
SLP Cancellation Note  Patient Details Name: Derek [name] MRN: [id number] DOB: 1935/12/01   Cancelled treatment:       Reason Eval/Treat Not Completed: Patient at procedure or test/unavailable   Germain Osgood 10/04/2017, 2:46 PM  Germain Osgood, M.A. CCC-SLP [phone]

## 2017-10-04 NOTE — Care Management Note (Addendum)
Case Management Note  Patient Details  Name: Derek [name] MRN: [id number] Date of Birth: 05-Feb-1935  Subjective/Objective:                    Action/Plan: Pt discharging home with [facility] services. CM met with the patient and provided him a list of [facility] agencies. He selected [facility]. [facility] notified and information requested faxed to the number provided; [phone].  Pt has transportation home and assistance at home.   [facility] called and accepted the referral.  Expected Discharge Date:  10/04/17               Expected Discharge Plan:  [place]  In-House Referral:     Discharge planning Services  CM Consult  Post Acute Care Choice:  Home Health Choice offered to:  Patient  DME Arranged:    DME Agency:     HH Arranged:  PT, OT HH Agency:  [facility]  Status of Service:  Completed, signed off  If discussed at [place] of Stay Meetings, dates discussed:    Additional Comments:  Pollie Friar, RN 10/04/2017, 4:45 PM

## 2017-10-04 NOTE — Progress Notes (Signed)
Pt discharged per orders from MD. Pt and family educated on discharge instructions. Pt and family verbalized understanding of discharge instructions. All questions and concerns were addressed. Pt's IV was removed prior to discharge. Pt exited hospital via wheelchair.

## 2017-10-04 NOTE — Progress Notes (Signed)
Overnight, pt ambulated in hall X 2 with RN. No s/s distress or discomfort and tolerated well. Pt bradycardic, HR in 40's. Hr in 50's on ambulation. No chest pain or discomfort overnight.

## 2017-10-04 NOTE — Progress Notes (Signed)
apixoban held for 36 more hrs Resume Wed am 10/17

## 2017-10-04 NOTE — Care Management Obs Status (Signed)
[place] NOTIFICATION   Patient Details  Name: Derek [name] MRN: [id number] Date of Birth: 09-30-1935   Medicare Observation Status Notification Given:  Yes    Pollie Friar, RN 10/04/2017, 4:37 PM

## 2017-10-04 NOTE — Progress Notes (Signed)
STROKE TEAM PROGRESS NOTE   HISTORY OF PRESENT ILLNESS (per record) Patient is very poor historian and no family here.  Per page, LSN 9 am with right sided weakness per EMS although family had said left sided.  CT Brain shows small vessel ischemic disease with no acute changes.  CTA Brain and Neck in 08/2017 showed 29% RICA and 56% LICA stenosis as well as extensive stenosis in vertebrobasilar systems.  He has a history of AAA.  He admits to being dizzy and lightheaded before symptoms this am.  His HR is in the 30's with relatively good BP.   Patient was not administered IV t-PA secondary to AAA.   SUBJECTIVE (INTERVAL HISTORY)  The patient states he developed left leg and arm paresthesias on Saturday and seems to have improved. He had been compliant with taking his eliquis twice daily. He was recently admitted for posterior circulation TIA on 09/13/17 and received IV tPA and made full recovery. He also has significant bradycardia and will need a pacemaker  OBJECTIVE Temp:  [97.6 F (36.4 C)-98.3 F (36.8 C)] 97.7 F (36.5 C) (10/15 0941) Pulse Rate:  [41-84] 47 (10/15 0941) Cardiac Rhythm: Atrial fibrillation (10/15 0706) Resp:  [12-21] 20 (10/15 0941) BP: (111-166)/(44-79) 136/70 (10/15 0941) SpO2:  [89 %-100 %] 96 % (10/15 0941) Weight:  [211 lb 9.6 oz (96 kg)-212 lb 15.4 oz (96.6 kg)] 211 lb 9.6 oz (96 kg) (10/14 2000)  CBC:  Recent Labs Lab 10/03/17 1153 10/03/17 1206  WBC 5.6  --   NEUTROABS 3.2  --   HGB 13.9 14.3  HCT 43.0 42.0  MCV 94.7  --   PLT 173  --     Basic Metabolic Panel:  Recent Labs Lab 10/03/17 1153 10/03/17 1206  NA 139 142  K 4.4 4.3  CL 106 105  CO2 26  --   GLUCOSE 96 92  BUN 50* 47*  CREATININE 2.52* 2.50*  CALCIUM 9.6  --     Lipid Panel:    Component Value Date/Time   CHOL 109 09/12/2017 0217   TRIG 102 09/12/2017 0217   HDL 43 09/12/2017 0217   CHOLHDL 2.5 09/12/2017 0217   VLDL 20 09/12/2017 0217   LDLCALC 46 09/12/2017 0217    HgbA1c:  Lab Results  Component Value Date   HGBA1C 5.4 09/12/2017   Urine Drug Screen: No results found for: LABOPIA, COCAINSCRNUR, LABBENZ, AMPHETMU, THCU, LABBARB  Alcohol Level No results found for: [facility]  IMAGING Mr Brain Wo Contrast 10/03/2017 1. Small acute lacunar infarct in the lateral right thalamus. No associated hemorrhage or mass effect. 2. Otherwise stable noncontrast MRI appearance of the brain, notable for chronic intracranial artery dolichoectasia.  Ct Head Code Stroke Wo Contrast 10/03/2017 No acute finding or change from 09/11/2017.    PHYSICAL EXAM Pleasant elderly Caucasian male not in distress. . Afebrile. Head is nontraumatic. Neck is supple without bruit.    Cardiac exam no murmur or gallop. Lungs are clear to auscultation. Distal pulses are well felt. Neurological Exam :  Awake alert oriented x 3 normal speech and language. Mild left lower face asymmetry. Tongue midline. No drift. Mild diminished fine finger movements on left. Orbits right over left upper extremity. Mild left grip weak.. Normal sensation . Normal coordination. Gait not tested  ASSESSMENT/PLAN Derek [name] is a 82 y.o. male with history of CAD s/p CABG, Afib, stroke, hypertension, COPD presenting with left sided numbness. He did not receive IV t-PA due to AAA.  Stroke:  right thalamic infarct embolic secondary to atrial fibrillation while on Eliquis  Resultant  Mild left hemibody paresthesias  CT head No acute interval changes from 09/13/17  MRI head Small acute R thalamic infarct, possible tiny L medial occipital lesion  MRA head Not performed  Carotid Doppler  Not performed  2D Echo  Recent study 9/24: LVEF 55%, elevated R sided pressure/volume up, moderate LA enlargement  LDL Recent study 9/24: 46   HgbA1c Recent study 9/24: 5.4%  Eliquis for VTE prophylaxis  Diet NPO time specified Except for: [employer], Sips with Meds  Eliquis (apixaban) daily prior to admission,  now on Eliquis (apixaban) daily, wishes to continue current therapy  Ongoing aggressive stroke risk factor management  Therapy recommendations:  pending  Disposition:  pending  Hypertension  Stable, not requiring PRN hydralazine Permissive hypertension (OK if < 180/100) but gradually normalize in 5-7 days Long-term BP goal normotensive <130/90  Hyperlipidemia  Home meds:  zetia, resumed in hospital  LDL 46, goal < 70  Added rosuvastatin 20mg   Continue statin at discharge  Diabetes  HgbA1c 5.4%, goal < 7.0  Controlled  Other Stroke Risk Factors  Advanced age  Obesity, Body mass index is 31.25 kg/m., recommend weight loss, diet and exercise as appropriate   Hx stroke/TIA  Coronary artery disease  Afib on anticoagulation  Other Active Problems  Bradycardia - undergoing pacemaker [facility] day # 1  I have personally examined this patient, reviewed notes, independently viewed imaging studies, participated in medical decision making and plan of care.ROS completed by me personally and pertinent positives fully documented  I have made any additions or clarifications directly to the above note.  He presented with left-sided paresthesias secondary to a small right lateral thalamic infarct likely from atrial fibrillation despite being on eliquis. I have discussed possibility of changing to alternative medications like Pradaxa on Xarelto and also lack of definite clinical data showing benefit over staying on eliquis. Patient wants to continue eliquis and not change over to alternative drug at the present time.Recommend pacemaker placement and then resume eliquis after that. No need for any further stroke related testing as he had a complete workup 3 weeks ago. Long discussion with patient and Dr. Tana Coast and answered questions.I spent  35 minutes in total face-to-face time with the patient, more than 50% of which was spent in counseling and coordination of care,  reviewing test results, reviewing medication and discussing or reviewing the diagnosis of  Embolic stroke, atrial fibrillation  , the prognosis and treatment options.    Antony Contras, MD Medical Director [facility] Pager: [phone] 10/04/2017 12:16 PM   To contact Stroke Continuity provider, please refer to [email or web address] After hours, contact General Neurology

## 2017-10-04 NOTE — Progress Notes (Signed)
[facility]                                                                              Patient Demographics  Derek [name], is a 82 y.o. male, DOB - [birth year], NFA:[id number]  Admit date - 10/03/2017   Admitting Physician Kinnie Feil, MD  Outpatient Primary MD for the patient is Merrilee Seashore, MD  Outpatient specialists:   LOS - 1  days   Medical records reviewed and are as summarized below:    Chief Complaint  Patient presents with  . Code Stroke  . Bradycardia       Brief summary   Derek [name] is a 82 y.o. male with PMH of CAD h/o CABG, COPD, HTN, Hypothyroidism, Gout, Recent Dx of afib with embolic stroke, (4/69-6/29), bradycardia is presented with left sided numbness. Patient states that he awoke this morning with left sided weakness and numbness. He also reports dizziness and left sided facial weakness. Noted to have bradycardia    Assessment & Plan    Principal Problem:   Acute ischemic stroke [facility]), acute lacunar infarct - Patient presented with left-sided numbness, weakness. - MRI of the brain showed small acute lacunar infarct in the lateral right thalamus, no associated hemorrhage or mass effect - recent workup completed, including 2-D echo on 09/13/17 showed EF of 55%, normal motion - Recent CTA neck: showed 70% right ICA, 50% Lft ICA - cardiology was consulted as bradycardia was thought to be contributing to his recurrent symptoms, atient was seen by EP cardiology and recommended pacemaker. Planned today   Active Problems:  COPD (chronic obstructive lung disease) (HCC) - Stable, no wheezing    Bradycardia - seen by cardiology, EP cardiology also consulted, recommended pacemaker    Essential hypertension - Currently stable, restart terazosin at discharge    Atrial fibrillation with slow ventricular response ([place]) - Patient with embolic CVA 5/28-4/13, continuing apixaban, asa 81mg   - cardiology consulted, possible sinus  dysfunction, recommended pacemaker    Carotid stenosis, extra and intracranial stenosis - Patient was recommended after previous admission to follow up with Dr. Franchot Gallo as an outpatient  Hyperlipidemia - LDL 46, goal less than 70, continue Crestor and Zetia   Code Status: full  DVT Prophylaxis: apixaban Family Communication: Discussed in detail with the patient, all imaging results, lab results explained to the patient and son    Disposition Plan: possible today after pacer  Time Spent in minutes   25 minutes  Procedures:    Consultants:   Neuro cardiology  Antimicrobials:      Medications  Scheduled Meds: . apixaban  2.5 mg Oral BID  . aspirin EC  81 mg Oral QPC supper  . ezetimibe  10 mg Oral QPC supper  . febuxostat  40 mg Oral QPM  . hydrocortisone cream   Topical TID  . levothyroxine  88 mcg Oral QAC breakfast  . magnesium gluconate  250 mg Oral Daily  . mometasone-formoterol  2 puff Inhalation BID  . multivitamin with minerals  1 tablet Oral Daily  . pregabalin  50 mg Oral BID  . rosuvastatin  20 mg Oral  QPC supper  . tiotropium  18 mcg Inhalation Daily  . vitamin B-12  1,000 mcg Oral Daily   Continuous Infusions: PRN Meds:.[DISCONTINUED] acetaminophen **OR** [DISCONTINUED] acetaminophen (TYLENOL) oral liquid 160 mg/5 mL **OR** acetaminophen, hydrALAZINE   Antibiotics   Anti-infectives    None        Subjective:   Derek [name] was seen and examined today.  Feels better today. Patient denies dizziness, chest pain, shortness of breath, abdominal pain, N/V/D/C. No acute events overnight.    Objective:   Vitals:   10/03/17 2200 10/04/17 0114 10/04/17 0618 10/04/17 0941  BP: (!) 122/44 136/70 126/79 136/70  Pulse: (!) 41 (!) 45 (!) 55 (!) 47  Resp: 12 16 18 20   Temp: 98.2 F (36.8 C) 98.2 F (36.8 C) 98.3 F (36.8 C) 97.7 F (36.5 C)  TempSrc: Oral Oral Oral Oral  SpO2: 96% 97% 95% 96%  Weight:      Height:        Intake/Output  Summary (Last 24 hours) at 10/04/17 1043 Last data filed at 10/04/17 3500  Gross per 24 hour  Intake                0 ml  Output              350 ml  Net             -350 ml     Wt Readings from Last 3 Encounters:  10/03/17 96 kg (211 lb 9.6 oz)  09/23/17 97.1 kg (214 lb)  09/17/17 103 kg (227 lb)     Exam  General: Alert and oriented x 3, NAD  Eyes:   HEENT:  Atraumatic, normocephalic  Cardiovascular: S1 S2 clear, ireg ireg  Respiratory: Clear to auscultation bilaterally, no wheezing, rales or rhonchi  Gastrointestinal: Soft, nontender, nondistended, + bowel sounds  Ext: no pedal edema bilaterally  Neuro: AAOx3, mild left lower extremity weakness, speech clear  Musculoskeletal: No digital cyanosis, clubbing  Skin: No rashes  Psych: Normal affect and demeanor, alert and oriented x3    Data Reviewed:  I have personally reviewed following labs and imaging studies  Micro Results No results found for this or any previous visit (from the past 240 hour(s)).  Radiology Reports Ct Angio Head W Or Wo Contrast  Result Date: 09/11/2017 CLINICAL DATA:  Stroke EXAM: CT ANGIOGRAPHY HEAD AND NECK CT PERFUSION BRAIN TECHNIQUE: Multidetector CT imaging of the head and neck was performed using the standard protocol during bolus administration of intravenous contrast. Multiplanar CT image reconstructions and MIPs were obtained to evaluate the vascular anatomy. Carotid stenosis measurements (when applicable) are obtained utilizing NASCET criteria, using the distal internal carotid diameter as the denominator. Multiphase CT imaging of the brain was performed following IV bolus contrast injection. Subsequent parametric perfusion maps were calculated using RAPID software. CONTRAST:  100 mL Isovue 370 IV COMPARISON:  CT head 09/11/2017 FINDINGS: CTA NECK FINDINGS Aortic arch: Moderate atherosclerotic calcification in the aortic arch without aneurysm. Proximal great vessels diffusely diseased  with calcification but patent. Right carotid system: Right common carotid artery widely patent with minimal atherosclerotic disease. Heavily calcified plaque at the right carotid bifurcation. 70% diameter stenosis right internal carotid artery Left carotid system: Scattered atherosclerotic plaque in the left common carotid artery without significant stenosis. Atherosclerotic calcification in the left carotid bulb 50% diameter stenosis left internal carotid artery Vertebral arteries: Right vertebral dominant. Large calcified plaque right vertebral artery origin. Proximal and mid right vertebral artery  occluded. There is reconstitution of the distal right vertebral artery with minimal opacification. The right vertebral artery is diffusely calcified distally. Small left vertebral artery with moderate stenosis proximally. Left vertebral artery ends in PICA and does not contribute to the basilar. Skeleton: No acute skeletal abnormality. Other neck: No mass lesion in the neck. Upper chest: Apical emphysema. Bilateral small pleural effusions. Apical scarring bilaterally. Review of the MIP images confirms the above findings CTA HEAD FINDINGS Anterior circulation: Atherosclerotic calcification throughout the right internal carotid artery through the skull base and cavernous segment with moderate stenosis. Hypoplastic right A1 segment. Right middle cerebral artery patent without stenosis Atherosclerotic calcification throughout the left internal carotid artery at the skullbase and throughout the left cavernous carotid with moderate stenosis. Both anterior cerebral arteries are supplied from the left and patent. Left middle cerebral artery patent without significant stenosis or occlusion. Posterior circulation: Severe atherosclerotic distal right vertebral artery which shows faint opacification. The right vertebral artery is occluded in the proximal and mid segment. The basilar is diffusely diseased but patent. There is a  filling defect in the distal basilar extending into the left P1. Left posterior cerebral artery is patent. Right posterior cerebral artery is patent in the P1 segment and then occludes due to thrombus in the right P2 segment. Small left vertebral artery ends in PICA Venous sinuses: Patent Anatomic variants: None Delayed phase: Not performed Review of the MIP images confirms the above findings CT Brain Perfusion Findings: CBF (<30%) Volume: 23mL Perfusion (Tmax>6.0s) volume: 145mL Mismatch Volume: 192mL Infarction Location:None Delayed perfusion is seen throughout the brainstem and right cerebellum. This could be artifactual. There is also delayed perfusion in the watershed territory of both cerebral hemispheres, right greater than left IMPRESSION: 70% diameter stenosis right internal carotid artery due to heavily calcified plaque. Moderate stenosis right cavernous carotid. 50% diameter stenosis left internal carotid artery due to heavily calcified plaque. Moderate stenosis left cavernous carotid Dominant right vertebral artery which is severely diseased and heavily calcified. The right vertebral artery is occluded in the proximal and midportion with faint reconstitution distally. The basilar is diffusely diseased. There is acute thrombus in the distal basilar. Left posterior cerebral artery is patent with thrombus overlying the left PCA origin. There is occlusion of the right P2 segment due to clot. Left vertebral artery is small and ends in PICA Abnormal CT perfusion with multiple areas of delayed perfusion due to severe atherosclerotic disease. No infarct on CT perfusion. These results were called by telephone at the time of interpretation on 09/11/2017 at 7:10 pm to Dr. Rory Percy, who verbally acknowledged these results. Electronically Signed   By: Franchot Gallo M.D.   On: 09/11/2017 19:13   Dg Chest 2 View  Result Date: 09/11/2017 CLINICAL DATA:  Acute ischemic stroke. EXAM: CHEST  2 VIEW COMPARISON:  07/06/2016,  04/02/2016 FINDINGS: Sternotomy wires unchanged. Lungs are adequately inflated and demonstrate mild prominence of the perihilar markings likely vascular congestion/mild edema. Tiny amount of bilateral pleural fluid seen posteriorly. Several bilateral calcified granulomas unchanged. Mild cardiomegaly. Minimal calcified plaque over the thoracic aorta. Mild degenerate change of the spine. IMPRESSION: Mild cardiomegaly with mild vascular congestion/edema. Previous granulomas disease. Aortic Atherosclerosis (ICD10-I70.0). Electronically Signed   By: Marin Olp M.D.   On: 09/11/2017 20:45   Ct Angio Neck W Or Wo Contrast  Result Date: 09/11/2017 CLINICAL DATA:  Stroke EXAM: CT ANGIOGRAPHY HEAD AND NECK CT PERFUSION BRAIN TECHNIQUE: Multidetector CT imaging of the head and neck was performed using  the standard protocol during bolus administration of intravenous contrast. Multiplanar CT image reconstructions and MIPs were obtained to evaluate the vascular anatomy. Carotid stenosis measurements (when applicable) are obtained utilizing NASCET criteria, using the distal internal carotid diameter as the denominator. Multiphase CT imaging of the brain was performed following IV bolus contrast injection. Subsequent parametric perfusion maps were calculated using RAPID software. CONTRAST:  100 mL Isovue 370 IV COMPARISON:  CT head 09/11/2017 FINDINGS: CTA NECK FINDINGS Aortic arch: Moderate atherosclerotic calcification in the aortic arch without aneurysm. Proximal great vessels diffusely diseased with calcification but patent. Right carotid system: Right common carotid artery widely patent with minimal atherosclerotic disease. Heavily calcified plaque at the right carotid bifurcation. 70% diameter stenosis right internal carotid artery Left carotid system: Scattered atherosclerotic plaque in the left common carotid artery without significant stenosis. Atherosclerotic calcification in the left carotid bulb 50% diameter  stenosis left internal carotid artery Vertebral arteries: Right vertebral dominant. Large calcified plaque right vertebral artery origin. Proximal and mid right vertebral artery occluded. There is reconstitution of the distal right vertebral artery with minimal opacification. The right vertebral artery is diffusely calcified distally. Small left vertebral artery with moderate stenosis proximally. Left vertebral artery ends in PICA and does not contribute to the basilar. Skeleton: No acute skeletal abnormality. Other neck: No mass lesion in the neck. Upper chest: Apical emphysema. Bilateral small pleural effusions. Apical scarring bilaterally. Review of the MIP images confirms the above findings CTA HEAD FINDINGS Anterior circulation: Atherosclerotic calcification throughout the right internal carotid artery through the skull base and cavernous segment with moderate stenosis. Hypoplastic right A1 segment. Right middle cerebral artery patent without stenosis Atherosclerotic calcification throughout the left internal carotid artery at the skullbase and throughout the left cavernous carotid with moderate stenosis. Both anterior cerebral arteries are supplied from the left and patent. Left middle cerebral artery patent without significant stenosis or occlusion. Posterior circulation: Severe atherosclerotic distal right vertebral artery which shows faint opacification. The right vertebral artery is occluded in the proximal and mid segment. The basilar is diffusely diseased but patent. There is a filling defect in the distal basilar extending into the left P1. Left posterior cerebral artery is patent. Right posterior cerebral artery is patent in the P1 segment and then occludes due to thrombus in the right P2 segment. Small left vertebral artery ends in PICA Venous sinuses: Patent Anatomic variants: None Delayed phase: Not performed Review of the MIP images confirms the above findings CT Brain Perfusion Findings: CBF  (<30%) Volume: 79mL Perfusion (Tmax>6.0s) volume: 127mL Mismatch Volume: 16mL Infarction Location:None Delayed perfusion is seen throughout the brainstem and right cerebellum. This could be artifactual. There is also delayed perfusion in the watershed territory of both cerebral hemispheres, right greater than left IMPRESSION: 70% diameter stenosis right internal carotid artery due to heavily calcified plaque. Moderate stenosis right cavernous carotid. 50% diameter stenosis left internal carotid artery due to heavily calcified plaque. Moderate stenosis left cavernous carotid Dominant right vertebral artery which is severely diseased and heavily calcified. The right vertebral artery is occluded in the proximal and midportion with faint reconstitution distally. The basilar is diffusely diseased. There is acute thrombus in the distal basilar. Left posterior cerebral artery is patent with thrombus overlying the left PCA origin. There is occlusion of the right P2 segment due to clot. Left vertebral artery is small and ends in PICA Abnormal CT perfusion with multiple areas of delayed perfusion due to severe atherosclerotic disease. No infarct on CT perfusion. These results  were called by telephone at the time of interpretation on 09/11/2017 at 7:10 pm to Dr. Rory Percy, who verbally acknowledged these results. Electronically Signed   By: Franchot Gallo M.D.   On: 09/11/2017 19:13   Mr Brain Wo Contrast  Result Date: 10/03/2017 CLINICAL DATA:  82 year old male who woke this morning with left side weakness and numbness. EXAM: MRI HEAD WITHOUT CONTRAST TECHNIQUE: Multiplanar, multiecho pulse sequences of the brain and surrounding structures were obtained without intravenous contrast. COMPARISON:  Head CT without contrast 1107 hours today. Brain MRI 09/12/2017, and earlier. FINDINGS: Brain: Small linear 9 mm focus of abnormal diffusion along the dorsal lateral right thalamus does appear restricted on ADC (series 3, image 27).  Faint T2 and FLAIR hyperintensity. No hemorrhage or mass effect. No other convincing restricted diffusion. No midline shift, mass effect, evidence of mass lesion, ventriculomegaly, extra-axial collection or acute intracranial hemorrhage. Cervicomedullary junction and pituitary are within normal limits. Stable and largely normal for age gray and white matter signal elsewhere. Vascular: Major intracranial vascular flow voids are stable since September. Moderate generalized intracranial artery dolichoectasia. Skull and upper cervical spine: Negative. Normal bone marrow signal. Sinuses/Orbits: Stable and negative. Other: Mastoid air cells are well pneumatized and stable. Visible internal auditory structures appear normal. Scalp and face soft tissues appear negative. IMPRESSION: 1. Small acute lacunar infarct in the lateral right thalamus. No associated hemorrhage or mass effect. 2. Otherwise stable noncontrast MRI appearance of the brain, notable for chronic intracranial artery dolichoectasia. Electronically Signed   By: Genevie Ann M.D.   On: 10/03/2017 15:59   Mr Brain Wo Contrast  Result Date: 09/12/2017 CLINICAL DATA:  Stroke follow-up.  Post tPA EXAM: MRI HEAD WITHOUT CONTRAST TECHNIQUE: Multiplanar, multiecho pulse sequences of the brain and surrounding structures were obtained without intravenous contrast. COMPARISON:  MRI and CTA 09/11/2017 FINDINGS: Brain: Negative for acute infarct. No significant chronic ischemia. Mild atrophy. Negative for hemorrhage, mass, or fluid collection Vascular: Normal arterial flow voids. Skull and upper cervical spine: Negative Sinuses/Orbits: Bilateral cataract removal. Paranasal sinuses have minimal mucosal edema Other: None IMPRESSION: Negative for acute infarct or hemorrhage. Electronically Signed   By: Franchot Gallo M.D.   On: 09/12/2017 18:01   Ct Cerebral Perfusion W Contrast  Result Date: 09/11/2017 CLINICAL DATA:  Stroke EXAM: CT ANGIOGRAPHY HEAD AND NECK CT PERFUSION  BRAIN TECHNIQUE: Multidetector CT imaging of the head and neck was performed using the standard protocol during bolus administration of intravenous contrast. Multiplanar CT image reconstructions and MIPs were obtained to evaluate the vascular anatomy. Carotid stenosis measurements (when applicable) are obtained utilizing NASCET criteria, using the distal internal carotid diameter as the denominator. Multiphase CT imaging of the brain was performed following IV bolus contrast injection. Subsequent parametric perfusion maps were calculated using RAPID software. CONTRAST:  100 mL Isovue 370 IV COMPARISON:  CT head 09/11/2017 FINDINGS: CTA NECK FINDINGS Aortic arch: Moderate atherosclerotic calcification in the aortic arch without aneurysm. Proximal great vessels diffusely diseased with calcification but patent. Right carotid system: Right common carotid artery widely patent with minimal atherosclerotic disease. Heavily calcified plaque at the right carotid bifurcation. 70% diameter stenosis right internal carotid artery Left carotid system: Scattered atherosclerotic plaque in the left common carotid artery without significant stenosis. Atherosclerotic calcification in the left carotid bulb 50% diameter stenosis left internal carotid artery Vertebral arteries: Right vertebral dominant. Large calcified plaque right vertebral artery origin. Proximal and mid right vertebral artery occluded. There is reconstitution of the distal right vertebral artery with  minimal opacification. The right vertebral artery is diffusely calcified distally. Small left vertebral artery with moderate stenosis proximally. Left vertebral artery ends in PICA and does not contribute to the basilar. Skeleton: No acute skeletal abnormality. Other neck: No mass lesion in the neck. Upper chest: Apical emphysema. Bilateral small pleural effusions. Apical scarring bilaterally. Review of the MIP images confirms the above findings CTA HEAD FINDINGS Anterior  circulation: Atherosclerotic calcification throughout the right internal carotid artery through the skull base and cavernous segment with moderate stenosis. Hypoplastic right A1 segment. Right middle cerebral artery patent without stenosis Atherosclerotic calcification throughout the left internal carotid artery at the skullbase and throughout the left cavernous carotid with moderate stenosis. Both anterior cerebral arteries are supplied from the left and patent. Left middle cerebral artery patent without significant stenosis or occlusion. Posterior circulation: Severe atherosclerotic distal right vertebral artery which shows faint opacification. The right vertebral artery is occluded in the proximal and mid segment. The basilar is diffusely diseased but patent. There is a filling defect in the distal basilar extending into the left P1. Left posterior cerebral artery is patent. Right posterior cerebral artery is patent in the P1 segment and then occludes due to thrombus in the right P2 segment. Small left vertebral artery ends in PICA Venous sinuses: Patent Anatomic variants: None Delayed phase: Not performed Review of the MIP images confirms the above findings CT Brain Perfusion Findings: CBF (<30%) Volume: 30mL Perfusion (Tmax>6.0s) volume: 134mL Mismatch Volume: 156mL Infarction Location:None Delayed perfusion is seen throughout the brainstem and right cerebellum. This could be artifactual. There is also delayed perfusion in the watershed territory of both cerebral hemispheres, right greater than left IMPRESSION: 70% diameter stenosis right internal carotid artery due to heavily calcified plaque. Moderate stenosis right cavernous carotid. 50% diameter stenosis left internal carotid artery due to heavily calcified plaque. Moderate stenosis left cavernous carotid Dominant right vertebral artery which is severely diseased and heavily calcified. The right vertebral artery is occluded in the proximal and midportion  with faint reconstitution distally. The basilar is diffusely diseased. There is acute thrombus in the distal basilar. Left posterior cerebral artery is patent with thrombus overlying the left PCA origin. There is occlusion of the right P2 segment due to clot. Left vertebral artery is small and ends in PICA Abnormal CT perfusion with multiple areas of delayed perfusion due to severe atherosclerotic disease. No infarct on CT perfusion. These results were called by telephone at the time of interpretation on 09/11/2017 at 7:10 pm to Dr. Rory Percy, who verbally acknowledged these results. Electronically Signed   By: Franchot Gallo M.D.   On: 09/11/2017 19:13   Mr Brain Limited Wo Contrast  Result Date: 09/11/2017 CLINICAL DATA:  Basilar occlusion.  Stroke. EXAM: MRI HEAD WITHOUT CONTRAST TECHNIQUE: Multiplanar, multiecho pulse sequences of the brain and surrounding structures were obtained without intravenous contrast. COMPARISON:  CT and CT perfusion 09/11/2017 FINDINGS: Brain: Limited sequences. Diffusion and axial FLAIR imaging only was performed. Negative for acute infarct. Generalized atrophy. Minimal chronic ischemic change in the white matter. IMPRESSION: Negative for acute infarct on limited imaging. These results were called by telephone at the time of interpretation on 09/11/2017 at 7:28 pm to Dr. Amie Portland , who verbally acknowledged these results. Electronically Signed   By: Franchot Gallo M.D.   On: 09/11/2017 19:28   Ct Head Code Stroke Wo Contrast  Result Date: 10/03/2017 CLINICAL DATA:  Code stroke. Right facial droop. Left lower extremity drift. EXAM: CT HEAD WITHOUT CONTRAST  TECHNIQUE: Contiguous axial images were obtained from the base of the skull through the vertex without intravenous contrast. COMPARISON:  Brain MRI 09/12/2017 FINDINGS: Brain: No evidence of acute infarction, hemorrhage, hydrocephalus, extra-axial collection or mass lesion/mass effect. Vascular: No hyperdense vessel.  Atherosclerotic calcification. Tortuosity of the vertebrobasilar system which is strongly dominant to the right Skull: No acute finding Sinuses/Orbits: No acute finding.  Bilateral cataract resection. Other: Text page with prelim sent on 10/03/2017 at 11:19 am to Dr. Orlena Sheldon ASPECTS [facility] Stroke Program Early CT Score) - Ganglionic level infarction (caudate, lentiform nuclei, internal capsule, insula, M1-M3 cortex): 7 - Supraganglionic infarction (M4-M6 cortex): 3 Total score (0-10 with 10 being normal): 10 IMPRESSION: No acute finding or change from 09/11/2017. Electronically Signed   By: Monte Fantasia M.D.   On: 10/03/2017 11:21   Ct Head Code Stroke Wo Contrast  Result Date: 09/11/2017 CLINICAL DATA:  Code stroke. Focal neuro deficit less than 6 hours. Right-sided weakness EXAM: CT HEAD WITHOUT CONTRAST TECHNIQUE: Contiguous axial images were obtained from the base of the skull through the vertex without intravenous contrast. COMPARISON:  MRI 01/17/2015 FINDINGS: Brain: Generalized atrophy. Negative for hydrocephalus. Negative for acute infarct, hemorrhage, mass lesion. Vascular: Extensive calcification in the right vertebral artery and basilar. Calcification also in the cavernous carotid bilaterally. Hyperdensity distal basilar suggestive of acute thrombosis. Skull: Negative Sinuses/Orbits: Mild mucosal edema paranasal sinuses. Bilateral cataract removal. Other: None ASPECTS ([place] Stroke Program Early CT Score) - Ganglionic level infarction (caudate, lentiform nuclei, internal capsule, insula, M1-M3 cortex): 7 - Supraganglionic infarction (M4-M6 cortex): 3 Total score (0-10 with 10 being normal): 10 IMPRESSION: 1. No acute infarct. 2. Hyperdense distal basilar suggestive of acute thrombosis. 3. ASPECTS is 10 4. These results were called by telephone at the time of interpretation on 09/11/2017 at 6:18 pm to Dr. Rory Percy, who verbally acknowledged these results. Electronically Signed   By: Franchot Gallo  M.D.   On: 09/11/2017 18:21   Ir Angio Intra Extracran Sel Com Carotid Innominate Bilat Mod Sed  Result Date: 09/15/2017 CLINICAL DATA:  Acute onset of incoordination, visual symptoms of blurring, diplopia and dysarthria. Abnormal CT angiogram of the head and neck. EXAM: BILATERAL COMMON CAROTID AND INNOMINATE ANGIOGRAPHY; IR ANGIO VERTEBRAL SEL SUBCLAVIAN INNOMINATE UNI RIGHT MOD SED COMPARISON:  CT angiogram of the head and neck of 09/11/2017, and MRI of the brain of 09/11/2017. MEDICATIONS: Heparin 1000 units IV; no antibiotic was administered within 1 hour of the procedure. ANESTHESIA/SEDATION: Versed 0.5 mg IV; Fentanyl 12.  mcg IV. Moderate Sedation Time:  27 minutes. The patient was continuously monitored during the procedure by the interventional radiology nurse under my direct supervision. CONTRAST:  Isovue 300 approximately 60 mL. FLUOROSCOPY TIME:  Fluoroscopy Time: 11 minutes 54 seconds (1313 mGy). COMPLICATIONS: None immediate. TECHNIQUE: Informed written consent was obtained from the patient after a thorough discussion of the procedural risks, benefits and alternatives. All questions were addressed. Maximal Sterile Barrier Technique was utilized including caps, mask, sterile gowns, sterile gloves, sterile drape, hand hygiene and skin antiseptic. A timeout was performed prior to the initiation of the procedure. The right groin was prepped and draped in the usual sterile fashion. Thereafter using modified Seldinger technique, transfemoral access into the right common femoral artery was obtained without difficulty. Over a 0.035 inch guidewire, a 5 French Pinnacle sheath was inserted. Through this, and also over 0.035 inch guidewire, a 5 [place] JB 1 catheter was advanced to the aortic arch region and selectively positioned in the right  common carotid artery, the right subclavian artery, and the left common carotid artery. FINDINGS: Motion artifact is noted on some of the arteriograms. The innominate  artery injection demonstrates the origin of the right common carotid artery to be widely patent. The proximal portion of the right subclavian artery appears heavily calcified without significant stenosis. A selective right subclavian arteriogram demonstrates a prominent stump of a dominant right vertebral artery proximally. The branches of the thyrocervical trunk appear to be widely patent. There is partial reconstitution of the right vertebral artery at the level of the posterior arch of C1 from hypertrophic branches of the ascending cervical branch of the thyrocervical trunk, and the ipsilateral occipital artery. The right vertebrobasilar junction demonstrates severe stenosis just proximal to the basilar artery. Contrast is noted in the proximal basilar artery with slow ascent of contrast distally. There is also retrograde opacification of the vertebral artery in the neck to distal to the stump of the dominant right vertebral artery. The right common carotid arteriogram demonstrates atherosclerotic plaque in the distal right common carotid artery extending into the proximal right internal carotid artery. There is a resultant approximately 75% stenosis of the right internal carotid artery in this region. Distal to this, the right internal carotid artery is seen to opacify to the cranial skull base unimpeded. The right internal carotid petrous and the cavernous segments are widely patent. There is approximately 50% stenosis of the right internal carotid supraclinoid segment. A right posterior communicating artery is seen opacifying the right posterior cerebral artery to the level of P2 P3 where there is slow progression of contrast noted. The right middle cerebral artery opacifies into the capillary and venous phases. The right anterior cerebral artery A1 segment is noted to the be patent with transient contrast ascending into the right A2 segment. The right external carotid artery demonstrates approximately 50%  stenosis proximally. Its branches are normally opacified. More distally, there is retrograde opacification of the right vertebral artery at the level of the posterior arch of C1 from the occipital artery. Subsequent delayed images demonstrate antegrade flow into the right vertebrobasilar junction, the basilar artery, the left posterior cerebral artery and the proximal right posterior cerebral artery. Poor filling of the superior cerebellar arteries and of the anterior-inferior cerebellar arteries is noted. The left common carotid arteriogram demonstrates approximately 50% stenosis of the origin the left external carotid artery. The branches are normally opacified. The left internal carotid artery at the bulb demonstrates approximately 30% stenosis by the NASCET criteria. Mild focal areas of ulceration are seen involving the left internal carotid artery at the bulb. Ascent of contrast is noted in the left internal carotid artery to the cranial skull base. The petrous, cavernous and supraclinoid segments are widely patent. 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 Distal to this the distal cavernous, and the supraclinoid segments are widely patent. Also seen arising in the posterior communicating artery region is a 2.6 mm x 2.1 mm saccular aneurysm. The left middle cerebral artery and the left anterior cerebral artery are seen to opacify into the capillary and venous phases. Cross opacification via the anterior communicating artery of the right anterior cerebral artery A2 segment is noted. 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 High-grade stenosis of the right vertebrobasilar junction with slow antegrade flow into the distal basilar artery. Poor filling distal to the right posterior cerebral artery P2 P3 segment, with arteriosclerotic changes involving the left posterior cerebral artery. Patency of the right posterior communicating artery partially supplying the right posterior cerebral artery. Approximately 75% stenosis of the right internal carotid artery proximally by the NASCET criteria. Approximately 30% stenosis of the left internal carotid artery proximally associated with a small ulceration. Approximately 50% stenosis of the right internal carotid artery supraclinoid segment. Occlusion versus high-grade stenosis of the right anterior cerebral artery at the A1 A2 junction. 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 PLAN: Findings were reviewed with the Neurology MD. Electronically Signed   By: Luanne Bras M.D.   On: 09/14/2017 06:34   Ir Angio Vertebral Sel Subclavian Innominate Uni R Mod Sed  Result Date: 09/15/2017 CLINICAL DATA:  Acute onset of incoordination, visual symptoms of blurring, diplopia and dysarthria. Abnormal CT angiogram of the head and neck. EXAM: BILATERAL COMMON CAROTID AND INNOMINATE ANGIOGRAPHY; IR ANGIO VERTEBRAL SEL SUBCLAVIAN INNOMINATE UNI RIGHT MOD SED COMPARISON:  CT angiogram of the head and neck of 09/11/2017, and MRI of the brain of 09/11/2017. MEDICATIONS: Heparin 1000 units IV; no antibiotic was administered within 1 hour of the procedure. ANESTHESIA/SEDATION: Versed 0.5 mg IV; Fentanyl 12.  mcg IV. Moderate Sedation Time:  27 minutes. The patient was continuously monitored during the procedure by the interventional radiology nurse under my direct supervision. CONTRAST:  Isovue 300 approximately 60 mL. FLUOROSCOPY TIME:  Fluoroscopy Time: 11  minutes 54 seconds (1313 mGy). COMPLICATIONS: None immediate. TECHNIQUE: Informed written consent was obtained from the patient after a thorough discussion of the procedural risks, benefits and alternatives. All questions were addressed. Maximal Sterile Barrier Technique was utilized including caps, mask, sterile gowns, sterile gloves, sterile drape, hand hygiene and skin antiseptic. A timeout was performed prior to the initiation of the procedure. The right groin was prepped and draped in the usual sterile fashion. Thereafter using modified Seldinger technique, transfemoral access into the right common femoral artery was obtained without difficulty. Over a 0.035 inch guidewire, a 5 French Pinnacle sheath was inserted. Through this, and also over 0.035 inch guidewire, a 5 [place] JB 1 catheter was advanced to the aortic arch region and selectively positioned in the right common carotid artery, the right subclavian artery, and the left common carotid artery. FINDINGS: Motion artifact is noted on some of the arteriograms. The innominate artery injection demonstrates the origin of the right common carotid artery to be widely patent. The proximal portion of the right subclavian artery appears heavily calcified without significant stenosis. A selective right subclavian arteriogram demonstrates a prominent stump of a dominant right vertebral artery proximally. The branches of the thyrocervical trunk appear to be widely patent. There is partial reconstitution of the right vertebral artery at the level of the posterior arch of C1 from hypertrophic branches of the ascending cervical branch of the thyrocervical trunk, and the ipsilateral occipital artery. The right vertebrobasilar junction demonstrates severe stenosis just proximal to the basilar artery. Contrast is noted in the proximal basilar artery with slow ascent of contrast distally. There is also retrograde opacification of the vertebral artery in the neck to distal to  the stump of the dominant right vertebral  artery. The right common carotid arteriogram demonstrates atherosclerotic plaque in the distal right common carotid artery extending into the proximal right internal carotid artery. There is a resultant approximately 75% stenosis of the right internal carotid artery in this region. Distal to this, the right internal carotid artery is seen to opacify to the cranial skull base unimpeded. The right internal carotid petrous and the cavernous segments are widely patent. There is approximately 50% stenosis of the right internal carotid supraclinoid segment. A right posterior communicating artery is seen opacifying the right posterior cerebral artery to the level of P2 P3 where there is slow progression of contrast noted. The right middle cerebral artery opacifies into the capillary and venous phases. The right anterior cerebral artery A1 segment is noted to the be patent with transient contrast ascending into the right A2 segment. The right external carotid artery demonstrates approximately 50% stenosis proximally. Its branches are normally opacified. More distally, there is retrograde opacification of the right vertebral artery at the level of the posterior arch of C1 from the occipital artery. Subsequent delayed images demonstrate antegrade flow into the right vertebrobasilar junction, the basilar artery, the left posterior cerebral artery and the proximal right posterior cerebral artery. Poor filling of the superior cerebellar arteries and of the anterior-inferior cerebellar arteries is noted. The left common carotid arteriogram demonstrates approximately 50% stenosis of the origin the left external carotid artery. The branches are normally opacified. The left internal carotid artery at the bulb demonstrates approximately 30% stenosis by the NASCET criteria. Mild focal areas of ulceration are seen involving the left internal carotid artery at the bulb. Ascent of contrast is  noted in the left internal carotid artery to the cranial skull base. The petrous, cavernous and supraclinoid segments are widely patent. 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 Distal to this the distal cavernous, and the supraclinoid segments are widely patent. Also seen arising in the posterior communicating artery region is a 2.6 mm x 2.1 mm saccular aneurysm. The left middle cerebral artery and the left anterior cerebral artery are seen to opacify into the capillary and venous phases. Cross opacification via the anterior communicating artery of the right anterior cerebral artery A2 segment is noted. 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 High-grade stenosis of the right vertebrobasilar junction with slow antegrade flow into the distal basilar artery. Poor filling distal to the right posterior cerebral artery P2 P3 segment, with arteriosclerotic changes involving the left posterior cerebral artery. Patency of the right posterior communicating artery partially supplying the right posterior cerebral artery. Approximately 75% stenosis of the right internal carotid artery proximally by the NASCET criteria. Approximately 30% stenosis of the left internal carotid artery proximally associated with a small ulceration. Approximately 50% stenosis of the right internal carotid artery supraclinoid segment. Occlusion versus high-grade stenosis of the right anterior cerebral artery at the A1 A2 junction. 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 PLAN: Findings were reviewed with the Neurology MD. Electronically Signed   By: Luanne Bras M.D.   On:  09/14/2017 06:34    Lab Data:  CBC:  Recent Labs Lab 10/03/17 1153 10/03/17 1206  WBC 5.6  --   NEUTROABS 3.2  --   HGB 13.9 14.3  HCT 43.0 42.0  MCV 94.7  --   PLT 173  --    Basic Metabolic Panel:  Recent Labs Lab 10/03/17 1153 10/03/17 1206  NA 139 142  K 4.4 4.3  CL 106 105  CO2 26  --   GLUCOSE 96 92  BUN 50* 47*  CREATININE 2.52* 2.50*  CALCIUM 9.6  --    GFR: Estimated Creatinine Clearance: 26.9 mL/min (A) (by C-G formula based on SCr of 2.5 mg/dL (H)). Liver Function Tests:  Recent Labs Lab 10/03/17 1153  AST 46*  ALT 28  ALKPHOS 40  BILITOT 1.1  PROT 6.9  ALBUMIN 3.8   No results for input(s): LIPASE, AMYLASE in the last 168 hours. No results for input(s): AMMONIA in the last 168 hours. Coagulation Profile:  Recent Labs Lab 10/03/17 1153  INR 1.20   Cardiac Enzymes: No results for input(s): CKTOTAL, CKMB, CKMBINDEX, TROPONINI in the last 168 hours. BNP (last 3 results)  Recent Labs  09/17/17 1141  PROBNP 5,156*   HbA1C: No results for input(s): HGBA1C in the last 72 hours. CBG:  Recent Labs Lab 10/03/17 1159  GLUCAP 92   Lipid Profile: No results for input(s): CHOL, HDL, LDLCALC, TRIG, CHOLHDL, LDLDIRECT in the last 72 hours. Thyroid Function Tests: No results for input(s): TSH, T4TOTAL, FREET4, T3FREE, THYROIDAB in the last 72 hours. Anemia Panel: No results for input(s): VITAMINB12, FOLATE, FERRITIN, TIBC, IRON, RETICCTPCT in the last 72 hours. Urine analysis: No results found for: COLORURINE, APPEARANCEUR, LABSPEC, PHURINE, GLUCOSEU, HGBUR, BILIRUBINUR, KETONESUR, PROTEINUR, UROBILINOGEN, NITRITE, LEUKOCYTESUR   Elmo Shumard M.D. [facility] 10/04/2017, 10:43 AM  Pager: [phone] Between 7am to 7pm - call Pager - [phone]  After 7pm go to  [email or web address] - password [email or web address]  Call night coverage person covering after 7pm

## 2017-10-04 NOTE — Care Management CC44 (Signed)
Condition Code 44 Documentation Completed  Patient Details  Name: KENSLEY LARES MRN: [id number] Date of Birth: 31-May-1935   Condition Code 44 given:  Yes Patient signature on Condition Code 44 notice:  Yes Documentation of 2 MD's agreement:  Yes Code 44 added to claim:  Yes    Pollie Friar, RN 10/04/2017, 4:37 PM

## 2017-10-05 ENCOUNTER — Encounter (HOSPITAL_COMMUNITY): Payer: Self-pay | Admitting: Internal Medicine

## 2017-10-05 ENCOUNTER — Ambulatory Visit (INDEPENDENT_AMBULATORY_CARE_PROVIDER_SITE_OTHER): Payer: Self-pay | Admitting: *Deleted

## 2017-10-05 ENCOUNTER — Telehealth: Payer: Self-pay | Admitting: *Deleted

## 2017-10-05 ENCOUNTER — Ambulatory Visit (HOSPITAL_COMMUNITY)
Admission: RE | Admit: 2017-10-05 | Discharge: 2017-10-05 | Disposition: A | Discharge status: Home / Self Care | Payer: Medicare Other | Source: Ambulatory Visit | Attending: Internal Medicine | Admitting: Internal Medicine

## 2017-10-05 DIAGNOSIS — J90 Pleural effusion, not elsewhere classified: Secondary | ICD-10-CM | POA: Diagnosis not present

## 2017-10-05 DIAGNOSIS — Z950 Presence of cardiac pacemaker: Secondary | ICD-10-CM | POA: Diagnosis not present

## 2017-10-05 DIAGNOSIS — D71 Functional disorders of polymorphonuclear neutrophils: Secondary | ICD-10-CM | POA: Insufficient documentation

## 2017-10-05 DIAGNOSIS — J9811 Atelectasis: Secondary | ICD-10-CM | POA: Diagnosis not present

## 2017-10-05 MED FILL — Sodium Chloride Irrigation Soln 0.9%: Qty: 500 | Status: AC

## 2017-10-05 MED FILL — Gentamicin Sulfate Inj 40 MG/ML: INTRAMUSCULAR | Qty: 2 | Status: AC

## 2017-10-05 NOTE — Telephone Encounter (Signed)
Called patient regarding appt with Device clinic to check his ppm, pair with [facility], and obtain cxr. Patient states that the earliest he can come in for an appt is 1:00pm. Address was given to patient. Patient verbalized understanding.

## 2017-10-05 NOTE — Patient Instructions (Signed)
Pacemaker Implantation, Adult, Care After This sheet gives you information about how to care for yourself after your procedure. Your health care provider may also give you more specific instructions. If you have problems or questions, contact your health care provider. What can I expect after the procedure? After the procedure, it is common to have:  Mild pain.  Slight bruising.  Some swelling over the incision.  A slight bump over the skin where the device was placed. Sometimes, it is possible to feel the device under the skin. This is normal.  Follow these instructions at home: Medicines  Take over-the-counter and prescription medicines only as told by your health care provider.  If you were prescribed an antibiotic medicine, take it as told by your health care provider. Do not stop taking the antibiotic even if you start to feel better. Wound care  Do not remove the bandage on your chest until directed to do so by your health care provider.  After your bandage is removed, you may see pieces of tape called skin adhesive strips over the area where the cut was made (incision site). Let them fall off on their own.  Check the incision site every day to make sure it is not infected, bleeding, or starting to pull apart.  Do not use lotions or ointments near the incision site unless directed to do so.  Keep the incision area clean and dry for 2-3 days after the procedure or as directed by your health care provider. It takes several weeks for the incision site to completely heal.  Do not take baths, swim, or use a hot tub for 7-10 days or as otherwise directed by your health care provider. Activity  Do not drive or use heavy machinery while taking prescription pain medicine.  Do not drive for 24 hours if you were given a medicine to help you relax (sedative).  Check with your health care provider before you start to drive or play sports.  Avoid sudden jerking, pulling, or chopping  movements that pull your upper arm far away from your body. Avoid these movements for at least 6 weeks or as long as told by your health care provider.  Do not lift your upper arm above your shoulders for at least 6 weeks or as long as told by your health care provider. This means no tennis, golf, or swimming.  You may go back to work when your health care provider says it is okay. Pacemaker care  You may be shown how to transfer data from your pacemaker through the phone to your health care provider.  Always let all health care providers know about your pacemaker before you have any medical procedures or tests.  Wear a medical ID bracelet or necklace stating that you have a pacemaker. Carry a pacemaker ID card with you at all times.  Your pacemaker battery will last for 5-15 years. Routine checks by your health care provider will let the health care provider know when the battery is starting to run down. The pacemaker will need to be replaced when the battery starts to run down.  Do not use amateur Chief of Staff. Other electrical devices are safe to use, including power tools, lawn mowers, and speakers. If you are unsure of whether something is safe to use, ask your health care provider.  When using your cell phone, hold it to the ear opposite the pacemaker. Do not leave your cell phone in a pocket over the pacemaker.  Avoid places or objects that have a strong electric or magnetic field, including: ? Airport Herbalist. When at the airport, let officials know that you have a pacemaker. ? Power plants. ? Large electrical generators. ? Radiofrequency transmission towers, such as cell phone and radio towers. General instructions  Weigh yourself every day. If you suddenly gain weight, fluid may be building up in your body.  Keep all follow-up visits as told by your health care provider. This is important. Contact a health care provider if:  You gain  weight suddenly.  Your legs or feet swell.  It feels like your heart is fluttering or skipping beats (heart palpitations).  You have chills or a fever.  You have more redness, swelling, or pain around your incisions.  You have more fluid or blood coming from your incisions.  Your incisions feel warm to the touch.  You have pus or a bad smell coming from your incisions. Get help right away if:  You have chest pain.  You have trouble breathing or are short of breath.  You become extremely tired.  You are light-headed or you faint. This information is not intended to replace advice given to you by your health care provider. Make sure you discuss any questions you have with your health care provider. Document Released: 06/26/2005 Document Revised: 09/18/2016 Document Reviewed: 09/18/2016 Elsevier Interactive Patient Education  [name].

## 2017-10-05 NOTE — Telephone Encounter (Signed)
-----   Message from Patsey Berthold, NP sent at 10/04/2017  8:14 PM EDT ----- Regarding: CALL ASAP IN AM So this patient had a PPM put in today.  He was discharged by hospitalist without CXR, device interrogation, [facility] pairing, education, or any appointments (don't ask, I have no idea).    He needs to come in tomorrow for device interrogation, [facility] pairing Erlene Quan can help), and to have CXR done.  Please call early in the am.  Thank you! A

## 2017-10-06 LAB — CUP PACEART INCLINIC DEVICE CHECK
Brady Statistic AP VP Percent: 0 %
Brady Statistic AP VS Percent: 0 %
Brady Statistic RV Percent Paced: 95.47 %
Date Time Interrogation Session: 20181016173406
Implantable Lead Implant Date: 20181015
Implantable Lead Implant Date: 20181015
Implantable Lead Location: 753859
Implantable Lead Model: 5076
Lead Channel Impedance Value: 627 Ohm
Lead Channel Pacing Threshold Pulse Width: 0.4 ms
Lead Channel Sensing Intrinsic Amplitude: 0.875 mV
Lead Channel Sensing Intrinsic Amplitude: 11.125 mV
Lead Channel Setting Pacing Amplitude: 3.5 V
Lead Channel Setting Sensing Sensitivity: 1.2 mV
MDC IDC LEAD LOCATION: 753860
MDC IDC MSMT BATTERY VOLTAGE: 3.2 V
MDC IDC MSMT LEADCHNL RA IMPEDANCE VALUE: 304 Ohm
MDC IDC MSMT LEADCHNL RA IMPEDANCE VALUE: 380 Ohm
MDC IDC MSMT LEADCHNL RA SENSING INTR AMPL: 0.5 mV
MDC IDC MSMT LEADCHNL RV IMPEDANCE VALUE: 494 Ohm
MDC IDC MSMT LEADCHNL RV PACING THRESHOLD AMPLITUDE: 0.5 V
MDC IDC MSMT LEADCHNL RV SENSING INTR AMPL: 10.875 mV
MDC IDC PG IMPLANT DT: 20181015
MDC IDC SET LEADCHNL RA PACING AMPLITUDE: 3.5 V
MDC IDC SET LEADCHNL RV PACING PULSEWIDTH: 0.4 ms
MDC IDC STAT BRADY AS VP PERCENT: 0 %
MDC IDC STAT BRADY AS VS PERCENT: 0 %
MDC IDC STAT BRADY RA PERCENT PACED: 0 %

## 2017-10-06 NOTE — Progress Notes (Signed)
OT Evaluation - Late G-code Entry    10/04/17 1000  OT Time Calculation  OT Start Time (ACUTE ONLY) 0848  OT Stop Time (ACUTE ONLY) 0910  OT Time Calculation (min) 22 min  OT G-codes **NOT FOR INPATIENT CLASS**  Functional Assessment Tool Used Clinical judgement  Functional Limitation Self care  Self Care Current Status (Y8016) CI  Self Care Goal Status (P5374) [place] MSOT, OTR/L Acute Rehab Pager: [phone] Office: [phone]

## 2017-10-06 NOTE — Progress Notes (Signed)
Pacemaker check in clinic s/p implant on 10/04/17(next day check). Normal device function. Threshold, sensing, impedances consistent with previous measurements. Device programmed to maximize longevity. Permanent AF + Eliquis. No high ventricular rates noted. Device programmed at acute settings. Histogram distribution appropriate for patient activity level. Device programmed to optimize intrinsic conduction. Patient education completed including post op care and restrictions. Patient sent for post op cxr. Patient to follow up with a wound check on 10/29 @ 1200.

## 2017-10-07 ENCOUNTER — Ambulatory Visit: Payer: Medicare Other | Admitting: Physician Assistant

## 2017-10-18 ENCOUNTER — Ambulatory Visit (INDEPENDENT_AMBULATORY_CARE_PROVIDER_SITE_OTHER): Payer: Medicare Other | Admitting: *Deleted

## 2017-10-18 DIAGNOSIS — I4891 Unspecified atrial fibrillation: Secondary | ICD-10-CM | POA: Diagnosis not present

## 2017-10-18 DIAGNOSIS — R001 Bradycardia, unspecified: Secondary | ICD-10-CM

## 2017-10-18 DIAGNOSIS — I4819 Other persistent atrial fibrillation: Secondary | ICD-10-CM

## 2017-10-18 DIAGNOSIS — Z6833 Body mass index (BMI) 33.0-33.9, adult: Secondary | ICD-10-CM | POA: Diagnosis not present

## 2017-10-18 DIAGNOSIS — I481 Persistent atrial fibrillation: Secondary | ICD-10-CM

## 2017-10-18 DIAGNOSIS — Z950 Presence of cardiac pacemaker: Secondary | ICD-10-CM | POA: Diagnosis not present

## 2017-10-18 DIAGNOSIS — I129 Hypertensive chronic kidney disease with stage 1 through stage 4 chronic kidney disease, or unspecified chronic kidney disease: Secondary | ICD-10-CM | POA: Diagnosis not present

## 2017-10-18 DIAGNOSIS — N179 Acute kidney failure, unspecified: Secondary | ICD-10-CM | POA: Diagnosis not present

## 2017-10-18 DIAGNOSIS — N183 Chronic kidney disease, stage 3 (moderate): Secondary | ICD-10-CM | POA: Diagnosis not present

## 2017-10-18 DIAGNOSIS — Z7901 Long term (current) use of anticoagulants: Secondary | ICD-10-CM | POA: Diagnosis not present

## 2017-10-18 LAB — CUP PACEART INCLINIC DEVICE CHECK
Battery Remaining Longevity: 148 mo
Battery Voltage: 3.2 V
Brady Statistic AP VP Percent: 0 %
Brady Statistic AS VP Percent: 0 %
Brady Statistic AS VS Percent: 0 %
Brady Statistic RV Percent Paced: 99.11 %
Date Time Interrogation Session: 20181029162328
Implantable Lead Implant Date: 20181015
Implantable Lead Location: 753859
Implantable Lead Location: 753860
Implantable Lead Model: 5076
Implantable Lead Model: 5076
Implantable Pulse Generator Implant Date: 20181015
Lead Channel Impedance Value: 285 Ohm
Lead Channel Impedance Value: 570 Ohm
Lead Channel Pacing Threshold Amplitude: 0.75 V
Lead Channel Sensing Intrinsic Amplitude: 0.625 mV
Lead Channel Setting Pacing Amplitude: 3.5 V
Lead Channel Setting Pacing Pulse Width: 0.4 ms
MDC IDC LEAD IMPLANT DT: 20181015
MDC IDC MSMT LEADCHNL RA IMPEDANCE VALUE: 361 Ohm
MDC IDC MSMT LEADCHNL RV IMPEDANCE VALUE: 456 Ohm
MDC IDC MSMT LEADCHNL RV PACING THRESHOLD PULSEWIDTH: 0.4 ms
MDC IDC SET LEADCHNL RA PACING AMPLITUDE: 3.5 V
MDC IDC SET LEADCHNL RV SENSING SENSITIVITY: 1.2 mV
MDC IDC STAT BRADY AP VS PERCENT: 0 %
MDC IDC STAT BRADY RA PERCENT PACED: 0.01 %

## 2017-10-18 NOTE — Progress Notes (Signed)
Wound check appointment. Dermabond removed. Wound without redness. Incision edges approximated, wound well healed. Slight edema present over device site, healing ecchymosis noted, SK saw patient and advised no intervention necessary.   Normal device function. Thresholds, sensing, and impedances consistent with implant measurements. Device programmed at 3.5V with auto capture programmed on for extra safety margin until 3 month visit. RA/RV max impedances decreased to 2000ohms per protocol. Histogram distribution appropriate for patient and level of activity. 100% AT/AF burden, +Eliquis. No high ventricular rates noted. Patient educated about wound care, arm mobility, lifting restrictions, and [employer] app. ROV with SK on 01/20/18.

## 2017-10-19 ENCOUNTER — Other Ambulatory Visit: Payer: Self-pay | Admitting: *Deleted

## 2017-10-19 ENCOUNTER — Ambulatory Visit (INDEPENDENT_AMBULATORY_CARE_PROVIDER_SITE_OTHER): Payer: Self-pay | Admitting: *Deleted

## 2017-10-19 DIAGNOSIS — Z950 Presence of cardiac pacemaker: Secondary | ICD-10-CM

## 2017-10-19 NOTE — Progress Notes (Signed)
Remote pacemaker transmission.   

## 2017-10-19 NOTE — Patient Outreach (Addendum)
[facility]) Care Management  10/19/2017  Derek [name] November 03, 1935 [id number]  EMMI- Stroke RED ON EMMI ALERT DAY#:13 DATE: 10/18/17 RED ALERT: [name] to follow-up appointment? No   Outreach telephone call to patient. HIPAA verified with patient. Patient is dependent upon his niece to manage his medical care. Patient admitted, "He is not familiar with all of these appointments and doctors involved in his medical care". Patient received his hospital discharge instructions, however he didn't read the instructions. He stated, "His niece most likely read the instructions". RN CM and patient discussed hospital discharge follow-up appointments. Per EMR, the patient needed to schedule several outpatient appointments. Patient stated only one visit was scheduled for his wound pacer check, which was on completed on 10/18/17. Patient needed to schedule an appointment with his primary MD and the Neurologist. Patient stated, "He had to many doctors involved in his medical care". Patient asked who was the doctor who placed his pacemaker in the hospital. RN CM informed patient, she would notify the MD offices regarding his follow-up appointments. RN CM informed the patient, the MD offices will be contacting him to arrange his appointment times.   Patient reported, he manages his medications, independently. He voiced taking his medications as prescribed, including Aspirin, Eliquis, and Zetia. Per patient, "He adheres to a low sodium diet". He discussed being cleared by the physician to drive. He plans to transport himself to his medical appointments. Patient unable to voice signs/symptoms of a stroke. RN CM educated patient about signs/symptoms of a stroke. Patient declined to receive educational materials describing signs/symptoms of a stroke. He stated, "He was given a stack of paperwork by the hospital". Patient reported, PT from [facility] made a home visit. According to patient, the PT  determined he was doing well and services were discontinued the same day as the initial evaluation. Patient uses oxygen, as needed. He lives alone and his niece has limited ability to assist patient.     Plan: RN CM will contact patient's primary MD and Neurologist for appointment needs.  RN CM advised patient to contact RN CM for any needs or concerns. RN CM will notify [facility] Case Management Assistant regarding case closure.   Update: 10/20/17 at 0945: Patient has a scheduled appointment with his primary MD on 10/20/17 at 0930. He has an upcoming appointment with the Neurologist on 12/06/17.  [name],  RN, BSN, MHA/MSL, [place] Telephonic Care Manager Coordinator [facility] Direct Phone: [phone] Toll Free: [phone] Fax: [phone]

## 2017-10-20 ENCOUNTER — Encounter: Payer: Self-pay | Admitting: *Deleted

## 2017-10-20 DIAGNOSIS — Z950 Presence of cardiac pacemaker: Secondary | ICD-10-CM | POA: Diagnosis not present

## 2017-10-20 DIAGNOSIS — Z09 Encounter for follow-up examination after completed treatment for conditions other than malignant neoplasm: Secondary | ICD-10-CM | POA: Diagnosis not present

## 2017-10-20 DIAGNOSIS — I639 Cerebral infarction, unspecified: Secondary | ICD-10-CM | POA: Diagnosis not present

## 2017-10-20 DIAGNOSIS — I10 Essential (primary) hypertension: Secondary | ICD-10-CM | POA: Diagnosis not present

## 2017-10-20 DIAGNOSIS — J449 Chronic obstructive pulmonary disease, unspecified: Secondary | ICD-10-CM | POA: Diagnosis not present

## 2017-10-20 DIAGNOSIS — M109 Gout, unspecified: Secondary | ICD-10-CM | POA: Diagnosis not present

## 2017-10-20 DIAGNOSIS — E039 Hypothyroidism, unspecified: Secondary | ICD-10-CM | POA: Diagnosis not present

## 2017-10-20 DIAGNOSIS — Z23 Encounter for immunization: Secondary | ICD-10-CM | POA: Diagnosis not present

## 2017-10-21 LAB — CUP PACEART REMOTE DEVICE CHECK
Battery Voltage: 3.2 V
Brady Statistic AS VP Percent: 0 %
Brady Statistic AS VS Percent: 0 %
Brady Statistic RA Percent Paced: 0.01 %
Implantable Lead Implant Date: 20181015
Implantable Lead Location: 753859
Implantable Lead Model: 5076
Lead Channel Impedance Value: 361 Ohm
Lead Channel Pacing Threshold Amplitude: 0.5 V
Lead Channel Pacing Threshold Pulse Width: 0.4 ms
Lead Channel Sensing Intrinsic Amplitude: 0.375 mV
Lead Channel Sensing Intrinsic Amplitude: 0.375 mV
Lead Channel Sensing Intrinsic Amplitude: 16.125 mV
Lead Channel Setting Pacing Amplitude: 3.5 V
Lead Channel Setting Pacing Pulse Width: 0.4 ms
MDC IDC LEAD IMPLANT DT: 20181015
MDC IDC LEAD LOCATION: 753860
MDC IDC MSMT BATTERY REMAINING LONGEVITY: 147 mo
MDC IDC MSMT LEADCHNL RA IMPEDANCE VALUE: 285 Ohm
MDC IDC MSMT LEADCHNL RV IMPEDANCE VALUE: 456 Ohm
MDC IDC MSMT LEADCHNL RV IMPEDANCE VALUE: 551 Ohm
MDC IDC MSMT LEADCHNL RV SENSING INTR AMPL: 16.125 mV
MDC IDC PG IMPLANT DT: 20181015
MDC IDC SESS DTM: 20181030114836
MDC IDC SET LEADCHNL RA PACING AMPLITUDE: 3.5 V
MDC IDC SET LEADCHNL RV SENSING SENSITIVITY: 1.2 mV
MDC IDC STAT BRADY AP VP PERCENT: 0 %
MDC IDC STAT BRADY AP VS PERCENT: 0 %
MDC IDC STAT BRADY RV PERCENT PACED: 99.61 %

## 2017-10-27 ENCOUNTER — Ambulatory Visit (INDEPENDENT_AMBULATORY_CARE_PROVIDER_SITE_OTHER): Payer: Medicare Other | Admitting: Internal Medicine

## 2017-10-27 ENCOUNTER — Encounter: Payer: Self-pay | Admitting: Cardiology

## 2017-10-27 ENCOUNTER — Encounter: Payer: Self-pay | Admitting: Internal Medicine

## 2017-10-27 VITALS — BP 112/68 | HR 64 | Ht 69.0 in | Wt 203.4 lb

## 2017-10-27 DIAGNOSIS — I639 Cerebral infarction, unspecified: Secondary | ICD-10-CM

## 2017-10-27 DIAGNOSIS — J9611 Chronic respiratory failure with hypoxia: Secondary | ICD-10-CM

## 2017-10-27 DIAGNOSIS — J449 Chronic obstructive pulmonary disease, unspecified: Secondary | ICD-10-CM

## 2017-10-27 NOTE — Progress Notes (Signed)
Subjective:     Patient ID: Derek [name], male   DOB: 25-Feb-1935,    MRN: [id number]    Brief patient profile:  82 yowm quit smoking 2001 "smoker's cough" and wt 210  resolved then started cough again 2008/9 assoc doe > rx  spiriva  helped  But cough continued/ worse in winter then in [place] 03/11/16 admit with pna/copd> back to baseline but referred to pulmonary clinic 04/02/2016 by Dr Wilson Singer ? Copd severity> proved to have GOLD II criteria 04/17/2016     History of Present Illness  04/02/2016 1st Onset Pulmonary office visit/ Derek [name]   Chief Complaint  Patient presents with  . Pulmonary Consult    Referred by Dr. Wilson Singer. Pt states 2 wks ago while in [place], he was hospitalized x 5 days for PNA and COPD exacerbation.  He states that today her breathing is "excellent". He has o2 at home but has not needed to use this.   has proair but not using while on spiriva dpi   Has 02 but not using  Off pred x one week s relapse - legs swelled on pred, some better off but not resolved  Not limited by breathing from desired activities  But not aerobically active = MMRC1 = can walk nl pace, flat grade, can't hurry or go uphills or steps s sob rec No change   except ok to take furosemide 20 mg one daily if needed for swelling   Plan A = Automatic = Spiriva one daily each am Plan B = Backup Only use your albuterol(proair) as rescue   04/17/2016  f/u ov/Derek [name] re: GOLD II copd by pfts today  Chief Complaint  Patient presents with  . Follow-up    PFT's done today at [facility] Pt states that his breathing is doing well. He has not had to use albutderol inhaler. No new co's today.   no change doe = MMRC1 = can walk nl pace, flat grade, can't hurry or go uphills or steps s sob   rec Spiriva respimat or Stiolto would be better options than spiriva handihaler    07/27/2016  f/u ov/Derek [name] re: GOLD II  COPD/ ab pattern since hot weather  x1 m better p adding spiriva  Chief Complaint  Patient presents with  . Follow-up     Pt reports breathing is stable. Denies any wheezing/chest tightness.   Was also needing saba at hs but did not call for eval / all symptoms resolved on advair 100/50 one bid including noct and heat intol related  And back to [facility] 1 doe  rec Plan A = Automatic = Advair 250/50 one twice daily/ Spiriva one daily each am(like high octane fuel)  Plan B = Backup Only use your albuterol(proair)  as a rescue medication  - ok to leave off spiriva x one week prior to next ov   10/27/2016  f/u ov/Derek [name] re:  GOLD II copd/ advair 250/ spiriva dpi  Chief Complaint  Patient presents with  . Follow-up    Breathing is doing well. He has only used rescue inhaler x 1 since his last visit.   doe = MMRC1 = can walk nl pace, flat grade, can't hurry or go uphills or steps s sob   rec  Continue to take the lasix (furosemide) 20 mg one daily if needed for swelling    02/17/2017  f/u ov/Derek [name] re:  advair 250 / spiriva dpi  Chief Complaint  Patient presents with  . Follow-up  Pt here for o2 recert. He states he only uses o2 maybe once per wk if he gets SOB after exertion.   one hour per week if over does it sits down and wear 02, does not use it with exertion/ does not monitor his own sats  Leg swelling is better Doe still = mmrc1 rec Only use the -02 2lpm while walking - goal is to keep your saturations always above 90% but especially while walking     04/26/2017  f/u ov/Derek [name] re:  GOLD II copd/ Advair / spiriva  Chief Complaint  Patient presents with  . Follow-up    Breathing is overall doing well. He has occ raspy voice, uses albuterol inhaler about every 2 wks for this and he states it helps.   walks with rolator but limited by L hip pain on 2lpm exertion only but very limited a acitivity overall rec Try substituting for advair = symbicort 160 x 2  Take 2 puffs first thing in am and then another 2 puffs about 12 hours later.  Please schedule a follow up visit in 3 months but call sooner if needed   Add consider change spiriva dpi to respimat     07/27/2017  f/u ov/Derek [name] re: GOLD II copd / advair and spiriva dpi Chief Complaint  Patient presents with  . Follow-up    Breathing is doing well. He uses his albuterol inhaler 2-3 x per wk on average.    some mouth/ throat irritation from advair and has not yet started symbicort as he had lots of advair left over from 90 day supply No change doe = MMRC1 = can walk nl pace, flat grade, can't hurry or go uphills or steps s sob   rec Plan A = Automatic =  Stop advair and start symbicort 160 Take 2 puffs first thing in am and then another 2 puffs about 12 hours later.  Work on inhaler technique:  Plan B = Backup Only use your albuterol as a rescue medication  Please schedule a follow up visit in 3 months but call sooner if needed  with all medications /inhalers/ solutions in hand so we can verify exactly what you are taking. This includes all medications from all doctors and over the counters Add: Check to see how he's using 02      10/27/2017  f/u ov/Derek [name] re: COPD GOLD II / maint symb 160/spiriva Chief Complaint  Patient presents with  . Follow-up    Breathing is doing well and he denies any new co's. His cardiologist d/c'ed his albuterol inhaler.   S/p pacemaker back to baseline = MMRC1 = can walk nl pace, flat grade, can't hurry or go uphills or steps s sob  Using 02 after walks - does not check while walking  Has hosp bed usually sleeps flat s 02  Cards took his saba away    No obvious day to day or daytime variability or assoc excess/ purulent sputum or mucus plugs or hemoptysis or cp or chest tightness, subjective wheeze or overt sinus or hb symptoms. No unusual exp hx or h/o childhood pna/ asthma or knowledge of premature birth.  Sleeping ok flat or rolled onto side most nocts without nocturnal  or early am exacerbation  of respiratory  c/o's or need for noct saba. Also denies any obvious fluctuation of symptoms with weather or  environmental changes or other aggravating or alleviating factors except as outlined above   Current Allergies, Complete Past Medical History, Past Surgical  History, Family History, and Social History were reviewed in [employer] record.  ROS  The following are not active complaints unless bolded Hoarseness, sore throat, dysphagia, dental problems, itching, sneezing,  nasal congestion or discharge of excess mucus or purulent secretions, ear ache,   fever, chills, sweats,  intended wt loss or wt gain, classically pleuritic or exertional cp,  orthopnea pnd or leg swelling, presyncope, palpitations, abdominal pain, anorexia, nausea, vomiting, diarrhea  or change in bowel habits or change in bladder habits, change in stools or change in urine, dysuria, hematuria,  rash, arthralgias, visual complaints, headache, numbness, weakness or ataxia or problems with walking or coordination,  change in mood/affect or memory.        Current Meds  Medication Sig  . apixaban (ELIQUIS) 2.5 MG TABS tablet Take 1 tablet (2.5 mg total) by mouth 2 (two) times daily. Hold for 2 days, restart on 10/06/17 AM  . aspirin EC 81 MG tablet Take 81 mg by mouth daily after supper.  . Biotin 5000 MCG TABS Take 5,000 mcg by mouth daily.  . budesonide-formoterol (SYMBICORT) 160-4.5 MCG/ACT inhaler Inhale 2 puffs into the lungs 2 (two) times daily.  . Choline Fenofibrate (TRILIPIX) 135 MG capsule Take 135 mg by mouth daily after supper.   . ezetimibe (ZETIA) 10 MG tablet Take 10 mg by mouth daily after supper.   . Febuxostat (ULORIC) 80 MG TABS Take 80 mg by mouth daily after supper.  . furosemide (LASIX) 20 MG tablet Take 1 tablet (20 mg total) by mouth every other day.  Marland Kitchen GLUCOSAMINE-CHONDROITIN PO Take 1 tablet by mouth 2 (two) times daily. 1500-1200  . levothyroxine (SYNTHROID, LEVOTHROID) 88 MCG tablet Take 88 mcg by mouth daily before breakfast.  . Magnesium 250 MG TABS Take 250 mg by mouth daily.  .  Multiple Vitamin (MULTIVITAMIN WITH MINERALS) TABS tablet Take 1 tablet by mouth daily.  . Omega-3 Fatty Acids (FISH OIL) 1200 MG CAPS Take 2,400 mg by mouth 2 (two) times daily.  . OXYGEN Inhale 2 L into the lungs as needed (for  shortness of breath with exertion only).   . pregabalin (LYRICA) 50 MG capsule Take 50 mg by mouth 2 (two) times daily.  . rosuvastatin (CRESTOR) 20 MG tablet Take 20 mg by mouth daily after supper.   . terazosin (HYTRIN) 5 MG capsule Take 10 mg by mouth 2 (two) times daily.  Marland Kitchen tiotropium (SPIRIVA) 18 MCG inhalation capsule Take 18 mcg by mouth daily.  . Turmeric 500 MG CAPS Take 500 mg by mouth daily.  . vitamin B-12 (CYANOCOBALAMIN) 1000 MCG tablet Take 1,000 mcg by mouth daily.                       Objective:   Physical Exam   amb obese wm nad     10/27/2017        203  07/27/2017          221  04/26/2017          226 02/17/2017        231  10/27/2016        234  07/27/2016          230  04/17/2016        232  04/02/16 239 lb 3.4 oz (108.505 kg)  11/06/15 226 lb (102.513 kg)  04/02/15 236 lb (107.049 kg)       HEENT: nl dentition, turbinates bilaterally, and oropharynx. Nl external  ear canals without cough reflex   NECK :  without JVD/Nodes/TM/ nl carotid upstrokes bilaterally   LUNGS: no acc muscle use,  Nl contour chest  With decreased bs bilaterally without cough on insp or exp maneuvers   CV:  RRR  no s3 or murmur or increase in P2, and  No significant  pitting  Edema either ankle   ABD:  Quite obese but  soft and nontender with nl inspiratory excursion in the supine position. No bruits or organomegaly appreciated, bowel sounds nl  MS:  Nl gait/ ext warm without deformities, calf tenderness, cyanosis or clubbing No obvious joint restrictions   SKIN: warm and dry without lesions    NEURO:  alert, approp, nl sensorium with  no motor or cerebellar deficits apparent.        I personally reviewed images and agree with radiology  impression as follows:  CXR:   10/05/17 1. Left-sided pacing device as above.  Negative for a pneumothorax 2. Tiny pleural effusions.  Streaky atelectasis or scar at the bases 3. Borderline cardiomegaly 4. Prior granulomatous disease    Assessment:

## 2017-10-27 NOTE — Patient Instructions (Addendum)
Symbicort 160 Take 2 puffs first thing in am and then another 2 puffs about 12 hours later and follow  Am symbicort with capsule of spiriva   Ok to use 02 with exercise or if your 02 sats drop less than 90% but otherwise no need      Please schedule a follow up visit in 3 months but call sooner if needed  with all medications /inhalers/ solutions in hand so we can verify exactly what you are taking. This includes all medications from all doctors and over the counters

## 2017-10-28 ENCOUNTER — Encounter: Payer: Self-pay | Admitting: Internal Medicine

## 2017-10-28 NOTE — Assessment & Plan Note (Signed)
Quit smoking 2001  - PFT's  04/17/2016  FEV1 2.00 (72 % ) ratio 60  p no % improvement from saba p no prior to study with DLCO  38 % corrects to 50 % for alv volume   - 7//2017 worse on spiriva monotherapy > added advair 100 > ov 07/27/2016 changed to advair 250 - 07/27/2017  After extensive coaching HFA effectiveness =    90% > change advair to symb 160 2bid due to hoarseness / continue spiriva dpi as gets it free from [place]   Adequate control on present rx, reviewed in detail with pt > no change in rx needed

## 2017-10-28 NOTE — Assessment & Plan Note (Signed)
Placed 02 in [place] in May 2017  =  2lpm 24/7 but using prn instead  -   02/17/2017  Patient Saturations on Room Air at Rest = 95% Patient Saturations on Room Air while Ambulating = 88% p one lap = 185 ft mod pace Patient Saturations on 2 Liters of oxygen while Ambulating = 95%  10/28/2017  Using  02 only with ex if sats < 90%

## 2017-10-29 DIAGNOSIS — M533 Sacrococcygeal disorders, not elsewhere classified: Secondary | ICD-10-CM | POA: Diagnosis not present

## 2017-10-29 DIAGNOSIS — N189 Chronic kidney disease, unspecified: Secondary | ICD-10-CM | POA: Diagnosis not present

## 2017-10-29 DIAGNOSIS — R5383 Other fatigue: Secondary | ICD-10-CM | POA: Diagnosis not present

## 2017-10-29 DIAGNOSIS — J449 Chronic obstructive pulmonary disease, unspecified: Secondary | ICD-10-CM | POA: Diagnosis not present

## 2017-10-29 DIAGNOSIS — M545 Low back pain: Secondary | ICD-10-CM | POA: Diagnosis not present

## 2017-11-02 ENCOUNTER — Ambulatory Visit (INDEPENDENT_AMBULATORY_CARE_PROVIDER_SITE_OTHER): Payer: Self-pay | Admitting: *Deleted

## 2017-11-02 DIAGNOSIS — Z950 Presence of cardiac pacemaker: Secondary | ICD-10-CM

## 2017-11-02 NOTE — Progress Notes (Signed)
Remote pacemaker transmission.   

## 2017-11-03 DIAGNOSIS — H4922 Sixth [abducent] nerve palsy, left eye: Secondary | ICD-10-CM | POA: Diagnosis not present

## 2017-11-03 DIAGNOSIS — H532 Diplopia: Secondary | ICD-10-CM | POA: Diagnosis not present

## 2017-11-03 DIAGNOSIS — H02115 Cicatricial ectropion of left lower eyelid: Secondary | ICD-10-CM | POA: Diagnosis not present

## 2017-11-03 DIAGNOSIS — Z961 Presence of intraocular lens: Secondary | ICD-10-CM | POA: Diagnosis not present

## 2017-11-03 DIAGNOSIS — H11822 Conjunctivochalasis, left eye: Secondary | ICD-10-CM | POA: Diagnosis not present

## 2017-11-03 LAB — CUP PACEART REMOTE DEVICE CHECK
Battery Voltage: 3.19 V
Brady Statistic AS VP Percent: 0 %
Brady Statistic AS VS Percent: 0 %
Brady Statistic RA Percent Paced: 0 %
Date Time Interrogation Session: 20181113050702
Implantable Lead Implant Date: 20181015
Implantable Lead Location: 753859
Implantable Lead Model: 5076
Lead Channel Impedance Value: 380 Ohm
Lead Channel Pacing Threshold Amplitude: 0.625 V
Lead Channel Pacing Threshold Pulse Width: 0.4 ms
Lead Channel Sensing Intrinsic Amplitude: 0.75 mV
Lead Channel Sensing Intrinsic Amplitude: 0.75 mV
Lead Channel Sensing Intrinsic Amplitude: 16.375 mV
Lead Channel Setting Pacing Amplitude: 3.5 V
Lead Channel Setting Pacing Pulse Width: 0.4 ms
MDC IDC LEAD IMPLANT DT: 20181015
MDC IDC LEAD LOCATION: 753860
MDC IDC MSMT BATTERY REMAINING LONGEVITY: 145 mo
MDC IDC MSMT LEADCHNL RA IMPEDANCE VALUE: 285 Ohm
MDC IDC MSMT LEADCHNL RV IMPEDANCE VALUE: 437 Ohm
MDC IDC MSMT LEADCHNL RV IMPEDANCE VALUE: 532 Ohm
MDC IDC MSMT LEADCHNL RV SENSING INTR AMPL: 16.375 mV
MDC IDC PG IMPLANT DT: 20181015
MDC IDC SET LEADCHNL RA PACING AMPLITUDE: 3.5 V
MDC IDC SET LEADCHNL RV SENSING SENSITIVITY: 1.2 mV
MDC IDC STAT BRADY AP VP PERCENT: 0 %
MDC IDC STAT BRADY AP VS PERCENT: 0 %
MDC IDC STAT BRADY RV PERCENT PACED: 99.74 %

## 2017-11-05 ENCOUNTER — Encounter: Payer: Self-pay | Admitting: Cardiology

## 2017-11-16 ENCOUNTER — Ambulatory Visit (HOSPITAL_COMMUNITY): Admission: RE | Admit: 2017-11-16 | Payer: Medicare Other | Source: Ambulatory Visit

## 2017-11-17 ENCOUNTER — Encounter: Payer: Self-pay | Admitting: Interventional Cardiology

## 2017-11-18 ENCOUNTER — Encounter: Payer: Self-pay | Admitting: Family

## 2017-11-18 ENCOUNTER — Ambulatory Visit (HOSPITAL_COMMUNITY)
Admission: RE | Admit: 2017-11-18 | Discharge: 2017-11-18 | Disposition: A | Discharge status: Home / Self Care | Payer: Medicare Other | Source: Ambulatory Visit | Attending: Family | Admitting: Family

## 2017-11-18 ENCOUNTER — Ambulatory Visit (INDEPENDENT_AMBULATORY_CARE_PROVIDER_SITE_OTHER): Payer: Medicare Other | Admitting: Family

## 2017-11-18 VITALS — BP 115/70 | HR 73 | Temp 97.8°F | Resp 17 | Wt 202.0 lb

## 2017-11-18 DIAGNOSIS — Z87891 Personal history of nicotine dependence: Secondary | ICD-10-CM | POA: Insufficient documentation

## 2017-11-18 DIAGNOSIS — I639 Cerebral infarction, unspecified: Secondary | ICD-10-CM | POA: Diagnosis not present

## 2017-11-18 DIAGNOSIS — I714 Abdominal aortic aneurysm, without rupture, unspecified: Secondary | ICD-10-CM

## 2017-11-18 NOTE — Patient Instructions (Signed)
Before your next abdominal ultrasound:  Take two Extra-Strength Gas-X capsules at bedtime the night before the test. Take another two Extra-Strength Gas-X capsules 3 hours before the test.  Avoid gas forming foods the day before the test.       Abdominal Aortic Aneurysm Blood pumps away from the heart through tubes (blood vessels) called arteries. Aneurysms are weak or damaged places in the wall of an artery. It bulges out like a balloon. An abdominal aortic aneurysm happens in the main artery of the body (aorta). It can burst or tear, causing bleeding inside the body. This is an emergency. It needs treatment right away. What are the causes? The exact cause is unknown. Things that could cause this problem include:  Fat and other substances building up in the lining of a tube.  Swelling of the walls of a blood vessel.  Certain tissue diseases.  Belly (abdominal) trauma.  An infection in the main artery of the body.  What increases the risk? There are things that make it more likely for you to have an aneurysm. These include:  Being over the age of 80 years old.  Having high blood pressure (hypertension).  Being a male.  Being white.  Being very overweight (obese).  Having a family history of aneurysm.  Using tobacco products.  What are the signs or symptoms? Symptoms depend on the size of the aneurysm and how fast it grows. There may not be symptoms. If symptoms occur, they can include:  Pain (belly, side, lower back, or groin).  Feeling full after eating a small amount of food.  Feeling sick to your stomach (nauseous), throwing up (vomiting), or both.  Feeling a lump in your belly that feels like it is beating (pulsating).  Feeling like you will pass out (faint).  How is this treated?  Medicine to control blood pressure and pain.  Imaging tests to see if the aneurysm gets bigger.  Surgery. How is this prevented? To lessen your chance of getting this  condition:  Stop smoking. Stop chewing tobacco.  Limit or avoid alcohol.  Keep your blood pressure, blood sugar, and cholesterol within normal limits.  Eat less salt.  Eat foods low in saturated fats and cholesterol. These are found in animal and whole dairy products.  Eat more fiber. Fiber is found in whole grains, vegetables, and fruits.  Keep a healthy weight.  Stay active and exercise often.  This information is not intended to replace advice given to you by your health care provider. Make sure you discuss any questions you have with your health care provider. Document Released: 04/03/2013 Document Revised: 05/14/2016 Document Reviewed: 01/06/2013 Elsevier Interactive Patient Education  2017 [employer]  

## 2017-11-18 NOTE — Progress Notes (Signed)
[facility] [place]   CC: Follow up Abdominal Aortic Aneurysm  History of Present Illness  Derek [name] is a 82 y.o. (1935/01/18) male patient that Dr. Oneida Alar has been monitoring and presents today for evaluation of abdominal aortic aneurysm.  Previous studies demonstrate an AAA, measuring 3.84 cm on 09/28/13.   The patient does have chronic back pain, his AAA was found as part of evaluation of his sciatica. He states occasional mild LLQ abdominal pain but no acute abdominal pain.   He admits to being sedentary; encouraged him to do some moderate exercise at least 30 min./day, at least 5 days/week, states he has exercises given to him after a course of physical therapy, states he rarely does them. I encourage him to resume these.    The patient denies claudication in legs with walking, his sciatica is in his left leg which is relieved by Lyrica.   The patient reports in 1997 that he might have had a TIA manifested as unilateral facial drooping, drooling, feeling tired; he was evaluated at the time and does not know the results; no further TIA episodes.  He reports dyspnea with exertion, attributes to hx of smoking for 55 years, quit in 2001; this is his primary barrier to walking or physical activity. He had physical therapy for pain in both hips, does not know if this is from OA or lumbar spine etiology; his walking is also limited by bilateral hips pain that seems to radiate down both legs. He states Lyrica helps this pain a great deal.  He had 5 vessel CABG in 2007, states he is doing well enough that he no longer needs to see his cardiologist.  He had a pacemaker insertion in October 2018. He had 2 strokes in October 2018 as manifested by left eye amaurosis, left hemiparesis, denies any residual neurological deficits.  He has been taking Eliquis since his strokes in October 2018, also takes a daily 81 mg ASA, daily statin.   He was diagnosed with asthma in  addition to his COPD.   Pt Diabetic: No Pt smoker: former smoker, quit in 2001   Past Medical History:  Diagnosis Date  . AAA (abdominal aortic aneurysm) ([place])   . Arthritis   . COPD (chronic obstructive pulmonary disease) ([place])   . Coronary artery disease   . Hypertension   . Hypothyroidism   . Myocardial infarction ([place])   . Shortness of breath    on exertion;  has productive cough at times  . Sleep apnea    states mild . no cpap   Past Surgical History:  Procedure Laterality Date  . APPENDECTOMY  1963  . CATARACT EXTRACTION     w/ lid rise  . CORONARY ARTERY BYPASS GRAFT  2007  . EYE SURGERY Right March 2016   Cataract  . EYE SURGERY Bilateral May 2016   Eyebrow Lift  . EYE SURGERY Left June 2016   Cataract  . IR ANGIO INTRA EXTRACRAN SEL COM CAROTID INNOMINATE BILAT MOD SED  09/12/2017  . IR ANGIO VERTEBRAL SEL SUBCLAVIAN INNOMINATE UNI R MOD SED  09/12/2017  . KNEE ARTHROSCOPY  02/26/2012   Procedure: ARTHROSCOPY KNEE;  Surgeon: Sydnee Cabal, MD;  Location: [facility];  Service: Orthopedics;  Laterality: Left;  medial menisectomy and chondraplasty  . PACEMAKER IMPLANT N/A 10/04/2017   Procedure: PACEMAKER IMPLANT;  Surgeon: Deboraha Sprang, MD;  Location: [facility];  Service: Cardiovascular;  Laterality: N/A;  Social History Social History   Socioeconomic History  . Marital status: Divorced    Spouse name: Not on file  . Number of children: Not on file  . Years of education: 12+  . Highest education level: Not on file  Social Needs  . Financial resource strain: Not on file  . Food insecurity - worry: Not on file  . Food insecurity - inability: Not on file  . Transportation needs - medical: Not on file  . Transportation needs - non-medical: Not on file  Occupational History  . Occupation: Retired   Tobacco Use  . Smoking status: Former Smoker    Packs/day: 0.75    Years: 51.00    Pack years: 38.25    Types: Cigarettes    Last  attempt to quit: 02/23/2000    Years since quitting: 17.7  . Smokeless tobacco: Never Used  Substance and Sexual Activity  . Alcohol use: Yes    Alcohol/week: 0.0 - 0.6 oz    Comment: Moderate to little  . Drug use: No  . Sexual activity: Not on file  Other Topics Concern  . Not on file  Social History Narrative   Patient lives at home alone    Patient is retired    Patient has 3 years of college    Family History Family History  Problem Relation Age of Onset  . Diabetes Mother   . Heart disease Mother        Before age 37  . Diabetes Sister   . Hypertension Sister   . Diabetes Brother   . Hypertension Brother   . Hyperlipidemia Brother   . Hypertension Son   . Heart attack Daughter     Current Outpatient Medications on File Prior to Visit  Medication Sig Dispense Refill  . apixaban (ELIQUIS) 2.5 MG TABS tablet Take 1 tablet (2.5 mg total) by mouth 2 (two) times daily. Hold for 2 days, restart on 10/06/17 AM 180 tablet 3  . aspirin EC 81 MG tablet Take 81 mg by mouth daily after supper.    . Biotin 5000 MCG TABS Take 5,000 mcg by mouth daily.    . budesonide-formoterol (SYMBICORT) 160-4.5 MCG/ACT inhaler Inhale 2 puffs into the lungs 2 (two) times daily. 3 Inhaler 3  . Choline Fenofibrate (TRILIPIX) 135 MG capsule Take 135 mg by mouth daily after supper.     . ezetimibe (ZETIA) 10 MG tablet Take 10 mg by mouth daily after supper.     . Febuxostat (ULORIC) 80 MG TABS Take 80 mg by mouth daily after supper.    . furosemide (LASIX) 20 MG tablet Take 1 tablet (20 mg total) by mouth every other day.    Marland Kitchen GLUCOSAMINE-CHONDROITIN PO Take 1 tablet by mouth 2 (two) times daily. 1500-1200    . levothyroxine (SYNTHROID, LEVOTHROID) 88 MCG tablet Take 88 mcg by mouth daily before breakfast.    . Magnesium 250 MG TABS Take 250 mg by mouth daily.    . Multiple Vitamin (MULTIVITAMIN WITH MINERALS) TABS tablet Take 1 tablet by mouth daily.    . Omega-3 Fatty Acids (FISH OIL) 1200 MG CAPS  Take 2,400 mg by mouth 2 (two) times daily.    . pregabalin (LYRICA) 50 MG capsule Take 50 mg by mouth 2 (two) times daily.    . rosuvastatin (CRESTOR) 20 MG tablet Take 20 mg by mouth daily after supper.     . terazosin (HYTRIN) 5 MG capsule Take 10 mg by mouth 2 (two)  times daily.    Marland Kitchen tiotropium (SPIRIVA) 18 MCG inhalation capsule Take 18 mcg by mouth daily.    . vitamin B-12 (CYANOCOBALAMIN) 1000 MCG tablet Take 1,000 mcg by mouth daily.    . OXYGEN Inhale 2 L into the lungs as needed (for  shortness of breath with exertion only).      No current facility-administered medications on file prior to visit.    Allergies  Allergen Reactions  . Other Hives and Dermatitis    -mycins  . Sulfa Antibiotics Rash  . Latex Rash  . Tape Rash    Reaction to adhesive tape - pls use paper tape    ROS: See HPI for pertinent positives and negatives.  Physical Examination  Vitals:   11/18/17 0842 11/18/17 0844  BP: 126/70 115/70  Pulse: 73   Resp: 17   Temp: 97.8 F (36.6 C)   TempSrc: Oral   SpO2: 94%   Weight: 202 lb (91.6 kg)    Body mass index is 29.83 kg/m.  General: A&O x 3, WDWD male.  Pulmonary: Sym exp, respirations are non labored, good air movt, CTAB, no rales, rhonchi, or wheezing.  Cardiac: Regular rhythm, no detected murmur.   Carotid Bruits Left Right   Negative  Negative    Abdominal aortic pulse is not palpable  Radial pulses are 2+ palpable   VASCULAR EXAM: LE Pulses  LEFT  RIGHT   FEMORAL  1+ palpable not palpable, large panus, obese   POPLITEAL  not palpable  not palpable  POSTERIOR TIBIAL  1+palpable  1+ palpable   DORSALIS PEDIS ANTERIOR TIBIAL  Not palpable  Not palpable    Gastrointestinal: soft, NTND, -G/R, - HSM, - masses palpated, - CVAT B, large panus.  Musculoskeletal: M/S 5/5 throughout, Extremities without ischemic changes, no peripheral edema.  Skin: No rash, no cellulitis, no ulcers.  Neurologic: CN  2-12 intact except slight left facial droop only when he smiles, Pain and light touch intact in extremities, Motor exam as listed above     DATA  AAA Duplex (11/18/2017):  Previous size: 4.2 cm (Date: 11-05-16)   Current size:  4.3 cm (Date: 11-18-17); Right CIA: 1.2 cm; Left CIA: 1.4 cm. Decreased visualization of the abdominal vasculature due to overlying bowel gas and patient body habitus. No significant stenosis of the abdominal aorta.    Medical Decision Making  The patient is a 82 y.o. male who presents with asymptomatic AAA with no significant increase in size, 4.3 cm today, based on limited visualization.   Based on this patient's exam and diagnostic studies, the patient will follow up in 1 year  with the following studies: AAA duplex.  Consideration for repair of AAA would be made when the size is 5.5 cm, growth > 1 cm/yr, and symptomatic status.        The patient was given information about AAA including signs, symptoms, treatment, and how to minimize the risk of enlargement and rupture of aneurysms.    I emphasized the importance of maximal medical management including strict control of blood pressure, blood glucose, and lipid levels, antiplatelet agents, obtaining regular exercise, and continued cessation of smoking.   The patient was advised to call 911 should the patient experience sudden onset abdominal or back pain.   Thank you for allowing [place] to participate in this patient's care.  Clemon Chambers, RN, MSN, FNP-C [facility] [place] Office: [phone]  Clinic Physician: Oneida Alar  11/18/2017, 8:54 AM

## 2017-11-24 NOTE — Progress Notes (Signed)
duplicate

## 2017-11-25 ENCOUNTER — Encounter: Payer: Self-pay | Admitting: Interventional Cardiology

## 2017-11-25 ENCOUNTER — Ambulatory Visit (INDEPENDENT_AMBULATORY_CARE_PROVIDER_SITE_OTHER): Payer: Medicare Other | Admitting: Interventional Cardiology

## 2017-11-25 VITALS — BP 122/64 | HR 71 | Ht 69.0 in | Wt 204.2 lb

## 2017-11-25 DIAGNOSIS — I4430 Unspecified atrioventricular block: Secondary | ICD-10-CM | POA: Diagnosis not present

## 2017-11-25 DIAGNOSIS — I714 Abdominal aortic aneurysm, without rupture, unspecified: Secondary | ICD-10-CM

## 2017-11-25 DIAGNOSIS — I25810 Atherosclerosis of coronary artery bypass graft(s) without angina pectoris: Secondary | ICD-10-CM

## 2017-11-25 DIAGNOSIS — I779 Disorder of arteries and arterioles, unspecified: Secondary | ICD-10-CM

## 2017-11-25 DIAGNOSIS — G450 Vertebro-basilar artery syndrome: Secondary | ICD-10-CM

## 2017-11-25 DIAGNOSIS — I7409 Other arterial embolism and thrombosis of abdominal aorta: Secondary | ICD-10-CM

## 2017-11-25 DIAGNOSIS — I4891 Unspecified atrial fibrillation: Secondary | ICD-10-CM | POA: Diagnosis not present

## 2017-11-25 DIAGNOSIS — I639 Cerebral infarction, unspecified: Secondary | ICD-10-CM

## 2017-11-25 DIAGNOSIS — I10 Essential (primary) hypertension: Secondary | ICD-10-CM

## 2017-11-25 NOTE — Patient Instructions (Signed)

## 2017-11-25 NOTE — Progress Notes (Signed)
Cardiology Office Note    Date:  11/25/2017   ID:  Derek [name], DOB 05/02/1935, MRN [id number]  PCP:  Merrilee Seashore, MD  Cardiologist: Sinclair Grooms, MD   Chief Complaint  Patient presents with  . Irregular Heart Beat    Tachycardia-bradycardia syndrome  . Atrial Fibrillation    History of Present Illness:  Derek [name] is a 82 y.o. male with history of tachycardia-bradycardia syndrome, atrial fibrillation, embolic CVA, chronic anticoagulation therapy, CKD stage IV, coagulation with Eliquis.  Tachycardia-bradycardia syndrome has been treated with a pacemaker since I last saw him during his hospital stay.  Overall the patient is doing quite well.  I saw him during the first embolic CVA.  He had a second event and subsequent pacemaker implantation in October 2018.  He has done well since that time.  No blood in his urine or stool.  He denies chest pain.  Since pacemaker insertion he has felt tightness in his left arm the left axilla.  There is no noticeable swelling.  He denies orthopnea, PND, exertional complaints.    Past Medical History:  Diagnosis Date  . AAA (abdominal aortic aneurysm) ([place])   . Arthritis   . COPD (chronic obstructive pulmonary disease) ([place])   . Coronary artery disease   . Hypertension   . Hypothyroidism   . Myocardial infarction ([place])   . Shortness of breath    on exertion;  has productive cough at times  . Sleep apnea    states mild . no cpap    Past Surgical History:  Procedure Laterality Date  . APPENDECTOMY  1963  . CATARACT EXTRACTION     w/ lid rise  . CORONARY ARTERY BYPASS GRAFT  2007  . EYE SURGERY Right March 2016   Cataract  . EYE SURGERY Bilateral May 2016   Eyebrow Lift  . EYE SURGERY Left June 2016   Cataract  . IR ANGIO INTRA EXTRACRAN SEL COM CAROTID INNOMINATE BILAT MOD SED  09/12/2017  . IR ANGIO VERTEBRAL SEL SUBCLAVIAN INNOMINATE UNI R MOD SED  09/12/2017  . KNEE ARTHROSCOPY  02/26/2012   Procedure:  ARTHROSCOPY KNEE;  Surgeon: Sydnee Cabal, MD;  Location: [facility];  Service: Orthopedics;  Laterality: Left;  medial menisectomy and chondraplasty  . PACEMAKER IMPLANT N/A 10/04/2017   Procedure: PACEMAKER IMPLANT;  Surgeon: Deboraha Sprang, MD;  Location: [facility] [facility];  Service: Cardiovascular;  Laterality: N/A;    Current Medications: Outpatient Medications Prior to Visit  Medication Sig Dispense Refill  . apixaban (ELIQUIS) 2.5 MG TABS tablet Take 1 tablet (2.5 mg total) by mouth 2 (two) times daily. Hold for 2 days, restart on 10/06/17 AM 180 tablet 3  . aspirin EC 81 MG tablet Take 81 mg by mouth daily after supper.    . Biotin 5000 MCG TABS Take 5,000 mcg by mouth daily.    . budesonide-formoterol (SYMBICORT) 160-4.5 MCG/ACT inhaler Inhale 2 puffs into the lungs 2 (two) times daily. 3 Inhaler 3  . Choline Fenofibrate (TRILIPIX) 135 MG capsule Take 135 mg by mouth daily after supper.     . ezetimibe (ZETIA) 10 MG tablet Take 10 mg by mouth daily after supper.     . Febuxostat (ULORIC) 80 MG TABS Take 80 mg by mouth daily after supper.    . furosemide (LASIX) 20 MG tablet Take 1 tablet (20 mg total) by mouth every other day.    Marland Kitchen GLUCOSAMINE-CHONDROITIN PO Take 1 tablet  by mouth 2 (two) times daily. 1500-1200    . levothyroxine (SYNTHROID, LEVOTHROID) 88 MCG tablet Take 88 mcg by mouth daily before breakfast.    . Magnesium 250 MG TABS Take 250 mg by mouth daily.    . Multiple Vitamin (MULTIVITAMIN WITH MINERALS) TABS tablet Take 1 tablet by mouth daily.    . Omega-3 Fatty Acids (FISH OIL) 1200 MG CAPS Take 2,400 mg by mouth 2 (two) times daily.    . OXYGEN Inhale 2 L into the lungs as needed (for  shortness of breath with exertion only).     . pregabalin (LYRICA) 50 MG capsule Take 50 mg by mouth 2 (two) times daily.    . rosuvastatin (CRESTOR) 20 MG tablet Take 20 mg by mouth daily after supper.     . terazosin (HYTRIN) 5 MG capsule Take 10 mg by mouth 2 (two)  times daily.    Marland Kitchen tiotropium (SPIRIVA) 18 MCG inhalation capsule Take 18 mcg by mouth daily.    . vitamin B-12 (CYANOCOBALAMIN) 1000 MCG tablet Take 1,000 mcg by mouth daily.     No facility-administered medications prior to visit.      Allergies:   Other; Sulfa antibiotics; Latex; and Tape   Social History   Socioeconomic History  . Marital status: Divorced    Spouse name: None  . Number of children: None  . Years of education: 12+  . Highest education level: None  Social Needs  . Financial resource strain: None  . Food insecurity - worry: None  . Food insecurity - inability: None  . Transportation needs - medical: None  . Transportation needs - non-medical: None  Occupational History  . Occupation: Retired   Tobacco Use  . Smoking status: Former Smoker    Packs/day: 0.75    Years: 51.00    Pack years: 38.25    Types: Cigarettes    Last attempt to quit: 02/23/2000    Years since quitting: 17.7  . Smokeless tobacco: Never Used  Substance and Sexual Activity  . Alcohol use: Yes    Alcohol/week: 0.0 - 0.6 oz    Comment: Moderate to little  . Drug use: No  . Sexual activity: None  Other Topics Concern  . None  Social History Narrative   Patient lives at home alone    Patient is retired    Patient has 3 years of college      Family History:  The patient's family history includes Diabetes in his brother, mother, and sister; Heart attack in his daughter; Heart disease in his mother; Hyperlipidemia in his brother; Hypertension in his brother, sister, and son.   ROS:   Please see the history of present illness.    As noted above left arm and axillary "swelling". All other systems reviewed and are negative.   PHYSICAL EXAM:   VS:  BP 122/64   Pulse 71   Ht 5\' 9"  (1.753 m)   Wt 204 lb 3.2 oz (92.6 kg)   BMI 30.16 kg/m    GEN: Well nourished, well developed, in no acute distress  HEENT: normal  Neck: no JVD, carotid bruits, or masses Cardiac: RRR; no murmurs,  rubs, or gallops,no edema  Respiratory:  clear to auscultation bilaterally, normal work of breathing GI: soft, nontender, nondistended, + BS MS: no deformity or atrophy  Skin: warm and dry, no rash Neuro:  Alert and Oriented x 3, Strength and sensation are intact Psych: euthymic mood, full affect  Wt Readings from Last  3 Encounters:  11/25/17 204 lb 3.2 oz (92.6 kg)  11/18/17 202 lb (91.6 kg)  10/27/17 203 lb 6.4 oz (92.3 kg)      Studies/Labs Reviewed:   EKG:  EKG  No new data  Recent Labs: 09/12/2017: TSH 1.794 09/17/2017: NT-Pro BNP 5,156 10/03/2017: ALT 28; BUN 47; Creatinine, Ser 2.50; Hemoglobin 14.3; Platelets 173; Potassium 4.3; Sodium 142   Lipid Panel    Component Value Date/Time   CHOL 109 09/12/2017 0217   TRIG 102 09/12/2017 0217   HDL 43 09/12/2017 0217   CHOLHDL 2.5 09/12/2017 0217   VLDL 20 09/12/2017 0217   LDLCALC 46 09/12/2017 0217    Additional studies/ records that were reviewed today include:  Pacemaker implantation October 04, 2017 by Dr. Virl Axe.    ASSESSMENT:    1. Atrial fibrillation with slow ventricular response ([place])   2. Coronary artery disease involving coronary bypass graft of native heart without angina pectoris   3. Vertebral basilar insufficiency   4. AAA (abdominal aortic aneurysm) without rupture ([place])   5. Aorto-iliac disease ([place])   6. AV block   7. Essential hypertension      PLAN:  In order of problems listed above:  1. Stable from cardiac standpoint.  Has permanent pacemaker.  Rate is under excellent control.  He is on chronic anticoagulation therapy. 2. Has continuous left arm axillary discomfort since pacemaker implantation.  States he feels the arm and axilla or swollen.  There is no exertional component.  He will discussed with Dr. Caryl Comes. 3. 2 strokelike syndromes most recently in October 2018.  Currently stable without recurrences. 4. Not addressed 5. Permanent pacemaker implantation 6. Excellent  control  Continue current therapy.  If he develops any exertional chest discomfort we need to consider ischemia.    Medication Adjustments/Labs and Tests Ordered: Current medicines are reviewed at length with the patient today.  Concerns regarding medicines are outlined above.  Medication changes, Labs and Tests ordered today are listed in the Patient Instructions below. Patient Instructions  Medication Instructions:  Your physician recommends that you continue on your current medications as directed. Please refer to the Current Medication list given to you today.  Labwork: None  Testing/Procedures: None  Follow-Up: Your physician wants you to follow-up in: 6 months with Dr. Tamala Julian.  You will receive a reminder letter in the mail two months in advance. If you don't receive a letter, please call our office to schedule the follow-up appointment.   Any Other Special Instructions Will Be Listed Below (If Applicable).     If you need a refill on your cardiac medications before your next appointment, please call your pharmacy.      Signed, Sinclair Grooms, MD  11/25/2017 11:30 AM    [facility] [address], [place], [place]  [address] Phone: [phone]; Fax: [phone]

## 2017-12-02 DIAGNOSIS — N179 Acute kidney failure, unspecified: Secondary | ICD-10-CM | POA: Diagnosis not present

## 2017-12-02 DIAGNOSIS — Z6833 Body mass index (BMI) 33.0-33.9, adult: Secondary | ICD-10-CM | POA: Diagnosis not present

## 2017-12-02 DIAGNOSIS — E8770 Fluid overload, unspecified: Secondary | ICD-10-CM | POA: Diagnosis not present

## 2017-12-02 DIAGNOSIS — Z7901 Long term (current) use of anticoagulants: Secondary | ICD-10-CM | POA: Diagnosis not present

## 2017-12-02 DIAGNOSIS — I129 Hypertensive chronic kidney disease with stage 1 through stage 4 chronic kidney disease, or unspecified chronic kidney disease: Secondary | ICD-10-CM | POA: Diagnosis not present

## 2017-12-02 DIAGNOSIS — I4891 Unspecified atrial fibrillation: Secondary | ICD-10-CM | POA: Diagnosis not present

## 2017-12-02 DIAGNOSIS — Z950 Presence of cardiac pacemaker: Secondary | ICD-10-CM | POA: Diagnosis not present

## 2017-12-02 DIAGNOSIS — N183 Chronic kidney disease, stage 3 (moderate): Secondary | ICD-10-CM | POA: Diagnosis not present

## 2017-12-06 ENCOUNTER — Ambulatory Visit (INDEPENDENT_AMBULATORY_CARE_PROVIDER_SITE_OTHER): Payer: Medicare Other | Admitting: Neurology

## 2017-12-06 ENCOUNTER — Encounter: Payer: Self-pay | Admitting: Neurology

## 2017-12-06 VITALS — BP 129/75 | HR 66 | Wt 203.0 lb

## 2017-12-06 DIAGNOSIS — I639 Cerebral infarction, unspecified: Secondary | ICD-10-CM

## 2017-12-06 DIAGNOSIS — I6310 Cerebral infarction due to embolism of unspecified precerebral artery: Secondary | ICD-10-CM | POA: Diagnosis not present

## 2017-12-06 NOTE — Patient Instructions (Signed)
I had a long d/w patient about his recent embolic strokes,atrial fibrillation, risk for recurrent stroke/TIAs, personally independently reviewed imaging studies and stroke evaluation results and answered questions.Continue Eliquis (apixaban) daily  for secondary stroke prevention and maintain strict control of hypertension with blood pressure goal below 130/90, diabetes with hemoglobin A1c goal below 6.5% and lipids with LDL cholesterol goal below 70 mg/dL. I also advised the patient to eat a healthy diet with plenty of whole grains, cereals, fruits and vegetables, exercise regularly and maintain ideal body weight Followup in the future with my nurse practitioner Janett Billow in 6 months or call earlier if necessary  Stroke Prevention Some medical conditions and behaviors are associated with an increased chance of having a stroke. You may prevent a stroke by making healthy choices and managing medical conditions. How can I reduce my risk of having a stroke?  Stay physically active. Get at least 30 minutes of activity on most or all days.  Do not smoke. It may also be helpful to avoid exposure to secondhand smoke.  Limit alcohol use. Moderate alcohol use is considered to be: ? No more than 2 drinks per day for men. ? No more than 1 drink per day for nonpregnant women.  Eat healthy foods. This involves: ? Eating 5 or more servings of fruits and vegetables a day. ? Making dietary changes that address high blood pressure (hypertension), high cholesterol, diabetes, or obesity.  Manage your cholesterol levels. ? Making food choices that are high in fiber and low in saturated fat, trans fat, and cholesterol may control cholesterol levels. ? Take any prescribed medicines to control cholesterol as directed by your health care provider.  Manage your diabetes. ? Controlling your carbohydrate and sugar intake is recommended to manage diabetes. ? Take any prescribed medicines to control diabetes as directed by  your health care provider.  Control your hypertension. ? Making food choices that are low in salt (sodium), saturated fat, trans fat, and cholesterol is recommended to manage hypertension. ? Ask your health care provider if you need treatment to lower your blood pressure. Take any prescribed medicines to control hypertension as directed by your health care provider. ? If you are 30-39 years of age, have your blood pressure checked every 3-5 years. If you are 57 years of age or older, have your blood pressure checked every year.  Maintain a healthy weight. ? Reducing calorie intake and making food choices that are low in sodium, saturated fat, trans fat, and cholesterol are recommended to manage weight.  Stop drug abuse.  Avoid taking birth control pills. ? Talk to your health care provider about the risks of taking birth control pills if you are over 34 years old, smoke, get migraines, or have ever had a blood clot.  Get evaluated for sleep disorders (sleep apnea). ? Talk to your health care provider about getting a sleep evaluation if you snore a lot or have excessive sleepiness.  Take medicines only as directed by your health care provider. ? For some people, aspirin or blood thinners (anticoagulants) are helpful in reducing the risk of forming abnormal blood clots that can lead to stroke. If you have the irregular heart rhythm of atrial fibrillation, you should be on a blood thinner unless there is a good reason you cannot take them. ? Understand all your medicine instructions.  Make sure that other conditions (such as anemia or atherosclerosis) are addressed. Get help right away if:  You have sudden weakness or numbness of the  face, arm, or leg, especially on one side of the body.  Your face or eyelid droops to one side.  You have sudden confusion.  You have trouble speaking (aphasia) or understanding.  You have sudden trouble seeing in one or both eyes.  You have sudden  trouble walking.  You have dizziness.  You have a loss of balance or coordination.  You have a sudden, severe headache with no known cause.  You have new chest pain or an irregular heartbeat. Any of these symptoms may represent a serious problem that is an emergency. Do not wait to see if the symptoms will go away. Get medical help at once. Call your local emergency services (911 in [place] Do not drive yourself to the hospital. This information is not intended to replace advice given to you by your health care provider. Make sure you discuss any questions you have with your health care provider. Document Released: 01/14/2005 Document Revised: 05/14/2016 Document Reviewed: 06/09/2013 Elsevier Interactive Patient Education  2017 [employer].

## 2017-12-06 NOTE — Progress Notes (Signed)
[facility] [address] [place]. [place] [address] [phone]       OFFICE FOLLOW-UP NOTE  Mr. CHEZ BULNES Date of Birth:  1935/02/03 Medical Record Number:  [id number]   HPI: Mr Mcwhirter is a 82 year old male seen today for the first office follow-up visit following hospital admissions for posterior circulation TIA in September 2018 and right thalamic infarct in October 2018.  History is obtained from the patient, review of electronic medical records and I have personally reviewed imaging films. Thane Age Oxfordis a 82 y.o.malewas a past medical history of hypertension, coronary artery disease status post CABG, COPDon home oxygen, and hypothyroidism, who was in his usual state of health until about 5:30 PM when he had sudden onset ofunresponsiveness witnessed by his girlfriend.They were cooking at home and he suddenly became unresponsive. She called 911 immediately. The patient was evaluated by EMS and found to have right-sided weakness and was not answering questions appropriately.Activated large vessel occlusion code stroke and brought the patient to the ER.In the emergency room, at the bridge on first examination, the patient was lethargic, dysarthric, poor attention concentration and nonfocal in terms of weakness.He was taken in for a stat noncontrast CT of the head, that revealed a heavily calcified right vertebral artery and basilar artery on the noncontrast CT of the head, and also had a suspicious looking finding which was concerning for thrombus on the distal basilar.This was confirmed with the neuroradiologist on call who also agreed with the finding of possible thrombus in the setting of a heavily calcified basilar.Repeat exam of the patient showed disconjugate gaze, inability to cross midline to the right and worsening dysarthria.He was also at that time weak on the right upper extremity.At that time, decision was made to use IV TPA.A CT angiogram of the head was  done, which showed a heavily calcified right vertebral artery, stenotic basilar with probable distal occlusion, and left P2 occlusion.Left vertebral artery was nondominant and ended in a PICA.As these imaging studies were done, we also ordered a stat MRI brain to evaluate for any evidence of infarct.Neuro interventionist, Dr. Estanislado Pandy, was also contacted and reviewed the images. MRI of the brain did not reveal any evidence of acute ischemia in the brainstem. The patient's exam also started to improve,with him becoming more awake, following all commands, speech becoming less dysarthric. He still did have mild right facial droop and mild right upper extremity weakness. His gaze had no restriction and extraocular movements became intact.  EKG showed atrial fibrillation with a slow ventricular rate.  Cerebral catheter angiogram showed right vertebral artery origin acute occlusion with partial distal reconstitution.  Right ICA showed 75% stenosis.  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  2D echo showed normal ejection fraction LDL cholesterol was 46 mg percent and hemoglobin A1c was 5.4.  Patient was started on aspirin 81 mg and Eliquis 2.5 mg twice daily.  Cardiology was consulted.  Patient's bradycardia was presumed to be AV block and resolved after beta-blockers were discontinued.  Patient was discharged home but returned on 10/03/17 with symptoms of sudden onset of dizziness lightheadedness and left-sided weakness.  He was on Eliquis hence not a candidate for anticoagulation but MRI scan  showed a right thalamic infarct.  Stroke of workup was not repeated since he had had a recent workup a few weeks ago.   Since the present episode occurred while on Eliquis for a few weeks alternatives like changing over to Pradaxa or Xarelto were discussed but due to lack of definite clinical data showing benefit over Eliquis patient decided to stay on Eliquis.  Patient states is done well since discharge.  He had persistent bradycardia and underwent pacemaker insertion on 10/11/17 which went well but he does complain of some tightness in his left arm, chest and shoulder following pacemaker insertion.  He is tolerating Eliquis well without bleeding but does bruise easily.  His blood pressure is well controlled today it is 1 2 9/7 5.  Is tolerating Crestor and Zetia without muscle aches or pains.  He does take some Lyrica for peripheral neuropathy.  He has no new complaints.    ROS:   14 system review of systems is positive for hearing loss, ringing in the ears, wheezing, joint pain, walking difficulty, skin wounds, left chest and arm tightness and all other systems negative.  PMH:  Past Medical History:  Diagnosis Date  . AAA (abdominal aortic aneurysm) ([place])   . Arthritis   . COPD (chronic obstructive pulmonary disease) ([place])   . Coronary artery disease   . Hypertension   . Hypothyroidism   . Myocardial infarction ([place])   . Shortness of breath    on exertion;  has productive cough at times  . Sleep apnea    states mild . no cpap  . Stroke [facility])     Social History:  Social History   Socioeconomic History  . Marital status: Divorced    Spouse name: Not on file  . Number of children: Not on file  . Years of education: 12+  . Highest education level: Not on file  Social Needs  . Financial resource strain: Not on file  . Food insecurity - worry: Not on file  . Food insecurity - inability: Not on file  . Transportation needs - medical: Not on file  . Transportation needs - non-medical: Not on file  Occupational History  . Occupation: Retired   Tobacco Use  . Smoking status: Former Smoker     Packs/day: 0.75    Years: 51.00    Pack years: 38.25    Types: Cigarettes    Last attempt to quit: 02/23/2000    Years since quitting: 17.7  . Smokeless tobacco: Never Used  Substance and Sexual Activity  . Alcohol use: Yes    Alcohol/week: 0.0 - 0.6 oz    Comment: Moderate to little  . Drug use: No  . Sexual activity: Not on file  Other Topics Concern  . Not on file  Social History Narrative   Patient lives at home alone    Patient is retired    Patient has 3 years of college     Medications:   Current Outpatient Medications on File Prior to Visit  Medication Sig Dispense Refill  . apixaban (ELIQUIS) 2.5 MG TABS tablet Take 1 tablet (2.5 mg total) by mouth 2 (two) times daily. Hold for 2 days, restart on 10/06/17 AM 180 tablet 3  . aspirin EC 81 MG tablet Take 81 mg by mouth daily after supper.    . Biotin 5000 MCG TABS Take 5,000 mcg by mouth daily.    . budesonide-formoterol (SYMBICORT) 160-4.5 MCG/ACT inhaler Inhale 2 puffs into the lungs 2 (two) times  daily. 3 Inhaler 3  . Choline Fenofibrate (TRILIPIX) 135 MG capsule Take 135 mg by mouth daily after supper.     . ezetimibe (ZETIA) 10 MG tablet Take 10 mg by mouth daily after supper.     . Febuxostat (ULORIC) 80 MG TABS Take 80 mg by mouth daily after supper.    . furosemide (LASIX) 20 MG tablet Take 1 tablet (20 mg total) by mouth every other day.    Marland Kitchen GLUCOSAMINE-CHONDROITIN PO Take 1 tablet by mouth 2 (two) times daily. 1500-1200    . levothyroxine (SYNTHROID, LEVOTHROID) 88 MCG tablet Take 88 mcg by mouth daily before breakfast.    . Magnesium 250 MG TABS Take 250 mg by mouth daily.    . Multiple Vitamin (MULTIVITAMIN WITH MINERALS) TABS tablet Take 1 tablet by mouth daily.    . Omega-3 Fatty Acids (FISH OIL) 1200 MG CAPS Take 2,400 mg by mouth 2 (two) times daily.    . OXYGEN Inhale 2 L into the lungs as needed (for  shortness of breath with exertion only).     . pregabalin (LYRICA) 50 MG capsule Take 50 mg by mouth 2  (two) times daily.    . rosuvastatin (CRESTOR) 20 MG tablet Take 20 mg by mouth daily after supper.     . terazosin (HYTRIN) 5 MG capsule Take 10 mg by mouth 2 (two) times daily.    Marland Kitchen tiotropium (SPIRIVA) 18 MCG inhalation capsule Take 18 mcg by mouth daily.    . vitamin B-12 (CYANOCOBALAMIN) 1000 MCG tablet Take 1,000 mcg by mouth daily.     No current facility-administered medications on file prior to visit.     Allergies:   Allergies  Allergen Reactions  . Other Hives and Dermatitis    -mycins  . Sulfa Antibiotics Rash  . Latex Rash  . Tape Rash    Reaction to adhesive tape - pls use paper tape    Physical Exam General: well developed, well nourished elderly male cholesterol, seated, in no evident distress Head: head normocephalic and atraumatic.  Neck: supple with no carotid or supraclavicular bruits Cardiovascular: regular rate and rhythm, no murmurs Musculoskeletal: no deformity Skin:  no rash/petichiae Vascular:  Normal pulses all extremities Vitals:   12/06/17 1149  BP: 129/75  Pulse: 66   Neurologic Exam Mental Status: Awake and fully alert. Oriented to place and time. Recent and remote memory intact. Attention span, concentration and fund of knowledge appropriate. Mood and affect appropriate.  Cranial Nerves: Fundoscopic exam reveals sharp disc margins. Pupils equal, briskly reactive to light. Extraocular movements full without nystagmus. Visual fields full to confrontation. Hearing intact. Facial sensation intact. Face, tongue, palate moves normally and symmetrically.  Motor: Normal bulk and tone. Normal strength in all tested extremity muscles. Sensory.: intact to touch ,pinprick .position and vibratory sensation.  Coordination: Rapid alternating movements normal in all extremities. Finger-to-nose and heel-to-shin performed accurately bilaterally. Gait and Station: Arises from chair without difficulty. Stance is normal. Gait demonstrates normal stride length and  balance . Able to heel, toe and tandem walk with  difficulty.  Reflexes: 1+ and symmetric. Toes downgoing.   NIHSS  0 Modified Rankin  1   ASSESSMENT: 82 year male with right thalamic infarct in October 8676 of embolic etiology due to atrial fibrillation while on Eliquis with residual mild left hemibody paresthesias.  Recent prior history of posterior circulation TIA in September 2018 in treated with iv  TPA with full recovery    PLAN: I had  a long d/w patient about his recent embolic strokes,atrial fibrillation, risk for recurrent stroke/TIAs, personally independently reviewed imaging studies and stroke evaluation results and answered questions.Continue Eliquis (apixaban) daily  for secondary stroke prevention and maintain strict control of hypertension with blood pressure goal below 130/90, diabetes with hemoglobin A1c goal below 6.5% and lipids with LDL cholesterol goal below 70 mg/dL. I also advised the patient to eat a healthy diet with plenty of whole grains, cereals, fruits and vegetables, exercise regularly and maintain ideal body weight Followup in the future with my nurse practitioner Janett Billow in 6 months or call earlier if necessary Greater than 50% of time during this 35 minute visit was spent on counseling,explanation of diagnosis of embolic strokes, atrial fibrillation planning of further management, discussion with patient and family and coordination of care Antony Contras, MD  [facility] [address] [place], [place] [address]  Phone [phone] Fax [phone] Note: This document was prepared with digital dictation and possible smart phrase technology. Any transcriptional errors that result from this process are unintentional

## 2017-12-07 ENCOUNTER — Ambulatory Visit (HOSPITAL_COMMUNITY)
Admission: RE | Admit: 2017-12-07 | Discharge: 2017-12-07 | Disposition: A | Discharge status: Home / Self Care | Payer: Medicare Other | Source: Ambulatory Visit | Attending: Interventional Radiology | Admitting: Interventional Radiology

## 2017-12-07 DIAGNOSIS — I671 Cerebral aneurysm, nonruptured: Secondary | ICD-10-CM | POA: Diagnosis not present

## 2017-12-07 DIAGNOSIS — I729 Aneurysm of unspecified site: Secondary | ICD-10-CM

## 2017-12-07 DIAGNOSIS — I6523 Occlusion and stenosis of bilateral carotid arteries: Secondary | ICD-10-CM | POA: Diagnosis not present

## 2017-12-07 DIAGNOSIS — I771 Stricture of artery: Secondary | ICD-10-CM

## 2017-12-07 HISTORY — PX: IR RADIOLOGIST EVAL & MGMT: IMG5224

## 2017-12-08 ENCOUNTER — Encounter (HOSPITAL_COMMUNITY): Payer: Self-pay | Admitting: Interventional Radiology

## 2017-12-08 ENCOUNTER — Telehealth (HOSPITAL_COMMUNITY): Payer: Self-pay

## 2017-12-08 NOTE — Telephone Encounter (Signed)
Returned phone call to pt's fiance, no answer, no vm. AW

## 2017-12-13 NOTE — Addendum Note (Signed)
Addended by: Lianne Cure A on: 12/13/2017 10:56 AM   Modules accepted: Orders

## 2017-12-24 ENCOUNTER — Other Ambulatory Visit: Payer: Self-pay

## 2017-12-27 ENCOUNTER — Other Ambulatory Visit: Payer: Self-pay

## 2017-12-27 NOTE — Patient Outreach (Signed)
Telephone outreach to patient to obtain mRS was successfully completed. mRS = 1 

## 2017-12-28 ENCOUNTER — Other Ambulatory Visit (HOSPITAL_COMMUNITY): Payer: Self-pay | Admitting: Interventional Radiology

## 2017-12-28 DIAGNOSIS — I63239 Cerebral infarction due to unspecified occlusion or stenosis of unspecified carotid arteries: Secondary | ICD-10-CM

## 2017-12-28 DIAGNOSIS — I6521 Occlusion and stenosis of right carotid artery: Secondary | ICD-10-CM

## 2017-12-30 ENCOUNTER — Telehealth (HOSPITAL_COMMUNITY): Payer: Self-pay | Admitting: *Deleted

## 2017-12-30 NOTE — Telephone Encounter (Signed)
Called and LM for Plavix 75mg  daily #30 with 3 refills.  This was doen per Dr. Arlean Hopping reguest

## 2017-12-30 NOTE — Telephone Encounter (Signed)
Called and spoke with Patient and wife.  Per Dr. Estanislado Pandy patient is to start Plavix 75mg  daily on 01/03/18.  On 01/08/18 pt is to stop taking Eliqus.  Verified pharmacy with patient.  Informed patient written instructions are in the mail to him.  He is to be NPO after midnight for surgery.  He needs to be here at [place] on 01/10/18

## 2018-01-03 ENCOUNTER — Other Ambulatory Visit: Payer: Self-pay | Admitting: Radiology

## 2018-01-04 ENCOUNTER — Ambulatory Visit (INDEPENDENT_AMBULATORY_CARE_PROVIDER_SITE_OTHER): Payer: Self-pay | Admitting: *Deleted

## 2018-01-04 DIAGNOSIS — I4430 Unspecified atrioventricular block: Secondary | ICD-10-CM

## 2018-01-04 NOTE — Pre-Procedure Instructions (Addendum)
BINNIE VONDERHAAR  01/04/2018      [place], [place] [place] [address] Phone: [phone] Fax: [phone]    Your procedure is scheduled on MON. Jan.21  Report to [facility] Admitting at 6:30 A.M.  Call this number if you have problems the morning of surgery:  [phone]   Remember:  Do not eat food or drink liquids after midnight on Sun. Jan. 20   Take these medicines the morning of surgery with A SIP OF WATER :tylenol if needed, symbicort,levothyroxine (synthroid), lyrica, terazosin (hytrin), spiriva             CONTINUE ASPIRIN AND PLAVIX PER DR. Estanislado Pandy   7 days prior to surgery STOP taking  Aleve, Naproxen, Ibuprofen, Motrin, Advil, Goody's, BC's, all herbal medications, fish oil, and all vitamins    Do not wear jewelry.  Do not wear lotions, powders, or perfumes, or deodorant.  Do not shave 48 hours prior to surgery.  Men may shave face and neck.  Do not bring valuables to the hospital.  [facility] is not responsible for any belongings or valuables.  Contacts, dentures or bridgework may not be worn into surgery.  Leave your suitcase in the car.  After surgery it may be brought to your room.  For patients admitted to the hospital, discharge time will be determined by your treatment team.  Patients discharged the day of surgery will not be allowed to drive home.   Special instructions:   [place]- Preparing For Surgery  Before surgery, you can play an important role. Because skin is not sterile, your skin needs to be as free of germs as possible. You can reduce the number of germs on your skin by washing with CHG (chlorahexidine gluconate) Soap before surgery.  CHG is an antiseptic cleaner which kills germs and bonds with the skin to continue killing germs even after washing.  Please do not use if you have an allergy to CHG or antibacterial soaps. If your skin becomes  reddened/irritated stop using the CHG.  Do not shave (including legs and underarms) for at least 48 hours prior to first CHG shower. It is OK to shave your face.  Please follow these instructions carefully.   1. Shower the NIGHT BEFORE SURGERY and the MORNING OF SURGERY with CHG.   2. If you chose to wash your hair, wash your hair first as usual with your normal shampoo.  3. After you shampoo, rinse your hair and body thoroughly to remove the shampoo.  4. Use CHG as you would any other liquid soap. You can apply CHG directly to the skin and wash gently with a scrungie or a clean washcloth.   5. Apply the CHG Soap to your body ONLY FROM THE NECK DOWN.  Do not use on open wounds or open sores. Avoid contact with your eyes, ears, mouth and genitals (private parts). Wash Face and genitals (private parts)  with your normal soap.  6. Wash thoroughly, paying special attention to the area where your surgery will be performed.  7. Thoroughly rinse your body with warm water from the neck down.  8. DO NOT shower/wash with your normal soap after using and rinsing off the CHG Soap.  9. Pat yourself dry with a CLEAN TOWEL.  10. Wear CLEAN PAJAMAS to bed the night before surgery, wear comfortable clothes the morning of surgery  11. Place CLEAN  SHEETS on your bed the night of your first shower and DO NOT SLEEP WITH PETS.    Day of Surgery: Do not apply any deodorants/lotions. Please wear clean clothes to the hospital/surgery center.      Please read over the following fact sheets that you were given. Coughing and Deep Breathing and Surgical Site Infection Prevention

## 2018-01-05 ENCOUNTER — Inpatient Hospital Stay (HOSPITAL_COMMUNITY): Payer: Medicare Other | Admitting: Vascular Surgery

## 2018-01-05 ENCOUNTER — Encounter (HOSPITAL_COMMUNITY): Payer: Self-pay

## 2018-01-05 ENCOUNTER — Encounter (HOSPITAL_COMMUNITY)
Admission: RE | Admit: 2018-01-05 | Discharge: 2018-01-05 | Disposition: A | Discharge status: Home / Self Care | Payer: Medicare Other | Source: Ambulatory Visit | Attending: Interventional Radiology | Admitting: Interventional Radiology

## 2018-01-05 ENCOUNTER — Inpatient Hospital Stay (HOSPITAL_COMMUNITY): Payer: Medicare Other | Admitting: Certified Registered Nurse Anesthetist

## 2018-01-05 ENCOUNTER — Other Ambulatory Visit: Payer: Self-pay

## 2018-01-05 DIAGNOSIS — Z7982 Long term (current) use of aspirin: Secondary | ICD-10-CM | POA: Insufficient documentation

## 2018-01-05 DIAGNOSIS — Z01810 Encounter for preprocedural cardiovascular examination: Secondary | ICD-10-CM | POA: Diagnosis not present

## 2018-01-05 DIAGNOSIS — Z87891 Personal history of nicotine dependence: Secondary | ICD-10-CM | POA: Insufficient documentation

## 2018-01-05 DIAGNOSIS — Z951 Presence of aortocoronary bypass graft: Secondary | ICD-10-CM | POA: Diagnosis not present

## 2018-01-05 DIAGNOSIS — Z8673 Personal history of transient ischemic attack (TIA), and cerebral infarction without residual deficits: Secondary | ICD-10-CM | POA: Insufficient documentation

## 2018-01-05 DIAGNOSIS — N183 Chronic kidney disease, stage 3 (moderate): Secondary | ICD-10-CM | POA: Diagnosis not present

## 2018-01-05 DIAGNOSIS — Z950 Presence of cardiac pacemaker: Secondary | ICD-10-CM | POA: Insufficient documentation

## 2018-01-05 DIAGNOSIS — Z01812 Encounter for preprocedural laboratory examination: Secondary | ICD-10-CM | POA: Diagnosis not present

## 2018-01-05 DIAGNOSIS — I6523 Occlusion and stenosis of bilateral carotid arteries: Secondary | ICD-10-CM | POA: Insufficient documentation

## 2018-01-05 DIAGNOSIS — I4891 Unspecified atrial fibrillation: Secondary | ICD-10-CM | POA: Diagnosis not present

## 2018-01-05 DIAGNOSIS — J449 Chronic obstructive pulmonary disease, unspecified: Secondary | ICD-10-CM | POA: Insufficient documentation

## 2018-01-05 DIAGNOSIS — I2510 Atherosclerotic heart disease of native coronary artery without angina pectoris: Secondary | ICD-10-CM | POA: Diagnosis not present

## 2018-01-05 DIAGNOSIS — Z79899 Other long term (current) drug therapy: Secondary | ICD-10-CM | POA: Insufficient documentation

## 2018-01-05 DIAGNOSIS — Z7901 Long term (current) use of anticoagulants: Secondary | ICD-10-CM | POA: Insufficient documentation

## 2018-01-05 DIAGNOSIS — G4733 Obstructive sleep apnea (adult) (pediatric): Secondary | ICD-10-CM | POA: Diagnosis not present

## 2018-01-05 DIAGNOSIS — I714 Abdominal aortic aneurysm, without rupture: Secondary | ICD-10-CM | POA: Diagnosis not present

## 2018-01-05 DIAGNOSIS — I129 Hypertensive chronic kidney disease with stage 1 through stage 4 chronic kidney disease, or unspecified chronic kidney disease: Secondary | ICD-10-CM | POA: Diagnosis not present

## 2018-01-05 HISTORY — DX: Headache, unspecified: R51.9

## 2018-01-05 HISTORY — DX: Presence of cardiac pacemaker: Z95.0

## 2018-01-05 HISTORY — DX: Headache: R51

## 2018-01-05 HISTORY — DX: Chronic kidney disease, unspecified: N18.9

## 2018-01-05 LAB — COMPREHENSIVE METABOLIC PANEL
ALBUMIN: 3.9 g/dL (ref 3.5–5.0)
ALK PHOS: 29 U/L — AB (ref 38–126)
ALT: 21 U/L (ref 17–63)
AST: 33 U/L (ref 15–41)
Anion gap: 10 (ref 5–15)
BILIRUBIN TOTAL: 0.9 mg/dL (ref 0.3–1.2)
BUN: 62 mg/dL — AB (ref 6–20)
CO2: 26 mmol/L (ref 22–32)
Calcium: 9.7 mg/dL (ref 8.9–10.3)
Chloride: 107 mmol/L (ref 101–111)
Creatinine, Ser: 2.69 mg/dL — ABNORMAL HIGH (ref 0.61–1.24)
GFR calc Af Amer: 24 mL/min — ABNORMAL LOW (ref >60)
GFR calc non Af Amer: 21 mL/min — ABNORMAL LOW (ref >60)
GLUCOSE: 102 mg/dL — AB (ref 65–99)
POTASSIUM: 4.3 mmol/L (ref 3.5–5.1)
SODIUM: 143 mmol/L (ref 135–145)
TOTAL PROTEIN: 6.6 g/dL (ref 6.5–8.1)

## 2018-01-05 LAB — CBC WITH DIFFERENTIAL/PLATELET
BASOS ABS: 0.1 10*3/uL (ref 0.0–0.1)
Basophils Relative: 2 %
EOS ABS: 0.3 10*3/uL (ref 0.0–0.7)
Eosinophils Relative: 6 %
HEMATOCRIT: 41.3 % (ref 39.0–52.0)
HEMOGLOBIN: 13.3 g/dL (ref 13.0–17.0)
LYMPHS PCT: 31 %
Lymphs Abs: 1.5 10*3/uL (ref 0.7–4.0)
MCH: 30.6 pg (ref 26.0–34.0)
MCHC: 32.2 g/dL (ref 30.0–36.0)
MCV: 94.9 fL (ref 78.0–100.0)
MONOS PCT: 9 %
Monocytes Absolute: 0.4 10*3/uL (ref 0.1–1.0)
Neutro Abs: 2.6 10*3/uL (ref 1.7–7.7)
Neutrophils Relative %: 52 %
Platelets: 118 10*3/uL — ABNORMAL LOW (ref 150–400)
RBC: 4.35 MIL/uL (ref 4.22–5.81)
RDW: 14.6 % (ref 11.5–15.5)
WBC: 4.9 10*3/uL (ref 4.0–10.5)

## 2018-01-05 LAB — PLATELET INHIBITION P2Y12: Platelet Function  P2Y12: 258 [PRU] (ref 194–418)

## 2018-01-05 LAB — APTT: APTT: 33 s (ref 24–36)

## 2018-01-05 LAB — PROTIME-INR
INR: 1.15
Prothrombin Time: 14.6 seconds (ref 11.4–15.2)

## 2018-01-05 NOTE — Progress Notes (Signed)
Remote pacemaker transmission.   

## 2018-01-05 NOTE — Progress Notes (Addendum)
PCP:Dr.Ramachandran, used to see Dr. Wilson Singer now retired. Cardiologist: Dr. Daneen Schick Pulmologist: Dr. Melvyn Novas Neurologist: Dr. Leonie Man  Pt. States last dose of Eliquis 01-07-18 Started plavix 01-03-18 Pacemaker-medtronic  Pt. Reported since pacemaker was inserted his left shoulder,arm, and back "feels more thick and feels numb", "sometimes has tingling in the left hand" Randa Lynn notified. Notes from Dr. Tamala Julian being aware of this.  Discussed with Randa Lynn of pt's last ekg done Oct. 2018. Since there has not been another egk since pacemaker insertion, she stated to obtain one today.

## 2018-01-06 ENCOUNTER — Other Ambulatory Visit: Payer: Self-pay | Admitting: General Surgery

## 2018-01-06 ENCOUNTER — Other Ambulatory Visit: Payer: Self-pay | Admitting: Radiology

## 2018-01-06 LAB — CUP PACEART REMOTE DEVICE CHECK
Battery Remaining Longevity: 115 mo
Brady Statistic AP VP Percent: 0 %
Brady Statistic AP VS Percent: 0 %
Brady Statistic AS VP Percent: 0 %
Brady Statistic RA Percent Paced: 0.01 %
Brady Statistic RV Percent Paced: 99.41 %
Date Time Interrogation Session: 20190115041114
Implantable Lead Implant Date: 20181015
Implantable Lead Implant Date: 20181015
Implantable Lead Location: 753860
Implantable Lead Model: 5076
Lead Channel Impedance Value: 380 Ohm
Lead Channel Impedance Value: 399 Ohm
Lead Channel Impedance Value: 494 Ohm
Lead Channel Sensing Intrinsic Amplitude: 0.625 mV
Lead Channel Sensing Intrinsic Amplitude: 14.75 mV
Lead Channel Sensing Intrinsic Amplitude: 14.75 mV
Lead Channel Setting Pacing Amplitude: 3.5 V
Lead Channel Setting Sensing Sensitivity: 1.2 mV
MDC IDC LEAD LOCATION: 753859
MDC IDC MSMT BATTERY VOLTAGE: 3.15 V
MDC IDC MSMT LEADCHNL RA IMPEDANCE VALUE: 266 Ohm
MDC IDC MSMT LEADCHNL RA SENSING INTR AMPL: 0.625 mV
MDC IDC MSMT LEADCHNL RV PACING THRESHOLD AMPLITUDE: 0.625 V
MDC IDC MSMT LEADCHNL RV PACING THRESHOLD PULSEWIDTH: 0.4 ms
MDC IDC PG IMPLANT DT: 20181015
MDC IDC SET LEADCHNL RV PACING AMPLITUDE: 3.5 V
MDC IDC SET LEADCHNL RV PACING PULSEWIDTH: 0.4 ms
MDC IDC STAT BRADY AS VS PERCENT: 0 %

## 2018-01-06 NOTE — Progress Notes (Signed)
Anesthesia Chart Review: Patient is a 83 year old male scheduled for cerebral arteriogram with possible right carotid stent and angioplasty on 01/10/18 by Dr. Luanne Bras.  History includes former smoker (quit '01), CVA/TIA (posterior TIA d/t right VA occlusion, embolism of BA and left PCA 09/11/17 s/p TPA; small acute lacunar infarct 10/03/17), afib with bradycardia (s/p Medtronic PPM 10/04/17), MI/CAD (s/p CABG LIMA-LAD, SVG-D3, SVG-OM2-3-LPDA 10/26/06), carotid occlusive disease (75% RICA, 64% LICA 02/3294 CTA), HTN, AAA (4.3 cm by 10/2017 U/S), CKD stage III (due to arterionephrosclerosis in the setting of chronic NSAID use), COPD Gold II (O2 2L PRN sats < 90%), OSA ("mild", no CPAP), exertional dyspnea, hypothyroidism, arthritis, left knee arthroscopy 02/26/12, appendectomy '63.  - PCP is Dr. Merrilee Seashore. Previously saw Dr.Kohut (now retired). - Cardiologist is Dr. Daneen Schick (re-established 08/2017 after three years). Last visit 11/25/17. Patient did report left arm axillary discomfort. Feeling of being swollen, but overall felt stable from a cardiac standpoint. Patient was encouraged to follow-up with Dr. Caryl Comes regarding his arm and PPM. Six month cardiology follow-up recommended. - EP cardiologist is Dr. Virl Axe. Had wound and device check with RN 10/18/17. Remote PPM transmission 01/04/18. - Neurologist is Dr. Antony Contras. Last visti 12/06/17. - Nephrologist is Dr. Erling Cruz at [facility]). Last visit 12/02/17. BUN/Cr 46/2.28, eGFR 20 at that time. Six month follow-up planned. - Pulmonologist is Dr. Christinia Gully. Last visit 10/28/17.  - Vascular surgeon is Dr. Ruta Hinds. Last visit 11/18/17 with Clemon Chambers, NP.  Meds include Eliquis, aspirin 81 mg, Symbicort, Plavix, Zetia, Lorcet, Lasix, levothyroxine, magnesium, fish oil, Lyrica, Crestor, terazosin, Spiriva, turmeric. Per Anderson Malta at Dr. Arlean Hopping office, Dr. Estanislado Pandy has discussed starting Plavix  01/03/18 and holding Eliquis (after 01/07/18) with Dr. Leonie Man.  BP 134/66   Pulse 66   Temp 36.8 C (Oral)   Resp 19   Ht _0  (1.753 m)   Wt 194 lb 8 oz (88.2 kg)   SpO2 97%   BMI 28.72 kg/m   EKG 01/05/18: Ventricular paced rhythm.   Echo 09/13/17: Study Conclusions - Left ventricle: The cavity size was normal. Wall thickness was   increased in a pattern of mild LVH. Indeterminant diastolic   function (atrial fibrillation). The estimated ejection fraction   was 55%. Wall motion was normal; there were no regional wall   motion abnormalities. - Ventricular septum: Mildly D-shaped interventricular septum   suggesting RV pressure/volume overload. - Aortic valve: There was no stenosis. - Aorta: Mildly dilated aortic root. Aortic root dimension: 38 mm   (ED). - Mitral valve: Mildly calcified annulus. There was trivial   regurgitation. - Left atrium: The atrium was moderately dilated. - Right ventricle: The cavity size was mildly dilated. Systolic   function was normal. - Right atrium: The atrium was moderately dilated. - Pulmonary arteries: PA peak pressure: 34 mm Hg (S). - Systemic veins: IVC measured 2.5 cm with < 50% respirophasic   variation, suggesting RA pressure 15 mmHg. Impressions: - The patient was in atrial fibrillation. Normal LV size with mild   LV hypertrophy. EF 55%. Mildly dilated RV with normal systolic   function. Mildly D-shaped interventricular septum suggesting RV   pressure/volume overload. Dilated IVC suggests elevated RV   filling pressure.  Last cardiac cath was pre-CABG on 10/22/06. Last ETT test seen was in 2008.   Abdominal aorta Duplex 11/18/17: Impression: Aneurysmal dilatation of the mid abdominal aorta with maximum diameter of 4.3 cm, with mural thrombus noted. No  significant change in the AAA when compared to previous exam on 11/05/16.   Bilateral CCA and innominate angiography 09/12/17: IMPRESSION: - Angiographically occluded right vertebral  artery just distal to its origin, with retrograde opacification of the right dominant vertebral artery retrogradely from external carotid artery branches from the ipsilateral occipital artery, and branches of the ascending cervical branch of the thyrocervical trunk. - High-grade stenosis of the right vertebrobasilar junction with slow antegrade flow into the distal basilar artery. - Poor filling distal to the right posterior cerebral artery P2 P3 segment, with arteriosclerotic changes involving the left posterior cerebral artery. - Patency of the right posterior communicating artery partially supplying the right posterior cerebral artery. - Approximately 75% stenosis of the right internal carotid artery proximally by the NASCET criteria. - Approximately 30% stenosis of the left internal carotid artery proximally associated with a small ulceration. - Approximately 50% stenosis of the right internal carotid artery supraclinoid segment. - Occlusion versus high-grade stenosis of the right anterior cerebral artery at the A1 A2 junction. 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  CTA head/neck 09/11/17: IMPRESSION: - 70% diameter stenosis right internal carotid artery due to heavily calcified plaque. Moderate stenosis right cavernous carotid. - 50% diameter stenosis left internal carotid artery due to heavily calcified plaque. Moderate stenosis left cavernous carotid - Dominant right vertebral artery which is severely diseased and heavily calcified. The right vertebral artery is occluded in the proximal and midportion with faint reconstitution distally. The basilar is diffusely diseased. There is acute thrombus in the distal basilar. Left posterior cerebral artery is patent with thrombus overlying the left PCA origin. There is occlusion of the right  P2 segment due to clot. Left vertebral artery is small and ends in PICA - Abnormal CT perfusion with multiple areas of delayed perfusion due to severe atherosclerotic disease. No infarct on CT perfusion.   CXR 10/05/17: IMPRESSION: 1. Left-sided pacing device as above.  Negative for a pneumothorax 2. Tiny pleural effusions.  Streaky atelectasis or scar at the bases 3. Borderline cardiomegaly 4. Prior granulomatous disease  PFTs 04/17/16: FVC 3.27 (83%), FEV1 1.92 (69%), DLCO unc 11.82 (38%).  Preoperative labs noted. Glucose 102. BUN 62, Cr 2.69, eGFR 21. (Comparison labs BUN/Cr 46/2.28, eGFR 20 (at [facility]); 50/2.52, eGFR 23 10/03/17; 41/2.17, eGFR 28 09/23/17; 26/1.67, eGFR 38 09/17/17). P2y12 258 (just started Plavix). Repeat P2y12 order entered by IR.   Patient with recent TIA and CVA. Has had PPM inserted since then. Patient with recent cardiology follow-up. PPM device RX still pending from Dr. Caryl Comes. Per IR, neurology on board with procedural plans and Plavix/Eliquis instructions. His renal function is up since 08/2017, but was sent back to nephrology and saw Dr. Florene Glen last month. eGFR 20 which is similar to his pre-operative labs. Discussed with anesthesiologist Dr. Andres Shad. No additional recommendations at this time. Anesthesiologist to evaluate on the day of his procedure.  [facility]/Anesthesiology Phone [phone] 01/06/2018 2:58 PM

## 2018-01-07 ENCOUNTER — Encounter: Payer: Self-pay | Admitting: Cardiology

## 2018-01-10 ENCOUNTER — Encounter (HOSPITAL_COMMUNITY)
Admission: RE | Disposition: A | Discharge status: Home / Self Care | Payer: Self-pay | Source: Ambulatory Visit | Attending: Interventional Radiology

## 2018-01-10 ENCOUNTER — Ambulatory Visit (HOSPITAL_COMMUNITY)
Admission: RE | Admit: 2018-01-10 | Discharge: 2018-01-10 | Disposition: A | Discharge status: Home / Self Care | Payer: Medicare Other | Source: Ambulatory Visit | Attending: Interventional Radiology | Admitting: Interventional Radiology

## 2018-01-10 ENCOUNTER — Telehealth: Payer: Self-pay | Admitting: *Deleted

## 2018-01-10 ENCOUNTER — Encounter (HOSPITAL_COMMUNITY): Payer: Self-pay | Admitting: *Deleted

## 2018-01-10 ENCOUNTER — Encounter (HOSPITAL_COMMUNITY): Payer: Self-pay

## 2018-01-10 ENCOUNTER — Ambulatory Visit (HOSPITAL_COMMUNITY): Admission: RE | Admit: 2018-01-10 | Payer: Medicare Other | Source: Ambulatory Visit

## 2018-01-10 DIAGNOSIS — I2510 Atherosclerotic heart disease of native coronary artery without angina pectoris: Secondary | ICD-10-CM | POA: Diagnosis not present

## 2018-01-10 DIAGNOSIS — G4730 Sleep apnea, unspecified: Secondary | ICD-10-CM | POA: Insufficient documentation

## 2018-01-10 DIAGNOSIS — Z950 Presence of cardiac pacemaker: Secondary | ICD-10-CM | POA: Insufficient documentation

## 2018-01-10 DIAGNOSIS — Z5309 Procedure and treatment not carried out because of other contraindication: Secondary | ICD-10-CM | POA: Diagnosis not present

## 2018-01-10 DIAGNOSIS — I10 Essential (primary) hypertension: Secondary | ICD-10-CM | POA: Diagnosis not present

## 2018-01-10 DIAGNOSIS — I6521 Occlusion and stenosis of right carotid artery: Secondary | ICD-10-CM | POA: Diagnosis not present

## 2018-01-10 DIAGNOSIS — I252 Old myocardial infarction: Secondary | ICD-10-CM | POA: Insufficient documentation

## 2018-01-10 DIAGNOSIS — Z951 Presence of aortocoronary bypass graft: Secondary | ICD-10-CM | POA: Diagnosis not present

## 2018-01-10 DIAGNOSIS — J449 Chronic obstructive pulmonary disease, unspecified: Secondary | ICD-10-CM | POA: Diagnosis not present

## 2018-01-10 DIAGNOSIS — Z87891 Personal history of nicotine dependence: Secondary | ICD-10-CM | POA: Insufficient documentation

## 2018-01-10 DIAGNOSIS — N289 Disorder of kidney and ureter, unspecified: Secondary | ICD-10-CM | POA: Diagnosis not present

## 2018-01-10 DIAGNOSIS — Z8673 Personal history of transient ischemic attack (TIA), and cerebral infarction without residual deficits: Secondary | ICD-10-CM | POA: Diagnosis not present

## 2018-01-10 LAB — PLATELET INHIBITION P2Y12: PLATELET FUNCTION P2Y12: 242 [PRU] (ref 194–418)

## 2018-01-10 SURGERY — IR WITH ANESTHESIA
Anesthesia: General | Laterality: Right

## 2018-01-10 MED ORDER — SODIUM CHLORIDE 0.9 % IV SOLN
INTRAVENOUS | Status: DC
  Administered 2018-01-10: 07:00:00 via INTRAVENOUS

## 2018-01-10 NOTE — Telephone Encounter (Signed)
   Primary Cardiologist: Sinclair Grooms, MD   Chart reviewed as part of pre-operative protocol coverage (request below).   Derek [name] was scheduled for carotid artery angiography & possible stent palcement today and when he arrived to [facility] it was noted that his serum creatinine on his preop labs from 01/05/18 was 2.69 and the procedure was canceled. The patient was told to contact Dr. Abel Presto office for input on his renal status and clearance for surgery. (see note from this am 01/10/18) Dr. Rosana Hoes office has also requested cardiac clearance.   Patient was contacted 01/10/2018 in reference to pre-operative risk assessment for pending surgery as outlined below.  Derek [name] was last seen on 11/25/2017 by Dr. Tamala Julian.  Since that day, Derek [name] has done well without any new symptoms. He denies any chest pain/pressure or shortness of breath. He has had no new stroke symptoms.   According to the Revised Cardiac Risk Index this patient is high risk for this high risk vascular surgery with >11% estimated risk of perioperative cardiac event.  According to the [facility] activity status index this patient is able to complete about 5.72 METs.   I will route this to Dr. Tamala Julian for his input. Also the patient has a pacemaker check with Dr. Caryl Comes on 1/31 and should keep this appointment.   Note-The patient's daughter, Derek [name], flew in from [place] and will be here until Friday. She is available to assist with his coordination of care.  [phone].   Daune Perch, NP 01/10/2018, 4:46 PM

## 2018-01-10 NOTE — Telephone Encounter (Signed)
First of all, this is not vascular surgery.  It is a percutaneous intervention and the risk is quite a bit lower than 11%.  I would classify his risk as less than 5% with the most likely complication being bleeding.  I will not comment on kidney function as this needs to be done by nephrology in conjunction with interventional radiology to minimize the risk of acute on chronic kidney injury.  After reviewing records, current creatinine level/kidney function or stable compared to historical.  He will be at risk for contrast-induced nephropathy.

## 2018-01-10 NOTE — Anesthesia Preprocedure Evaluation (Signed)
Anesthesia Evaluation  Patient identified by MRN, date of birth, ID band Patient awake    Reviewed: Allergy & Precautions, NPO status , Patient's Chart, lab work & pertinent test results  Airway Mallampati: I  TM Distance: >3 FB Neck ROM: Full    Dental   Pulmonary sleep apnea , COPD, former smoker,    Pulmonary exam normal        Cardiovascular hypertension, + CAD, + Past MI and + CABG  Normal cardiovascular exam+ pacemaker      Neuro/Psych TIA   GI/Hepatic   Endo/Other    Renal/GU Renal InsufficiencyRenal disease     Musculoskeletal   Abdominal   Peds  Hematology   Anesthesia Other Findings   Reproductive/Obstetrics                             Anesthesia Physical Anesthesia Plan  ASA: III  Anesthesia Plan: General   Post-op Pain Management:    Induction:   PONV Risk Score and Plan: 2  Airway Management Planned: LMA  Additional Equipment:   Intra-op Plan:   Post-operative Plan: Extubation in OR  Informed Consent: I have reviewed the patients History and Physical, chart, labs and discussed the procedure including the risks, benefits and alternatives for the proposed anesthesia with the patient or authorized representative who has indicated his/her understanding and acceptance.     Plan Discussed with: CRNA and Surgeon  Anesthesia Plan Comments:         Anesthesia Quick Evaluation

## 2018-01-10 NOTE — Progress Notes (Signed)
Patient ID: Derek [name], male   DOB: Jan 22, 1935, 83 y.o.   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  He was seen by Dr. Estanislado Pandy in December to arrange for this procedure.  He was recently started on plavix 75mg  daily on 1/14 in preparation for the procedure today.  Upon review of his labs, his creatinine is noted to be 2.69 today which is slightly up from his recent baseline of around 2.5, but up from 1.6 baseline about 5 months ago.  He does see a nephrologist, Dr. Florene Glen, according to the patient's fiance and son.  His P2Y12 is also 248 today, which is too high to safely proceed with intervention as well.  Given these two findings, we will not proceed with NIR intervention.  This has been discussed and explained to the patient and his family.  They all understand and agree with the plan to hold off as it is not safe.  The patient will need to follow up with Dr. Florene Glen to determine if the risks of contrast administration is worth the possible benefit and if so how we would need to go about preparing the patient for this intervention.  The patient has been instructed to contact Dr. Abel Presto office to get clearance set up.  The patient, if cleared, will at that time be started on Brilinta.  He will stop his plavix, but not start Brilinta until given the ok by nephrology, potentially, and by cardiology as he has a pacemaker and really needs cardiac clearance as well.  If he is cleared by all services, then we can discuss proceeding with an intervention at a later date.  [name] 8:52 AM 01/10/2018

## 2018-01-10 NOTE — Telephone Encounter (Signed)
   [facility] Pre-operative Risk Assessment    Request for surgical clearance:  1. What type of surgery is being performed? TX: for carotid stenosis and possible stent placement   2. When is this surgery scheduled?  No schedule date   3. What type of clearance is required (medical clearance vs. Pharmacy clearance to hold med vs. Both)? Cardiac clearance  4. Are there any medications that need to be held prior to surgery and how long? None  5. Practice name and name of physician performing surgery? [facility]  Dr.Sanjeev Deveshwar   6. What is your office phone and fax number? F#:[phone]   7. Anesthesia type (None, local, MAC, general) ? General   Dajanee Voorheis M 01/10/2018, 1:53 PM  _________________________________________________________________   (provider comments below)

## 2018-01-12 NOTE — Telephone Encounter (Signed)
Please send to Dr. Estanislado Pandy.

## 2018-01-13 DIAGNOSIS — Z6833 Body mass index (BMI) 33.0-33.9, adult: Secondary | ICD-10-CM | POA: Diagnosis not present

## 2018-01-13 DIAGNOSIS — Z950 Presence of cardiac pacemaker: Secondary | ICD-10-CM | POA: Diagnosis not present

## 2018-01-13 DIAGNOSIS — N179 Acute kidney failure, unspecified: Secondary | ICD-10-CM | POA: Diagnosis not present

## 2018-01-13 DIAGNOSIS — Z7901 Long term (current) use of anticoagulants: Secondary | ICD-10-CM | POA: Diagnosis not present

## 2018-01-13 DIAGNOSIS — I4891 Unspecified atrial fibrillation: Secondary | ICD-10-CM | POA: Diagnosis not present

## 2018-01-13 DIAGNOSIS — E8770 Fluid overload, unspecified: Secondary | ICD-10-CM | POA: Diagnosis not present

## 2018-01-13 DIAGNOSIS — N183 Chronic kidney disease, stage 3 (moderate): Secondary | ICD-10-CM | POA: Diagnosis not present

## 2018-01-13 DIAGNOSIS — I129 Hypertensive chronic kidney disease with stage 1 through stage 4 chronic kidney disease, or unspecified chronic kidney disease: Secondary | ICD-10-CM | POA: Diagnosis not present

## 2018-01-20 ENCOUNTER — Ambulatory Visit (INDEPENDENT_AMBULATORY_CARE_PROVIDER_SITE_OTHER): Payer: Medicare Other | Admitting: Internal Medicine

## 2018-01-20 ENCOUNTER — Encounter: Payer: Self-pay | Admitting: Internal Medicine

## 2018-01-20 VITALS — BP 144/76 | HR 80 | Ht 69.0 in | Wt 197.2 lb

## 2018-01-20 DIAGNOSIS — I4891 Unspecified atrial fibrillation: Secondary | ICD-10-CM | POA: Diagnosis not present

## 2018-01-20 DIAGNOSIS — I4430 Unspecified atrioventricular block: Secondary | ICD-10-CM | POA: Diagnosis not present

## 2018-01-20 DIAGNOSIS — Z950 Presence of cardiac pacemaker: Secondary | ICD-10-CM | POA: Diagnosis not present

## 2018-01-20 DIAGNOSIS — I6521 Occlusion and stenosis of right carotid artery: Secondary | ICD-10-CM

## 2018-01-20 NOTE — Patient Instructions (Addendum)
Medication Instructions:  Your physician recommends that you continue on your current medications as directed. Please refer to the Current Medication list given to you today.  Labwork: None ordered.  Testing/Procedures: None ordered.  Follow-Up: Your physician recommends that you schedule a follow-up appointment in: 9 months with Tommye Standard, PA  Remote monitoring is used to monitor your Pacemaker from home. This monitoring reduces the number of office visits required to check your device to one time per year. It allows [place] to keep an eye on the functioning of your device to ensure it is working properly. You are scheduled for a device check from home on 04/05/2018, You may send your transmission at any time that day. If you have a wireless device, the transmission will be sent automatically. After your physician reviews your transmission, you will receive a postcard with your next transmission date.    Any Other Special Instructions Will Be Listed Below (If Applicable).     If you need a refill on your cardiac medications before your next appointment, please call your pharmacy.

## 2018-01-20 NOTE — Progress Notes (Signed)
Patient Care Team: Merrilee Seashore, MD as PCP - General (Internal Medicine) Belva Crome, MD as PCP - Cardiology (Cardiology) Belva Crome, MD as Consulting Physician (Cardiology)   HPI  Derek [name] is a 83 y.o. male Seen following pacemaker implantation for symptomatic bradycardia in the context of permanent atrial fibrillation.  He has had a history of recurrent strokes.  He has carotid disease and there have been discussions regarding carotid stenting in the context of his renal insufficiency.  Date Cr Hgb  1/19 2.69 13.9        Echocardiogram 9/18 EF normal  Records and Results Reviewed   Past Medical History:  Diagnosis Date  . AAA (abdominal aortic aneurysm) ([place])   . Arthritis   . Chronic kidney disease   . COPD (chronic obstructive pulmonary disease) ([place])   . Coronary artery disease   . Headache   . Hypertension   . Hypothyroidism   . Myocardial infarction ([place])   . Presence of permanent cardiac pacemaker   . Shortness of breath    on exertion;  has productive cough at times  . Sleep apnea    states mild . no cpap  . Stroke [facility])     Past Surgical History:  Procedure Laterality Date  . APPENDECTOMY  1963  . CATARACT EXTRACTION     w/ lid rise  . CORONARY ARTERY BYPASS GRAFT  2007  . EYE SURGERY Right March 2016   Cataract  . EYE SURGERY Bilateral May 2016   Eyebrow Lift  . EYE SURGERY Left June 2016   Cataract  . IR ANGIO INTRA EXTRACRAN SEL COM CAROTID INNOMINATE BILAT MOD SED  09/12/2017  . IR ANGIO VERTEBRAL SEL SUBCLAVIAN INNOMINATE UNI R MOD SED  09/12/2017  . IR RADIOLOGIST EVAL & MGMT  12/07/2017  . KNEE ARTHROSCOPY  02/26/2012   Procedure: ARTHROSCOPY KNEE;  Surgeon: Sydnee Cabal, MD;  [facility];  Service: Orthopedics;  Laterality: Left;  medial menisectomy and chondraplasty  . PACEMAKER IMPLANT N/A 10/04/2017   Procedure: PACEMAKER IMPLANT;  Surgeon: Deboraha Sprang, MD;  Location: [facility];  Service: Cardiovascular;  Laterality: N/A;    Current Outpatient Medications  Medication Sig Dispense Refill  . acetaminophen (TYLENOL) 500 MG tablet Take 500 mg by mouth every 6 (six) hours as needed for moderate pain or headache.    Marland Kitchen apixaban (ELIQUIS) 2.5 MG TABS tablet Take 1 tablet (2.5 mg total) by mouth 2 (two) times daily. Hold for 2 days, restart on 10/06/17 AM 180 tablet 3  . aspirin EC 81 MG tablet Take 81 mg by mouth daily after supper.    . Biotin 5000 MCG TABS Take 5,000 mcg by mouth daily.    . budesonide-formoterol (SYMBICORT) 160-4.5 MCG/ACT inhaler Inhale 2 puffs into the lungs 2 (two) times daily. 3 Inhaler 3  . ezetimibe (ZETIA) 10 MG tablet Take 10 mg by mouth daily after supper.     . Febuxostat (ULORIC) 80 MG TABS Take 80 mg by mouth daily after supper.    . furosemide (LASIX) 20 MG tablet Take 20 mg by mouth.    Marland Kitchen GLUCOSAMINE-CHONDROITIN PO Take 1 tablet by mouth daily.     Marland Kitchen levothyroxine (SYNTHROID, LEVOTHROID) 88 MCG tablet Take 88 mcg by mouth daily before breakfast.    . Magnesium 250 MG TABS Take 250 mg by mouth daily.    . Multiple Vitamin (MULTIVITAMIN WITH MINERALS) TABS tablet Take  1 tablet by mouth daily.    . Omega-3 Fatty Acids (FISH OIL PO) Take 3 capsules by mouth daily.     . OXYGEN Inhale 2 L into the lungs as needed (for  shortness of breath with exertion only).     Vladimir Faster Glycol-Propyl Glycol (SYSTANE OP) Place 1 drop into both eyes daily as needed (for dry eyes).    . pregabalin (LYRICA) 50 MG capsule Take 50 mg by mouth 2 (two) times daily.    . rosuvastatin (CRESTOR) 20 MG tablet Take 20 mg by mouth at bedtime.     Marland Kitchen terazosin (HYTRIN) 5 MG capsule Take 10 mg by mouth 2 (two) times daily.    Marland Kitchen tiotropium (SPIRIVA) 18 MCG inhalation capsule Place 18 mcg into inhaler and inhale daily.     . Turmeric 500 MG CAPS Take 500 mg by mouth daily.    . vitamin B-12 (CYANOCOBALAMIN) 1000 MCG tablet Take 1,000 mcg by mouth daily.     No current  facility-administered medications for this visit.     Allergies  Allergen Reactions  . Other Hives, Dermatitis and Other (See Comments)    -mycins  . Sulfa Antibiotics Rash  . Latex Rash  . Tape Rash and Other (See Comments)    Reaction to adhesive tape - pls use paper tape      Review of Systems negative except from HPI and PMH  Physical Exam BP (!) 144/76   Pulse 80   Ht 5\' 9"  (1.753 m)   Wt 197 lb 4 oz (89.5 kg)   SpO2 96%   BMI 29.13 kg/m  Well developed and well nourished in no acute distress HENT normal E scleral and icterus clear Neck Supple JVP flat; carotids brisk and full Clear to ausculation Regular rate and rhythm, no murmurs gallops or rub Soft with active bowel sounds No clubbing cyanosis   Edema Alert and oriented, grossly normal motor and sensory function Skin Warm and Dry  ECG demonstrates ventricular pacing with underlying atrial fibrillation Assessment and  Plan Atrial fibrillation with a slow ventricular response  Chronic diastolic heart failure  Prior stroke  Chronic kidney disease stage IV  Pacemaker implantation-[employer]   Pacemaker function is normal and he is much improved.  The issue about which we spent most time however is trying to come up with a decision plan as to how to address the issue of the risk of carotid stenting and impending kidney failure  I have told him that I think the most important number he can get is the absolute risk reduction from the stenting.  This could be balanced with the predicted time to hemodialysis we should you get from Dr. Florene Glen.  We spent more than 50% of our >25 min visit in face to face counseling regarding the above   Current medicines are reviewed at length with the patient today .  The patient does not   have concerns regarding medicines.

## 2018-01-27 DIAGNOSIS — R5383 Other fatigue: Secondary | ICD-10-CM | POA: Diagnosis not present

## 2018-01-27 DIAGNOSIS — N189 Chronic kidney disease, unspecified: Secondary | ICD-10-CM | POA: Diagnosis not present

## 2018-01-27 DIAGNOSIS — Z0000 Encounter for general adult medical examination without abnormal findings: Secondary | ICD-10-CM | POA: Diagnosis not present

## 2018-01-27 DIAGNOSIS — E034 Atrophy of thyroid (acquired): Secondary | ICD-10-CM | POA: Diagnosis not present

## 2018-01-27 DIAGNOSIS — N184 Chronic kidney disease, stage 4 (severe): Secondary | ICD-10-CM | POA: Diagnosis not present

## 2018-01-27 DIAGNOSIS — E789 Disorder of lipoprotein metabolism, unspecified: Secondary | ICD-10-CM | POA: Diagnosis not present

## 2018-01-27 DIAGNOSIS — I10 Essential (primary) hypertension: Secondary | ICD-10-CM | POA: Diagnosis not present

## 2018-01-31 ENCOUNTER — Encounter: Payer: Self-pay | Admitting: Internal Medicine

## 2018-01-31 ENCOUNTER — Ambulatory Visit (INDEPENDENT_AMBULATORY_CARE_PROVIDER_SITE_OTHER): Payer: Medicare Other | Admitting: Internal Medicine

## 2018-01-31 VITALS — BP 112/62 | HR 72 | Ht 69.0 in | Wt 195.4 lb

## 2018-01-31 DIAGNOSIS — J9611 Chronic respiratory failure with hypoxia: Secondary | ICD-10-CM

## 2018-01-31 DIAGNOSIS — J449 Chronic obstructive pulmonary disease, unspecified: Secondary | ICD-10-CM

## 2018-01-31 DIAGNOSIS — I6521 Occlusion and stenosis of right carotid artery: Secondary | ICD-10-CM

## 2018-01-31 NOTE — Patient Instructions (Addendum)
Try off spiriva for now and see if you don't do just as well with exercise and less irritation of your throat   If worse > spiriva respimat (different device requires coaching) would be best choice    Please schedule a follow up visit in 6 months but call sooner if needed

## 2018-01-31 NOTE — Progress Notes (Signed)
Subjective:     Patient ID: Derek [name], male   DOB: December 09, 1935,    MRN: [id number]    Brief patient profile:  83 yowm quit smoking 2001 "smoker's cough" and wt 210  resolved then started cough again 2008/9 assoc doe > rx  spiriva  helped  But cough continued/ worse in winter then in [place] 03/11/16 admit with pna/copd> back to baseline but referred to pulmonary clinic 04/02/2016 by Dr Wilson Singer ? Copd severity> proved to have GOLD II criteria 04/17/2016     History of Present Illness  04/02/2016 1st [facility] office visit/ Derek [name]   Chief Complaint  Patient presents with  . Pulmonary Consult    Referred by Dr. Wilson Singer. Pt states 2 wks ago while in [place], he was hospitalized x 5 days for PNA and COPD exacerbation.  He states that today her breathing is "excellent". He has o2 at home but has not needed to use this.   has proair but not using while on spiriva dpi   Has 02 but not using  Off pred x one week s relapse - legs swelled on pred, some better off but not resolved  Not limited by breathing from desired activities  But not aerobically active = MMRC1 = can walk nl pace, flat grade, can't hurry or go uphills or steps s sob rec No change   except ok to take furosemide 20 mg one daily if needed for swelling   Plan A = Automatic = Spiriva one daily each am Plan B = Backup Only use your albuterol(proair) as rescue    10/27/2016  f/u ov/Derek [name] re:  GOLD II copd/ advair 250/ spiriva dpi  Chief Complaint  Patient presents with  . Follow-up    Breathing is doing well. He has only used rescue inhaler x 1 since his last visit.   doe = MMRC1 = can walk nl pace, flat grade, can't hurry or go uphills or steps s sob   rec  Continue to take the lasix (furosemide) 20 mg one daily if needed for swelling    02/17/2017  f/u ov/Derek [name] re:  advair 250 / spiriva dpi  Chief Complaint  Patient presents with  . Follow-up    Pt here for o2 recert. He states he only uses o2 maybe once per wk if he gets SOB  after exertion.   one hour per week if over does it sits down and wear 02, does not use it with exertion/ does not monitor his own sats  Leg swelling is better Doe still = mmrc1 rec Only use the -02 2lpm while walking - goal is to keep your saturations always above 90% but especially while walking       10/27/2017  f/u ov/Derek [name] re: COPD GOLD II / maint symb 160/spiriva Chief Complaint  Patient presents with  . Follow-up    Breathing is doing well and he denies any new co's. His cardiologist d/c'ed his albuterol inhaler.   S/p pacemaker back to baseline = MMRC1 = can walk nl pace, flat grade, can't hurry or go uphills or steps s sob  Using 02 after walks - does not check while walking  Has hosp bed usually sleeps flat s 02  Cards took his saba away  rec ]Symbicort 160 Take 2 puffs first thing in am and then another 2 puffs about 12 hours later and follow  Am symbicort with capsule of spiriva  Ok to use 02 with exercise or if your 02 sats  drop less than 90% but otherwise no need  Please schedule a follow up visit in 3 months but call sooner if needed  with all medications /inhalers/ solutions in hand so we can verify exactly what you are taking. This includes all medications from all doctors and over the counters    01/31/2018  f/u ov/Derek [name] re: gold ii/ not using 02 since last ov Chief Complaint  Patient presents with  . Follow-up    morning productive cough gray no blood present, no CP, SOB, or Wheezing. Hoarsness and increased mucus production when goes long periods of no speaking.   Dyspnea:  Gradually making progress = MMRC1 = can walk nl pace, flat grade, can't hurry or go uphills or steps s sob   Cough:dry and  assoc with voice raspy on dpi spiriva (va doesn't have smi on formulary) Sleep: ok s 02 / mostly on side  No obvious day to day or daytime variability or assoc excess/ purulent sputum or mucus plugs or hemoptysis or cp or chest tightness, subjective wheeze or overt sinus or  hb symptoms. No unusual exposure hx or h/o childhood pna/ asthma or knowledge of premature birth.  Sleeping ok flat without nocturnal  or early am exacerbation  of respiratory  c/o's or need for noct saba. Also denies any obvious fluctuation of symptoms with weather or environmental changes or other aggravating or alleviating factors except as outlined above   Current Allergies, Complete Past Medical History, Past Surgical History, Family History, and Social History were reviewed in [employer] record.  ROS  The following are not active complaints unless bolded Hoarseness, sore throat, dysphagia, dental problems, itching, sneezing,  nasal congestion or discharge of excess mucus or purulent secretions, ear ache,   fever, chills, sweats, unintended wt loss or wt gain, classically pleuritic or exertional cp,  orthopnea pnd or leg swelling, presyncope, palpitations, abdominal pain, anorexia, nausea, vomiting, diarrhea  or change in bowel habits or change in bladder habits, change in stools or change in urine, dysuria, hematuria,  rash, arthralgias, visual complaints, headache, numbness, weakness or ataxia or problems with walking or coordination,  change in mood/affect or memory.        Current Meds  Medication Sig  . acetaminophen (TYLENOL) 500 MG tablet Take 500 mg by mouth every 6 (six) hours as needed for moderate pain or headache.  Marland Kitchen apixaban (ELIQUIS) 2.5 MG TABS tablet Take 1 tablet (2.5 mg total) by mouth 2 (two) times daily. Hold for 2 days, restart on 10/06/17 AM  . aspirin EC 81 MG tablet Take 81 mg by mouth daily after supper.  . Biotin 5000 MCG TABS Take 5,000 mcg by mouth daily.  . budesonide-formoterol (SYMBICORT) 160-4.5 MCG/ACT inhaler Inhale 2 puffs into the lungs 2 (two) times daily.  Marland Kitchen ezetimibe (ZETIA) 10 MG tablet Take 10 mg by mouth daily after supper.   . Febuxostat (ULORIC) 80 MG TABS Take 80 mg by mouth daily after supper.  . furosemide (LASIX) 20 MG  tablet Take 20 mg by mouth.  Marland Kitchen GLUCOSAMINE-CHONDROITIN PO Take 1 tablet by mouth daily.   Marland Kitchen levothyroxine (SYNTHROID, LEVOTHROID) 88 MCG tablet Take 88 mcg by mouth daily before breakfast.  . Magnesium 250 MG TABS Take 250 mg by mouth daily.  . Multiple Vitamin (MULTIVITAMIN WITH MINERALS) TABS tablet Take 1 tablet by mouth daily.  . Omega-3 Fatty Acids (FISH OIL PO) Take 3 capsules by mouth daily.   . OXYGEN Inhale 2 L into the  lungs as needed (for  shortness of breath with exertion only).   Vladimir Faster Glycol-Propyl Glycol (SYSTANE OP) Place 1 drop into both eyes daily as needed (for dry eyes).  . pregabalin (LYRICA) 50 MG capsule Take 50 mg by mouth 2 (two) times daily.  . rosuvastatin (CRESTOR) 20 MG tablet Take 20 mg by mouth at bedtime.   Marland Kitchen terazosin (HYTRIN) 5 MG capsule Take 10 mg by mouth 2 (two) times daily.  Marland Kitchen tiotropium (SPIRIVA) 18 MCG inhalation capsule Place 18 mcg into inhaler and inhale daily.   . Turmeric 500 MG CAPS Take 500 mg by mouth daily.  . vitamin B-12 (CYANOCOBALAMIN) 1000 MCG tablet Take 1,000 mcg by mouth daily.                    Objective:   Physical Exam    amb pleasant slt hoarse  wm nad   01/31/2018        195  10/27/2017        203  07/27/2017          221  04/26/2017          226 02/17/2017        231  10/27/2016        234  07/27/2016          230  04/17/2016        232  04/02/16 239 lb 3.4 oz (108.505 kg)  11/06/15 226 lb (102.513 kg)  04/02/15 236 lb (107.049 kg)     Vital signs reviewed - Note on arrival 02 sats  96% on RA        HEENT: nl dentition, turbinates bilaterally, and oropharynx. Nl external ear canals without cough reflex   NECK :  without JVD/Nodes/TM/ nl carotid upstrokes bilaterally   LUNGS: no acc muscle use,  Nl contour chest with decreased BS bilaterally without cough on insp or exp maneuvers   CV:  RRR  no s3 or murmur or increase in P2, and no edema   ABD: quite obese but  soft and nontender with nl inspiratory  excursion in the supine position. No bruits or organomegaly appreciated, bowel sounds nl  MS:  Nl gait/ ext warm without deformities, calf tenderness, cyanosis or clubbing No obvious joint restrictions   SKIN: warm and dry without lesions    NEURO:  alert, approp, nl sensorium with  no motor or cerebellar deficits apparent.           I personally reviewed images and agree with radiology impression as follows:  CXR:   10/05/17 1. Left-sided pacing device as above.  Negative for a pneumothorax 2. Tiny pleural effusions.  Streaky atelectasis or scar at the bases 3. Borderline cardiomegaly 4. Prior granulomatous disease    Assessment:

## 2018-02-01 ENCOUNTER — Encounter: Payer: Self-pay | Admitting: Internal Medicine

## 2018-02-01 NOTE — Assessment & Plan Note (Signed)
Quit smoking 2001  - PFT's  04/17/2016  FEV1 2.00 (72 % ) ratio 60  p no % improvement from saba p no prior to study with DLCO  38 % corrects to 50 % for alv volume   - 7//2017 worse on spiriva monotherapy > added advair 100 > ov 07/27/2016 changed to advair 250 - 07/27/2017  After extensive coaching HFA effectiveness =    90% > change advair to symb 160 2bid due to hoarseness / continue spiriva dpi as gets it free from [place]  - 01/31/2018 try off spiriva dpi due to throat irritation   Adequate control on present rx, reviewed in detail with pt > no change in rx needed  X for trial off spiriva (like usininghigh octane fuel - should only notice at high levels of activity if stopping it makes a difference)  Each maintenance medication was reviewed in detail including most importantly the difference between maintenance and as needed and under what circumstances the prns are to be used.  Please see AVS for specific  Instructions which are unique to this visit and I personally typed out  which were reviewed in detail in writing with the patient and a copy provided.

## 2018-02-01 NOTE — Assessment & Plan Note (Signed)
Placed 02 in [place] in May 2017  =  2lpm 24/7 but using prn instead  -   02/17/2017  Patient Saturations on Room Air at Rest = 95% Patient Saturations on Room Air while Ambulating = 88% p one lap = 185 ft mod pace Patient Saturations on 2 Liters of oxygen while Ambulating = 95%   as  Of 01/31/2018 using prn only

## 2018-02-03 DIAGNOSIS — I63411 Cerebral infarction due to embolism of right middle cerebral artery: Secondary | ICD-10-CM | POA: Diagnosis not present

## 2018-02-03 DIAGNOSIS — I10 Essential (primary) hypertension: Secondary | ICD-10-CM | POA: Diagnosis not present

## 2018-02-03 DIAGNOSIS — J449 Chronic obstructive pulmonary disease, unspecified: Secondary | ICD-10-CM | POA: Diagnosis not present

## 2018-02-03 DIAGNOSIS — I2510 Atherosclerotic heart disease of native coronary artery without angina pectoris: Secondary | ICD-10-CM | POA: Diagnosis not present

## 2018-02-03 DIAGNOSIS — I481 Persistent atrial fibrillation: Secondary | ICD-10-CM | POA: Diagnosis not present

## 2018-02-03 DIAGNOSIS — E782 Mixed hyperlipidemia: Secondary | ICD-10-CM | POA: Diagnosis not present

## 2018-02-03 DIAGNOSIS — I129 Hypertensive chronic kidney disease with stage 1 through stage 4 chronic kidney disease, or unspecified chronic kidney disease: Secondary | ICD-10-CM | POA: Diagnosis not present

## 2018-02-03 DIAGNOSIS — N184 Chronic kidney disease, stage 4 (severe): Secondary | ICD-10-CM | POA: Diagnosis not present

## 2018-02-03 DIAGNOSIS — I6529 Occlusion and stenosis of unspecified carotid artery: Secondary | ICD-10-CM | POA: Diagnosis not present

## 2018-02-08 DIAGNOSIS — J449 Chronic obstructive pulmonary disease, unspecified: Secondary | ICD-10-CM | POA: Diagnosis not present

## 2018-02-08 DIAGNOSIS — E782 Mixed hyperlipidemia: Secondary | ICD-10-CM | POA: Diagnosis not present

## 2018-02-08 DIAGNOSIS — I481 Persistent atrial fibrillation: Secondary | ICD-10-CM | POA: Diagnosis not present

## 2018-02-08 DIAGNOSIS — N184 Chronic kidney disease, stage 4 (severe): Secondary | ICD-10-CM | POA: Diagnosis not present

## 2018-02-08 DIAGNOSIS — I2510 Atherosclerotic heart disease of native coronary artery without angina pectoris: Secondary | ICD-10-CM | POA: Diagnosis not present

## 2018-02-08 DIAGNOSIS — M1A09X0 Idiopathic chronic gout, multiple sites, without tophus (tophi): Secondary | ICD-10-CM | POA: Diagnosis not present

## 2018-02-12 LAB — CUP PACEART INCLINIC DEVICE CHECK
Battery Remaining Longevity: 141 mo
Battery Voltage: 3.14 V
Brady Statistic AP VP Percent: 0 %
Brady Statistic AS VS Percent: 0 %
Implantable Lead Implant Date: 20181015
Implantable Lead Implant Date: 20181015
Implantable Lead Location: 753859
Implantable Lead Model: 5076
Implantable Pulse Generator Implant Date: 20181015
Lead Channel Impedance Value: 266 Ohm
Lead Channel Impedance Value: 380 Ohm
Lead Channel Impedance Value: 494 Ohm
Lead Channel Pacing Threshold Amplitude: 0.75 V
Lead Channel Pacing Threshold Pulse Width: 0.4 ms
Lead Channel Setting Pacing Amplitude: 2 V
Lead Channel Setting Pacing Amplitude: 2.5 V
Lead Channel Setting Sensing Sensitivity: 1.2 mV
MDC IDC LEAD LOCATION: 753860
MDC IDC MSMT LEADCHNL RA SENSING INTR AMPL: 0.375 mV
MDC IDC MSMT LEADCHNL RV IMPEDANCE VALUE: 399 Ohm
MDC IDC MSMT LEADCHNL RV SENSING INTR AMPL: 11.5 mV
MDC IDC SESS DTM: 20190131204531
MDC IDC SET LEADCHNL RV PACING PULSEWIDTH: 0.4 ms
MDC IDC STAT BRADY AP VS PERCENT: 0 %
MDC IDC STAT BRADY AS VP PERCENT: 0 %
MDC IDC STAT BRADY RA PERCENT PACED: 0.01 %
MDC IDC STAT BRADY RV PERCENT PACED: 99.43 %

## 2018-02-21 ENCOUNTER — Other Ambulatory Visit: Payer: Self-pay | Admitting: Internal Medicine

## 2018-02-28 DIAGNOSIS — M1A09X0 Idiopathic chronic gout, multiple sites, without tophus (tophi): Secondary | ICD-10-CM | POA: Diagnosis not present

## 2018-02-28 DIAGNOSIS — M150 Primary generalized (osteo)arthritis: Secondary | ICD-10-CM | POA: Diagnosis not present

## 2018-02-28 DIAGNOSIS — Z79899 Other long term (current) drug therapy: Secondary | ICD-10-CM | POA: Diagnosis not present

## 2018-02-28 DIAGNOSIS — N189 Chronic kidney disease, unspecified: Secondary | ICD-10-CM | POA: Diagnosis not present

## 2018-03-02 ENCOUNTER — Telehealth: Payer: Self-pay | Admitting: Interventional Cardiology

## 2018-03-02 NOTE — Telephone Encounter (Signed)
Left message to call back  

## 2018-03-02 NOTE — Telephone Encounter (Signed)
New message   Daughter called this in , she is with her father at his residence   Pt c/o BP issue: STAT if pt c/o blurred vision, one-sided weakness or slurred speech  1. What are your last 5 BP readings?  Friday 154/70 Saturday 122/64 Monday 132/62 Wednesday 89/58 (they took on several machines 5x and it was the same)    2. Are you having any other symptoms (ex. Dizziness, headache, blurred vision, passed out)?  Yesterday headache above left eye and fatigued , but today fatigue is worse   3. What is your BP issue? Fatigued and not feeling his self

## 2018-03-02 NOTE — Telephone Encounter (Addendum)
Spoke with pt and obtained permission to speak with daughter.  Daughter states that pt's girlfriend called her yesterday and informed her that pt seemed a little confused and fatigued.  Daughter got back into town and went to pt's house around midnight and she said you could tell he was very fatigued but he responded normally to her questions.  Pt did not check vitals yesterday.  Denies swelling, SOB, CP, visual disturbances, or slurred speech.  Daughter called d/t the fatigue and pt's BP being 89/58 just before calling.  She checked his BP on home monitor and then took him to [employer] and it was about the same.  Advised daughter to increase pt's fluids to see if this helps bring BP up.  Advised to continue monitoring BP and call us back if BP does not return to normal.  Pt currently only on Furosemide 20mg  QD that effects BP.  Will route to Dr. Tamala Julian to see if any further recommendations.

## 2018-03-02 NOTE — Telephone Encounter (Signed)
Not sure about most recent recordings.  I agree with instructions.  If blood pressure remains low he will need to have blood work to rule out dehydration.  It would be okay to hold or discontinue the furosemide if blood pressure remains low.  Will probably need to have primary care follow-up if that is the case.

## 2018-03-28 DIAGNOSIS — N189 Chronic kidney disease, unspecified: Secondary | ICD-10-CM | POA: Diagnosis not present

## 2018-03-28 DIAGNOSIS — M150 Primary generalized (osteo)arthritis: Secondary | ICD-10-CM | POA: Diagnosis not present

## 2018-03-28 DIAGNOSIS — M1A09X0 Idiopathic chronic gout, multiple sites, without tophus (tophi): Secondary | ICD-10-CM | POA: Diagnosis not present

## 2018-03-28 DIAGNOSIS — Z79899 Other long term (current) drug therapy: Secondary | ICD-10-CM | POA: Diagnosis not present

## 2018-03-28 DIAGNOSIS — D696 Thrombocytopenia, unspecified: Secondary | ICD-10-CM | POA: Diagnosis not present

## 2018-03-29 DIAGNOSIS — H04209 Unspecified epiphora, unspecified lacrimal gland: Secondary | ICD-10-CM | POA: Diagnosis not present

## 2018-03-29 DIAGNOSIS — H02105 Unspecified ectropion of left lower eyelid: Secondary | ICD-10-CM | POA: Diagnosis not present

## 2018-04-05 ENCOUNTER — Telehealth: Payer: Self-pay | Admitting: Cardiology

## 2018-04-05 ENCOUNTER — Ambulatory Visit (INDEPENDENT_AMBULATORY_CARE_PROVIDER_SITE_OTHER): Payer: Medicare Other | Admitting: *Deleted

## 2018-04-05 DIAGNOSIS — R001 Bradycardia, unspecified: Secondary | ICD-10-CM

## 2018-04-05 NOTE — Telephone Encounter (Signed)
LMOVM reminding pt to send remote transmission.   

## 2018-04-06 LAB — CUP PACEART REMOTE DEVICE CHECK
Battery Voltage: 3.1 V
Brady Statistic AS VP Percent: 0 %
Brady Statistic RA Percent Paced: 0.01 %
Date Time Interrogation Session: 20190417141119
Implantable Lead Implant Date: 20181015
Implantable Lead Location: 753859
Implantable Pulse Generator Implant Date: 20181015
Lead Channel Impedance Value: 399 Ohm
Lead Channel Pacing Threshold Pulse Width: 0.4 ms
Lead Channel Sensing Intrinsic Amplitude: 0.375 mV
Lead Channel Sensing Intrinsic Amplitude: 0.375 mV
Lead Channel Setting Pacing Amplitude: 2.5 V
MDC IDC LEAD IMPLANT DT: 20181015
MDC IDC LEAD LOCATION: 753860
MDC IDC MSMT BATTERY REMAINING LONGEVITY: 138 mo
MDC IDC MSMT LEADCHNL RA IMPEDANCE VALUE: 266 Ohm
MDC IDC MSMT LEADCHNL RV IMPEDANCE VALUE: 380 Ohm
MDC IDC MSMT LEADCHNL RV IMPEDANCE VALUE: 494 Ohm
MDC IDC MSMT LEADCHNL RV PACING THRESHOLD AMPLITUDE: 0.625 V
MDC IDC MSMT LEADCHNL RV SENSING INTR AMPL: 13 mV
MDC IDC MSMT LEADCHNL RV SENSING INTR AMPL: 13 mV
MDC IDC SET LEADCHNL RA PACING AMPLITUDE: 2 V
MDC IDC SET LEADCHNL RV PACING PULSEWIDTH: 0.4 ms
MDC IDC SET LEADCHNL RV SENSING SENSITIVITY: 1.2 mV
MDC IDC STAT BRADY AP VP PERCENT: 0 %
MDC IDC STAT BRADY AP VS PERCENT: 0 %
MDC IDC STAT BRADY AS VS PERCENT: 0 %
MDC IDC STAT BRADY RV PERCENT PACED: 99.58 %

## 2018-04-06 NOTE — Progress Notes (Signed)
Remote pacemaker transmission.   

## 2018-04-07 ENCOUNTER — Encounter: Payer: Self-pay | Admitting: Cardiology

## 2018-04-15 DIAGNOSIS — H02135 Senile ectropion of left lower eyelid: Secondary | ICD-10-CM | POA: Diagnosis not present

## 2018-04-15 DIAGNOSIS — H02132 Senile ectropion of right lower eyelid: Secondary | ICD-10-CM | POA: Diagnosis not present

## 2018-04-15 DIAGNOSIS — Z01818 Encounter for other preprocedural examination: Secondary | ICD-10-CM | POA: Diagnosis not present

## 2018-04-15 DIAGNOSIS — H02413 Mechanical ptosis of bilateral eyelids: Secondary | ICD-10-CM | POA: Diagnosis not present

## 2018-04-20 DIAGNOSIS — N183 Chronic kidney disease, stage 3 (moderate): Secondary | ICD-10-CM | POA: Diagnosis not present

## 2018-04-20 DIAGNOSIS — I129 Hypertensive chronic kidney disease with stage 1 through stage 4 chronic kidney disease, or unspecified chronic kidney disease: Secondary | ICD-10-CM | POA: Diagnosis not present

## 2018-04-20 DIAGNOSIS — Z950 Presence of cardiac pacemaker: Secondary | ICD-10-CM | POA: Diagnosis not present

## 2018-04-20 DIAGNOSIS — I6529 Occlusion and stenosis of unspecified carotid artery: Secondary | ICD-10-CM | POA: Diagnosis not present

## 2018-04-20 DIAGNOSIS — I4891 Unspecified atrial fibrillation: Secondary | ICD-10-CM | POA: Diagnosis not present

## 2018-04-20 DIAGNOSIS — Z7901 Long term (current) use of anticoagulants: Secondary | ICD-10-CM | POA: Diagnosis not present

## 2018-04-20 DIAGNOSIS — N179 Acute kidney failure, unspecified: Secondary | ICD-10-CM | POA: Diagnosis not present

## 2018-04-20 DIAGNOSIS — Z6833 Body mass index (BMI) 33.0-33.9, adult: Secondary | ICD-10-CM | POA: Diagnosis not present

## 2018-04-20 DIAGNOSIS — I639 Cerebral infarction, unspecified: Secondary | ICD-10-CM | POA: Diagnosis not present

## 2018-04-20 DIAGNOSIS — E8770 Fluid overload, unspecified: Secondary | ICD-10-CM | POA: Diagnosis not present

## 2018-05-03 DIAGNOSIS — R202 Paresthesia of skin: Secondary | ICD-10-CM | POA: Diagnosis not present

## 2018-05-03 DIAGNOSIS — R5383 Other fatigue: Secondary | ICD-10-CM | POA: Diagnosis not present

## 2018-05-03 DIAGNOSIS — R319 Hematuria, unspecified: Secondary | ICD-10-CM | POA: Diagnosis not present

## 2018-05-03 DIAGNOSIS — I10 Essential (primary) hypertension: Secondary | ICD-10-CM | POA: Diagnosis not present

## 2018-05-03 DIAGNOSIS — N184 Chronic kidney disease, stage 4 (severe): Secondary | ICD-10-CM | POA: Diagnosis not present

## 2018-05-03 DIAGNOSIS — M150 Primary generalized (osteo)arthritis: Secondary | ICD-10-CM | POA: Diagnosis not present

## 2018-05-03 DIAGNOSIS — E789 Disorder of lipoprotein metabolism, unspecified: Secondary | ICD-10-CM | POA: Diagnosis not present

## 2018-05-03 DIAGNOSIS — R410 Disorientation, unspecified: Secondary | ICD-10-CM | POA: Diagnosis not present

## 2018-05-03 DIAGNOSIS — R251 Tremor, unspecified: Secondary | ICD-10-CM | POA: Diagnosis not present

## 2018-05-03 DIAGNOSIS — N390 Urinary tract infection, site not specified: Secondary | ICD-10-CM | POA: Diagnosis not present

## 2018-05-04 ENCOUNTER — Other Ambulatory Visit: Payer: Self-pay | Admitting: Internal Medicine

## 2018-05-04 DIAGNOSIS — R410 Disorientation, unspecified: Secondary | ICD-10-CM

## 2018-05-09 ENCOUNTER — Ambulatory Visit
Admission: RE | Admit: 2018-05-09 | Discharge: 2018-05-09 | Disposition: A | Discharge status: Home / Self Care | Payer: Medicare Other | Source: Ambulatory Visit | Attending: Internal Medicine | Admitting: Internal Medicine

## 2018-05-09 DIAGNOSIS — R410 Disorientation, unspecified: Secondary | ICD-10-CM | POA: Diagnosis not present

## 2018-05-18 DIAGNOSIS — R251 Tremor, unspecified: Secondary | ICD-10-CM | POA: Diagnosis not present

## 2018-05-18 DIAGNOSIS — R410 Disorientation, unspecified: Secondary | ICD-10-CM | POA: Diagnosis not present

## 2018-05-18 DIAGNOSIS — N184 Chronic kidney disease, stage 4 (severe): Secondary | ICD-10-CM | POA: Diagnosis not present

## 2018-05-30 DIAGNOSIS — H02411 Mechanical ptosis of right eyelid: Secondary | ICD-10-CM | POA: Diagnosis not present

## 2018-05-30 DIAGNOSIS — H02835 Dermatochalasis of left lower eyelid: Secondary | ICD-10-CM | POA: Diagnosis not present

## 2018-05-30 DIAGNOSIS — H02132 Senile ectropion of right lower eyelid: Secondary | ICD-10-CM | POA: Diagnosis not present

## 2018-05-30 DIAGNOSIS — H02105 Unspecified ectropion of left lower eyelid: Secondary | ICD-10-CM | POA: Diagnosis not present

## 2018-05-30 DIAGNOSIS — H02413 Mechanical ptosis of bilateral eyelids: Secondary | ICD-10-CM | POA: Diagnosis not present

## 2018-05-30 DIAGNOSIS — H02832 Dermatochalasis of right lower eyelid: Secondary | ICD-10-CM | POA: Diagnosis not present

## 2018-05-30 DIAGNOSIS — H02412 Mechanical ptosis of left eyelid: Secondary | ICD-10-CM | POA: Diagnosis not present

## 2018-05-30 DIAGNOSIS — H02135 Senile ectropion of left lower eyelid: Secondary | ICD-10-CM | POA: Diagnosis not present

## 2018-05-30 DIAGNOSIS — H02102 Unspecified ectropion of right lower eyelid: Secondary | ICD-10-CM | POA: Diagnosis not present

## 2018-06-01 ENCOUNTER — Other Ambulatory Visit (HOSPITAL_COMMUNITY): Payer: Self-pay | Admitting: Interventional Radiology

## 2018-06-01 DIAGNOSIS — I771 Stricture of artery: Secondary | ICD-10-CM

## 2018-06-06 ENCOUNTER — Ambulatory Visit: Payer: Medicare Other | Admitting: Adult Health

## 2018-06-08 ENCOUNTER — Encounter: Payer: Self-pay | Admitting: Adult Health

## 2018-06-08 ENCOUNTER — Ambulatory Visit (INDEPENDENT_AMBULATORY_CARE_PROVIDER_SITE_OTHER): Payer: Medicare Other | Admitting: Adult Health

## 2018-06-08 VITALS — BP 126/74 | HR 81 | Ht 69.0 in | Wt 193.2 lb

## 2018-06-08 DIAGNOSIS — E785 Hyperlipidemia, unspecified: Secondary | ICD-10-CM | POA: Diagnosis not present

## 2018-06-08 DIAGNOSIS — I6310 Cerebral infarction due to embolism of unspecified precerebral artery: Secondary | ICD-10-CM

## 2018-06-08 DIAGNOSIS — I10 Essential (primary) hypertension: Secondary | ICD-10-CM | POA: Diagnosis not present

## 2018-06-08 NOTE — Progress Notes (Signed)
[facility] [address] [place]. [place] [address] [phone]       OFFICE FOLLOW-UP NOTE  Derek. Derek [name] Date of Birth:  1935/02/03 Medical Record Number:  [id number]   HPI: Derek [name] is a 83 year old male seen today for the first office follow-up visit following hospital admissions for posterior circulation TIA in September 2018 and right thalamic infarct in October 2018.  History is obtained from the patient, review of electronic medical records and I have personally reviewed imaging films. Derek Age [name] a 83 y.o.malewas a past medical history of hypertension, coronary artery disease status post CABG, COPDon home oxygen, and hypothyroidism, who was in his usual state of health until about 5:30 PM when he had sudden onset ofunresponsiveness witnessed by his girlfriend.They were cooking at home and he suddenly became unresponsive. She called 911 immediately. The patient was evaluated by EMS and found to have right-sided weakness and was not answering questions appropriately.Activated large vessel occlusion code stroke and brought the patient to the ER.In the emergency room, at the bridge on first examination, the patient was lethargic, dysarthric, poor attention concentration and nonfocal in terms of weakness.He was taken in for a stat noncontrast CT of the head, that revealed a heavily calcified right vertebral artery and basilar artery on the noncontrast CT of the head, and also had a suspicious looking finding which was concerning for thrombus on the distal basilar.This was confirmed with the neuroradiologist on call who also agreed with the finding of possible thrombus in the setting of a heavily calcified basilar.Repeat exam of the patient showed disconjugate gaze, inability to cross midline to the right and worsening dysarthria.He was also at that time weak on the right upper extremity.At that time, decision was made to use IV TPA.A CT angiogram of the head was  done, which showed a heavily calcified right vertebral artery, stenotic basilar with probable distal occlusion, and left P2 occlusion.Left vertebral artery was nondominant and ended in a PICA.As these imaging studies were done, we also ordered a stat MRI brain to evaluate for any evidence of infarct.Neuro interventionist, Dr. Estanislado Pandy, was also contacted and reviewed the images. MRI of the brain did not reveal any evidence of acute ischemia in the brainstem. The patient's exam also started to improve,with him becoming more awake, following all commands, speech becoming less dysarthric. He still did have mild right facial droop and mild right upper extremity weakness. His gaze had no restriction and extraocular movements became intact.  EKG showed atrial fibrillation with a slow ventricular rate.  Cerebral catheter angiogram showed right vertebral artery origin acute occlusion with partial distal reconstitution.  Right ICA showed 75% stenosis.  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  2D echo showed normal ejection fraction LDL cholesterol was 46 mg percent and hemoglobin A1c was 5.4.  Patient was started on aspirin 81 mg and Eliquis 2.5 mg twice daily.  Cardiology was consulted.  Patient's bradycardia was presumed to be AV block and resolved after beta-blockers were discontinued.  Patient was discharged home but returned on 10/03/17 with symptoms of sudden onset of dizziness lightheadedness and left-sided weakness.  He was on Eliquis hence not a candidate for anticoagulation but MRI scan  showed a right thalamic infarct.  Stroke of workup was not repeated since he had had a recent workup a few weeks ago.   Since the present episode occurred while on Eliquis for a few weeks alternatives like changing over to Pradaxa or Xarelto were discussed but due to lack of definite clinical data showing benefit over Eliquis patient decided to stay on Eliquis.  Patient states is done well since discharge.  He had persistent bradycardia and underwent pacemaker insertion on 10/11/17 which went well but he does complain of some tightness in his left arm, chest and shoulder following pacemaker insertion.  He is tolerating Eliquis well without bleeding but does bruise easily.  His blood pressure is well controlled today it is 1 2 9/7 5.  Is tolerating Crestor and Zetia without muscle aches or pains.  He does take some Lyrica for peripheral neuropathy.  He has no new complaints.  06/08/18 UPDATE: From a stroke standpoint he has been doing well and has no residual deficits.  He continues to take both Eliquis and aspirin without side effects of bleeding but does have minor bruising.  He recently underwent eyelid surgery on 05/30/2018 and does have minor bruising under his eyes from this.  Does have minor concerns of memory loss but he attributes this to age-related and does not believe this Alzheimer's or dementia type.  He states he has been having difficulty with short-term but this is been occurring for a while now and denies recent changes.  He does have continued complaints of neck and back stiffness that has been present since the insertion of pacemaker in October 2018.  He does have a follow-up appointment on 07/21/2018 with cardiologist and will bring this up again.  Denies new or worsening stroke/TIA symptoms.  ROS:   14 system review of systems is positive for memory loss are and all other systems negative.  PMH:  Past Medical History:  Diagnosis Date  . AAA (abdominal aortic aneurysm) ([place])   . Arthritis   . Chronic kidney disease   . COPD (chronic obstructive pulmonary disease) ([place])   . Coronary artery disease   .  Headache   . Hypertension   . Hypothyroidism   . Myocardial infarction ([place])   . Presence of permanent cardiac pacemaker   . Shortness of breath    on exertion;  has productive cough at times  . Sleep apnea    states mild . no cpap  . Stroke [facility])     Social History:  Social History   Socioeconomic History  . Marital status: Divorced    Spouse name: Not on file  . Number of children: Not on file  . Years of education: 12+  . Highest education level: Not on file  Occupational History  . Occupation: Retired   Scientific laboratory technician  . Financial resource strain: Not on file  . Food insecurity:    Worry: Not on file    Inability: Not on file  . Transportation needs:    Medical: Not on file    Non-medical: Not on file  Tobacco Use  . Smoking status: Former Smoker    Packs/day: 0.75    Years: 51.00    Pack years: 38.25    Types: Cigarettes    Last attempt to quit: 02/23/2000    Years since quitting: 18.3  . Smokeless tobacco: Never Used  Substance and Sexual Activity  . Alcohol use: Yes    Alcohol/week: 0.0 - 0.6 oz    Comment:  Moderate to little  . Drug use: No  . Sexual activity: Not on file  Lifestyle  . Physical activity:    Days per week: Not on file    Minutes per session: Not on file  . Stress: Not on file  Relationships  . Social connections:    Talks on phone: Not on file    Gets together: Not on file    Attends religious service: Not on file    Active member of club or organization: Not on file    Attends meetings of clubs or organizations: Not on file    Relationship status: Not on file  . Intimate partner violence:    Fear of current or ex partner: Not on file    Emotionally abused: Not on file    Physically abused: Not on file    Forced sexual activity: Not on file  Other Topics Concern  . Not on file  Social History Narrative   Patient lives at home alone    Patient is retired    Patient has 3 years of college     Medications:   Current Outpatient  Medications on File Prior to Visit  Medication Sig Dispense Refill  . acetaminophen (TYLENOL) 500 MG tablet Take 500 mg by mouth every 6 (six) hours as needed for moderate pain or headache.    Marland Kitchen apixaban (ELIQUIS) 2.5 MG TABS tablet Take 1 tablet (2.5 mg total) by mouth 2 (two) times daily. Hold for 2 days, restart on 10/06/17 AM 180 tablet 3  . aspirin EC 81 MG tablet Take 81 mg by mouth daily after supper.    . Biotin 5000 MCG TABS Take 5,000 mcg by mouth daily.    . budesonide-formoterol (SYMBICORT) 160-4.5 MCG/ACT inhaler Inhale 2 puffs into the lungs 2 (two) times daily. 3 Inhaler 3  . ezetimibe (ZETIA) 10 MG tablet Take 10 mg by mouth daily after supper.     . Febuxostat (ULORIC) 80 MG TABS Take 80 mg by mouth daily after supper.    . furosemide (LASIX) 20 MG tablet Take 20 mg by mouth.    Marland Kitchen GLUCOSAMINE-CHONDROITIN PO Take 1 tablet by mouth daily.     Marland Kitchen levothyroxine (SYNTHROID, LEVOTHROID) 88 MCG tablet Take 88 mcg by mouth daily before breakfast.    . Magnesium 250 MG TABS Take 250 mg by mouth daily.    . Multiple Vitamin (MULTIVITAMIN WITH MINERALS) TABS tablet Take 1 tablet by mouth daily.    . Omega-3 Fatty Acids (FISH OIL PO) Take 3 capsules by mouth daily.     . OXYGEN Inhale 2 L into the lungs as needed (for  shortness of breath with exertion only).     Vladimir Faster Glycol-Propyl Glycol (SYSTANE OP) Place 1 drop into both eyes daily as needed (for dry eyes).    . pregabalin (LYRICA) 50 MG capsule Take 50 mg by mouth 2 (two) times daily.    . rosuvastatin (CRESTOR) 20 MG tablet Take 20 mg by mouth at bedtime.     Marland Kitchen terazosin (HYTRIN) 5 MG capsule Take 10 mg by mouth 2 (two) times daily.    . Turmeric 500 MG CAPS Take 500 mg by mouth daily.    . vitamin B-12 (CYANOCOBALAMIN) 1000 MCG tablet Take 1,000 mcg by mouth daily.     No current facility-administered medications on file prior to visit.     Allergies:   Allergies  Allergen Reactions  . Other Hives, Dermatitis and Other  (See Comments)    -  mycins  . Sulfa Antibiotics Rash  . Latex Rash  . Tape Rash and Other (See Comments)    Reaction to adhesive tape - pls use paper tape    Physical Exam General: well developed, well nourished elderly male cholesterol, seated, in no evident distress Head: head normocephalic and atraumatic.  Neck: supple with no carotid or supraclavicular bruits Cardiovascular: regular rate and rhythm, no murmurs Musculoskeletal: no deformity Skin:  no rash/petichiae Vascular:  Normal pulses all extremities Vitals:   06/08/18 1318  BP: 126/74  Pulse: 81   Neurologic Exam Mental Status: Awake and fully alert. Oriented to place and time. Remote memory intact. Attention span, concentration and fund of knowledge appropriate. Mood and affect appropriate.  Cranial Nerves: Fundoscopic exam reveals sharp disc margins. Pupils equal, briskly reactive to light. Extraocular movements full without nystagmus. Visual fields full to confrontation. Hearing intact. Facial sensation intact. Face, tongue, palate moves normally and symmetrically.  Motor: Normal bulk and tone. Normal strength in all tested extremity muscles. Sensory.: intact to touch ,pinprick .position and vibratory sensation.  Coordination: Rapid alternating movements normal in all extremities. Finger-to-nose and heel-to-shin performed accurately bilaterally. Gait and Station: Arises from chair without difficulty. Stance is normal. Gait demonstrates normal stride length and balance . Able to heel, toe and tandem walk with  difficulty.  Reflexes: 1+ and symmetric. Toes downgoing.     ASSESSMENT: 83 year male with right thalamic infarct in October 5916 of embolic etiology due to atrial fibrillation while on Eliquis with residual mild left hemibody paresthesias.  Recent prior history of posterior circulation TIA in September 2018 in treated with iv  TPA with full recovery. Patient returns today for 56-month follow-up and overall doing  well.    PLAN: -Continue aspirin 81 mg daily and Eliquis (apixaban) daily  and zethia  for secondary stroke prevention -F/u with PCP regarding your HLD management -f/u with cardiologist regarding atrial fibrillation and Eliquis management along with pacemaker follow-up -continue to monitor BP at home -Maintain strict control of hypertension with blood pressure goal below 130/90, diabetes with hemoglobin A1c goal below 6.5% and cholesterol with LDL cholesterol (bad cholesterol) goal below 70 mg/dL. I also advised the patient to eat a healthy diet with plenty of whole grains, cereals, fruits and vegetables, exercise regularly and maintain ideal body weight.  Follow up as needed or call earlier if needed  Greater than 50% of time during this 25 minute visit was spent on counseling,explanation of diagnosis of  right thalamic infarct, reviewing risk factor management of HLD and atrial fibrillation, planning of further management, discussion with patient and family and coordination of care  Derek [name], [facility]  [facility] [address] [place], [place] [address]  Phone [phone] Fax [phone]

## 2018-06-08 NOTE — Patient Instructions (Signed)
Continue aspirin 81 mg daily and Eliquis (apixaban) daily  and zetia  for secondary stroke prevention  Continue to follow up with cardiology for pacemaker  Continue to follow up with PCP regarding cholesterol and blood pressure management   Continue to monitor blood pressure at home  Maintain strict control of hypertension with blood pressure goal below 130/90, diabetes with hemoglobin A1c goal below 6.5% and cholesterol with LDL cholesterol (bad cholesterol) goal below 70 mg/dL. I also advised the patient to eat a healthy diet with plenty of whole grains, cereals, fruits and vegetables, exercise regularly and maintain ideal body weight.  Followup in the future with me as needed or call earlier if needed         Thank you for coming to see [place] at [facility]. I hope we have been able to provide you high quality care today.  You may receive a patient satisfaction survey over the next few weeks. We would appreciate your feedback and comments so that we may continue to improve ourselves and the health of our patients.

## 2018-06-08 NOTE — Progress Notes (Signed)
I agree with the above plan 

## 2018-06-16 ENCOUNTER — Ambulatory Visit (HOSPITAL_COMMUNITY)
Admission: RE | Admit: 2018-06-16 | Discharge: 2018-06-16 | Disposition: A | Discharge status: Home / Self Care | Payer: Medicare Other | Source: Ambulatory Visit | Attending: Interventional Radiology | Admitting: Interventional Radiology

## 2018-06-16 DIAGNOSIS — I6523 Occlusion and stenosis of bilateral carotid arteries: Secondary | ICD-10-CM | POA: Diagnosis not present

## 2018-06-16 DIAGNOSIS — I771 Stricture of artery: Secondary | ICD-10-CM

## 2018-06-16 NOTE — Progress Notes (Signed)
Bilateral carotid duplex completed. Right - 40% to 59% by velocities however there was acoustic shadowing which may obscure higher velocities and a greater stenosis. There vertebal artery appears to be occluded. Left - 1% to 39% ICA stenosis. The ICA is extremely tortuous making imaging difficult. Unable to fully evaluate the proximal portion. Vertebral artery flow is antegrade. Derek [name],RVS 06/16/2018 4:19 PM

## 2018-06-29 DIAGNOSIS — M1A09X0 Idiopathic chronic gout, multiple sites, without tophus (tophi): Secondary | ICD-10-CM | POA: Diagnosis not present

## 2018-06-29 DIAGNOSIS — N189 Chronic kidney disease, unspecified: Secondary | ICD-10-CM | POA: Diagnosis not present

## 2018-06-29 DIAGNOSIS — Z79899 Other long term (current) drug therapy: Secondary | ICD-10-CM | POA: Diagnosis not present

## 2018-06-29 DIAGNOSIS — M150 Primary generalized (osteo)arthritis: Secondary | ICD-10-CM | POA: Diagnosis not present

## 2018-06-29 DIAGNOSIS — D696 Thrombocytopenia, unspecified: Secondary | ICD-10-CM | POA: Diagnosis not present

## 2018-07-05 ENCOUNTER — Telehealth (HOSPITAL_COMMUNITY): Payer: Self-pay

## 2018-07-05 ENCOUNTER — Telehealth: Payer: Self-pay

## 2018-07-05 ENCOUNTER — Ambulatory Visit (INDEPENDENT_AMBULATORY_CARE_PROVIDER_SITE_OTHER): Payer: Medicare Other | Admitting: *Deleted

## 2018-07-05 DIAGNOSIS — R001 Bradycardia, unspecified: Secondary | ICD-10-CM

## 2018-07-05 NOTE — Telephone Encounter (Signed)
I spoke with the patient about this.

## 2018-07-05 NOTE — Telephone Encounter (Signed)
Called pt regarding recent us carotid, no answer, left vm. AW 

## 2018-07-05 NOTE — Progress Notes (Signed)
Remote pacemaker transmission.   

## 2018-07-05 NOTE — Telephone Encounter (Signed)
Pt agreed to f/u in 6 months with us carotid. AW 

## 2018-07-06 ENCOUNTER — Encounter: Payer: Self-pay | Admitting: Cardiology

## 2018-07-07 DIAGNOSIS — E782 Mixed hyperlipidemia: Secondary | ICD-10-CM | POA: Diagnosis not present

## 2018-07-07 DIAGNOSIS — M1A09X0 Idiopathic chronic gout, multiple sites, without tophus (tophi): Secondary | ICD-10-CM | POA: Diagnosis not present

## 2018-07-07 DIAGNOSIS — I481 Persistent atrial fibrillation: Secondary | ICD-10-CM | POA: Diagnosis not present

## 2018-07-07 DIAGNOSIS — Z79899 Other long term (current) drug therapy: Secondary | ICD-10-CM | POA: Diagnosis not present

## 2018-07-07 DIAGNOSIS — M150 Primary generalized (osteo)arthritis: Secondary | ICD-10-CM | POA: Diagnosis not present

## 2018-07-07 DIAGNOSIS — I2510 Atherosclerotic heart disease of native coronary artery without angina pectoris: Secondary | ICD-10-CM | POA: Diagnosis not present

## 2018-07-08 LAB — CUP PACEART REMOTE DEVICE CHECK
Battery Remaining Longevity: 137 mo
Brady Statistic AP VP Percent: 0 %
Brady Statistic AP VS Percent: 0 %
Brady Statistic AS VS Percent: 0 %
Implantable Lead Implant Date: 20181015
Implantable Lead Location: 753860
Implantable Lead Model: 5076
Implantable Pulse Generator Implant Date: 20181015
Lead Channel Impedance Value: 285 Ohm
Lead Channel Impedance Value: 399 Ohm
Lead Channel Impedance Value: 418 Ohm
Lead Channel Pacing Threshold Amplitude: 0.625 V
Lead Channel Sensing Intrinsic Amplitude: 12.25 mV
Lead Channel Setting Pacing Amplitude: 2 V
Lead Channel Setting Pacing Pulse Width: 0.4 ms
MDC IDC LEAD IMPLANT DT: 20181015
MDC IDC LEAD LOCATION: 753859
MDC IDC MSMT BATTERY VOLTAGE: 3.04 V
MDC IDC MSMT LEADCHNL RA SENSING INTR AMPL: 0.75 mV
MDC IDC MSMT LEADCHNL RA SENSING INTR AMPL: 0.75 mV
MDC IDC MSMT LEADCHNL RV IMPEDANCE VALUE: 532 Ohm
MDC IDC MSMT LEADCHNL RV PACING THRESHOLD PULSEWIDTH: 0.4 ms
MDC IDC MSMT LEADCHNL RV SENSING INTR AMPL: 12.25 mV
MDC IDC SESS DTM: 20190716181213
MDC IDC SET LEADCHNL RV PACING AMPLITUDE: 2.5 V
MDC IDC SET LEADCHNL RV SENSING SENSITIVITY: 1.2 mV
MDC IDC STAT BRADY AS VP PERCENT: 0 %
MDC IDC STAT BRADY RA PERCENT PACED: 0.01 %
MDC IDC STAT BRADY RV PERCENT PACED: 97.9 %

## 2018-07-14 DIAGNOSIS — R21 Rash and other nonspecific skin eruption: Secondary | ICD-10-CM | POA: Diagnosis not present

## 2018-07-14 DIAGNOSIS — I481 Persistent atrial fibrillation: Secondary | ICD-10-CM | POA: Diagnosis not present

## 2018-07-14 DIAGNOSIS — I6529 Occlusion and stenosis of unspecified carotid artery: Secondary | ICD-10-CM | POA: Diagnosis not present

## 2018-07-14 DIAGNOSIS — I63411 Cerebral infarction due to embolism of right middle cerebral artery: Secondary | ICD-10-CM | POA: Diagnosis not present

## 2018-07-14 DIAGNOSIS — I2510 Atherosclerotic heart disease of native coronary artery without angina pectoris: Secondary | ICD-10-CM | POA: Diagnosis not present

## 2018-07-14 DIAGNOSIS — D696 Thrombocytopenia, unspecified: Secondary | ICD-10-CM | POA: Diagnosis not present

## 2018-07-14 DIAGNOSIS — D721 Eosinophilia: Secondary | ICD-10-CM | POA: Diagnosis not present

## 2018-07-14 DIAGNOSIS — J449 Chronic obstructive pulmonary disease, unspecified: Secondary | ICD-10-CM | POA: Diagnosis not present

## 2018-07-19 NOTE — Progress Notes (Signed)
Cardiology Office Note:    Date:  07/21/2018   ID:  Derek [name], DOB [birth year], MRN [id number]  PCP:  Derek Seashore, MD  Cardiologist:  Derek Grooms, MD   Referring MD: Derek Seashore, MD   Chief Complaint  Patient presents with  . Atrial Fibrillation  . Advice Only    pacemaker    History of Present Illness:    Derek [name] is a 83 y.o. male with a hx of  tachycardia-bradycardia syndrome, atrial fibrillation, embolic CVA, chronic anticoagulation therapy, CKD stage IV, coagulation with Eliquis.  Derek [name] is doing well.  Since his pacemaker was placed he is felt that from his left shoulder blade to the mid axillary line in the subcostal region there is a sensation of pulling or stretching like a "rope".  Otherwise he feels well.  He denies palpitations, syncope, chest pain, lower extremity edema, and transient neurological symptoms.  He has not noted blood in his urine or stool.     Past Medical History:  Diagnosis Date  . AAA (abdominal aortic aneurysm) ([place])   . Arthritis   . Chronic kidney disease   . COPD (chronic obstructive pulmonary disease) ([place])   . Coronary artery disease   . Headache   . Hypertension   . Hypothyroidism   . Myocardial infarction ([place])   . Presence of permanent cardiac pacemaker   . Shortness of breath    on exertion;  has productive cough at times  . Sleep apnea    states mild . no cpap  . Stroke [facility])     Past Surgical History:  Procedure Laterality Date  . APPENDECTOMY  1963  . CATARACT EXTRACTION     w/ lid rise  . CORONARY ARTERY BYPASS GRAFT  2007  . EYE SURGERY Right March 2016   Cataract  . EYE SURGERY Bilateral May 2016   Eyebrow Lift  . EYE SURGERY Left June 2016   Cataract  . IR ANGIO INTRA EXTRACRAN SEL COM CAROTID INNOMINATE BILAT MOD SED  09/12/2017  . IR ANGIO VERTEBRAL SEL SUBCLAVIAN INNOMINATE UNI R MOD SED  09/12/2017  . IR RADIOLOGIST EVAL & MGMT  12/07/2017  . KNEE ARTHROSCOPY  02/26/2012   Procedure: ARTHROSCOPY KNEE;  Surgeon: Sydnee Cabal, MD;  Location: [facility];  Service: Orthopedics;  Laterality: Left;  medial menisectomy and chondraplasty  . PACEMAKER IMPLANT N/A 10/04/2017   Procedure: PACEMAKER IMPLANT;  Surgeon: Deboraha Sprang, MD;  Location: [place] [facility];  Service: Cardiovascular;  Laterality: N/A;    Current Medications: Current Meds  Medication Sig  . acetaminophen (TYLENOL) 500 MG tablet Take 500 mg by mouth every 6 (six) hours as needed for moderate pain or headache.  Marland Kitchen apixaban (ELIQUIS) 2.5 MG TABS tablet Take 1 tablet (2.5 mg total) by mouth 2 (two) times daily. Hold for 2 days, restart on 10/06/17 AM  . aspirin EC 81 MG tablet Take 81 mg by mouth daily after supper.  Marland Kitchen atorvastatin (LIPITOR) 40 MG tablet Take 40 mg by mouth daily.  . Biotin 5000 MCG TABS Take 5,000 mcg by mouth daily.  . budesonide-formoterol (SYMBICORT) 160-4.5 MCG/ACT inhaler Inhale 2 puffs into the lungs 2 (two) times daily.  Marland Kitchen ezetimibe (ZETIA) 10 MG tablet Take 10 mg by mouth daily after supper.   . furosemide (LASIX) 20 MG tablet Take 20 mg by mouth.  Marland Kitchen GLUCOSAMINE-CHONDROITIN PO Take 1 tablet by mouth daily.   Marland Kitchen levothyroxine (SYNTHROID, LEVOTHROID) 88 MCG  tablet Take 88 mcg by mouth daily before breakfast.  . Magnesium 250 MG TABS Take 250 mg by mouth daily.  . Multiple Vitamin (MULTIVITAMIN WITH MINERALS) TABS tablet Take 1 tablet by mouth daily.  . Omega-3 Fatty Acids (FISH OIL PO) Take 3 capsules by mouth daily.   . OXYGEN Inhale 2 L into the lungs as needed (for  shortness of breath with exertion only).   . pregabalin (LYRICA) 50 MG capsule Take 50 mg by mouth 2 (two) times daily.  Marland Kitchen PRESCRIPTION MEDICATION Take 1 tablet by mouth daily. ENFORGE 5-160 MG  . PRESCRIPTION MEDICATION ALLOPURINOL 50 MG - Take two (2) tablets (100 mg) by mouth daily.  Marland Kitchen terazosin (HYTRIN) 5 MG capsule Take 10 mg by mouth 2 (two) times daily.  . Turmeric 500 MG CAPS Take 500  mg by mouth daily.  . vitamin B-12 (CYANOCOBALAMIN) 1000 MCG tablet Take 1,000 mcg by mouth daily.     Allergies:   Other; Sulfa antibiotics; Latex; and Tape   Social History   Socioeconomic History  . Marital status: Divorced    Spouse name: Not on file  . Number of children: Not on file  . Years of education: 12+  . Highest education level: Not on file  Occupational History  . Occupation: Retired   Scientific laboratory technician  . Financial resource strain: Not on file  . Food insecurity:    Worry: Not on file    Inability: Not on file  . Transportation needs:    Medical: Not on file    Non-medical: Not on file  Tobacco Use  . Smoking status: Former Smoker    Packs/day: 0.75    Years: 51.00    Pack years: 38.25    Types: Cigarettes    Last attempt to quit: 02/23/2000    Years since quitting: 18.4  . Smokeless tobacco: Never Used  Substance and Sexual Activity  . Alcohol use: Yes    Alcohol/week: 0.0 - 0.6 oz    Comment: Moderate to little  . Drug use: No  . Sexual activity: Not on file  Lifestyle  . Physical activity:    Days per week: Not on file    Minutes per session: Not on file  . Stress: Not on file  Relationships  . Social connections:    Talks on phone: Not on file    Gets together: Not on file    Attends religious service: Not on file    Active member of club or organization: Not on file    Attends meetings of clubs or organizations: Not on file    Relationship status: Not on file  Other Topics Concern  . Not on file  Social History Narrative   Patient lives at home alone    Patient is retired    Patient has 3 years of college      Family History: The patient's family history includes Diabetes in his brother, mother, and sister; Heart attack in his daughter; Heart disease in his mother; Hyperlipidemia in his brother; Hypertension in his brother, sister, and son.  ROS:   Please see the history of present illness.    Muscle pain, dizziness, easy bruising,  headaches, left frontal region.  Difficulty with balance, mild leg swelling, decreased hearing.  All other systems reviewed and are negative.  EKGs/Labs/Other Studies Reviewed:    The following studies were reviewed today: No new data other than January office visit with Dr. Caryl Comes which was reviewed.  Device was functioning  normally at that time.  EKG:  EKG is not ordered today.    Recent Labs: 09/12/2017: TSH 1.794 09/17/2017: NT-Pro BNP 5,156 01/05/2018: ALT 21; BUN 62; Creatinine, Ser 2.69; Hemoglobin 13.3; Platelets 118; Potassium 4.3; Sodium 143  Recent Lipid Panel    Component Value Date/Time   CHOL 109 09/12/2017 0217   TRIG 102 09/12/2017 0217   HDL 43 09/12/2017 0217   CHOLHDL 2.5 09/12/2017 0217   VLDL 20 09/12/2017 0217   LDLCALC 46 09/12/2017 0217    Physical Exam:    VS:  BP 132/82   Pulse 70   Ht 5\' 9"  (1.753 m)   Wt 195 lb 6.4 oz (88.6 kg)   BMI 28.86 kg/m     Wt Readings from Last 3 Encounters:  07/21/18 195 lb 6.4 oz (88.6 kg)  06/08/18 193 lb 3.2 oz (87.6 kg)  01/31/18 195 lb 6.4 oz (88.6 kg)     GEN:  Well nourished, well developed in no acute distress HEENT: Normal NECK: No JVD. LYMPHATICS: No lymphadenopathy CARDIAC: RRR, no murmur, no gallop, 1+ bilateral ankle edema. VASCULAR: 2+ radial and posterior tibial pulses.  No bruits. RESPIRATORY:  Clear to auscultation without rales, wheezing or rhonchi  ABDOMEN: Soft, non-tender, non-distended, No pulsatile mass, MUSCULOSKELETAL: No deformity  SKIN: Warm and dry NEUROLOGIC:  Alert and oriented x 3 PSYCHIATRIC:  Normal affect   ASSESSMENT:    1. Coronary artery disease involving coronary bypass graft of native heart without angina pectoris   2. Essential hypertension   3. Persistent atrial fibrillation ([place])   4. 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 AAA (abdominal aortic aneurysm) without rupture ([place])   6. Aorto-iliac disease ([place])    PLAN:    In order of problems listed  above:  1. Coronary disease is stable without angina. 2. Blood pressure is under good control. 3. Unable to determine if he is in sinus rhythm or atrial fibrillation.  Pacemaker is overriding heart rate keeping him consistently at 70 bpm. 4. No new neurological symptoms. 5. Abdominal aortic aneurysm is being followed by vascular surgery.  Aneurysm size is 4.3 cm.  Plan clinical follow-up in 1 year or earlier if cardiac symptoms.  9 to 47-month follow-up will be arranged and spaced away from the EP follow-up   Medication Adjustments/Labs and Tests Ordered: Current medicines are reviewed at length with the patient today.  Concerns regarding medicines are outlined above.  No orders of the defined types were placed in this encounter.  No orders of the defined types were placed in this encounter.   Patient Instructions  Medication Instructions:  Your physician recommends that you continue on your current medications as directed. Please refer to the Current Medication list given to you today.  Labwork: None  Testing/Procedures: None  Follow-Up: Your physician wants you to follow-up in: 9-12 months with Dr. Tamala Julian.  You will receive a reminder letter in the mail two months in advance. If you don't receive a letter, please call our office to schedule the follow-up appointment.   Any Other Special Instructions Will Be Listed Below (If Applicable).     If you need a refill on your cardiac medications before your next appointment, please call your pharmacy.      Signed, Derek Grooms, MD  07/21/2018 2:30 PM    [facility]

## 2018-07-21 ENCOUNTER — Ambulatory Visit (INDEPENDENT_AMBULATORY_CARE_PROVIDER_SITE_OTHER): Payer: Medicare Other | Admitting: Interventional Cardiology

## 2018-07-21 ENCOUNTER — Encounter: Payer: Self-pay | Admitting: Interventional Cardiology

## 2018-07-21 VITALS — BP 132/82 | HR 70 | Ht 69.0 in | Wt 195.4 lb

## 2018-07-21 DIAGNOSIS — I7409 Other arterial embolism and thrombosis of abdominal aorta: Secondary | ICD-10-CM | POA: Diagnosis not present

## 2018-07-21 DIAGNOSIS — I10 Essential (primary) hypertension: Secondary | ICD-10-CM

## 2018-07-21 DIAGNOSIS — I25810 Atherosclerosis of coronary artery bypass graft(s) without angina pectoris: Secondary | ICD-10-CM | POA: Diagnosis not present

## 2018-07-21 DIAGNOSIS — I481 Persistent atrial fibrillation: Secondary | ICD-10-CM

## 2018-07-21 DIAGNOSIS — I6312 Cerebral infarction due to embolism of basilar artery: Secondary | ICD-10-CM

## 2018-07-21 DIAGNOSIS — I779 Disorder of arteries and arterioles, unspecified: Secondary | ICD-10-CM

## 2018-07-21 DIAGNOSIS — I6521 Occlusion and stenosis of right carotid artery: Secondary | ICD-10-CM | POA: Diagnosis not present

## 2018-07-21 DIAGNOSIS — I714 Abdominal aortic aneurysm, without rupture, unspecified: Secondary | ICD-10-CM

## 2018-07-21 DIAGNOSIS — I4819 Other persistent atrial fibrillation: Secondary | ICD-10-CM

## 2018-07-21 NOTE — Patient Instructions (Signed)
Medication Instructions:  Your physician recommends that you continue on your current medications as directed. Please refer to the Current Medication list given to you today.  Labwork: None  Testing/Procedures: None  Follow-Up: Your physician wants you to follow-up in: 9-12 months with Dr. Smith.  You will receive a reminder letter in the mail two months in advance. If you don't receive a letter, please call our office to schedule the follow-up appointment.   Any Other Special Instructions Will Be Listed Below (If Applicable).     If you need a refill on your cardiac medications before your next appointment, please call your pharmacy.   

## 2018-07-29 DIAGNOSIS — X32XXXD Exposure to sunlight, subsequent encounter: Secondary | ICD-10-CM | POA: Diagnosis not present

## 2018-07-29 DIAGNOSIS — L570 Actinic keratosis: Secondary | ICD-10-CM | POA: Diagnosis not present

## 2018-07-29 DIAGNOSIS — I788 Other diseases of capillaries: Secondary | ICD-10-CM | POA: Diagnosis not present

## 2018-08-01 ENCOUNTER — Ambulatory Visit (INDEPENDENT_AMBULATORY_CARE_PROVIDER_SITE_OTHER)
Admission: RE | Admit: 2018-08-01 | Discharge: 2018-08-01 | Disposition: A | Discharge status: Home / Self Care | Payer: Medicare Other | Source: Ambulatory Visit | Attending: Internal Medicine | Admitting: Internal Medicine

## 2018-08-01 ENCOUNTER — Other Ambulatory Visit (INDEPENDENT_AMBULATORY_CARE_PROVIDER_SITE_OTHER): Payer: Medicare Other

## 2018-08-01 ENCOUNTER — Encounter: Payer: Self-pay | Admitting: Internal Medicine

## 2018-08-01 ENCOUNTER — Ambulatory Visit (INDEPENDENT_AMBULATORY_CARE_PROVIDER_SITE_OTHER): Payer: Medicare Other | Admitting: Internal Medicine

## 2018-08-01 VITALS — BP 118/64 | HR 80 | Ht 69.0 in | Wt 192.0 lb

## 2018-08-01 DIAGNOSIS — R05 Cough: Secondary | ICD-10-CM | POA: Diagnosis not present

## 2018-08-01 DIAGNOSIS — I6521 Occlusion and stenosis of right carotid artery: Secondary | ICD-10-CM

## 2018-08-01 DIAGNOSIS — J449 Chronic obstructive pulmonary disease, unspecified: Secondary | ICD-10-CM

## 2018-08-01 DIAGNOSIS — R058 Other specified cough: Secondary | ICD-10-CM | POA: Insufficient documentation

## 2018-08-01 DIAGNOSIS — R079 Chest pain, unspecified: Secondary | ICD-10-CM | POA: Diagnosis not present

## 2018-08-01 LAB — CBC WITH DIFFERENTIAL/PLATELET
BASOS PCT: 1.1 % (ref 0.0–3.0)
Basophils Absolute: 0.1 10*3/uL (ref 0.0–0.1)
Eosinophils Absolute: 0.6 10*3/uL (ref 0.0–0.7)
Eosinophils Relative: 11.1 % — ABNORMAL HIGH (ref 0.0–5.0)
HCT: 37.9 % — ABNORMAL LOW (ref 39.0–52.0)
HEMOGLOBIN: 12.8 g/dL — AB (ref 13.0–17.0)
LYMPHS ABS: 1.4 10*3/uL (ref 0.7–4.0)
Lymphocytes Relative: 27.3 % (ref 12.0–46.0)
MCHC: 33.7 g/dL (ref 30.0–36.0)
MCV: 96.6 fl (ref 78.0–100.0)
MONOS PCT: 9.5 % (ref 3.0–12.0)
Monocytes Absolute: 0.5 10*3/uL (ref 0.1–1.0)
Neutro Abs: 2.6 10*3/uL (ref 1.4–7.7)
Neutrophils Relative %: 51 % (ref 43.0–77.0)
PLATELETS: 94 10*3/uL — AB (ref 150.0–400.0)
RBC: 3.93 Mil/uL — AB (ref 4.22–5.81)
RDW: 15.5 % (ref 11.5–15.5)
WBC: 5 10*3/uL (ref 4.0–10.5)

## 2018-08-01 MED ORDER — PREDNISONE 10 MG PO TABS: ORAL_TABLET | ORAL | 0 refills | Status: DC

## 2018-08-01 MED ORDER — BUDESONIDE-FORMOTEROL FUMARATE 80-4.5 MCG/ACT IN AERO: INHALATION_SPRAY | RESPIRATORY_TRACT | 12 refills | Status: DC

## 2018-08-01 MED ORDER — PANTOPRAZOLE SODIUM 40 MG PO TBEC: 40.0000 mg | DELAYED_RELEASE_TABLET | Freq: Every day | ORAL | 2 refills | Status: DC

## 2018-08-01 MED ORDER — FAMOTIDINE 20 MG PO TABS: ORAL_TABLET | ORAL | 11 refills | Status: DC

## 2018-08-01 NOTE — Assessment & Plan Note (Addendum)
Quit smoking 2001  - PFT's  04/17/2016  FEV1 2.00 (72 % ) ratio 60  p no % improvement from saba p no prior to study with DLCO  38 % corrects to 50 % for alv volume   - 7//2017 worse on spiriva monotherapy > added advair 100 > ov 07/27/2016 changed to advair 250 - 07/27/2017  After extensive coaching HFA effectiveness =    90% > change advair to symb 160 2bid due to hoarseness / continue spiriva dpi as gets it free from [place] - 01/31/2018 try off spiriva dpi due to throat irritation   - 08/01/2018 try lower symb to 80 2bid due to cough/ throat clearing    At 2liter of FEV1 not likely to be really limited much by copd and more bothered by cough at this point so rec try symb 80 2bid   - The proper method of use, as well as anticipated side effects, of a metered-dose inhaler are discussed and demonstrated to the patient.     I had an extended discussion with the patient reviewing all relevant studies completed to date and  lasting 15 to 20 minutes of a 25 minute visit    See device teaching which extended face to face time for this visit.  Each maintenance medication was reviewed in detail including emphasizing most importantly the difference between maintenance and prns and under what circumstances the prns are to be triggered using an action plan format that is not reflected in the computer generated alphabetically organized AVS which I have not found useful in most complex patients, especially with respiratory illnesses  Please see AVS for specific instructions unique to this visit that I personally wrote and verbalized to the the pt in detail and then reviewed with pt  by my nurse highlighting any  changes in therapy recommended at today's visit to their plan of care.

## 2018-08-01 NOTE — Patient Instructions (Addendum)
Pantoprazole (protonix) 40 mg   Take  30-60 min before first meal of the day and Pepcid (famotidine)  20 mg one @  bedtime until return to office - this is the best way to tell whether stomach acid is contributing to your problem.    GERD (REFLUX)  is an extremely common cause of respiratory symptoms just like yours , many times with no obvious heartburn at all.     It can be treated with medication, but also with lifestyle changes including elevation of the head of your bed (ideally with 6 inch  bed blocks),  Smoking cessation, avoidance of late meals, excessive alcohol, and avoid fatty foods, chocolate, peppermint, colas, red wine, and acidic juices such as orange juice.  NO MINT OR MENTHOL PRODUCTS SO NO COUGH DROPS   USE SUGARLESS CANDY INSTEAD (Jolley ranchers or Stover's or Life Savers) or even ice chips will also do - the key is to swallow to prevent all throat clearing. NO OIL BASED VITAMINS - use powdered substitutes - stop fish oil when coughing .   Prednisone 10 mg take  4 each am x 2 days,   2 each am x 2 days,  1 each am x 2 days and stop   Change symbicort to 80 Take 2 puffs first thing in am and then another 2 puffs about 12 hours later.   Please remember to go to the  x-ray department downstairs in the basement  for your tests - we will call you with the results when they are available.      Please schedule a follow up office visit in 4 weeks, sooner if needed  with all medications /inhalers/ solutions in hand so we can verify exactly what you are taking. This includes all medications from all doctors and over the counters - late add: Consider  trial of gabapentin in not improving

## 2018-08-01 NOTE — Progress Notes (Signed)
Subjective:     Patient ID: Derek [name], male   DOB: 27-Sep-1935,    MRN: [id number]    Brief patient profile:  83 yowm quit smoking 2001 "smoker's cough" and wt 210  resolved then started cough again 2008/9 assoc doe > rx  spiriva  helped  But cough continued/ worse in winter then in [place] 03/11/16 admit with pna/copd> back to baseline but referred to pulmonary clinic 04/02/2016 by Dr Wilson Singer ? Copd severity> proved to have GOLD II criteria 04/17/2016     History of Present Illness  04/02/2016 1st [facility] office visit/ Wert   Chief Complaint  Patient presents with  . Pulmonary Consult    Referred by Dr. Wilson Singer. Pt states 2 wks ago while in [place], he was hospitalized x 5 days for PNA and COPD exacerbation.  He states that today her breathing is "excellent". He has o2 at home but has not needed to use this.   has proair but not using while on spiriva dpi   Has 02 but not using  Off pred x one week s relapse - legs swelled on pred, some better off but not resolved  Not limited by breathing from desired activities  But not aerobically active = MMRC1 = can walk nl pace, flat grade, can't hurry or go uphills or steps s sob rec No change   except ok to take furosemide 20 mg one daily if needed for swelling   Plan A = Automatic = Spiriva one daily each am Plan B = Backup Only use your albuterol(proair) as rescue    01/31/2018  f/u ov/Wert re: gold ii/ no longer using 02  Chief Complaint  Patient presents with  . Follow-up    morning productive cough gray no blood present, no CP, SOB, or Wheezing. Hoarsness and increased mucus production when goes long periods of no speaking.   Dyspnea:  Gradually making progress = MMRC1 = can walk nl pace, flat grade, can't hurry or go uphills or steps s sob  Cough:dry and  assoc with voice raspy on dpi spiriva (va doesn't have smi on formulary) Sleep: ok s 02 / mostly on side rec Try off spiriva for now and see if you don't do just as well with  exercise and less irritation of your throat If worse > spiriva respimat (different device requires coaching) would be best choice      08/01/2018  f/u ov/Wert re:  GOLD II/ breathing better, cough worse  Chief Complaint  Patient presents with  . Follow-up    Breathing is doing well. He no longer has a rescue inhaler.   Dyspnea:  MMRC1 = can walk nl pace, flat grade, can't hurry or go uphills or steps s sob / L leg pain stops first  Cough: more daytime than night/  freq throat clearing/ worse in hot humid weather  X 2 year pattern   Sleeping: on either side/ not on 2  SABA use: not suing  02: has but not using    L lateral cp with cough x 6 m present daily    No obvious day to day or daytime variability or assoc excess/ purulent sputum or mucus plugs or hemoptysis   or chest tightness, subjective wheeze or overt sinus or hb symptoms.    Sleeping ok on either side s 02 and  without nocturnal  or early am exacerbation  of respiratory  c/o's or need for noct saba. Also denies any obvious fluctuation of symptoms  with weather or environmental changes or other aggravating or alleviating factors except as outlined above   No unusual exposure hx or h/o childhood pna/ asthma or knowledge of premature birth.  Current Allergies, Complete Past Medical History, Past Surgical History, Family History, and Social History were reviewed in [employer] record.  ROS  The following are not active complaints unless bolded Hoarseness, sore throat, dysphagia, dental problems, itching, sneezing,  nasal congestion or discharge of excess mucus or purulent secretions, ear ache,   fever, chills, sweats, unintended wt loss or wt gain, classically   exertional cp,  orthopnea pnd or arm/hand swelling  or leg swelling, presyncope, palpitations, abdominal pain, anorexia, nausea, vomiting, diarrhea  or change in bowel habits or change in bladder habits, change in stools or change in urine, dysuria,  hematuria,  rash, arthralgias, visual complaints, headache, numbness, weakness or ataxia or problems with walking or coordination,  change in mood or  memory.        Current Meds  Medication Sig  . acetaminophen (TYLENOL) 500 MG tablet Take 500 mg by mouth every 6 (six) hours as needed for moderate pain or headache.  Marland Kitchen apixaban (ELIQUIS) 2.5 MG TABS tablet Take 1 tablet (2.5 mg total) by mouth 2 (two) times daily. Hold for 2 days, restart on 10/06/17 AM  . aspirin EC 81 MG tablet Take 81 mg by mouth daily after supper.  Marland Kitchen atorvastatin (LIPITOR) 40 MG tablet Take 40 mg by mouth daily.  . Biotin 5000 MCG TABS Take 5,000 mcg by mouth daily.  Marland Kitchen ezetimibe (ZETIA) 10 MG tablet Take 10 mg by mouth daily after supper.   . furosemide (LASIX) 20 MG tablet Take 20 mg by mouth.  Marland Kitchen GLUCOSAMINE-CHONDROITIN PO Take 1 tablet by mouth daily.   Marland Kitchen levothyroxine (SYNTHROID, LEVOTHROID) 88 MCG tablet Take 88 mcg by mouth daily before breakfast.  . Magnesium 250 MG TABS Take 250 mg by mouth daily.  . Multiple Vitamin (MULTIVITAMIN WITH MINERALS) TABS tablet Take 1 tablet by mouth daily.  . OXYGEN Inhale 2 L into the lungs as needed (for  shortness of breath with exertion only).   . pregabalin (LYRICA) 50 MG capsule Take 50 mg by mouth 2 (two) times daily.  Marland Kitchen PRESCRIPTION MEDICATION Take 1 tablet by mouth daily. ENFORGE 5-160 MG  . PRESCRIPTION MEDICATION ALLOPURINOL 50 MG - Take two (2) tablets (100 mg) by mouth daily.  Marland Kitchen terazosin (HYTRIN) 5 MG capsule Take 10 mg by mouth 2 (two) times daily.  . Turmeric 500 MG CAPS Take 500 mg by mouth daily.  . vitamin B-12 (CYANOCOBALAMIN) 1000 MCG tablet Take 1,000 mcg by mouth daily.  .   budesonide-formoterol (SYMBICORT) 160-4.5 MCG/ACT inhaler Inhale 2 puffs into the lungs 2 (two) times daily.  Marland Kitchen  ] Omega-3 Fatty Acids (FISH OIL PO) Take 3 capsules by mouth daily.                       Objective:   Physical Exam   amb obese wm nad harsh vigrous dry cough/  hoarseness   08/01/2018        192    01/31/2018        195  10/27/2017        203  07/27/2017          221  04/26/2017          226 02/17/2017        231  10/27/2016  234  07/27/2016          230  04/17/2016        232  04/02/16 239 lb 3.4 oz (108.505 kg)  11/06/15 226 lb (102.513 kg)  04/02/15 236 lb (107.049 kg)      Vital signs reviewed - Note on arrival 02 sats  96% on RA        HEENT: Edentulous nl  oropharynx. Nl external ear canals without cough reflex -  Mild bilateral non-specific turbinate edema     NECK :  without JVD/Nodes/TM/ nl carotid upstrokes bilaterally   LUNGS: no acc muscle use,  Mild barrel  contour chest wall with bilateral  Distant bs s audible wheeze and  without cough on insp or exp maneuver and mild  Hyperresonant  to  percussion bilaterally     CV:  RRR  no s3 or murmur or increase in P2, and no edema   ABD:  Very obese/ soft and nontender with pos mid/late nsp Hoover's  in the supine position. No bruits or organomegaly appreciated, bowel sounds nl  MS:   Nl gait/  ext warm without deformities, calf tenderness, cyanosis or clubbing No obvious joint restrictions   SKIN: warm and dry without lesions    NEURO:  alert, approp, nl sensorium with  no motor or cerebellar deficits apparent.         CXR PA and Lateral:   08/01/2018 :    I personally reviewed images and agree with radiology impression as follows:   No active disease. Previous CABG. Aortic atherosclerosis. Pacemaker. Scattered granulomas.    I personally reviewed images and agree with radiology impression as follows:   CT sinus on head ct 05/09/18 Minimal right maxillary sinus mucosal thickening. Clear mastoid air cells         Labs ordered 08/01/2018   Allergy profile     Assessment:

## 2018-08-01 NOTE — Assessment & Plan Note (Addendum)
Onset x 2017  Off spiriva dpi  01/31/18 - Allergy profile 08/01/2018 >  Eos 0.6 /  IgE  118 RAST neg   Pt convinced "it's just the weather" but he's only had the cough a few years and now likely also has mscp on L from coughing with harsh upper airway quality and urge to clear his throat c/w Upper airway cough syndrome (previously labeled PNDS),  is so named because it's frequently impossible to sort out how much is  CR/sinusitis with freq throat clearing (which can be related to primary GERD)   vs  causing  secondary (" extra esophageal")  GERD from wide swings in gastric pressure that occur with throat clearing, often  promoting self use of mint and menthol lozenges that reduce the lower esophageal sphincter tone and exacerbate the problem further in a cyclical fashion.   These are the same pts (now being labeled as having "irritable larynx syndrome" by some cough centers) who not infrequently have a history of having failed to tolerate ace inhibitors,  dry powder inhalers(esp advair and spiriva dpi, which may have been the case here but can also see with any high dose hfa ICS) or biphosphonates or report having atypical/extraesophageal reflux symptoms that don't respond to standard doses of PPI  and are easily confused as having aecopd or asthma flares by even experienced allergists/ pulmonologists (myself included).     Will start with lowering the dose of ics and short course prednisone  and return in 4 weeks with all meds in hand using a trust but verify approach to confirm accurate Medication  Reconciliation The principal here is that until we are certain that the  patients are doing what we've asked, it makes no sense to ask them to do more.  Consider  trial of gabapentin in not improving

## 2018-08-02 ENCOUNTER — Telehealth: Payer: Self-pay | Admitting: Internal Medicine

## 2018-08-02 LAB — RESPIRATORY ALLERGY PROFILE REGION II ~~LOC~~
Allergen, A. alternata, m6: <0.1 kU/L
Allergen, Cedar tree, t12: <0.1 kU/L
Allergen, Comm Silver Birch, t9: <0.1 kU/L
Allergen, P. notatum, m1: <0.1 kU/L
CLADOSPORIUM HERBARUM (M2) IGE: <0.1 kU/L
CLASS: 0
CLASS: 0
CLASS: 0
CLASS: 0
CLASS: 0
CLASS: 0
CLASS: 0
CLASS: 0
COMMON RAGWEED (SHORT) (W1) IGE: <0.1 kU/L
Cat Dander: <0.1 kU/L
Class: 0
Class: 0
Class: 0
Class: 0
Class: 0
Class: 0
Class: 0
Class: 0
Class: 0
Class: 0
Class: 0
Class: 0
Class: 0
Class: 0
Class: 0
Class: 0
D. farinae: <0.1 kU/L
IgE (Immunoglobulin E), Serum: 118 kU/L — ABNORMAL HIGH (ref <=114)
Johnson Grass: <0.1 kU/L
Pecan/Hickory Tree IgE: <0.1 kU/L

## 2018-08-02 LAB — INTERPRETATION:

## 2018-08-02 NOTE — Progress Notes (Signed)
Spoke with pt and notified of results per Dr. Wert. Pt verbalized understanding and denied any questions. 

## 2018-08-02 NOTE — Telephone Encounter (Signed)
Notes recorded by Tanda Rockers, MD on 08/01/2018 at 5:22 PM EDT Call pt: Reviewed cxr and no acute change so no change in recommendations made at ov ------------------------------ Spoke with pt. He is aware of results. Nothing further was needed.

## 2018-08-02 NOTE — Progress Notes (Signed)
LMTCB

## 2018-08-05 ENCOUNTER — Encounter: Payer: Self-pay | Admitting: Internal Medicine

## 2018-08-18 DIAGNOSIS — D696 Thrombocytopenia, unspecified: Secondary | ICD-10-CM | POA: Diagnosis not present

## 2018-08-25 DIAGNOSIS — I129 Hypertensive chronic kidney disease with stage 1 through stage 4 chronic kidney disease, or unspecified chronic kidney disease: Secondary | ICD-10-CM | POA: Diagnosis not present

## 2018-08-25 DIAGNOSIS — I10 Essential (primary) hypertension: Secondary | ICD-10-CM | POA: Diagnosis not present

## 2018-08-25 DIAGNOSIS — Z23 Encounter for immunization: Secondary | ICD-10-CM | POA: Diagnosis not present

## 2018-08-25 DIAGNOSIS — E782 Mixed hyperlipidemia: Secondary | ICD-10-CM | POA: Diagnosis not present

## 2018-08-25 DIAGNOSIS — D721 Eosinophilia: Secondary | ICD-10-CM | POA: Diagnosis not present

## 2018-08-25 DIAGNOSIS — I481 Persistent atrial fibrillation: Secondary | ICD-10-CM | POA: Diagnosis not present

## 2018-08-25 DIAGNOSIS — D696 Thrombocytopenia, unspecified: Secondary | ICD-10-CM | POA: Diagnosis not present

## 2018-08-29 ENCOUNTER — Ambulatory Visit (INDEPENDENT_AMBULATORY_CARE_PROVIDER_SITE_OTHER): Payer: Medicare Other | Admitting: Internal Medicine

## 2018-08-29 ENCOUNTER — Encounter: Payer: Self-pay | Admitting: Internal Medicine

## 2018-08-29 VITALS — BP 124/74 | HR 80 | Ht 67.0 in | Wt 190.6 lb

## 2018-08-29 DIAGNOSIS — J449 Chronic obstructive pulmonary disease, unspecified: Secondary | ICD-10-CM | POA: Diagnosis not present

## 2018-08-29 DIAGNOSIS — R058 Other specified cough: Secondary | ICD-10-CM

## 2018-08-29 DIAGNOSIS — R05 Cough: Secondary | ICD-10-CM | POA: Diagnosis not present

## 2018-08-29 DIAGNOSIS — I6521 Occlusion and stenosis of right carotid artery: Secondary | ICD-10-CM | POA: Diagnosis not present

## 2018-08-29 MED ORDER — BUDESONIDE-FORMOTEROL FUMARATE 80-4.5 MCG/ACT IN AERO: INHALATION_SPRAY | RESPIRATORY_TRACT | 3 refills | Status: DC

## 2018-08-29 NOTE — Patient Instructions (Addendum)
Continue symbicort 80 Take 2 puffs first thing in am and then another 2 puffs about 12 hours later.   Work on Doctor, hospital technique:  relax and gently blow all the way out then take a nice smooth deep breath back in, triggering the inhaler at same time you start breathing in.  Hold for up to 5 seconds if you can. Blow out thru nose. Rinse and gargle with water when done      Try off protonix (pantoprazole) and pecid (famotidine)    Please schedule a follow up visit in 3 months but call sooner if needed

## 2018-08-29 NOTE — Progress Notes (Signed)
Subjective:     Patient ID: Derek [name], male   DOB: 04/05/1935,    MRN: [id number]    Brief patient profile:  83 yowm quit smoking 2001 "smoker's cough" and wt 210  resolved then started cough again 2008/9 assoc doe > rx  spiriva  helped  But cough continued/ worse in winter then in [place] 03/11/16 admit with pna/copd> back to baseline but referred to pulmonary clinic 04/02/2016 by Dr Wilson Singer ? Copd severity> proved to have GOLD II criteria 04/17/2016     History of Present Illness  04/02/2016 1st [facility] office visit/ Derek [name]   Chief Complaint  Patient presents with  . Pulmonary Consult    Referred by Dr. Wilson Singer. Pt states 2 wks ago while in [place], he was hospitalized x 5 days for PNA and COPD exacerbation.  He states that today her breathing is "excellent". He has o2 at home but has not needed to use this.   has proair but not using while on spiriva dpi   Has 02 but not using  Off pred x one week s relapse - legs swelled on pred, some better off but not resolved  Not limited by breathing from desired activities  But not aerobically active = MMRC1 = can walk nl pace, flat grade, can't hurry or go uphills or steps s sob rec No change   except ok to take furosemide 20 mg one daily if needed for swelling   Plan A = Automatic = Spiriva one daily each am Plan B = Backup Only use your albuterol(proair) as rescue    01/31/2018  f/u ov/Derek [name] re: gold ii/ no longer using 02  Chief Complaint  Patient presents with  . Follow-up    morning productive cough gray no blood present, no CP, SOB, or Wheezing. Hoarsness and increased mucus production when goes long periods of no speaking.   Dyspnea:  Gradually making progress = MMRC1 = can walk nl pace, flat grade, can't hurry or go uphills or steps s sob  Cough:dry and  assoc with voice raspy on dpi spiriva (va doesn't have smi on formulary) Sleep: ok s 02 / mostly on side rec Try off spiriva for now and see if you don't do just as well with  exercise and less irritation of your throat If worse > spiriva respimat (different device requires coaching) would be best choice      08/01/2018  f/u ov/Derek [name] re:  GOLD II/ breathing better, cough worse  Chief Complaint  Patient presents with  . Follow-up    Breathing is doing well. He no longer has a rescue inhaler.   Dyspnea:  MMRC1 = can walk nl pace, flat grade, can't hurry or go uphills or steps s sob / L leg pain stops first  Cough: more daytime than night/  freq throat clearing/ worse in hot humid weather  X 2 year pattern   Sleeping: on either side/ not on 2  SABA use: not suing  02: has but not using   L lateral cp with cough x 6 m present daily  rec Pantoprazole (protonix) 40 mg   Take  30-60 min before first meal of the day and Pepcid (famotidine)  20 mg one @  bedtime GERD diet  Prednisone 10 mg take  4 each am x 2 days,   2 each am x 2 days,  1 each am x 2 days and stop  Change symbicort to 80 Take 2 puffs first thing in am  and then another 2 puffs about 12 hours later.  Please remember to go to the  x-ray department downstairs in the basement  for your tests - we will call you with the results when they are available.  Please schedule a follow up office visit in 4 weeks   08/29/2018  f/u ov/Derek [name] re:  COPD II / cough still same esp out in heat / humidity  Chief Complaint  Patient presents with  . Follow-up    Breathing is unchanged.   Dyspnea:  L leg limiting > sob  Cough: on heat/humidity exp Sleeping: ok at around 40 degrees  SABA use: none 02: has not using    No obvious day to day or daytime variability or assoc excess/ purulent sputum or mucus plugs or hemoptysis or cp or chest tightness, subjective wheeze or overt sinus or hb symptoms.   Sleeping ok as above  without nocturnal  or early am exacerbation  of respiratory  c/o's or need for noct saba. Also denies any obvious fluctuation of symptoms with weather or environmental changes or other aggravating or  alleviating factors except as outlined above   No unusual exposure hx or h/o childhood pna/ asthma or knowledge of premature birth.  Current Allergies, Complete Past Medical History, Past Surgical History, Family History, and Social History were reviewed in [employer] record.  ROS  The following are not active complaints unless bolded Hoarseness, sore throat, dysphagia, dental problems, itching, sneezing,  nasal congestion or discharge of excess mucus or purulent secretions, ear ache,   fever, chills, sweats, unintended wt loss or wt gain, classically pleuritic or exertional cp,  orthopnea pnd or arm/hand swelling  or leg swelling, presyncope, palpitations, abdominal pain, anorexia, nausea, vomiting, diarrhea  or change in bowel habits or change in bladder habits, change in stools or change in urine, dysuria, hematuria,  rash, arthralgias, visual complaints, headache, numbness, weakness or ataxia or problems with walking or coordination,  change in mood or  memory.        Current Meds  Medication Sig  . acetaminophen (TYLENOL) 500 MG tablet Take 500 mg by mouth every 6 (six) hours as needed for moderate pain or headache.  Marland Kitchen apixaban (ELIQUIS) 2.5 MG TABS tablet Take 1 tablet (2.5 mg total) by mouth 2 (two) times daily. Hold for 2 days, restart on 10/06/17 AM  . aspirin EC 81 MG tablet Take 81 mg by mouth daily after supper.  Marland Kitchen atorvastatin (LIPITOR) 40 MG tablet Take 40 mg by mouth daily.  . Biotin 5000 MCG TABS Take 5,000 mcg by mouth daily.  . budesonide-formoterol (SYMBICORT) 80-4.5 MCG/ACT inhaler Take 2 puffs first thing in am and then another 2 puffs about 12 hours later.  . ezetimibe (ZETIA) 10 MG tablet Take 10 mg by mouth daily after supper.   . famotidine (PEPCID) 20 MG tablet One at bedtime  . furosemide (LASIX) 20 MG tablet Take 20 mg by mouth.  Marland Kitchen GLUCOSAMINE-CHONDROITIN PO Take 1 tablet by mouth daily.   Marland Kitchen levothyroxine (SYNTHROID, LEVOTHROID) 88 MCG tablet  Take 88 mcg by mouth daily before breakfast.  . Magnesium 250 MG TABS Take 250 mg by mouth daily.  . Multiple Vitamin (MULTIVITAMIN WITH MINERALS) TABS tablet Take 1 tablet by mouth daily.  . OXYGEN Inhale 2 L into the lungs as needed (for  shortness of breath with exertion only).   . pantoprazole (PROTONIX) 40 MG tablet Take 1 tablet (40 mg total) by mouth daily. Take 30-60  min before first meal of the day  . pregabalin (LYRICA) 50 MG capsule Take 50 mg by mouth 2 (two) times daily.  Marland Kitchen PRESCRIPTION MEDICATION Take 1 tablet by mouth daily. ENFORGE 5-160 MG  . PRESCRIPTION MEDICATION ALLOPURINOL 50 MG - Take two (2) tablets (100 mg) by mouth daily.  Marland Kitchen terazosin (HYTRIN) 5 MG capsule Take 10 mg by mouth 2 (two) times daily.  . Turmeric 500 MG CAPS Take 500 mg by mouth daily.  . vitamin B-12 (CYANOCOBALAMIN) 1000 MCG tablet Take 1,000 mcg by mouth daily.  . [DISCONTINUED] budesonide-formoterol (SYMBICORT) 80-4.5 MCG/ACT inhaler Take 2 puffs first thing in am and then another 2 puffs about 12 hours later.                    Objective:   Physical Exam   amb slt hoarse wm nad  08/29/2018          190  08/01/2018        192    01/31/2018        195  10/27/2017        203  07/27/2017          221  04/26/2017          226 02/17/2017        231  10/27/2016        234  07/27/2016          230  04/17/2016        232  04/02/16 239 lb 3.4 oz (108.505 kg)  11/06/15 226 lb (102.513 kg)  04/02/15 236 lb (107.049 kg)       Vital signs reviewed - Note on arrival 02 sats  95% on RA     HEENT: Edentulous   -  Mild bilateral non-specific turbinate edema   1+  L >R     HEENT: nl  oropharynx. Nl external ear canals without cough reflex -  Mild bilateral non-specific turbinate edema  /edentulous   NECK :  without JVD/Nodes/TM/ nl carotid upstrokes bilaterally   LUNGS: no acc muscle use,  Mild barrel  contour chest wall with bilateral  Distant bs s audible wheeze and  without cough on insp or exp  maneuver and mild  Hyperresonant  to  percussion bilaterally     CV:  RRR  no s3 or murmur or increase in P2, and  1+ Pitting edema L > R   ABD:  soft and nontender with pos late insp Hoover's  in the supine position. No bruits or organomegaly appreciated, bowel sounds nl  MS:   Nl gait/  ext warm without deformities, calf tenderness, cyanosis or clubbing No obvious joint restrictions   SKIN: warm and dry without lesions    NEURO:  alert, approp, nl sensorium with  no motor or cerebellar deficits apparent.             Assessment:

## 2018-08-30 ENCOUNTER — Encounter: Payer: Self-pay | Admitting: Internal Medicine

## 2018-08-30 NOTE — Assessment & Plan Note (Signed)
Onset x 2017  Off spiriva dpi  01/31/18 - Allergy profile 08/01/2018 >  Eos 0.6 /  IgE  118 RAST neg  - try off gerd rx 08/29/2018   Improved and requesting trial off gerd.  NB the  ramp to expected improvement in symptoms from an empiric trial of PPI (and for that matter, worsening, if a chronic effective medication is stopped)  can be measured in weeks, not days, a common misconception because this is not the same as treating heartburn (no immediate cause and effect relationship)  so that response to therapy or lack thereof can be very difficult to assess especially if the patient is not adherent to the treatment plan which includes dietary restrictions.   rec pay attention to diet/ keep hob up and restart gerd rx for cough for any reasons as cough induces gerd induces cough in cyclical fashion    I had an extended discussion with the patient reviewing all relevant studies completed to date and  lasting 15 to 20 minutes of a 25 minute visit    See device teaching which extended face to face time for this visit.  Each maintenance medication was reviewed in detail including emphasizing most importantly the difference between maintenance and prns and under what circumstances the prns are to be triggered using an action plan format that is not reflected in the computer generated alphabetically organized AVS which I have not found useful in most complex patients, especially with respiratory illnesses  Please see AVS for specific instructions unique to this visit that I personally wrote and verbalized to the the pt in detail and then reviewed with pt  by my nurse highlighting any  changes in therapy recommended at today's visit to their plan of care.

## 2018-08-30 NOTE — Assessment & Plan Note (Signed)
Quit smoking 2001  - PFT's  04/17/2016  FEV1 2.00 (72 % ) ratio 60  p no % improvement from saba p no prior to study with DLCO  38 % corrects to 50 % for alv volume   - 7//2017 worse on spiriva monotherapy > added advair 100 > ov 07/27/2016 changed to advair 250 - 07/27/2017  change advair to symb 160 2bid due to hoarseness / continue spiriva dpi as gets it free from [place] - 01/31/2018 try off spiriva dpi due to throat irritation  - 08/01/2018 try lower symb to 80 2bid due to cough/ throat clearing   - 08/29/2018  After extensive coaching inhaler device,  effectiveness =    90%  Doing ok on the lower strength of symbicort which is bothering his voice less and not limited by doe at this point than by knee arthritis so sno need for [facility]

## 2018-08-31 DIAGNOSIS — N183 Chronic kidney disease, stage 3 (moderate): Secondary | ICD-10-CM | POA: Diagnosis not present

## 2018-08-31 DIAGNOSIS — Z950 Presence of cardiac pacemaker: Secondary | ICD-10-CM | POA: Diagnosis not present

## 2018-08-31 DIAGNOSIS — I6529 Occlusion and stenosis of unspecified carotid artery: Secondary | ICD-10-CM | POA: Diagnosis not present

## 2018-08-31 DIAGNOSIS — E8770 Fluid overload, unspecified: Secondary | ICD-10-CM | POA: Diagnosis not present

## 2018-08-31 DIAGNOSIS — Z7901 Long term (current) use of anticoagulants: Secondary | ICD-10-CM | POA: Diagnosis not present

## 2018-08-31 DIAGNOSIS — I639 Cerebral infarction, unspecified: Secondary | ICD-10-CM | POA: Diagnosis not present

## 2018-08-31 DIAGNOSIS — I129 Hypertensive chronic kidney disease with stage 1 through stage 4 chronic kidney disease, or unspecified chronic kidney disease: Secondary | ICD-10-CM | POA: Diagnosis not present

## 2018-08-31 DIAGNOSIS — Z6833 Body mass index (BMI) 33.0-33.9, adult: Secondary | ICD-10-CM | POA: Diagnosis not present

## 2018-08-31 DIAGNOSIS — N179 Acute kidney failure, unspecified: Secondary | ICD-10-CM | POA: Diagnosis not present

## 2018-08-31 DIAGNOSIS — I4891 Unspecified atrial fibrillation: Secondary | ICD-10-CM | POA: Diagnosis not present

## 2018-09-07 NOTE — Progress Notes (Signed)
Cardiology Office Note Date:  09/08/2018  Patient ID:  Derek [name], DOB 1935-05-07, MRN [id number] PCP:  Merrilee Seashore, MD  Cardiologist:  Dr. Tamala Julian Electrophysiologist: Dr. Caryl Comes Pulmonologist: Dr. Melvyn Novas Neurology:  Dr. Leonie Man Vascular: Dr. Estanislado Pandy Nephrology: Dr. Florene Glen    Chief Complaint: routine EP visit  History of Present Illness: Derek [name] is a 83 y.o. male with history of CAD (CABG)Tachy-brady w/PPM, AFib, stroke, CKD (IV), COPD, HTN, HLD, hypothyroidism, AAA, PVD with carotid diease, planned for R CEA in jan 2019, though cancelled 2/2 worsening Creat, Chronic CHF (diastolic)  He comes in today to be seen for Dr. Caryl Comes.  He last saw him Jan 2019, intact pacer function, no changes were made.  More recently he saw Dr. Tamala Julian in Aug of this year, doing well, mentioned a pulling sensation like a rope lat to the pacer (since implant), no changes were made to his tx, planned for annual cardiology f/u.  He is accompanied by his fiance', doing fairly well.  No CP, palpitations, no SOB.  He denies any near syncope or syncope.  He will infrequently have a momentary feeling of feeling a little dizzy/movement sensation.  They are not planning on pursing carotid intervention, discussed importance though of keeping up with his vascular MD and neurologist.  He asks about switching his furosemide to another diuretic with c/o penile pain with urination, and some intermittent groin pain as well.  He is pending a conversation with his PMD about this.   He denies any SOB, no symptoms of PND or orthopnea, though has an adjustable bed and sleeps chronically with the [facility] raised, at the recommendation of his pulmonologist 2/2 GERD.  No DOE.  He denies any bleeding or signs of bleeding with his eliquis.    Device information: MDT dual chamber PPM, implanted 10/04/17   Past Medical History:  Diagnosis Date  . AAA (abdominal aortic aneurysm) ([place])   . Arthritis   . Chronic kidney disease     . COPD (chronic obstructive pulmonary disease) ([place])   . Coronary artery disease   . Headache   . Hypertension   . Hypothyroidism   . Myocardial infarction ([place])   . Presence of permanent cardiac pacemaker   . Shortness of breath    on exertion;  has productive cough at times  . Sleep apnea    states mild . no cpap  . Stroke [facility])     Past Surgical History:  Procedure Laterality Date  . APPENDECTOMY  1963  . CATARACT EXTRACTION     w/ lid rise  . CORONARY ARTERY BYPASS GRAFT  2007  . EYE SURGERY Right March 2016   Cataract  . EYE SURGERY Bilateral May 2016   Eyebrow Lift  . EYE SURGERY Left June 2016   Cataract  . IR ANGIO INTRA EXTRACRAN SEL COM CAROTID INNOMINATE BILAT MOD SED  09/12/2017  . IR ANGIO VERTEBRAL SEL SUBCLAVIAN INNOMINATE UNI R MOD SED  09/12/2017  . IR RADIOLOGIST EVAL & MGMT  12/07/2017  . KNEE ARTHROSCOPY  02/26/2012   Procedure: ARTHROSCOPY KNEE;  Surgeon: Sydnee Cabal, MD;  Location: [facility];  Service: Orthopedics;  Laterality: Left;  medial menisectomy and chondraplasty  . PACEMAKER IMPLANT N/A 10/04/2017   Procedure: PACEMAKER IMPLANT;  Surgeon: Deboraha Sprang, MD;  Location: [facility];  Service: Cardiovascular;  Laterality: N/A;    Current Outpatient Medications  Medication Sig Dispense Refill  . acetaminophen (TYLENOL) 500 MG tablet Take  500 mg by mouth every 6 (six) hours as needed for moderate pain or headache.    Marland Kitchen apixaban (ELIQUIS) 2.5 MG TABS tablet Take 1 tablet (2.5 mg total) by mouth 2 (two) times daily. Hold for 2 days, restart on 10/06/17 AM (Patient taking differently: Take 2.5 mg by mouth 2 (two) times daily. ) 180 tablet 3  . aspirin EC 81 MG tablet Take 81 mg by mouth daily after supper.    Marland Kitchen atorvastatin (LIPITOR) 40 MG tablet Take 40 mg by mouth daily.    . Biotin 5000 MCG TABS Take 5,000 mcg by mouth daily.    . budesonide-formoterol (SYMBICORT) 80-4.5 MCG/ACT inhaler Take 2 puffs first thing in am and  then another 2 puffs about 12 hours later. 3 Inhaler 3  . ezetimibe (ZETIA) 10 MG tablet Take 10 mg by mouth daily after supper.     . famotidine (PEPCID) 20 MG tablet One at bedtime 30 tablet 11  . furosemide (LASIX) 20 MG tablet Take 20 mg by mouth.    Marland Kitchen GLUCOSAMINE-CHONDROITIN PO Take 1 tablet by mouth daily.     Marland Kitchen levothyroxine (SYNTHROID, LEVOTHROID) 88 MCG tablet Take 88 mcg by mouth daily before breakfast.    . Magnesium 250 MG TABS Take 250 mg by mouth daily.    . Multiple Vitamin (MULTIVITAMIN WITH MINERALS) TABS tablet Take 1 tablet by mouth daily.    . OXYGEN Inhale 2 L into the lungs as needed (for  shortness of breath with exertion only).     . pantoprazole (PROTONIX) 40 MG tablet Take 1 tablet (40 mg total) by mouth daily. Take 30-60 min before first meal of the day 30 tablet 2  . pregabalin (LYRICA) 50 MG capsule Take 50 mg by mouth 2 (two) times daily.    Marland Kitchen PRESCRIPTION MEDICATION Take 1 tablet by mouth daily. ENFORGE 5-160 MG    . PRESCRIPTION MEDICATION ALLOPURINOL 50 MG - Take two (2) tablets (100 mg) by mouth daily.    Marland Kitchen terazosin (HYTRIN) 5 MG capsule Take 10 mg by mouth 2 (two) times daily.    . Turmeric 500 MG CAPS Take 500 mg by mouth daily.    . vitamin B-12 (CYANOCOBALAMIN) 1000 MCG tablet Take 1,000 mcg by mouth daily.     No current facility-administered medications for this visit.     Allergies:   Other; Sulfa antibiotics; Latex; and Tape   Social History:  The patient  reports that he quit smoking about 18 years ago. His smoking use included cigarettes. He has a 38.25 pack-year smoking history. He has never used smokeless tobacco. He reports that he drinks alcohol. He reports that he does not use drugs.   Family History:  The patient's family history includes Diabetes in his brother, mother, and sister; Heart attack in his daughter; Heart disease in his mother; Hyperlipidemia in his brother; Hypertension in his brother, sister, and son.  ROS:  Please see the  history of present illness.    All other systems are reviewed and otherwise negative.   PHYSICAL EXAM:  VS:  Ht 5\' 7"  (1.702 m)   Wt 193 lb (87.5 kg)   BMI 30.23 kg/m  BMI: Body mass index is 30.23 kg/m. Well nourished, well developed, in no acute distress  HEENT: normocephalic, atraumatic  Neck: no JVD, carotid bruits or masses Cardiac: RRR; no significant murmurs, no rubs, or gallops Lungs:   CTA b/l. no wheezing, rhonchi or rales  Abd: soft, nontender MS: no deformity,  age appropriate atrophy Ext: trace edema  Skin: warm and dry, no rash Neuro:  No gross deficits appreciated Psych: euthymic mood, full affect  PPM site is stable, no tethering or discomfort   EKG:  Not done today PPM interrogation done today and reviewed by myself: battery and lead measurements are good, 100% AF, 98.9% VP, he has V rate 40's underlying today   09/13/17: TTE Study Conclusions - Left ventricle: The cavity size was normal. Wall thickness was   increased in a pattern of mild LVH. Indeterminant diastolic   function (atrial fibrillation). The estimated ejection fraction   was 55%. Wall motion was normal; there were no regional wall   motion abnormalities. - Ventricular septum: Mildly D-shaped interventricular septum   suggesting RV pressure/volume overload. - Aortic valve: There was no stenosis. - Aorta: Mildly dilated aortic root. Aortic root dimension: 38 mm   (ED). - Mitral valve: Mildly calcified annulus. There was trivial   regurgitation. - Left atrium: The atrium was moderately dilated. - Right ventricle: The cavity size was mildly dilated. Systolic   function was normal. - Right atrium: The atrium was moderately dilated. - Pulmonary arteries: PA peak pressure: 34 mm Hg (S). - Systemic veins: IVC measured 2.5 cm with < 50% respirophasic   variation, suggesting RA pressure 15 mmHg. Impressions: - The patient was in atrial fibrillation. Normal LV size with mild   LV hypertrophy. EF  55%. Mildly dilated RV with normal systolic   function. Mildly D-shaped interventricular septum suggesting RV   pressure/volume overload. Dilated IVC suggests elevated RV   filling pressure.  Recent Labs: 09/12/2017: TSH 1.794 09/17/2017: NT-Pro BNP 5,156 01/05/2018: ALT 21; BUN 62; Creatinine, Ser 2.69; Potassium 4.3; Sodium 143 08/01/2018: Hemoglobin 12.8; Platelets 94.0  09/12/2017: Cholesterol 109; HDL 43; LDL Cholesterol 46; Total CHOL/HDL Ratio 2.5; Triglycerides 102; VLDL 20   CrCl cannot be calculated (Patient's most recent lab result is older than the maximum 21 days allowed.).   Wt Readings from Last 3 Encounters:  09/08/18 193 lb (87.5 kg)  08/29/18 190 lb 9.6 oz (86.5 kg)  08/01/18 192 lb (87.1 kg)     Other studies reviewed: Additional studies/records reviewed today include: summarized above  ASSESSMENT AND PLAN:  1. PPM     Intact function  2. Permanent Afib     CHA2DS2Vasc is 6, on Eliquis,  appropriately on 2.5mg  BID dosing   3. CAD     No anginal sounding symptoms     C/w Dr. Tamala Julian  4. HFpEF     Weight is up a couple pounds, though he reports a slow steady weight loss by his home scale with healthier/lowsalt eating habits      No symptoms of fluid OL.  Trace edema he reports as baseline     I defer any changes in his diuretic to Dr. Tamala Julian and his nephrologist especially, I have not encountered symptoms he describes from lasix before, he is pending to discuss with his PMD   Disposition: F/u with remotes as usual, 1 year in-clinic with EP, sooner if needed.  Current medicines are reviewed at length with the patient today.  The patient did not have any concerns regarding medicines.  Venetia Night, PA-C 09/08/2018 1:54 PM     [address] [place] [address] [phone] (office)  [phone] (fax)

## 2018-09-08 ENCOUNTER — Ambulatory Visit (INDEPENDENT_AMBULATORY_CARE_PROVIDER_SITE_OTHER): Payer: Medicare Other | Admitting: Physician Assistant

## 2018-09-08 VITALS — BP 106/68 | HR 82 | Ht 67.0 in | Wt 193.0 lb

## 2018-09-08 DIAGNOSIS — Z950 Presence of cardiac pacemaker: Secondary | ICD-10-CM

## 2018-09-08 DIAGNOSIS — I2510 Atherosclerotic heart disease of native coronary artery without angina pectoris: Secondary | ICD-10-CM

## 2018-09-08 DIAGNOSIS — I5030 Unspecified diastolic (congestive) heart failure: Secondary | ICD-10-CM

## 2018-09-08 DIAGNOSIS — I482 Chronic atrial fibrillation: Secondary | ICD-10-CM | POA: Diagnosis not present

## 2018-09-08 DIAGNOSIS — I6521 Occlusion and stenosis of right carotid artery: Secondary | ICD-10-CM

## 2018-09-08 DIAGNOSIS — I4821 Permanent atrial fibrillation: Secondary | ICD-10-CM

## 2018-09-08 NOTE — Patient Instructions (Addendum)
Medication Instructions:   Your physician recommends that you continue on your current medications as directed. Please refer to the Current Medication list given to you today.   If you need a refill on your cardiac medications before your next appointment, please call your pharmacy.  Labwork: NONE ORDERED  TODAY    Testing/Procedures: NONE ORDERED  TODAY    Follow-Up:Your physician wants you to follow-up in: [place] will receive a reminder letter in the mail two months in advance. If you don't receive a letter, please call our office to schedule the follow-up appointment.    Remote monitoring is used to monitor your Pacemaker of ICD from home. This monitoring reduces the number of office visits required to check your device to one time per year. It allows [place] to keep an eye on the functioning of your device to ensure it is working properly. You are scheduled for a device check from home on . 10-15-19You may send your transmission at any time that day. If you have a wireless device, the transmission will be sent automatically. After your physician reviews your transmission, you will receive a postcard with your next transmission date.     Any Other Special Instructions Will Be Listed Below (If Applicable).

## 2018-09-14 DIAGNOSIS — N183 Chronic kidney disease, stage 3 (moderate): Secondary | ICD-10-CM | POA: Diagnosis not present

## 2018-10-04 ENCOUNTER — Ambulatory Visit (INDEPENDENT_AMBULATORY_CARE_PROVIDER_SITE_OTHER): Payer: Medicare Other | Admitting: *Deleted

## 2018-10-04 DIAGNOSIS — R001 Bradycardia, unspecified: Secondary | ICD-10-CM | POA: Diagnosis not present

## 2018-10-05 NOTE — Progress Notes (Signed)
Remote pacemaker transmission.   

## 2018-10-20 ENCOUNTER — Telehealth: Payer: Self-pay

## 2018-10-20 NOTE — Research (Signed)
[place] [facility] Informed Consent   Subject Name: Derek [name]  Subject met inclusion and exclusion criteria.  The informed consent form, study requirements and expectations were reviewed with the subject and questions and concerns were addressed prior to the signing of the consent form.  The subject verbalized understanding of the trail requirements.  The subject agreed to participate in the [facility] trial and signed the informed consent.  The informed consent was obtained prior to performance of any protocol-specific procedures for the subject.  A copy of the signed informed consent was given to the subject and a copy was placed in the subject's medical record.  Tiara S Woodard 10/20/2018, 3:00 PM

## 2018-10-20 NOTE — Telephone Encounter (Signed)
Called to complete annual [employer].

## 2018-10-28 LAB — CUP PACEART REMOTE DEVICE CHECK
Battery Remaining Longevity: 132 mo
Brady Statistic AP VP Percent: 0 %
Brady Statistic AP VS Percent: 0 %
Brady Statistic AS VP Percent: 0 %
Brady Statistic AS VS Percent: 0 %
Date Time Interrogation Session: 20191015040428
Implantable Lead Implant Date: 20181015
Implantable Lead Location: 753859
Implantable Lead Location: 753860
Implantable Lead Model: 5076
Implantable Pulse Generator Implant Date: 20181015
Lead Channel Impedance Value: 380 Ohm
Lead Channel Pacing Threshold Amplitude: 0.625 V
Lead Channel Pacing Threshold Pulse Width: 0.4 ms
Lead Channel Sensing Intrinsic Amplitude: 13.5 mV
Lead Channel Sensing Intrinsic Amplitude: 13.5 mV
Lead Channel Setting Pacing Amplitude: 2 V
Lead Channel Setting Pacing Amplitude: 2.5 V
Lead Channel Setting Pacing Pulse Width: 0.4 ms
MDC IDC LEAD IMPLANT DT: 20181015
MDC IDC MSMT BATTERY VOLTAGE: 3.02 V
MDC IDC MSMT LEADCHNL RA IMPEDANCE VALUE: 266 Ohm
MDC IDC MSMT LEADCHNL RA IMPEDANCE VALUE: 399 Ohm
MDC IDC MSMT LEADCHNL RA SENSING INTR AMPL: 0.625 mV
MDC IDC MSMT LEADCHNL RA SENSING INTR AMPL: 0.625 mV
MDC IDC MSMT LEADCHNL RV IMPEDANCE VALUE: 494 Ohm
MDC IDC SET LEADCHNL RV SENSING SENSITIVITY: 1.2 mV
MDC IDC STAT BRADY RA PERCENT PACED: 0.01 %
MDC IDC STAT BRADY RV PERCENT PACED: 99.73 %

## 2018-11-07 ENCOUNTER — Other Ambulatory Visit: Payer: Self-pay

## 2018-11-07 ENCOUNTER — Other Ambulatory Visit: Payer: Self-pay | Admitting: Internal Medicine

## 2018-11-07 MED ORDER — APIXABAN 2.5 MG PO TABS: 2.5000 mg | ORAL_TABLET | Freq: Two times a day (BID) | ORAL | 0 refills | Status: DC

## 2018-11-07 MED ORDER — APIXABAN 2.5 MG PO TABS: 2.5000 mg | ORAL_TABLET | Freq: Two times a day (BID) | ORAL | 1 refills | Status: DC

## 2018-11-07 NOTE — Telephone Encounter (Signed)
Pt last saw Dr Tamala Julian 07/21/18, last labs 01/05/18 Creat 2.69, age 83, weight 87.5kg, based on specified criteria pt is on appropriate dosage of Eliquis 2.5mg  BID.  Will refill rx.

## 2018-11-07 NOTE — Telephone Encounter (Signed)
Pt states [facility] received rx refill for Eliquis 2.5mg  BID #180 with 1 refill today, however pt only has enough tablets to last until Wednesday 11/09/18 then he will be out.  [facility] advised pt meds would arrive within 5-10 days.  Pt requested 2 week supply of Eliquis be sent to [facility] [address] to ensure he does not run out of Eliquis before his mail order rx arrives.  Advised pt rx being sent to pharmacy as requested.  Pt verbalized understanding.

## 2018-12-29 DIAGNOSIS — Z79899 Other long term (current) drug therapy: Secondary | ICD-10-CM | POA: Diagnosis not present

## 2018-12-29 DIAGNOSIS — D696 Thrombocytopenia, unspecified: Secondary | ICD-10-CM | POA: Diagnosis not present

## 2018-12-29 DIAGNOSIS — N189 Chronic kidney disease, unspecified: Secondary | ICD-10-CM | POA: Diagnosis not present

## 2018-12-29 DIAGNOSIS — M1A09X0 Idiopathic chronic gout, multiple sites, without tophus (tophi): Secondary | ICD-10-CM | POA: Diagnosis not present

## 2018-12-29 DIAGNOSIS — M150 Primary generalized (osteo)arthritis: Secondary | ICD-10-CM | POA: Diagnosis not present

## 2019-01-03 ENCOUNTER — Ambulatory Visit (INDEPENDENT_AMBULATORY_CARE_PROVIDER_SITE_OTHER): Payer: Medicare Other

## 2019-01-03 DIAGNOSIS — I4430 Unspecified atrioventricular block: Secondary | ICD-10-CM

## 2019-01-03 DIAGNOSIS — R001 Bradycardia, unspecified: Secondary | ICD-10-CM

## 2019-01-04 NOTE — Progress Notes (Signed)
Remote pacemaker transmission.   

## 2019-01-07 LAB — CUP PACEART REMOTE DEVICE CHECK
Brady Statistic AP VS Percent: 0 %
Brady Statistic AS VP Percent: 0 %
Implantable Lead Implant Date: 20181015
Implantable Lead Location: 753859
Implantable Lead Model: 5076
Lead Channel Impedance Value: 266 Ohm
Lead Channel Impedance Value: 380 Ohm
Lead Channel Pacing Threshold Pulse Width: 0.4 ms
Lead Channel Sensing Intrinsic Amplitude: 0.625 mV
Lead Channel Sensing Intrinsic Amplitude: 13.625 mV
Lead Channel Sensing Intrinsic Amplitude: 13.625 mV
Lead Channel Setting Pacing Amplitude: 2.5 V
Lead Channel Setting Sensing Sensitivity: 1.2 mV
MDC IDC LEAD IMPLANT DT: 20181015
MDC IDC LEAD LOCATION: 753860
MDC IDC MSMT BATTERY REMAINING LONGEVITY: 129 mo
MDC IDC MSMT BATTERY VOLTAGE: 3.01 V
MDC IDC MSMT LEADCHNL RA SENSING INTR AMPL: 0.625 mV
MDC IDC MSMT LEADCHNL RV IMPEDANCE VALUE: 380 Ohm
MDC IDC MSMT LEADCHNL RV IMPEDANCE VALUE: 494 Ohm
MDC IDC MSMT LEADCHNL RV PACING THRESHOLD AMPLITUDE: 0.625 V
MDC IDC PG IMPLANT DT: 20181015
MDC IDC SESS DTM: 20200114060357
MDC IDC SET LEADCHNL RA PACING AMPLITUDE: 2 V
MDC IDC SET LEADCHNL RV PACING PULSEWIDTH: 0.4 ms
MDC IDC STAT BRADY AP VP PERCENT: 0 %
MDC IDC STAT BRADY AS VS PERCENT: 0 %
MDC IDC STAT BRADY RA PERCENT PACED: 0.01 %
MDC IDC STAT BRADY RV PERCENT PACED: 99.82 %

## 2019-01-09 ENCOUNTER — Other Ambulatory Visit (HOSPITAL_COMMUNITY): Payer: Self-pay | Admitting: Interventional Radiology

## 2019-01-09 DIAGNOSIS — I771 Stricture of artery: Secondary | ICD-10-CM

## 2019-01-24 ENCOUNTER — Ambulatory Visit (HOSPITAL_COMMUNITY)
Admission: RE | Admit: 2019-01-24 | Discharge: 2019-01-24 | Disposition: A | Discharge status: Home / Self Care | Payer: Medicare Other | Source: Ambulatory Visit | Attending: Interventional Radiology | Admitting: Interventional Radiology

## 2019-01-24 DIAGNOSIS — I771 Stricture of artery: Secondary | ICD-10-CM | POA: Diagnosis not present

## 2019-01-24 NOTE — Progress Notes (Signed)
Carotid artery duplex has been completed. Preliminary results can be found in CV Proc through chart review.   01/24/19 2:41 PM Derek [name] RVT

## 2019-01-26 ENCOUNTER — Other Ambulatory Visit (HOSPITAL_COMMUNITY): Payer: Self-pay | Admitting: Interventional Radiology

## 2019-01-26 DIAGNOSIS — I771 Stricture of artery: Secondary | ICD-10-CM

## 2019-02-03 ENCOUNTER — Ambulatory Visit (HOSPITAL_COMMUNITY): Admission: RE | Admit: 2019-02-03 | Payer: Medicare Other | Source: Ambulatory Visit

## 2019-02-07 ENCOUNTER — Telehealth (HOSPITAL_COMMUNITY): Payer: Self-pay

## 2019-02-07 NOTE — Telephone Encounter (Signed)
Called to reschedule consult, no answer, left vm. AW  

## 2019-02-09 ENCOUNTER — Ambulatory Visit (HOSPITAL_COMMUNITY): Payer: Medicare Other

## 2019-02-16 ENCOUNTER — Ambulatory Visit (HOSPITAL_COMMUNITY)
Admission: RE | Admit: 2019-02-16 | Discharge: 2019-02-16 | Disposition: A | Discharge status: Home / Self Care | Payer: Medicare Other | Source: Ambulatory Visit | Attending: Interventional Radiology | Admitting: Interventional Radiology

## 2019-02-16 DIAGNOSIS — I671 Cerebral aneurysm, nonruptured: Secondary | ICD-10-CM | POA: Diagnosis not present

## 2019-02-16 DIAGNOSIS — I6523 Occlusion and stenosis of bilateral carotid arteries: Secondary | ICD-10-CM | POA: Diagnosis not present

## 2019-02-16 DIAGNOSIS — I771 Stricture of artery: Secondary | ICD-10-CM

## 2019-02-16 NOTE — Progress Notes (Signed)
Chief Complaint: Patient was seen today to discuss recent carotid ultrasound findings.  Supervising Physician: Luanne Bras  Patient Status: [facility] - Out-pt  History of Present Illness: Derek [name] is a 84 y.o. male with past medical history significant for arthritis, hypothyroidism, sleep apnea, CKD, COPD, AAA, HTN, MI, CAD s/p CABG x4 and pacemaker placement, a. Fib and CVA who presents today for routine follow up to discuss the findings of his recent carotid ultrasound. He was last seen by our service on 12/17/2017 for follow up after a diagnostic angiogram that was performed on 09/12/17 during a code stroke. He was found to have bilateral occlusions of both vertebral arteries, high grade stenosis of the right vertebrobasilar junction distal to the right PICA. Given he was essentially asymptomatic at the time of his follow up visit it was recommended that he continue Eliquis for stroke prevention and to have carotid ultrasound performed in 6 months if he were to remain asymptomatic. Carotid US was performed 06/16/18 which showed essentially no change in the degree of known stenosis. Patient remained asymptomatic and as such was instructed to repeat the carotid US in another 6 months. Most recent carotid US was performed 01/24/19 which showed 60-79% stenosis of the right ICA, 1-39% stenosis of the left ICA and bilateral vertebral arteries with antegrade flow - all of which has remained essentially unchanged since prior studies. Patient presents today to discuss this most recent US.  Derek [name] presents with his wife today, he endorses ongoing memory issues such as being forgetful and sometimes having trouble finding words that are "on the tip of my tongue." His wife states that she thinks his memory issues have not worsened since our last visit. He also complains of intermittent right sided headaches above the right eye for about 6 months - they are usually mild, do not require any medications  and last for maybe an hour at most. There are no other symptoms associated with the headaches. He also reports chronic left leg and foot pain due to sciatica as well as left shoulder stiffness which began shortly after his stroke. He reports weakness initially in that arm after the stroke however he does not feel that the arm is weak, it just feels stiff and "like there's a balloon in my shoulder." He states he has been seen by several providers regarding this issue and it is annoying but does not prevent him from completing his ADLs.  Past Medical History:  Diagnosis Date  . AAA (abdominal aortic aneurysm) ([place])   . Arthritis   . Chronic kidney disease   . COPD (chronic obstructive pulmonary disease) ([place])   . Coronary artery disease   . Headache   . Hypertension   . Hypothyroidism   . Myocardial infarction ([place])   . Presence of permanent cardiac pacemaker   . Shortness of breath    on exertion;  has productive cough at times  . Sleep apnea    states mild . no cpap  . Stroke [facility])     Past Surgical History:  Procedure Laterality Date  . APPENDECTOMY  1963  . CATARACT EXTRACTION     w/ lid rise  . CORONARY ARTERY BYPASS GRAFT  2007  . EYE SURGERY Right March 2016   Cataract  . EYE SURGERY Bilateral May 2016   Eyebrow Lift  . EYE SURGERY Left June 2016   Cataract  . IR ANGIO INTRA EXTRACRAN SEL COM CAROTID INNOMINATE BILAT MOD SED  09/12/2017  .  IR ANGIO VERTEBRAL SEL SUBCLAVIAN INNOMINATE UNI R MOD SED  09/12/2017  . IR RADIOLOGIST EVAL & MGMT  12/07/2017  . KNEE ARTHROSCOPY  02/26/2012   Procedure: ARTHROSCOPY KNEE;  Surgeon: Sydnee Cabal, MD;  Location: [facility];  Service: Orthopedics;  Laterality: Left;  medial menisectomy and chondraplasty  . PACEMAKER IMPLANT N/A 10/04/2017   Procedure: PACEMAKER IMPLANT;  Surgeon: Deboraha Sprang, MD;  Location: [place] [facility];  Service: Cardiovascular;  Laterality: N/A;    Allergies: Other; Sulfa antibiotics;  Latex; and Tape  Medications: Prior to Admission medications   Medication Sig Start Date End Date Taking? Authorizing Provider  acetaminophen (TYLENOL) 500 MG tablet Take 500 mg by mouth every 6 (six) hours as needed for moderate pain or headache.    [provider]  apixaban (ELIQUIS) 2.5 MG TABS tablet Take 1 tablet (2.5 mg total) by mouth 2 (two) times daily. 11/07/18   Belva Crome, MD  aspirin EC 81 MG tablet Take 81 mg by mouth daily after supper.    [provider]  atorvastatin (LIPITOR) 40 MG tablet Take 40 mg by mouth daily. 05/22/18   [provider]  Biotin 5000 MCG TABS Take 5,000 mcg by mouth daily.    [provider]  budesonide-formoterol (SYMBICORT) 80-4.5 MCG/ACT inhaler Take 2 puffs first thing in am and then another 2 puffs about 12 hours later. 08/29/18   Tanda Rockers, MD  ezetimibe (ZETIA) 10 MG tablet Take 10 mg by mouth daily after supper.     [provider]  famotidine (PEPCID) 20 MG tablet One at bedtime 08/01/18   Tanda Rockers, MD  furosemide (LASIX) 20 MG tablet TAKE 1 TABLET DAILY AS NEEDED FOR SWELLING 11/07/18   Tanda Rockers, MD  GLUCOSAMINE-CHONDROITIN PO Take 1 tablet by mouth daily.     [provider]  levothyroxine (SYNTHROID, LEVOTHROID) 88 MCG tablet Take 88 mcg by mouth daily before breakfast.    [provider]  Magnesium 250 MG TABS Take 250 mg by mouth daily.    [provider]  Multiple Vitamin (MULTIVITAMIN WITH MINERALS) TABS tablet Take 1 tablet by mouth daily.    [provider]  OXYGEN Inhale 2 L into the lungs as needed (for  shortness of breath with exertion only).     [provider]  pantoprazole (PROTONIX) 40 MG tablet Take 1 tablet (40 mg total) by mouth daily. Take 30-60 min before first meal of the day 08/01/18   Tanda Rockers, MD  pregabalin (LYRICA) 50 MG capsule Take 50 mg by mouth 2 (two) times daily.    [provider]    PRESCRIPTION MEDICATION Take 1 tablet by mouth daily. ENFORGE 5-160 MG    [provider]  PRESCRIPTION MEDICATION ALLOPURINOL 50 MG - Take two (2) tablets (100 mg) by mouth daily.    [provider]  terazosin (HYTRIN) 5 MG capsule Take 10 mg by mouth 2 (two) times daily. 08/30/17   [provider]  Turmeric 500 MG CAPS Take 500 mg by mouth daily.    [provider]  vitamin B-12 (CYANOCOBALAMIN) 1000 MCG tablet Take 1,000 mcg by mouth daily.    [provider]     Family History  Problem Relation Age of Onset  . Diabetes Mother   . Heart disease Mother        Before age 84  . Diabetes Sister   . Hypertension Sister   .  Diabetes Brother   . Hypertension Brother   . Hyperlipidemia Brother   . Hypertension Son   . Heart attack Daughter     Social History   Socioeconomic History  . Marital status: Divorced    Spouse name: Not on file  . Number of children: Not on file  . Years of education: 12+  . Highest education level: Not on file  Occupational History  . Occupation: Retired   Scientific laboratory technician  . Financial resource strain: Not on file  . Food insecurity:    Worry: Not on file    Inability: Not on file  . Transportation needs:    Medical: Not on file    Non-medical: Not on file  Tobacco Use  . Smoking status: Former Smoker    Packs/day: 0.75    Years: 51.00    Pack years: 38.25    Types: Cigarettes    Last attempt to quit: 02/23/2000    Years since quitting: 18.9  . Smokeless tobacco: Never Used  Substance and Sexual Activity  . Alcohol use: Yes    Alcohol/week: 0.0 - 1.0 standard drinks    Comment: Moderate to little  . Drug use: No  . Sexual activity: Not on file  Lifestyle  . Physical activity:    Days per week: Not on file    Minutes per session: Not on file  . Stress: Not on file  Relationships  . Social connections:    Talks on phone: Not on file    Gets together: Not on file    Attends religious service: Not  on file    Active member of club or organization: Not on file    Attends meetings of clubs or organizations: Not on file    Relationship status: Not on file  Other Topics Concern  . Not on file  Social History Narrative   Patient lives at home alone    Patient is retired    Patient has 3 years of college      Review of Systems: A 12 point ROS discussed and pertinent positives are indicated in the HPI above.  All other systems are negative.  Review of Systems  Constitutional: Negative for appetite change, fatigue and unexpected weight change.  HENT: Positive for hearing loss (chronic). Negative for ear pain, nosebleeds, tinnitus, trouble swallowing and voice change.   Eyes: Negative for photophobia, pain and visual disturbance.  Respiratory: Positive for shortness of breath (with a lot of walking - has COPD followed pulmonology).   Cardiovascular: Negative for chest pain.  Gastrointestinal: Negative for abdominal pain, blood in stool, diarrhea, nausea and vomiting.  Genitourinary: Negative for hematuria.  Musculoskeletal: Positive for back pain (chronic) and neck pain (chronic).       (+) left shoulder stiffness, (+) LLE weakness and pain d/t sciatica  Neurological: Positive for weakness (LLE), light-headedness (when standing up too quickly), numbness (LLE) and headaches. Negative for dizziness, seizures, syncope, facial asymmetry and speech difficulty.       (+) forgetful  Psychiatric/Behavioral: Negative for confusion.    Vital Signs: There were no vitals taken for this visit.  Physical Exam Constitutional:      General: He is not in acute distress.    Comments: Wife present during exam. Pleasant, talkative, overall good historian but is forgetful at times and requires cues from his wife.   HENT:     Head: Normocephalic.  Pulmonary:     Effort: Pulmonary effort is normal.  Skin:  General: Skin is warm and dry.  Neurological:     Mental Status: He is alert and oriented  to person, place, and time.     Cranial Nerves: No cranial nerve deficit.  Psychiatric:        Mood and Affect: Mood normal.        Behavior: Behavior normal.        Thought Content: Thought content normal.        Judgment: Judgment normal.      Imaging: Vas US Carotid  Result Date: 01/24/2019 Carotid Arterial Duplex Study Indications: Stenosis. Performing Technologist: Oliver Hum RVT  Examination Guidelines: A complete evaluation includes B-mode imaging, spectral Doppler, color Doppler, and power Doppler as needed of all accessible portions of each vessel. Bilateral testing is considered an integral part of a complete examination. Limited examinations for reoccurring indications may be performed as noted.  Right Carotid Findings: +----------+--------+-------+--------+--------------------------------+--------+           PSV cm/sEDV    StenosisDescribe                        Comments                   cm/s                                                    +----------+--------+-------+--------+--------------------------------+--------+ CCA Prox  51      13             smooth and heterogenous                  +----------+--------+-------+--------+--------------------------------+--------+ CCA Distal45      15             smooth, heterogenous and                                                  calcific                                 +----------+--------+-------+--------+--------------------------------+--------+ ICA Prox  402     103    60-79%  smooth, heterogenous and                                                  calcific                                 +----------+--------+-------+--------+--------------------------------+--------+ ICA Mid   123     22             smooth and heterogenous                  +----------+--------+-------+--------+--------------------------------+--------+ ICA Distal59      18  tortuous +----------+--------+-------+--------+--------------------------------+--------+ ECA       120     41                                                      +----------+--------+-------+--------+--------------------------------+--------+ +----------+--------+-------+--------+-------------------+           PSV cm/sEDV cmsDescribeArm Pressure (mmHG) +----------+--------+-------+--------+-------------------+ [id number]                                        +----------+--------+-------+--------+-------------------+ +---------+--------+--+--------+-+---------+ VertebralPSV cm/s33EDV cm/s5Antegrade +---------+--------+--+--------+-+---------+  Left Carotid Findings: +----------+--------+-------+--------+--------------------------------+--------+           PSV cm/sEDV    StenosisDescribe                        Comments                   cm/s                                                    +----------+--------+-------+--------+--------------------------------+--------+ CCA Prox  76      16             smooth and heterogenous                  +----------+--------+-------+--------+--------------------------------+--------+ CCA Distal71      17             smooth, heterogenous and                                                  calcific                                 +----------+--------+-------+--------+--------------------------------+--------+ ICA Prox  82      22             smooth and heterogenous         tortuous +----------+--------+-------+--------+--------------------------------+--------+ ICA Distal51      18                                             tortuous +----------+--------+-------+--------+--------------------------------+--------+ ECA       110     11                                                      +----------+--------+-------+--------+--------------------------------+--------+  +----------+--------+--------+--------+-------------------+ SubclavianPSV cm/sEDV cm/sDescribeArm Pressure (mmHG) +----------+--------+--------+--------+-------------------+           77                                          +----------+--------+--------+--------+-------------------+ +---------+--------+--+--------+-+---------+  VertebralPSV cm/s24EDV cm/s7Antegrade +---------+--------+--+--------+-+---------+  Summary: Right Carotid: Velocities in the right ICA are consistent with a 60-79%                stenosis. Left Carotid: Velocities in the left ICA are consistent with a 1-39% stenosis. Vertebrals: Bilateral vertebral arteries demonstrate antegrade flow. *See table(s) above for measurements and observations.  Electronically signed by Harold Barban MD on 01/24/2019 at 10:18:38 PM.    Final     Labs:  CBC: Recent Labs    08/01/18 1357  WBC 5.0  HGB 12.8*  HCT 37.9*  PLT 94.0*    COAGS: No results for input(s): INR, APTT in the last 8760 hours.  BMP: No results for input(s): NA, K, CL, CO2, GLUCOSE, BUN, CALCIUM, CREATININE, GFRNONAA, GFRAA in the last 8760 hours.  Invalid input(s): CMP  LIVER FUNCTION TESTS: No results for input(s): BILITOT, AST, ALT, ALKPHOS, PROT, ALBUMIN in the last 8760 hours.  TUMOR MARKERS: No results for input(s): AFPTM, CEA, CA199, CHROMGRNA in the last 8760 hours.  Assessment and Plan:  84 y/o M who presents today for follow up on most recent carotid US - last seen by our service on 12/17/2017 for follow up after a diagnostic angiogram that was performed on 09/12/17 during a code stroke. He was found to have bilateral occlusions of both vertebral arteries, high grade stenosis of the right vertebrobasilar junction distal to the right PICA. Given he was essentially asymptomatic at the time of his follow up visit it was recommended that he continue Eliquis for stroke prevention and to have carotid ultrasound performed in 6 months if he were  to remain asymptomatic. Carotid US was performed 06/16/18 which showed essentially no change in the degree of known stenosis. Patient remained asymptomatic and as such was instructed to repeat the carotid US in another 6 months. Most recent carotid US was performed 01/24/19 which showed 60-79% stenosis of the right ICA, 1-39% stenosis of the left ICA and bilateral vertebral arteries with antegrade flow - all of which has remained essentially unchanged since prior studies.  Dr. Estanislado Pandy was present for consultation. Reviewed imaging with patient - specifically the previous angiogram where the degree of right ICA stenosis was first noted and the most recent US where essentially the same degree of stenosis is noted. He continues to deny any symptoms related to this stenosis and has been doing well overall. Discussed continued monitoring of carotid artery stenosis via [place] which he and his wife are in agreement with.  Plan for follow-up: 1. Bilateral carotid US in 6 months with follow up appointment with Dr. Estanislado Pandy to discuss results after this is completed. If he begins to experience symptoms prior to our next follow up visit he will call to be seen earlier. 2. Continue Eliquis and ASA for stroke prevention. 3. Maintain BP, DM and HLD control. 4. Maintain follow up with all providers as indicated. 5. Present to ER for any stroke like symptoms - reviewed stroke like symptoms with patient and wife today who state understanding.   All questions answered and concerns addressed. Patient conveys understanding and agrees with plan.  Thank you for this interesting consult.  I greatly enjoyed meeting Derek [name] and look forward to participating in their care.  A copy of this report was sent to the requesting provider on this date.  Electronically Signed: Joaquim Nam, PA-C 02/16/2019, 10:58 AM   I spent a total of   40 Minutes in face to face in  clinical consultation, greater than 50% of which  was counseling/coordinating care for carotid artery stenosis follow up.

## 2019-02-20 DIAGNOSIS — I4819 Other persistent atrial fibrillation: Secondary | ICD-10-CM | POA: Diagnosis not present

## 2019-02-20 DIAGNOSIS — I10 Essential (primary) hypertension: Secondary | ICD-10-CM | POA: Diagnosis not present

## 2019-02-20 DIAGNOSIS — I129 Hypertensive chronic kidney disease with stage 1 through stage 4 chronic kidney disease, or unspecified chronic kidney disease: Secondary | ICD-10-CM | POA: Diagnosis not present

## 2019-02-20 DIAGNOSIS — E782 Mixed hyperlipidemia: Secondary | ICD-10-CM | POA: Diagnosis not present

## 2019-02-27 DIAGNOSIS — E782 Mixed hyperlipidemia: Secondary | ICD-10-CM | POA: Diagnosis not present

## 2019-02-27 DIAGNOSIS — N184 Chronic kidney disease, stage 4 (severe): Secondary | ICD-10-CM | POA: Diagnosis not present

## 2019-02-27 DIAGNOSIS — Z0000 Encounter for general adult medical examination without abnormal findings: Secondary | ICD-10-CM | POA: Diagnosis not present

## 2019-02-27 DIAGNOSIS — E039 Hypothyroidism, unspecified: Secondary | ICD-10-CM | POA: Diagnosis not present

## 2019-02-27 DIAGNOSIS — J449 Chronic obstructive pulmonary disease, unspecified: Secondary | ICD-10-CM | POA: Diagnosis not present

## 2019-02-27 DIAGNOSIS — D696 Thrombocytopenia, unspecified: Secondary | ICD-10-CM | POA: Diagnosis not present

## 2019-02-27 DIAGNOSIS — I129 Hypertensive chronic kidney disease with stage 1 through stage 4 chronic kidney disease, or unspecified chronic kidney disease: Secondary | ICD-10-CM | POA: Diagnosis not present

## 2019-02-27 DIAGNOSIS — M150 Primary generalized (osteo)arthritis: Secondary | ICD-10-CM | POA: Diagnosis not present

## 2019-02-27 DIAGNOSIS — I63411 Cerebral infarction due to embolism of right middle cerebral artery: Secondary | ICD-10-CM | POA: Diagnosis not present

## 2019-02-27 DIAGNOSIS — I6529 Occlusion and stenosis of unspecified carotid artery: Secondary | ICD-10-CM | POA: Diagnosis not present

## 2019-02-27 DIAGNOSIS — I2510 Atherosclerotic heart disease of native coronary artery without angina pectoris: Secondary | ICD-10-CM | POA: Diagnosis not present

## 2019-02-27 DIAGNOSIS — I4821 Permanent atrial fibrillation: Secondary | ICD-10-CM | POA: Diagnosis not present

## 2019-03-09 DIAGNOSIS — N179 Acute kidney failure, unspecified: Secondary | ICD-10-CM | POA: Diagnosis not present

## 2019-03-09 DIAGNOSIS — I639 Cerebral infarction, unspecified: Secondary | ICD-10-CM | POA: Diagnosis not present

## 2019-03-09 DIAGNOSIS — I2510 Atherosclerotic heart disease of native coronary artery without angina pectoris: Secondary | ICD-10-CM | POA: Diagnosis not present

## 2019-03-09 DIAGNOSIS — N184 Chronic kidney disease, stage 4 (severe): Secondary | ICD-10-CM | POA: Diagnosis not present

## 2019-03-09 DIAGNOSIS — N189 Chronic kidney disease, unspecified: Secondary | ICD-10-CM | POA: Diagnosis not present

## 2019-03-09 DIAGNOSIS — D631 Anemia in chronic kidney disease: Secondary | ICD-10-CM | POA: Diagnosis not present

## 2019-03-09 DIAGNOSIS — J449 Chronic obstructive pulmonary disease, unspecified: Secondary | ICD-10-CM | POA: Diagnosis not present

## 2019-03-09 DIAGNOSIS — Z950 Presence of cardiac pacemaker: Secondary | ICD-10-CM | POA: Diagnosis not present

## 2019-03-09 DIAGNOSIS — N2581 Secondary hyperparathyroidism of renal origin: Secondary | ICD-10-CM | POA: Diagnosis not present

## 2019-03-09 DIAGNOSIS — I129 Hypertensive chronic kidney disease with stage 1 through stage 4 chronic kidney disease, or unspecified chronic kidney disease: Secondary | ICD-10-CM | POA: Diagnosis not present

## 2019-03-09 DIAGNOSIS — I714 Abdominal aortic aneurysm, without rupture: Secondary | ICD-10-CM | POA: Diagnosis not present

## 2019-03-09 DIAGNOSIS — E039 Hypothyroidism, unspecified: Secondary | ICD-10-CM | POA: Diagnosis not present

## 2019-03-09 DIAGNOSIS — I4891 Unspecified atrial fibrillation: Secondary | ICD-10-CM | POA: Diagnosis not present

## 2019-04-02 ENCOUNTER — Other Ambulatory Visit: Payer: Self-pay | Admitting: Interventional Cardiology

## 2019-04-03 NOTE — Telephone Encounter (Signed)
Eliquis 2.5mg  refill request received; pt is 84 yrs old, wt-87.5kg, Crea-1.84 on 03/09/2019 via [facility], last seen by Tommye Standard on 09/08/18; will send in refill to requested pharmacy.

## 2019-04-04 ENCOUNTER — Other Ambulatory Visit: Payer: Self-pay

## 2019-04-04 ENCOUNTER — Ambulatory Visit (INDEPENDENT_AMBULATORY_CARE_PROVIDER_SITE_OTHER): Payer: Medicare Other | Admitting: *Deleted

## 2019-04-04 DIAGNOSIS — I4430 Unspecified atrioventricular block: Secondary | ICD-10-CM | POA: Diagnosis not present

## 2019-04-04 LAB — CUP PACEART REMOTE DEVICE CHECK
Battery Remaining Longevity: 126 mo
Battery Voltage: 3 V
Brady Statistic AP VP Percent: 0 %
Brady Statistic AP VS Percent: 0 %
Brady Statistic AS VP Percent: 0 %
Brady Statistic AS VS Percent: 0 %
Brady Statistic RA Percent Paced: 0.01 %
Brady Statistic RV Percent Paced: 99.91 %
Date Time Interrogation Session: 20200414121330
Implantable Lead Implant Date: 20181015
Implantable Lead Implant Date: 20181015
Implantable Lead Location: 753859
Implantable Lead Location: 753860
Implantable Lead Model: 5076
Implantable Lead Model: 5076
Implantable Pulse Generator Implant Date: 20181015
Lead Channel Impedance Value: 266 Ohm
Lead Channel Impedance Value: 380 Ohm
Lead Channel Impedance Value: 399 Ohm
Lead Channel Impedance Value: 494 Ohm
Lead Channel Pacing Threshold Amplitude: 0.625 V
Lead Channel Pacing Threshold Pulse Width: 0.4 ms
Lead Channel Sensing Intrinsic Amplitude: 0.5 mV
Lead Channel Sensing Intrinsic Amplitude: 0.5 mV
Lead Channel Sensing Intrinsic Amplitude: 11.125 mV
Lead Channel Sensing Intrinsic Amplitude: 11.125 mV
Lead Channel Setting Pacing Amplitude: 2 V
Lead Channel Setting Pacing Amplitude: 2.5 V
Lead Channel Setting Pacing Pulse Width: 0.4 ms
Lead Channel Setting Sensing Sensitivity: 1.2 mV

## 2019-04-11 ENCOUNTER — Encounter: Payer: Self-pay | Admitting: Cardiology

## 2019-04-11 NOTE — Progress Notes (Signed)
Remote pacemaker transmission.   

## 2019-04-26 ENCOUNTER — Encounter: Payer: Self-pay | Admitting: *Deleted

## 2019-04-26 ENCOUNTER — Telehealth: Payer: Self-pay | Admitting: *Deleted

## 2019-04-26 NOTE — Telephone Encounter (Signed)
   TELEPHONE CALL NOTE  This patient has been deemed a candidate for follow-up tele-health visit to limit community exposure during the Covid-19 pandemic. I spoke with the patient via phone to discuss instructions. This has been outlined on the patient's AVS (dotphrase: [email or web address]). The patient was advised to review the section on consent for treatment as well. The patient will receive a phone call 2-3 days prior to their E-Visit at which time consent will be verbally confirmed.   A Virtual Office Visit appointment type has been scheduled for VIRTUAL VISIT with JILL MCDANIEL, NP, with "VIDEO" or "TELEPHONE" in the appointment notes - patient prefers VIDEO type.  I have either confirmed the patient is active in MyChart or offered to send sign-up link to phone/email via Mychart icon beside patient's photo. Pt uses mychart. Virtual consent sent  Jeanann Lewandowsky, [place] 04/26/2019 11:58 AM

## 2019-04-27 ENCOUNTER — Telehealth: Payer: Self-pay | Admitting: Cardiology

## 2019-04-27 NOTE — Telephone Encounter (Signed)
Spoke with patient who confirmed all demographics. Patient has a smart phone and uses My Chart. Will have vitals ready for visit. °

## 2019-05-01 NOTE — Progress Notes (Signed)
Virtual Visit via Video Note   This visit type was conducted due to national recommendations for restrictions regarding the COVID-19 Pandemic (e.g. social distancing) in an effort to limit this patient's exposure and mitigate transmission in our community.  Due to his co-morbid illnesses, this patient is at least at moderate risk for complications without adequate follow up.  This format is felt to be most appropriate for this patient at this time.  All issues noted in this document were discussed and addressed.  A limited physical exam was performed with this format.  Please refer to the patient's chart for his consent to telehealth for Shepherd [name].   Date:  05/02/2019   ID:  Derek [name], DOB [birth year], MRN [id number]  Patient Location: Home Provider Location: Home  PCP:  Derek Seashore, MD  Cardiologist:  Derek Grooms, MD   Evaluation Performed:  Follow-Up Visit  Chief Complaint: Follow up of CAD, tachybrady syndrome, HTN and HLD, seen for Dr. Tamala [name]   History of Present Illness:    Derek [name] is a 84 y.o. male with a history of CAD s/p CABG, tachybradycardia syndrome s/p PPM, atrial fibrillation, embolic CVA, COPD, HTN, HLD, PVD with carotid disease with intal plan for right CEA however canceled secondary to worsening creatinine, abdominal aortic aneurysm last measuring 4.3 cm, chronic anticoagulation therapy with Eliquis and CKD stage IV.  [name] was last seen by his primary cardiologist, Dr. Tamala [name] on 07/21/2018.  He was noted to be doing well at that time.  He stated that since his PPM placement he had issues with left shoulder blade to mid axillary subcostal region pulling sensations. Otherwise, he had no chest pain, palpitations, syncope or transient neurological symptoms.  He was then seen by EP on 09/08/2018 and had complaints of momentary episodes of feeling dizzy/movement sensation.  The plan was to continue to pursue carotid intervention for his carotid  disease per vascular surgery which was previously canceled secondary to worsening creatinine function 12/2017.  He had asked about switching from Lasix to another diuretic with complaints of penile pain with urination and intermittent groin pain.  He was to follow with his PCP regarding this.  He follows with Dr. Melvyn [name] for his COPD and is noted to sleep chronically with the Derek [name] raised per recommendation from pulmonary medicine secondary to GERD.  Today, he reports that he has been doing well.  He has no complaints of chest pain, palpitations, abnormal weight gain, shortness of breath, PND, orthopnea, dizziness or syncope.  He states that he continues with a salt free diet and has lost approximately 50 pounds since his surgery.  He recently had carotid duplex performed which showed a significant right carotid artery stenosis.  Stenting has been discussed between Derek [name] and his nephrologist given his CKD stage IV however there are no plans to pursue this per patient request as he does not want to damage his kidneys further and is fearful of the possibility of dialysis. His BP is stable today at 139/70.  He reports medication compliance.  Overall he is doing very well.  The patient does not have symptoms concerning for COVID-19 infection (fever, chills, cough, or new shortness of breath).   Past Medical History:  Diagnosis Date   AAA (abdominal aortic aneurysm) (HCC)    Arthritis    Chronic kidney disease    COPD (chronic obstructive pulmonary disease) (HCC)    Coronary artery disease    Headache    Hypertension  Hypothyroidism    Myocardial infarction [facility])    Presence of permanent cardiac pacemaker    Shortness of breath    on exertion;  has productive cough at times   Sleep apnea    states mild . no cpap   Stroke [facility])    Past Surgical History:  Procedure Laterality Date   APPENDECTOMY  1963   CATARACT EXTRACTION     w/ lid rise   CORONARY ARTERY BYPASS GRAFT   2007   EYE SURGERY Right March 2016   Cataract   EYE SURGERY Bilateral May 2016   Eyebrow Lift   EYE SURGERY Left June 2016   Cataract   IR ANGIO INTRA EXTRACRAN SEL COM CAROTID INNOMINATE BILAT MOD SED  09/12/2017   IR ANGIO VERTEBRAL SEL SUBCLAVIAN INNOMINATE UNI R MOD SED  09/12/2017   IR RADIOLOGIST EVAL & MGMT  12/07/2017   KNEE ARTHROSCOPY  02/26/2012   Procedure: ARTHROSCOPY KNEE;  Surgeon: Sydnee Cabal, MD;  Location: [facility];  Service: Orthopedics;  Laterality: Left;  medial menisectomy and chondraplasty   PACEMAKER IMPLANT N/A 10/04/2017   Procedure: PACEMAKER IMPLANT;  Surgeon: Deboraha Sprang, MD;  Location: [facility];  Service: Cardiovascular;  Laterality: N/A;     Current Meds  Medication Sig   acetaminophen (TYLENOL) 500 MG tablet Take 500 mg by mouth every 6 (six) hours as needed for moderate pain or headache.   allopurinol (ZYLOPRIM) 100 MG tablet Take 1 tablet by mouth 2 (two) times a day.   amLODipine-valsartan (EXFORGE) 5-160 MG tablet Take 1 tablet by mouth daily.   aspirin EC 81 MG tablet Take 81 mg by mouth daily after supper.   atorvastatin (LIPITOR) 40 MG tablet Take 40 mg by mouth daily.   Biotin 5000 MCG TABS Take 5,000 mcg by mouth daily.   budesonide-formoterol (SYMBICORT) 80-4.5 MCG/ACT inhaler Take 2 puffs first thing in am and then another 2 puffs about 12 hours later.   ELIQUIS 2.5 MG TABS tablet TAKE 1 TABLET TWICE A DAY   ezetimibe (ZETIA) 10 MG tablet Take 10 mg by mouth daily after supper.    famotidine (PEPCID) 20 MG tablet One at bedtime   furosemide (LASIX) 20 MG tablet TAKE 1 TABLET DAILY AS NEEDED FOR SWELLING   GLUCOSAMINE-CHONDROITIN PO Take 1 tablet by mouth daily.    levothyroxine (SYNTHROID, LEVOTHROID) 88 MCG tablet Take 88 mcg by mouth daily before breakfast.   Magnesium 250 MG TABS Take 250 mg by mouth daily.   Multiple Vitamin (MULTIVITAMIN WITH MINERALS) TABS tablet Take 1 tablet by  mouth daily.   Omega-3 Fatty Acids (FISH OIL) 1000 MG CAPS Take 1 capsule by mouth daily.   OXYGEN Inhale 2 L into the lungs as needed (for  shortness of breath with exertion only).    pregabalin (LYRICA) 50 MG capsule Take 50 mg by mouth 2 (two) times daily.   terazosin (HYTRIN) 5 MG capsule Take 10 mg by mouth 2 (two) times daily.   Turmeric 500 MG CAPS Take 500 mg by mouth daily.   vitamin B-12 (CYANOCOBALAMIN) 1000 MCG tablet Take 1,000 mcg by mouth daily.   [DISCONTINUED] ALLOPURINOL PO Take 50 mg by mouth 2 (two) times a day.    Allergies:   Other; Sulfa antibiotics; Latex; and Tape   Social History   Tobacco Use   Smoking status: Former Smoker    Packs/day: 0.75    Years: 51.00    Pack years: 38.25  Types: Cigarettes    Last attempt to quit: 02/23/2000    Years since quitting: 19.2   Smokeless tobacco: Never Used  Substance Use Topics   Alcohol use: Yes    Alcohol/week: 0.0 - 1.0 standard drinks    Comment: Moderate to little   Drug use: No     Family Hx: The patient's family history includes Diabetes in his brother, mother, and sister; Heart attack in his daughter; Heart disease in his mother; Hyperlipidemia in his brother; Hypertension in his brother, sister, and son.  ROS:   Please see the history of present illness.     All other systems reviewed and are negative.  Prior CV studies:   The following studies were reviewed today:  09/13/17: TTE Study Conclusions - Left ventricle: The cavity size was normal. Wall thickness was increased in a pattern of mild LVH. Indeterminant diastolic function (atrial fibrillation). The estimated ejection fraction was 55%. Wall motion was normal; there were no regional wall motion abnormalities. - Ventricular septum: Mildly D-shaped interventricular septum suggesting RV pressure/volume overload. - Aortic valve: There was no stenosis. - Aorta: Mildly dilated aortic root. Aortic root dimension: 38  mm (ED). - Mitral valve: Mildly calcified annulus. There was trivial regurgitation. - Left atrium: The atrium was moderately dilated. - Right ventricle: The cavity size was mildly dilated. Systolic function was normal. - Right atrium: The atrium was moderately dilated. - Pulmonary arteries: PA peak pressure: 34 mm Hg (S). - Systemic veins: IVC measured 2.5 cm with < 50% respirophasic variation, suggesting RA pressure 15 mmHg. Impressions: - The patient was in atrial fibrillation. Normal LV size with mild LV hypertrophy. EF 55%. Mildly dilated RV with normal systolic function. Mildly D-shaped interventricular septum suggesting RV pressure/volume overload. Dilated IVC suggests elevated RV filling pressure.  Labs/Other Tests and Data Reviewed:    EKG:  An ECG dated 01/05/2018 was personally reviewed today and demonstrated:  V paced  Recent Labs: 08/01/2018: Hemoglobin 12.8; Platelets 94.0   Recent Lipid Panel Lab Results  Component Value Date/Time   CHOL 109 09/12/2017 02:17 AM   TRIG 102 09/12/2017 02:17 AM   HDL 43 09/12/2017 02:17 AM   CHOLHDL 2.5 09/12/2017 02:17 AM   LDLCALC 46 09/12/2017 02:17 AM   Wt Readings from Last 3 Encounters:  05/02/19 190 lb (86.2 kg)  09/08/18 193 lb (87.5 kg)  08/29/18 190 lb 9.6 oz (86.5 kg)    Objective:    Vital Signs:  BP 139/70    Pulse 73    Ht 5\' 7"  (1.702 m)    Wt 190 lb (86.2 kg)    BMI 29.76 kg/m    VITAL SIGNS:  reviewed GEN:  no acute distress EYES:  sclerae anicteric, EOMI - Extraocular Movements Intact RESPIRATORY:  normal respiratory effort, symmetric expansion CARDIOVASCULAR:  no peripheral edema SKIN:  no rash, lesions or ulcers. MUSCULOSKELETAL:  no obvious deformities. NEURO:  alert and oriented x 3, no obvious focal deficit PSYCH:  normal affect  ASSESSMENT & PLAN:    1.  CAD status post CABG: -Denies anginal symptoms -Continue ASA 81, atorvastatin 40, Zetia 10  3.  History of embolic  CVA: -Follows with neurology -On chronic anticoagulation with Eliquis 2.5 mg twice daily  4.  Atrial fibrillation with chronic anticoagulation: -Stable, denies palpitations>>HR 73bpm -Has PPM in place -Continue Eliquis 2.5 mg twice daily -No complaints of acute bleeding in stool or urine  6.  Abdominal aortic aneurysm: -Followed by vascular surgery last measurement 4.3 cm  per vascular duplex   7.  PVD with carotid disease: - Follows with Derek [name] with initial plan for carotid intervention 12/2017 however was canceled secondary to worsening creatinine function -Carotid duplex performed 01/24/2019 with right carotid, 60 to 79% stenosis and left carotid with 1 to 39% stenosis -No future plans for intervention secondary to renal insufficiency and patient preference as he is fearful of the need for dialysis  8.  CKD stage IV: -Follows with outpatient nephrology -Improved, last creatinine, 1.8 on 03/09/2019  9.  History of tachybradycardia syndrome: -Status post PPM 2018 -Last remote transmission 04/04/2019 which was normal.  Battery, lead placement and histograms were within normal limits   COVID-19 Education: The signs and symptoms of COVID-19 were discussed with the patient and how to seek care for testing (follow up with PCP or arrange E-visit).  The importance of social distancing was discussed today.  Time:   Today, I have spent 20 minutes with the patient with telehealth technology discussing the above problems.     Medication Adjustments/Labs and Tests Ordered: Current medicines are reviewed at length with the patient today.  Concerns regarding medicines are outlined above.   Tests Ordered: No orders of the defined types were placed in this encounter.   Medication Changes: No orders of the defined types were placed in this encounter.   Disposition:  Follow up Dr. Tamala [name] in 6 months  Signed, Kathyrn Drown, NP  05/02/2019 11:34 AM    [place]

## 2019-05-02 ENCOUNTER — Encounter: Payer: Self-pay | Admitting: Cardiology

## 2019-05-02 ENCOUNTER — Telehealth (INDEPENDENT_AMBULATORY_CARE_PROVIDER_SITE_OTHER): Payer: Medicare Other | Admitting: Cardiology

## 2019-05-02 ENCOUNTER — Other Ambulatory Visit: Payer: Self-pay

## 2019-05-02 VITALS — BP 139/70 | HR 73 | Ht 67.0 in | Wt 190.0 lb

## 2019-05-02 DIAGNOSIS — I2510 Atherosclerotic heart disease of native coronary artery without angina pectoris: Secondary | ICD-10-CM | POA: Diagnosis not present

## 2019-05-02 DIAGNOSIS — I4891 Unspecified atrial fibrillation: Secondary | ICD-10-CM

## 2019-05-02 DIAGNOSIS — I714 Abdominal aortic aneurysm, without rupture, unspecified: Secondary | ICD-10-CM

## 2019-05-02 DIAGNOSIS — I5032 Chronic diastolic (congestive) heart failure: Secondary | ICD-10-CM

## 2019-05-02 DIAGNOSIS — Z950 Presence of cardiac pacemaker: Secondary | ICD-10-CM

## 2019-05-02 DIAGNOSIS — I6521 Occlusion and stenosis of right carotid artery: Secondary | ICD-10-CM

## 2019-05-02 DIAGNOSIS — N189 Chronic kidney disease, unspecified: Secondary | ICD-10-CM

## 2019-05-02 DIAGNOSIS — I10 Essential (primary) hypertension: Secondary | ICD-10-CM

## 2019-05-02 NOTE — Patient Instructions (Signed)
Medication Instructions:  Your physician recommends that you continue on your current medications as directed. Please refer to the Current Medication list given to you today.  If you need a refill on your cardiac medications before your next appointment, please call your pharmacy.   Lab work: None  If you have labs (blood work) drawn today and your tests are completely normal, you will receive your results only by: Marland Kitchen MyChart Message (if you have MyChart) OR . A paper copy in the mail If you have any lab test that is abnormal or we need to change your treatment, we will call you to review the results.  Testing/Procedures: None  Follow-Up: At [facility], you and your health needs are our priority.  As part of our continuing mission to provide you with exceptional heart care, we have created designated Provider Care Teams.  These Care Teams include your primary Cardiologist (physician) and Advanced Practice Providers (APPs -  Physician Assistants and Nurse Practitioners) who all work together to provide you with the care you need, when you need it. You will need a follow up appointment in 6-9 months.  Please call our office 2 months in advance to schedule this appointment.  You may see Sinclair Grooms, MD or one of the following Advanced Practice Providers on your designated Care Team:   Truitt Merle, NP Cecilie Kicks, NP . Kathyrn Drown, NP  Any Other Special Instructions Will Be Listed Below (If Applicable).

## 2019-06-24 IMAGING — MR MR HEAD W/O CM
5 series · 48 of 48 positions shown · non-contrast
Comparison: CT and CT perfusion 09/11/2017

CLINICAL DATA: Basilar occlusion.  Stroke.

EXAM:
MRI HEAD WITHOUT CONTRAST
TECHNIQUE: Multiplanar, multiecho pulse sequences of the brain and surrounding
structures were obtained without intravenous contrast.

[Series 3: DWI · axial · 3.0mm · 0.94mm/px · z∈[-64,+76]mm · 18 of 100 slices shown (1 of 2)]
[im 1/100]
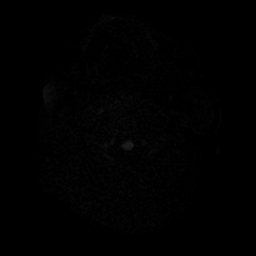
[im 6/100]
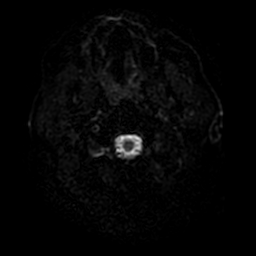
[im 12/100]
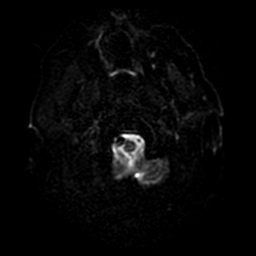
[im 18/100]
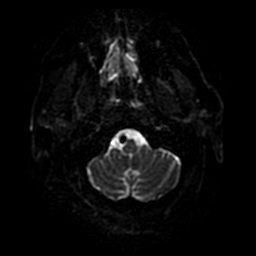
[im 24/100]
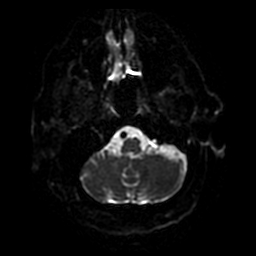
[im 30/100]
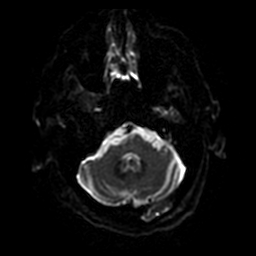
[im 35/100]
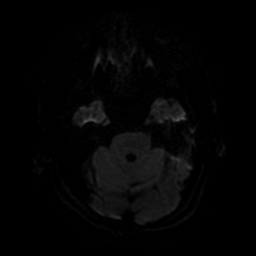
[im 41/100]
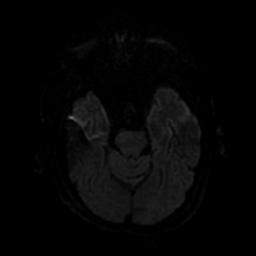
[im 47/100]
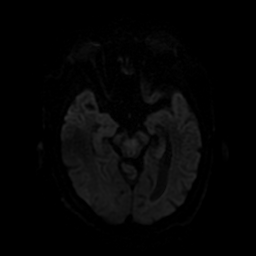
[im 53/100]
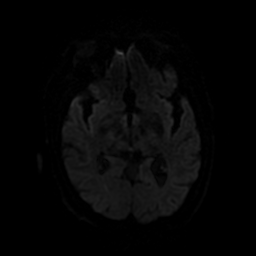
[im 59/100]
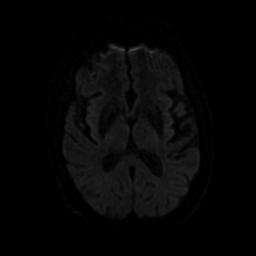
[im 65/100]
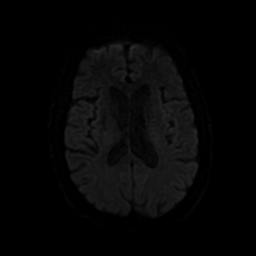
[im 70/100]
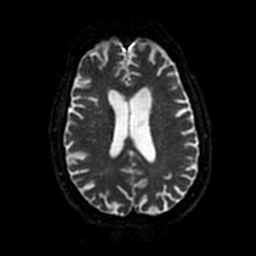
[im 76/100]
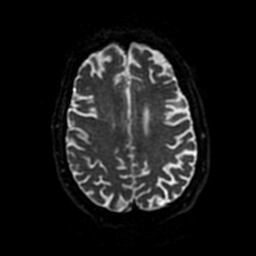
[im 82/100]
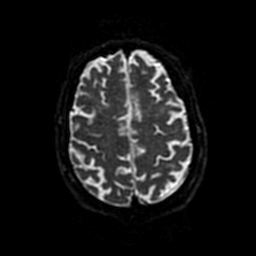
[im 88/100]
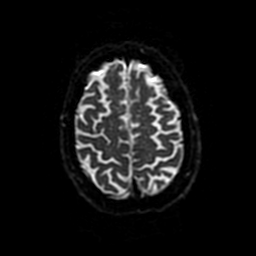
[im 94/100]
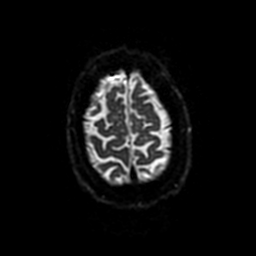
[im 100/100]
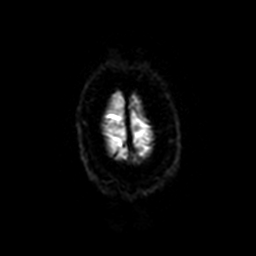

[Series 4: DWI · coronal · 4.0mm · 0.94mm/px · 12 of 74 slices shown (2 of 2)]
[im 1/74]
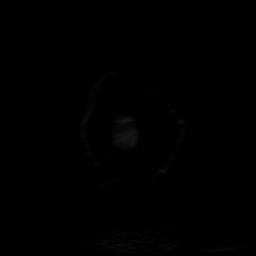
[im 7/74]
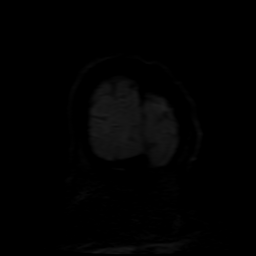
[im 14/74]
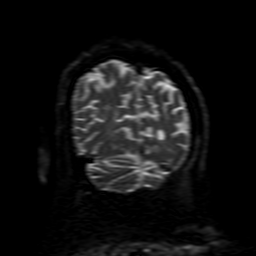
[im 20/74]
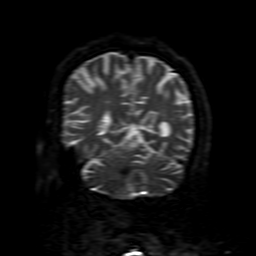
[im 27/74]
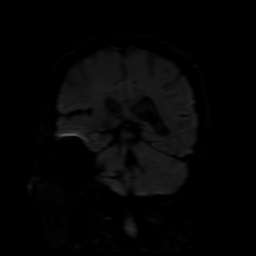
[im 34/74]
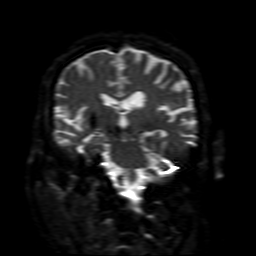
[im 40/74]
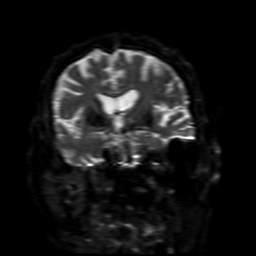
[im 47/74]
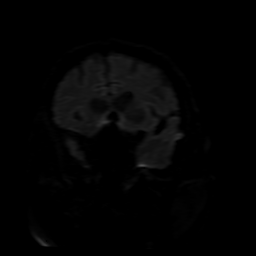
[im 54/74]
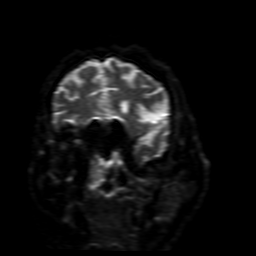
[im 60/74]
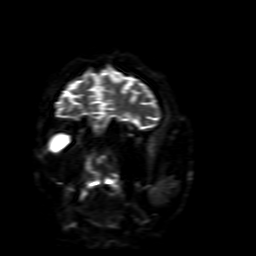
[im 67/74]
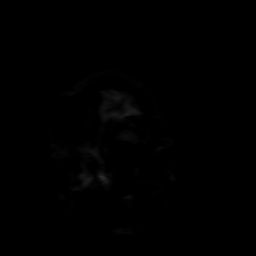
[im 74/74]
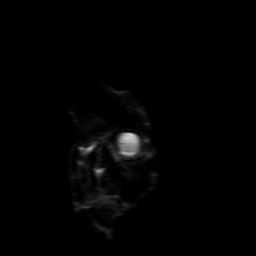

[Series 5: FLAIR · axial · 5.0mm · 0.47mm/px · z∈[-76,+68]mm · 4 of 26 slices shown]
[im 1/26]
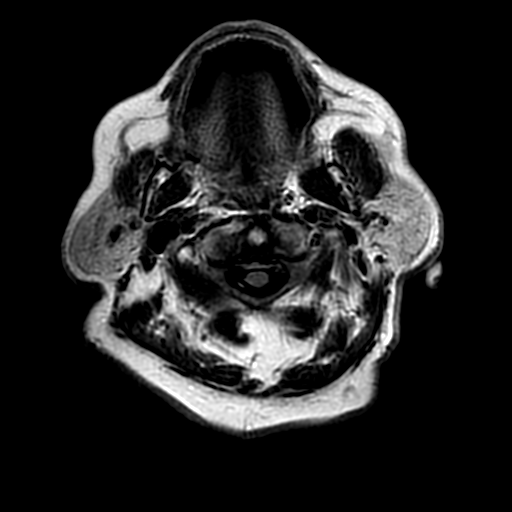
[im 9/26]
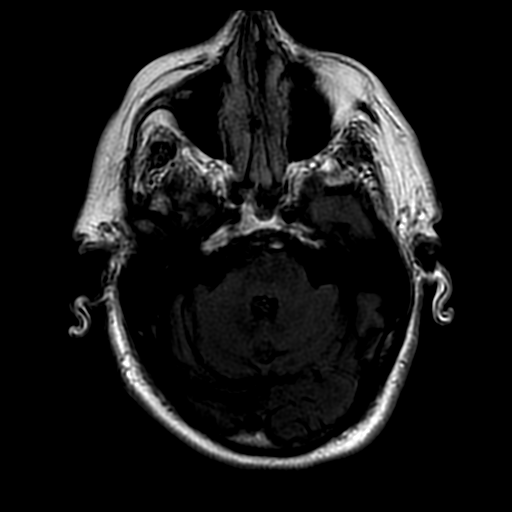
[im 17/26]
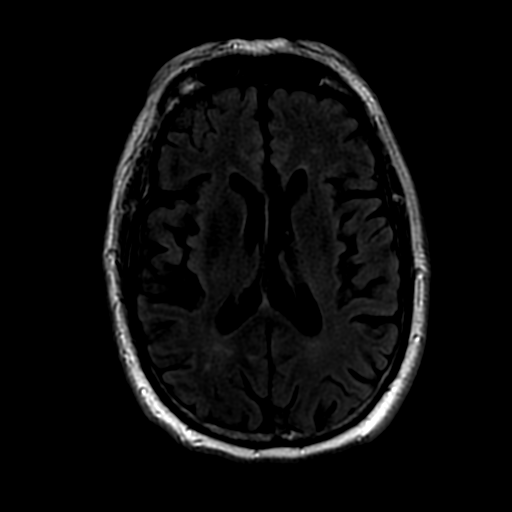
[im 26/26]
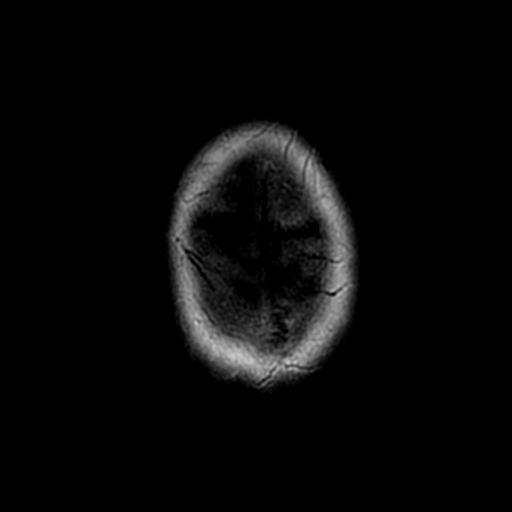

[Series 350: ADC · axial · 3.0mm · 0.94mm/px · z∈[-64,+76]mm · 8 of 50 slices shown (1 of 2)]
[im 1/50]
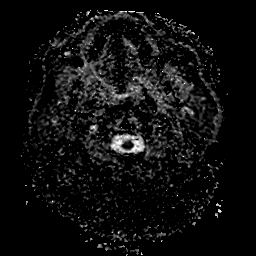
[im 8/50]
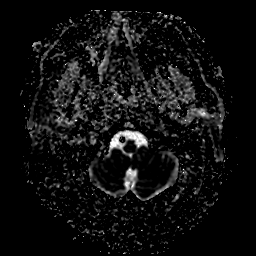
[im 15/50]
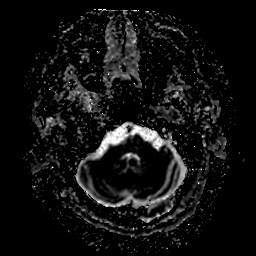
[im 22/50]
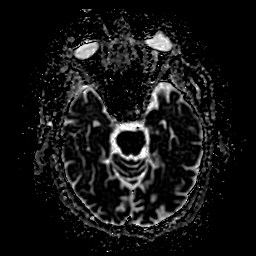
[im 29/50]
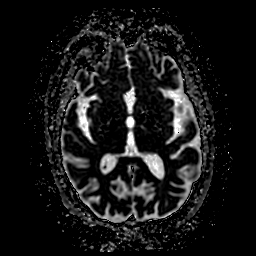
[im 36/50]
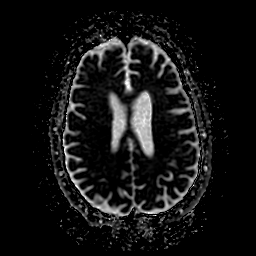
[im 43/50]
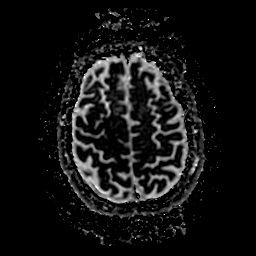
[im 50/50]
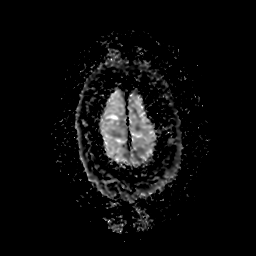

[Series 450: ADC · coronal · 4.0mm · 0.94mm/px · 6 of 37 slices shown (2 of 2)]
[im 1/37]
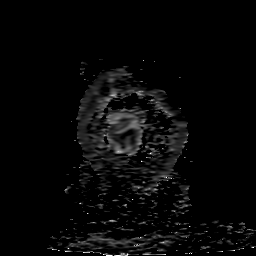
[im 8/37]
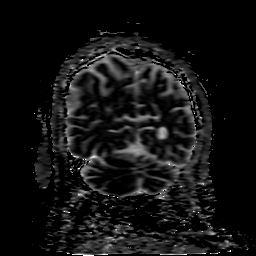
[im 15/37]
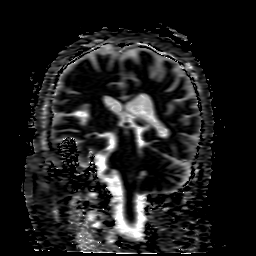
[im 22/37]
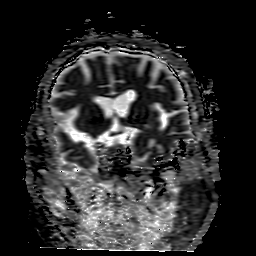
[im 29/37]
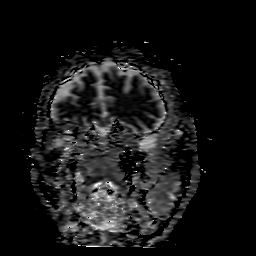
[im 37/37]
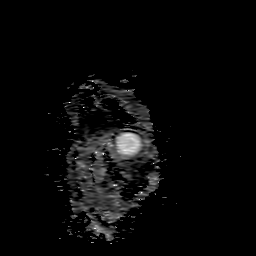

[48 of 48 positions shown; findings below may reference images not displayed]

FINDINGS: Brain: Limited sequences. Diffusion and axial FLAIR imaging only was
performed.

Negative for acute infarct.

Generalized atrophy. Minimal chronic ischemic change in the white
matter.
IMPRESSION: Negative for acute infarct on limited imaging.

These results were called by telephone at the time of interpretation
on 09/11/2017 at [DATE] to Dr. HENDRIKUS HINNEN , who verbally
acknowledged these results.

## 2019-06-24 IMAGING — CT CT HEAD CODE STROKE
3 series · 14 of 47 positions shown, 16 images · non-contrast
Comparison: MRI 01/17/2015

CLINICAL DATA: Code stroke. Focal neuro deficit less than 6 hours.
Right-sided weakness

EXAM:
CT HEAD WITHOUT CONTRAST
TECHNIQUE: Contiguous axial images were obtained from the base of the skull
through the vertex without intravenous contrast.

[Series 3: head 5.0 st · axial · 0.46mm/px · z∈[-134,+16]mm · 8 of 36 slices shown, 10 images]
[im 3/36  brain]
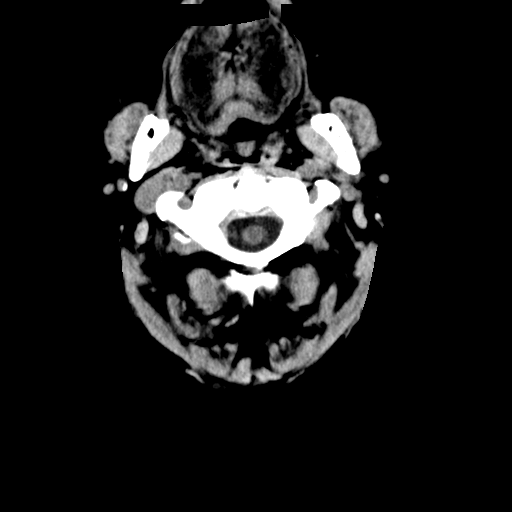
[im 3/36  bone]
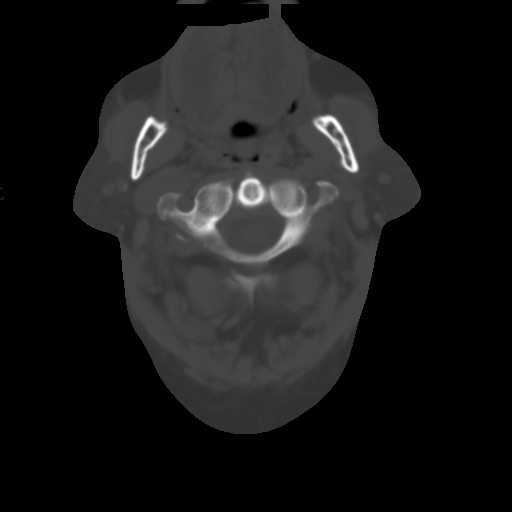
[im 8/36  brain]
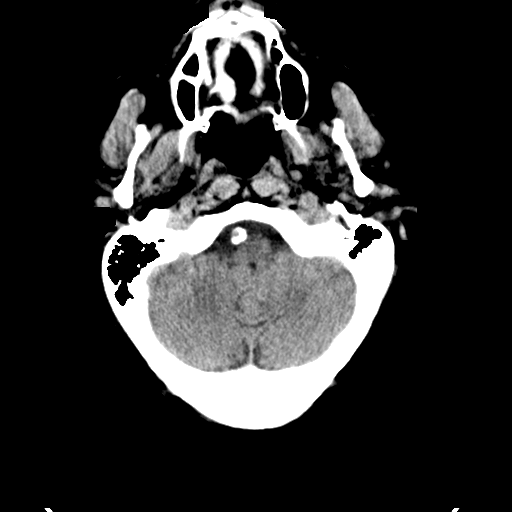
[im 11/36  brain]
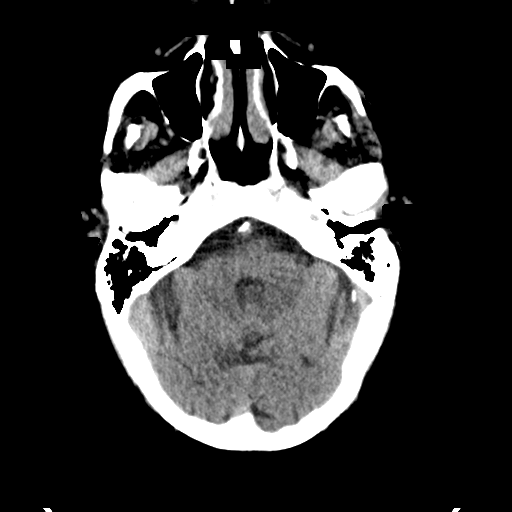
[im 16/36  brain]
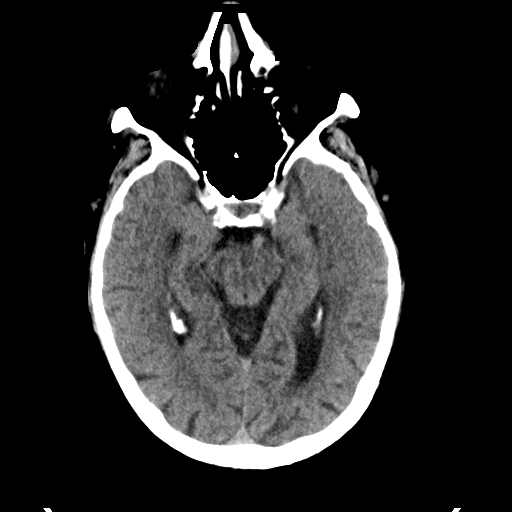
[im 20/36  brain]
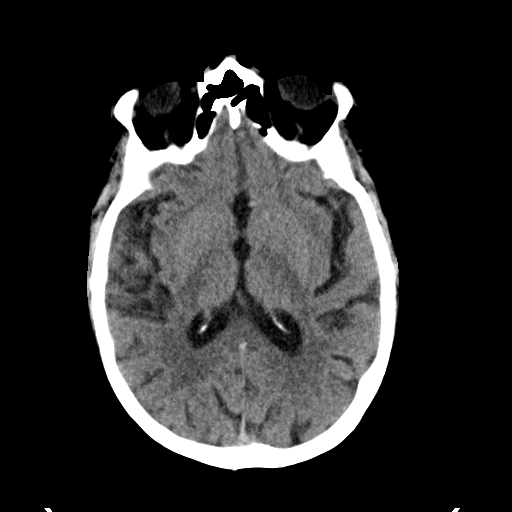
[im 20/36  bone]
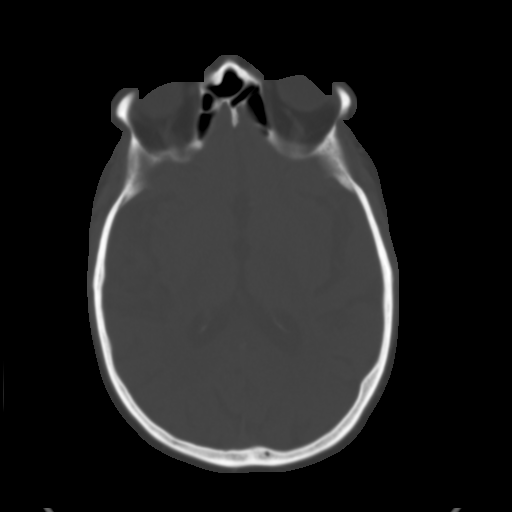
[im 25/36  brain]
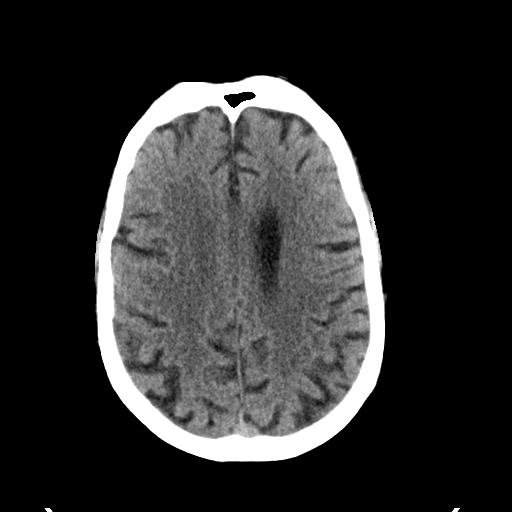
[im 28/36  brain]
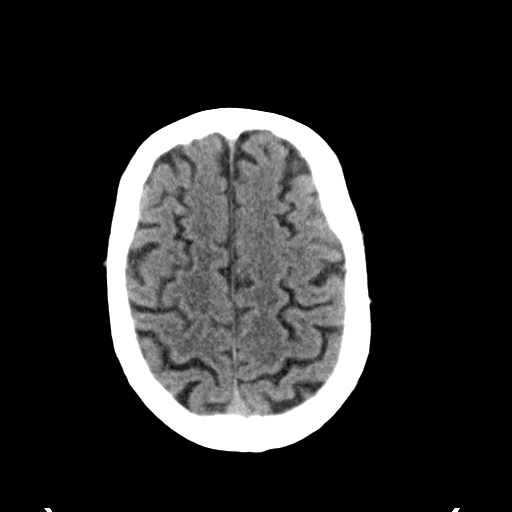
[im 33/36  brain]
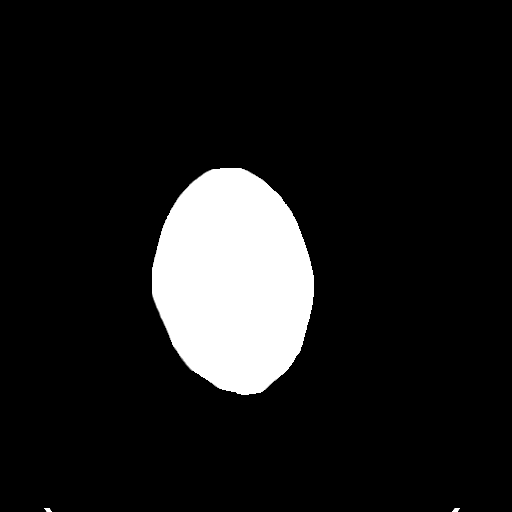

[Series 5: head 3.0 cor st · coronal · 0.34mm/px · 3 of 84 slices shown]
[im 28/84  brain]
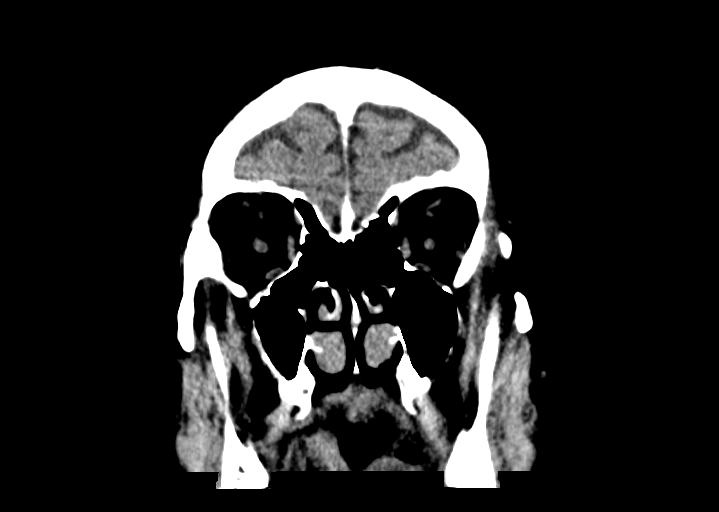
[im 37/84  brain]
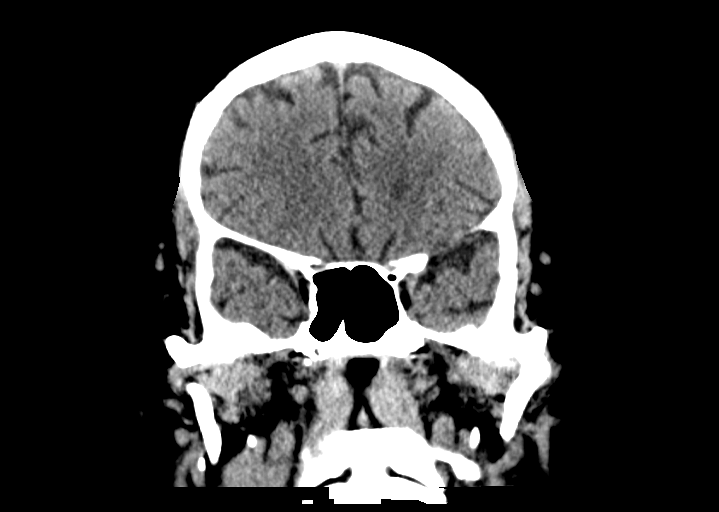
[im 47/84  brain]
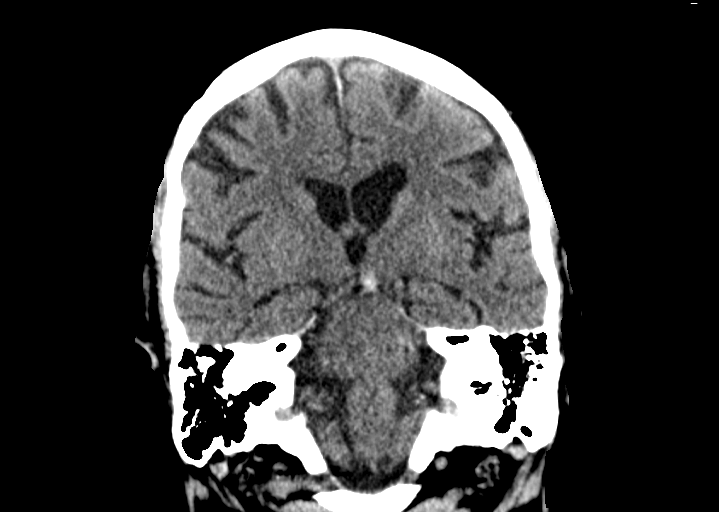

[Series 6: head 3.0 sag st · sagittal · 0.39mm/px · 3 of 65 slices shown]
[im 22/65  brain]
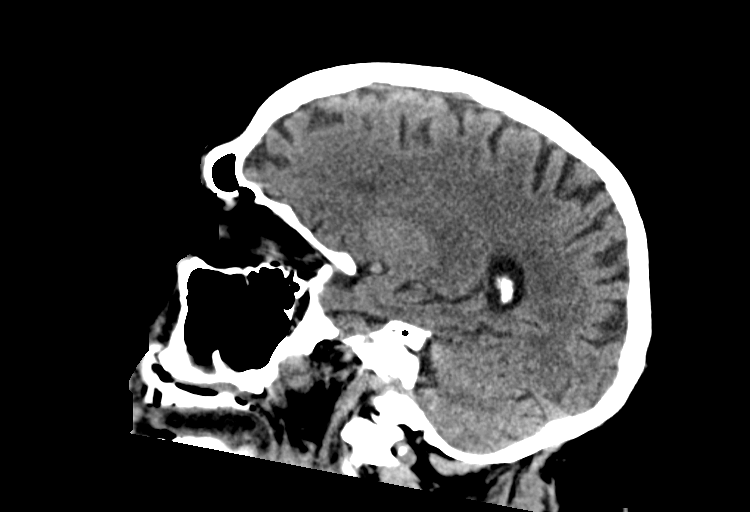
[im 33/65  brain]
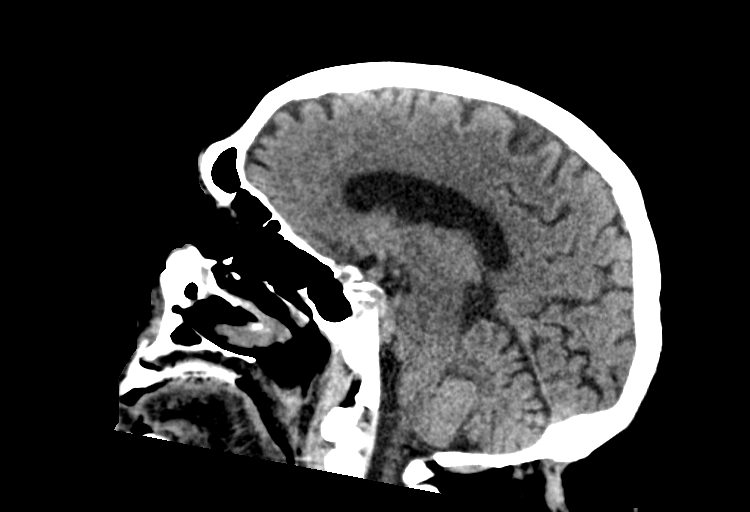
[im 43/65  brain]
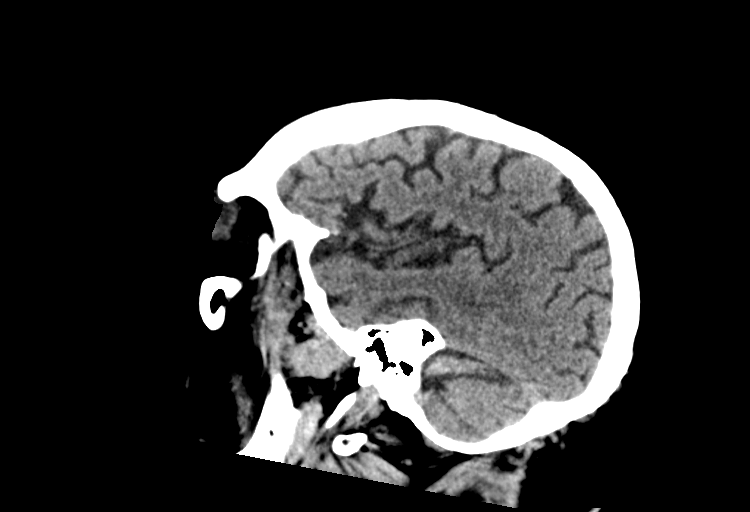

[14 of 47 positions shown; findings below may reference images not displayed]

FINDINGS: Brain: Generalized atrophy. Negative for hydrocephalus. Negative for
acute infarct, hemorrhage, mass lesion.

Vascular: Extensive calcification in the right vertebral artery and
basilar. Calcification also in the cavernous carotid bilaterally.

Hyperdensity distal basilar suggestive of acute thrombosis.

Skull: Negative

Sinuses/Orbits: Mild mucosal edema paranasal sinuses. Bilateral
cataract removal.

Other: None

ASPECTS (Alberta Stroke Program Early CT Score)

- Ganglionic level infarction (caudate, lentiform nuclei, internal
capsule, insula, M1-M3 cortex): 7

- Supraganglionic infarction (M4-M6 cortex): 3

Total score (0-10 with 10 being normal): 10
IMPRESSION: 1. No acute infarct.
2. Hyperdense distal basilar suggestive of acute thrombosis.
3. ASPECTS is 10
4. These results were called by telephone at the time of
interpretation on 09/11/2017 at [DATE] to Dr. Manuel Fonseca, who verbally
acknowledged these results.

## 2019-06-29 DIAGNOSIS — M150 Primary generalized (osteo)arthritis: Secondary | ICD-10-CM | POA: Diagnosis not present

## 2019-06-29 DIAGNOSIS — Z79899 Other long term (current) drug therapy: Secondary | ICD-10-CM | POA: Diagnosis not present

## 2019-06-29 DIAGNOSIS — M1A09X0 Idiopathic chronic gout, multiple sites, without tophus (tophi): Secondary | ICD-10-CM | POA: Diagnosis not present

## 2019-06-29 DIAGNOSIS — N189 Chronic kidney disease, unspecified: Secondary | ICD-10-CM | POA: Diagnosis not present

## 2019-06-29 DIAGNOSIS — D696 Thrombocytopenia, unspecified: Secondary | ICD-10-CM | POA: Diagnosis not present

## 2019-07-04 ENCOUNTER — Ambulatory Visit (INDEPENDENT_AMBULATORY_CARE_PROVIDER_SITE_OTHER): Payer: Medicare Other | Admitting: *Deleted

## 2019-07-04 DIAGNOSIS — I4819 Other persistent atrial fibrillation: Secondary | ICD-10-CM | POA: Diagnosis not present

## 2019-07-04 DIAGNOSIS — R001 Bradycardia, unspecified: Secondary | ICD-10-CM

## 2019-07-05 ENCOUNTER — Telehealth: Payer: Self-pay

## 2019-07-05 LAB — CUP PACEART REMOTE DEVICE CHECK
Battery Remaining Longevity: 122 mo
Battery Voltage: 3 V
Brady Statistic AP VP Percent: 0 %
Brady Statistic AP VS Percent: 0 %
Brady Statistic AS VP Percent: 0 %
Brady Statistic AS VS Percent: 0 %
Brady Statistic RA Percent Paced: 0.02 %
Brady Statistic RV Percent Paced: 99.9 %
Date Time Interrogation Session: 20200715132014
Implantable Lead Implant Date: 20181015
Implantable Lead Implant Date: 20181015
Implantable Lead Location: 753859
Implantable Lead Location: 753860
Implantable Lead Model: 5076
Implantable Lead Model: 5076
Implantable Pulse Generator Implant Date: 20181015
Lead Channel Impedance Value: 266 Ohm
Lead Channel Impedance Value: 361 Ohm
Lead Channel Impedance Value: 361 Ohm
Lead Channel Impedance Value: 475 Ohm
Lead Channel Pacing Threshold Amplitude: 0.5 V
Lead Channel Pacing Threshold Pulse Width: 0.4 ms
Lead Channel Sensing Intrinsic Amplitude: 0.625 mV
Lead Channel Sensing Intrinsic Amplitude: 0.625 mV
Lead Channel Sensing Intrinsic Amplitude: 12.125 mV
Lead Channel Sensing Intrinsic Amplitude: 12.125 mV
Lead Channel Setting Pacing Amplitude: 2 V
Lead Channel Setting Pacing Amplitude: 2.5 V
Lead Channel Setting Pacing Pulse Width: 0.4 ms
Lead Channel Setting Sensing Sensitivity: 1.2 mV

## 2019-07-05 NOTE — Telephone Encounter (Signed)
Spoke with patient to remind of missed remote transmission 

## 2019-07-19 NOTE — Progress Notes (Signed)
Remote pacemaker transmission.   

## 2019-09-07 ENCOUNTER — Other Ambulatory Visit (HOSPITAL_COMMUNITY): Payer: Self-pay | Admitting: Interventional Radiology

## 2019-09-07 ENCOUNTER — Telehealth (HOSPITAL_COMMUNITY): Payer: Self-pay

## 2019-09-07 DIAGNOSIS — I771 Stricture of artery: Secondary | ICD-10-CM

## 2019-09-07 NOTE — Telephone Encounter (Signed)
Called to schedule f/u us carotid, no answer, left vm. AW 

## 2019-09-11 DIAGNOSIS — E782 Mixed hyperlipidemia: Secondary | ICD-10-CM | POA: Diagnosis not present

## 2019-09-11 DIAGNOSIS — R5383 Other fatigue: Secondary | ICD-10-CM | POA: Diagnosis not present

## 2019-09-11 DIAGNOSIS — I2510 Atherosclerotic heart disease of native coronary artery without angina pectoris: Secondary | ICD-10-CM | POA: Diagnosis not present

## 2019-09-11 DIAGNOSIS — I63411 Cerebral infarction due to embolism of right middle cerebral artery: Secondary | ICD-10-CM | POA: Diagnosis not present

## 2019-09-11 DIAGNOSIS — I10 Essential (primary) hypertension: Secondary | ICD-10-CM | POA: Diagnosis not present

## 2019-09-18 DIAGNOSIS — Z7189 Other specified counseling: Secondary | ICD-10-CM | POA: Diagnosis not present

## 2019-09-18 DIAGNOSIS — J449 Chronic obstructive pulmonary disease, unspecified: Secondary | ICD-10-CM | POA: Diagnosis not present

## 2019-09-18 DIAGNOSIS — I6529 Occlusion and stenosis of unspecified carotid artery: Secondary | ICD-10-CM | POA: Diagnosis not present

## 2019-09-18 DIAGNOSIS — D696 Thrombocytopenia, unspecified: Secondary | ICD-10-CM | POA: Diagnosis not present

## 2019-09-18 DIAGNOSIS — I4821 Permanent atrial fibrillation: Secondary | ICD-10-CM | POA: Diagnosis not present

## 2019-09-18 DIAGNOSIS — I63411 Cerebral infarction due to embolism of right middle cerebral artery: Secondary | ICD-10-CM | POA: Diagnosis not present

## 2019-09-18 DIAGNOSIS — I700 Atherosclerosis of aorta: Secondary | ICD-10-CM | POA: Diagnosis not present

## 2019-09-18 DIAGNOSIS — Z23 Encounter for immunization: Secondary | ICD-10-CM | POA: Diagnosis not present

## 2019-09-18 DIAGNOSIS — N184 Chronic kidney disease, stage 4 (severe): Secondary | ICD-10-CM | POA: Diagnosis not present

## 2019-09-19 ENCOUNTER — Other Ambulatory Visit: Payer: Self-pay

## 2019-09-19 ENCOUNTER — Ambulatory Visit (HOSPITAL_COMMUNITY)
Admission: RE | Admit: 2019-09-19 | Discharge: 2019-09-19 | Disposition: A | Discharge status: Home / Self Care | Payer: Medicare Other | Source: Ambulatory Visit | Attending: Interventional Radiology | Admitting: Interventional Radiology

## 2019-09-19 DIAGNOSIS — I771 Stricture of artery: Secondary | ICD-10-CM | POA: Insufficient documentation

## 2019-09-19 NOTE — Progress Notes (Signed)
Carotid duplex has been completed.   Preliminary results in CV Proc.   Abram Sander 09/19/2019 1:30 PM

## 2019-09-29 ENCOUNTER — Telehealth: Payer: Self-pay | Admitting: Internal Medicine

## 2019-09-29 NOTE — Telephone Encounter (Signed)
[place] for support person to accompany

## 2019-09-29 NOTE — Telephone Encounter (Signed)
New Message:      Pt said he will need his support person,Mary to come in with him for his appt on 10-04-19 with Dr Caryl Comes. He says he have problem remembering.

## 2019-10-04 ENCOUNTER — Encounter: Payer: Medicare Other | Admitting: *Deleted

## 2019-10-04 ENCOUNTER — Encounter: Payer: Self-pay | Admitting: Internal Medicine

## 2019-10-04 ENCOUNTER — Ambulatory Visit (INDEPENDENT_AMBULATORY_CARE_PROVIDER_SITE_OTHER): Payer: Medicare Other | Admitting: Internal Medicine

## 2019-10-04 ENCOUNTER — Other Ambulatory Visit: Payer: Self-pay

## 2019-10-04 VITALS — BP 144/96 | HR 79 | Ht 67.0 in | Wt 203.0 lb

## 2019-10-04 DIAGNOSIS — I6521 Occlusion and stenosis of right carotid artery: Secondary | ICD-10-CM | POA: Diagnosis not present

## 2019-10-04 DIAGNOSIS — I4819 Other persistent atrial fibrillation: Secondary | ICD-10-CM

## 2019-10-04 DIAGNOSIS — R001 Bradycardia, unspecified: Secondary | ICD-10-CM | POA: Diagnosis not present

## 2019-10-04 DIAGNOSIS — Z950 Presence of cardiac pacemaker: Secondary | ICD-10-CM | POA: Diagnosis not present

## 2019-10-04 NOTE — Patient Instructions (Signed)
Medication Instructions:  Your physician recommends that you continue on your current medications as directed. Please refer to the Current Medication list given to you today.  Labwork: None ordered.  Testing/Procedures: None ordered.  Follow-Up: Your physician wants you to follow-up in: one year with Dr. Caryl Comes.   You will receive a reminder letter in the mail two months in advance. If you don't receive a letter, please call our office to schedule the follow-up appointment.  Remote monitoring is used to monitor your Pacemaker from home. This monitoring reduces the number of office visits required to check your device to one time per year. It allows [place] to keep an eye on the functioning of your device to ensure it is working properly. You are scheduled for a device check from home on 10/04/2019. You may send your transmission at any time that day. If you have a wireless device, the transmission will be sent automatically. After your physician reviews your transmission, you will receive a postcard with your next transmission date.  Any Other Special Instructions Will Be Listed Below (If Applicable).  If you need a refill on your cardiac medications before your next appointment, please call your pharmacy.

## 2019-10-04 NOTE — Progress Notes (Signed)
Patient Care Team: Merrilee Seashore, MD as PCP - General (Internal Medicine) Belva Crome, MD as PCP - Cardiology (Cardiology) Belva Crome, MD as Consulting Physician (Cardiology)   HPI  Derek [name] is a 84 y.o. male Seen following pacemaker implantation for symptomatic bradycardia in the context of permanent atrial fibrillation.  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  Coronary artery disease with prior bypass surgery    Constant complaints about fullness on the left side following his pacemaker.  Denies dyspnea or chest pain.  Mild edema.  On aspirin.  Neurological notes from 2019 were reviewed and it was confirmed that aspirin was wanted in addition to Eliquis for secondary prevention  Date Cr Hgb  1/19 2.69 13.9   9/20 2.1 12.5   Echocardiogram 9/18 EF normal   Thromboembolic risk factors ( age  -2, HTN-1, TIA/CVA-2, Vasc disease -1) for a CHADSVASc Score of 6   Records and Results Reviewed   Past Medical History:  Diagnosis Date  . AAA (abdominal aortic aneurysm) ([place])   . Arthritis   . Chronic kidney disease   . COPD (chronic obstructive pulmonary disease) ([place])   . Coronary artery disease   . Headache   . Hypertension   . Hypothyroidism   . Myocardial infarction ([place])   . Presence of permanent cardiac pacemaker   . Shortness of breath    on exertion;  has productive cough at times  . Sleep apnea    states mild . no cpap  . Stroke [facility])     Past Surgical History:  Procedure Laterality Date  . APPENDECTOMY  1963  . CATARACT EXTRACTION     w/ lid rise  . CORONARY ARTERY BYPASS GRAFT  2007  . EYE SURGERY Right March 2016   Cataract  . EYE SURGERY Bilateral May 2016   Eyebrow Lift  . EYE SURGERY Left June 2016   Cataract  . IR ANGIO INTRA EXTRACRAN SEL COM CAROTID INNOMINATE BILAT MOD SED  09/12/2017  . IR ANGIO VERTEBRAL SEL SUBCLAVIAN INNOMINATE UNI R MOD SED  09/12/2017  . IR RADIOLOGIST EVAL & MGMT   12/07/2017  . KNEE ARTHROSCOPY  02/26/2012   Procedure: ARTHROSCOPY KNEE;  Surgeon: Sydnee Cabal, MD;  Location: [facility];  Service: Orthopedics;  Laterality: Left;  medial menisectomy and chondraplasty  . PACEMAKER IMPLANT N/A 10/04/2017   Procedure: PACEMAKER IMPLANT;  Surgeon: Deboraha Sprang, MD;  Location: [place] [facility];  Service: Cardiovascular;  Laterality: N/A;    Current Outpatient Medications  Medication Sig Dispense Refill  . acetaminophen (TYLENOL) 500 MG tablet Take 500 mg by mouth every 6 (six) hours as needed for moderate pain or headache.    . allopurinol (ZYLOPRIM) 100 MG tablet Take 1 tablet by mouth 2 (two) times a day.    Marland Kitchen amLODipine-valsartan (EXFORGE) 5-160 MG tablet Take 1 tablet by mouth daily.    Marland Kitchen aspirin EC 81 MG tablet Take 81 mg by mouth daily after supper.    Marland Kitchen atorvastatin (LIPITOR) 40 MG tablet Take 40 mg by mouth daily.    . Biotin 5000 MCG TABS Take 5,000 mcg by mouth daily.    . budesonide-formoterol (SYMBICORT) 80-4.5 MCG/ACT inhaler Take 2 puffs first thing in am and then another 2 puffs about 12 hours later. 3 Inhaler 3  . ELIQUIS 2.5 MG TABS tablet TAKE 1 TABLET TWICE A DAY 180 tablet 3  . ezetimibe (ZETIA) 10  MG tablet Take 10 mg by mouth daily after supper.     . famotidine (PEPCID) 20 MG tablet One at bedtime 30 tablet 11  . furosemide (LASIX) 20 MG tablet TAKE 1 TABLET DAILY AS NEEDED FOR SWELLING 90 tablet 4  . GLUCOSAMINE-CHONDROITIN PO Take 1 tablet by mouth daily.     Marland Kitchen levothyroxine (SYNTHROID, LEVOTHROID) 88 MCG tablet Take 88 mcg by mouth daily before breakfast.    . Magnesium 250 MG TABS Take 250 mg by mouth daily.    . Multiple Vitamin (MULTIVITAMIN WITH MINERALS) TABS tablet Take 1 tablet by mouth daily.    . Omega-3 Fatty Acids (FISH OIL) 1000 MG CAPS Take 1 capsule by mouth daily.    . OXYGEN Inhale 2 L into the lungs as needed (for  shortness of breath with exertion only).     . pregabalin (LYRICA) 50 MG capsule  Take 50 mg by mouth 2 (two) times daily.    Marland Kitchen terazosin (HYTRIN) 5 MG capsule Take 10 mg by mouth 2 (two) times daily.    . Turmeric 500 MG CAPS Take 500 mg by mouth daily.    . vitamin B-12 (CYANOCOBALAMIN) 1000 MCG tablet Take 1,000 mcg by mouth daily.     No current facility-administered medications for this visit.     Allergies  Allergen Reactions  . Other Hives, Dermatitis and Other (See Comments)    -mycins  . Sulfa Antibiotics Rash  . Latex Rash  . Tape Rash and Other (See Comments)    Reaction to adhesive tape - pls use paper tape      Review of Systems negative except from HPI and PMH  Physical Exam BP (!) 144/96   Pulse 79   Ht 5\' 7"  (1.702 m)   Wt 203 lb (92.1 kg)   SpO2 94%   BMI 31.79 kg/m  .Well developed and well nourished in no acute distress HENT normal Neck supple with JVP-flat Clear Device pocket well healed; without hematoma or erythema.  There is no tethering  Regular rate and rhythm, no  murmur Abd-soft with active BS No Clubbing cyanosis tra edema Skin-warm and dry A & Oriented  Grossly normal sensory and motor function  ECG Atrial fib with Vpacing @ 79  Assessment and  Plan Atrial fibrillation-permanent with a slow ventricular response  Chronic diastolic heart failure  Prior stroke  Complete heart block   Chronic kidney disease stage IV  Pacemaker implantation-[employer]  The patient's device was interrogated and the information was fully reviewed.  The device was reprogrammed to go VVIR    Euvolemic continue current meds On Anticoagulation;  No bleeding issues  Improved renal function     have concerns regarding medicines.

## 2019-10-16 ENCOUNTER — Ambulatory Visit (INDEPENDENT_AMBULATORY_CARE_PROVIDER_SITE_OTHER): Payer: Medicare Other | Admitting: *Deleted

## 2019-10-16 DIAGNOSIS — I4819 Other persistent atrial fibrillation: Secondary | ICD-10-CM | POA: Diagnosis not present

## 2019-10-16 DIAGNOSIS — I639 Cerebral infarction, unspecified: Secondary | ICD-10-CM

## 2019-10-17 LAB — CUP PACEART REMOTE DEVICE CHECK
Battery Remaining Longevity: 120 mo
Battery Voltage: 3 V
Brady Statistic AP VP Percent: 0 %
Brady Statistic AP VS Percent: 0 %
Brady Statistic AS VP Percent: 99.85 %
Brady Statistic AS VS Percent: 0.16 %
Brady Statistic RA Percent Paced: 4.01 %
Brady Statistic RV Percent Paced: 99.84 %
Date Time Interrogation Session: 20201026151106
Implantable Lead Implant Date: 20181015
Implantable Lead Implant Date: 20181015
Implantable Lead Location: 753859
Implantable Lead Location: 753860
Implantable Lead Model: 5076
Implantable Lead Model: 5076
Implantable Pulse Generator Implant Date: 20181015
Lead Channel Impedance Value: 266 Ohm
Lead Channel Impedance Value: 380 Ohm
Lead Channel Impedance Value: 380 Ohm
Lead Channel Impedance Value: 494 Ohm
Lead Channel Pacing Threshold Amplitude: 0.625 V
Lead Channel Pacing Threshold Pulse Width: 0.4 ms
Lead Channel Sensing Intrinsic Amplitude: 0.625 mV
Lead Channel Sensing Intrinsic Amplitude: 0.875 mV
Lead Channel Sensing Intrinsic Amplitude: 11.75 mV
Lead Channel Sensing Intrinsic Amplitude: 11.75 mV
Lead Channel Setting Pacing Amplitude: 2.5 V
Lead Channel Setting Pacing Pulse Width: 0.4 ms
Lead Channel Setting Sensing Sensitivity: 1.2 mV

## 2019-10-18 ENCOUNTER — Telehealth: Payer: Self-pay | Admitting: Internal Medicine

## 2019-10-18 ENCOUNTER — Telehealth: Payer: Self-pay | Admitting: Interventional Cardiology

## 2019-10-18 LAB — CUP PACEART INCLINIC DEVICE CHECK
Battery Remaining Longevity: 120 mo
Battery Voltage: 3 V
Brady Statistic AP VP Percent: 0 %
Brady Statistic AP VS Percent: 0 %
Brady Statistic AS VP Percent: 0 %
Brady Statistic AS VS Percent: 0 %
Brady Statistic RA Percent Paced: 0.02 %
Brady Statistic RV Percent Paced: 99.86 %
Date Time Interrogation Session: 20201014183511
Implantable Lead Implant Date: 20181015
Implantable Lead Implant Date: 20181015
Implantable Lead Location: 753859
Implantable Lead Location: 753860
Implantable Lead Model: 5076
Implantable Lead Model: 5076
Implantable Pulse Generator Implant Date: 20181015
Lead Channel Impedance Value: 266 Ohm
Lead Channel Impedance Value: 361 Ohm
Lead Channel Impedance Value: 380 Ohm
Lead Channel Impedance Value: 494 Ohm
Lead Channel Pacing Threshold Amplitude: 0.75 V
Lead Channel Pacing Threshold Pulse Width: 0.4 ms
Lead Channel Sensing Intrinsic Amplitude: 0.875 mV
Lead Channel Setting Pacing Amplitude: 2.5 V
Lead Channel Setting Pacing Pulse Width: 0.4 ms
Lead Channel Setting Sensing Sensitivity: 1.2 mV

## 2019-10-18 NOTE — Telephone Encounter (Signed)
New message:    Patient care giver calling stating that his device is sending codes also patient is not feeling well. Please call care giver back.

## 2019-10-18 NOTE — Telephone Encounter (Signed)
Pt states he is having pains at the ppm site. It feels very sore at the ppm site. Pt states he feels different since being adjusted. The pt states he is having some memory problems since the adjustment. The nurse states she will give him a call back later today.

## 2019-10-18 NOTE — Telephone Encounter (Signed)
Spoke with patient. Explained that he can disregard MyChart notifications for remote transmission appointments. Pt reports Stanton Kidney, his girlfriend, has expressed concerns that he is more confused and forgetful in recent weeks. He has also noted that his PPM site is tender to palpation, denies any other signs/symptoms of infection. He requests that I call Stanton Kidney for additional details and gave verbal permission to speak with her. Will also mail new DPR form for patient to fill out per his request.  Spoke with Stanton Kidney. She reports pt is very resistent about going to ED or MD visits and has a lot of anxiety. Confirms she notices more confusion and forgetfulness in recent weeks. No stroke-like symptoms. Reports pt continues to report "fullness" on his left side and pulling sensation at left shoulder/back when he moves his left arm. Pt tells her he feels like his left chest and left arm is bigger than his right, has had Stanton Kidney and his son measure his chest and arms for any changes--no appreciable difference. These symptoms were discussed at appointment with Dr. Caryl Comes on 10/04/19. Encouraged f/u with PCP to discuss confusion, forgetfulness, and anxiety. Explained transmission from 10/16/19 shows normal PPM function.   Stanton Kidney reports she is also concerned about pt's SOB, feels that it is worse in the past few weeks, mostly with activity. Hx of COPD, appears to be overdue for f/u with pulmonology. Reports some gradual weight gain over the past few months, not clear how much, taking furosemide 20mg  daily. Some BLE edema that comes and goes, no cough, abdominal distention, or other symptoms. Notably, pt refuses to wear his home O2 for comfort as he is afraid of becoming dependent on it, no access to a pulse oximeter. Concerned that pt's hobby of maintaining a bird bath and feeders is exposing him to pathogens from bird feces. Encouraged Stanton Kidney to contact pulmonologist for appointment, she verbalizes understanding. Will also route to  Dr. Caryl Comes and Dr. Tamala Julian for review. Mary in agreement with plan, no further questions at this time.

## 2019-10-18 NOTE — Telephone Encounter (Signed)
No message needed °

## 2019-10-18 NOTE — Telephone Encounter (Signed)
New message  Patient states that he is getting error message on machine that states remote transmission failed. Patient also states that he is having numbness in the area where his pacemaker was put in after the adjustment on 10/04/2019. Please give patient a call back to assist.   Called the DC number twice, no answer.

## 2019-10-23 NOTE — Telephone Encounter (Signed)
Noted    Raquel Sarna Thanks for all your work with him/them  SK

## 2019-11-01 DIAGNOSIS — N2581 Secondary hyperparathyroidism of renal origin: Secondary | ICD-10-CM | POA: Diagnosis not present

## 2019-11-01 DIAGNOSIS — I4891 Unspecified atrial fibrillation: Secondary | ICD-10-CM | POA: Diagnosis not present

## 2019-11-01 DIAGNOSIS — I714 Abdominal aortic aneurysm, without rupture: Secondary | ICD-10-CM | POA: Diagnosis not present

## 2019-11-01 DIAGNOSIS — I639 Cerebral infarction, unspecified: Secondary | ICD-10-CM | POA: Diagnosis not present

## 2019-11-01 DIAGNOSIS — I129 Hypertensive chronic kidney disease with stage 1 through stage 4 chronic kidney disease, or unspecified chronic kidney disease: Secondary | ICD-10-CM | POA: Diagnosis not present

## 2019-11-01 DIAGNOSIS — N184 Chronic kidney disease, stage 4 (severe): Secondary | ICD-10-CM | POA: Diagnosis not present

## 2019-11-01 DIAGNOSIS — Z950 Presence of cardiac pacemaker: Secondary | ICD-10-CM | POA: Diagnosis not present

## 2019-11-01 DIAGNOSIS — I2510 Atherosclerotic heart disease of native coronary artery without angina pectoris: Secondary | ICD-10-CM | POA: Diagnosis not present

## 2019-11-01 DIAGNOSIS — N179 Acute kidney failure, unspecified: Secondary | ICD-10-CM | POA: Diagnosis not present

## 2019-11-01 DIAGNOSIS — D631 Anemia in chronic kidney disease: Secondary | ICD-10-CM | POA: Diagnosis not present

## 2019-11-01 DIAGNOSIS — N189 Chronic kidney disease, unspecified: Secondary | ICD-10-CM | POA: Diagnosis not present

## 2019-11-01 DIAGNOSIS — E039 Hypothyroidism, unspecified: Secondary | ICD-10-CM | POA: Diagnosis not present

## 2019-11-01 DIAGNOSIS — J449 Chronic obstructive pulmonary disease, unspecified: Secondary | ICD-10-CM | POA: Diagnosis not present

## 2019-11-01 DIAGNOSIS — E1122 Type 2 diabetes mellitus with diabetic chronic kidney disease: Secondary | ICD-10-CM | POA: Diagnosis not present

## 2019-11-03 NOTE — Progress Notes (Signed)
Remote pacemaker transmission.   

## 2019-11-14 ENCOUNTER — Other Ambulatory Visit: Payer: Self-pay | Admitting: Internal Medicine

## 2019-11-21 ENCOUNTER — Other Ambulatory Visit: Payer: Self-pay

## 2019-11-21 MED ORDER — BUDESONIDE-FORMOTEROL FUMARATE 80-4.5 MCG/ACT IN AERO: INHALATION_SPRAY | RESPIRATORY_TRACT | 0 refills | Status: DC

## 2019-11-27 ENCOUNTER — Telehealth: Payer: Self-pay | Admitting: Internal Medicine

## 2019-11-27 NOTE — Telephone Encounter (Signed)
Spoke with the pt's daughter, ok per DPR  Pt has upcoming appt tomorrow 11/28/2019 and she wanted to give a confidential update  She states that the pt is not wearing his o2 like he is supposed to and has been having personality changes  She states that she feels like he has changed from not wearing the o2  He is also coughing up "black goo"  [place] states [place] and then his girlfriend is reporting all of this to her  She states that if we could please try to reason with the pt about his o2 she would greatly appreciate it

## 2019-11-28 ENCOUNTER — Encounter: Payer: Self-pay | Admitting: Internal Medicine

## 2019-11-28 ENCOUNTER — Other Ambulatory Visit: Payer: Self-pay

## 2019-11-28 ENCOUNTER — Ambulatory Visit (INDEPENDENT_AMBULATORY_CARE_PROVIDER_SITE_OTHER): Payer: Medicare Other

## 2019-11-28 ENCOUNTER — Ambulatory Visit (INDEPENDENT_AMBULATORY_CARE_PROVIDER_SITE_OTHER): Payer: Medicare Other | Admitting: Internal Medicine

## 2019-11-28 DIAGNOSIS — I639 Cerebral infarction, unspecified: Secondary | ICD-10-CM | POA: Diagnosis not present

## 2019-11-28 DIAGNOSIS — J449 Chronic obstructive pulmonary disease, unspecified: Secondary | ICD-10-CM

## 2019-11-28 DIAGNOSIS — J9611 Chronic respiratory failure with hypoxia: Secondary | ICD-10-CM

## 2019-11-28 NOTE — Patient Instructions (Addendum)
Let me know what the alternatives are that your insurance company prefers over Symbicort 80    Please schedule a follow up visit in 6 months but call sooner if needed

## 2019-11-28 NOTE — Progress Notes (Signed)
Subjective:     Patient ID: Derek [name], male   DOB: [birth year],    MRN: [id number]    Brief patient profile:  84 yowm quit smoking 2001 "smoker's cough" and wt 210  resolved then started cough again 2008/9 assoc doe > rx  spiriva  helped  But cough continued/ worse in winter then in [place] 03/11/16 admit with pna/copd> back to baseline but referred to pulmonary clinic 04/02/2016 by Derek [name] ? Copd severity> proved to have GOLD II criteria 04/17/2016     History of Present Illness  04/02/2016 1st [facility] office visit/ Derek [name]   Chief Complaint  Patient presents with  . Pulmonary Consult    Referred by Derek. Wilson [name]. Pt states 2 wks ago while in [place], he was hospitalized x 5 days for PNA and COPD exacerbation.  He states that today her breathing is "excellent". He has o2 at home but has not needed to use this.   has proair but not using while on spiriva dpi   Has 02 but not using  Off pred x one week s relapse - legs swelled on pred, some better off but not resolved  Not limited by breathing from desired activities  But not aerobically active = MMRC1 = can walk nl pace, flat grade, can't hurry or go uphills or steps s sob rec No change   except ok to take furosemide 20 mg one daily if needed for swelling   Plan A = Automatic = Spiriva one daily each am Plan B = Backup Only use your albuterol(proair) as rescue    01/31/2018  f/u ov/Derek [name] re: gold ii/ no longer using 02  Chief Complaint  Patient presents with  . Follow-up    morning productive cough gray no blood present, no CP, SOB, or Wheezing. Hoarsness and increased mucus production when goes long periods of no speaking.   Dyspnea:  Gradually making progress = MMRC1 = can walk nl pace, flat grade, can't hurry or go uphills or steps s sob  Cough:dry and  assoc with voice raspy on dpi spiriva (va doesn't have smi on formulary) Sleep: ok s 02 / mostly on side rec Try off spiriva for now and see if you don't do just as well with  exercise and less irritation of your throat If worse > spiriva respimat (different device requires coaching) would be best choice      08/01/2018  f/u ov/Derek [name] re:  GOLD II/ breathing better, cough worse  Chief Complaint  Patient presents with  . Follow-up    Breathing is doing well. He no longer has a rescue inhaler.   Dyspnea:  MMRC1 = can walk nl pace, flat grade, can't hurry or go uphills or steps s sob / L leg pain stops first  Cough: more daytime than night/  freq throat clearing/ worse in hot humid weather  X 2 year pattern   Sleeping: on either side/ not on 2  SABA use: not suing  02: has but not using   L lateral cp with cough x 6 m present daily  rec Pantoprazole (protonix) 40 mg   Take  30-60 min before first meal of the day and Pepcid (famotidine)  20 mg one @  bedtime GERD diet  Prednisone 10 mg take  4 each am x 2 days,   2 each am x 2 days,  1 each am x 2 days and stop  Change symbicort to 80 Take 2 puffs first thing in am  and then another 2 puffs about 12 hours later.  Please remember to go to the  x-ray department downstairs in the basement  for your tests - we will call you with the results when they are available. Please schedule a follow up office visit in 4 weeks   08/29/2018  f/u ov/Derek [name] re:  COPD II / cough still same esp out in heat / humidity  Chief Complaint  Patient presents with  . Follow-up    Breathing is unchanged.   Dyspnea:  L leg limiting > sob  Cough: on heat/humidity exp Sleeping: ok at around 40 degrees  SABA use: none 02: has not using Rec Continue symbicort 80 Take 2 puffs first thing in am and then another 2 puffs about 12 hours later.  Work on Gaffer:    Try off protonix (pantoprazole) and pecid (famotidine)    11/28/2019  f/u ov/Derek [name] re: COPD II on symb 80 2bid / pepcid 20 mg hs  Chief Complaint  Patient presents with  . Follow-up    Breathing is unchanged since the last visit and no new co's.   Dyspnea:  L Leg  pain hip to to knee stops him,not sob  Cough:  Rattle a bit / grey phlegm min amt in ams Sleeping: on side/ bed flat/ one pillow / off cpap x years / feels rested, no am ha or hypersomnia  SABA use: none 02: once or twice p heavy exertion, no longer with activity or hs   No obvious day to day or daytime variability or assoc excess/ purulent sputum or mucus plugs or hemoptysis or cp or chest tightness, subjective wheeze or overt sinus or hb symptoms.   Sleeping as above without nocturnal  or early am exacerbation  of respiratory  c/o's or need for noct saba. Also denies any obvious fluctuation of symptoms with weather or environmental changes or other aggravating or alleviating factors except as outlined above   No unusual exposure hx or h/o childhood pna/ asthma or knowledge of premature birth.  Current Allergies, Complete Past Medical History, Past Surgical History, Family History, and Social History were reviewed in [employer] record.  ROS  The following are not active complaints unless bolded Hoarseness, sore throat, dysphagia, dental problems, itching, sneezing,  nasal congestion or discharge of excess mucus or purulent secretions, ear ache,   fever, chills, sweats, unintended wt loss or wt gain, classically pleuritic or exertional cp,  orthopnea pnd or arm/hand swelling  or leg swelling, presyncope, palpitations, abdominal pain, anorexia, nausea, vomiting, diarrhea  or change in bowel habits or change in bladder habits, change in stools or change in urine, dysuria, hematuria,  rash, arthralgias, visual complaints, headache, numbness, weakness or ataxia or problems with walking or coordination,  change in mood or  memory.        Current Meds  Medication Sig  . acetaminophen (TYLENOL) 500 MG tablet Take 500 mg by mouth every 6 (six) hours as needed for moderate pain or headache.  . allopurinol (ZYLOPRIM) 100 MG tablet Take 1 tablet by mouth 2 (two) times a day.  Marland Kitchen  aspirin EC 81 MG tablet Take 81 mg by mouth daily after supper.  Marland Kitchen atorvastatin (LIPITOR) 40 MG tablet Take 40 mg by mouth daily.  . Biotin 5000 MCG TABS Take 5,000 mcg by mouth daily.  . budesonide-formoterol (SYMBICORT) 80-4.5 MCG/ACT inhaler Take 2 puffs first thing in am and then another 2 puffs about 12 hours later.  Marland Kitchen  ELIQUIS 2.5 MG TABS tablet TAKE 1 TABLET TWICE A DAY  . ezetimibe (ZETIA) 10 MG tablet Take 10 mg by mouth daily after supper.   . famotidine (PEPCID) 20 MG tablet One at bedtime  . furosemide (LASIX) 20 MG tablet TAKE 1 TABLET DAILY AS NEEDED FOR SWELLING  . GLUCOSAMINE-CHONDROITIN PO Take 1 tablet by mouth daily.   Marland Kitchen levothyroxine (SYNTHROID, LEVOTHROID) 88 MCG tablet Take 88 mcg by mouth daily before breakfast.  . Magnesium 250 MG TABS Take 250 mg by mouth daily.  . Multiple Vitamin (MULTIVITAMIN WITH MINERALS) TABS tablet Take 1 tablet by mouth daily.  . Omega-3 Fatty Acids (FISH OIL) 1000 MG CAPS Take 1 capsule by mouth daily.  . OXYGEN Inhale 2 L into the lungs as needed (for  shortness of breath with exertion only).   . pregabalin (LYRICA) 50 MG capsule Take 50 mg by mouth 2 (two) times daily.  Marland Kitchen terazosin (HYTRIN) 5 MG capsule Take 10 mg by mouth 2 (two) times daily.  . Turmeric 500 MG CAPS Take 500 mg by mouth daily.  . vitamin B-12 (CYANOCOBALAMIN) 1000 MCG tablet Take 1,000 mcg by mouth daily.                  Objective:   Physical Exam   amb slt hoarse somber wm nad    11/28/2019        203  08/29/2018          190  08/01/2018        192    01/31/2018        195  10/27/2017        203  07/27/2017          221  04/26/2017          226 02/17/2017        231  10/27/2016        234  07/27/2016          230  04/17/2016        232  04/02/16 239 lb 3.4 oz (108.505 kg)  11/06/15 226 lb (102.513 kg)  04/02/15 236 lb (107.049 kg)      Vital signs reviewed - Note on arrival 02 sats  97% on RA     reports Edentulous    HEENT : pt wearing mask not removed for  exam due to covid - 19 concerns.    NECK :  without JVD/Nodes/TM/ nl carotid upstrokes bilaterally   LUNGS: no acc muscle use,  Mild barrel  contour chest wall with bilateral  Distant bs s audible wheeze and  without cough on insp or exp maneuvers  and mild  Hyperresonant  to  percussion bilaterally     CV:  RRR  no s3 or murmur or increase in P2, and trace bil sym LE edema   ABD:  soft and nontender with pos end  insp Hoover's  in the supine position. No bruits or organomegaly appreciated, bowel sounds nl  MS:   Nl gait/  ext warm without deformities, calf tenderness, cyanosis or clubbing No obvious joint restrictions   SKIN: warm and dry without lesions    NEURO:  alert, approp, nl sensorium with  no motor or cerebellar deficits apparent.        CXR PA and Lateral:   11/28/2019 :    I personally reviewed images and agree with radiology impression as follows: No acute abnormalities.       Assessment:

## 2019-11-29 ENCOUNTER — Telehealth: Payer: Self-pay | Admitting: Internal Medicine

## 2019-11-29 ENCOUNTER — Encounter: Payer: Self-pay | Admitting: Internal Medicine

## 2019-11-29 NOTE — Progress Notes (Signed)
Spoke with pt and notified of results per Dr. Wert. Pt verbalized understanding and denied any questions. 

## 2019-11-29 NOTE — Assessment & Plan Note (Signed)
Placed 02 in [place] in May 2017  =  2lpm 24/7 but using prn instead  -   02/17/2017  Patient Saturations on Room Air at Rest = 95% Patient Saturations on Room Air while Ambulating = 88% p one lap = 185 ft mod pace Saturations on 2 Liters of oxygen while Ambulating = 95% - Patient 11/28/2019   Walked RA x one lap =  approx 250 ft -@ avg pace stopped due to hip pain, no sob/ sats 98%     as  Of 11/28/2019 using prn only   No need for amb 02 and refusing noct 02 so no reason to do ONO RA    >>> f/u in 6 m   I had an extended discussion with the patient reviewing all relevant studies completed to date and  lasting 15 to 20 minutes of a 25 minute visit  which included directly observing ambulatory 02 saturation study documented in a/p section of  today's  office note.  Each maintenance medication was reviewed in detail including most importantly the difference between maintenance and prns and under what circumstances the prns are to be triggered using an action plan format that is not reflected in the computer generated alphabetically organized AVS.     Please see AVS for specific instructions unique to this visit that I personally wrote and verbalized to the the pt in detail and then reviewed with pt  by my nurse highlighting any changes in therapy recommended at today's visit .

## 2019-11-29 NOTE — Assessment & Plan Note (Signed)
Quit smoking 2001  - PFT's  04/17/2016  FEV1 2.00 (72 % ) ratio 60  p no % improvement from saba p no prior to study with DLCO  38 % corrects to 50 % for alv volume   - 7//2017 worse on spiriva monotherapy > added advair 100 > ov 07/27/2016 changed to advair 250 - 07/27/2017  change advair to symb 160 2bid due to hoarseness / continue spiriva dpi as gets it free from [place] - 01/31/2018 try off spiriva dpi due to throat irritation  - 08/01/2018 try lower symb to 80 2bid due to cough/ throat clearing  - 08/29/2018  After extensive coaching inhaler device,  effectiveness =    90%   Overall improved s problems with mouth irritation on sym 80 2 bid without limiting sob or tendency to aecopd > no changes needed though insurance company limiting access to symbicort.  Advised:  formulary restrictions will be an ongoing challenge for the forseable future and I would be happy to pick an alternative if the pt will first  provide me a list of them -  pt  will need to return here for training for any new device that is required eg dpi vs hfa vs respimat.

## 2019-11-29 NOTE — Telephone Encounter (Signed)
Dr Melvyn Novas is aware of this and o2 was addressed at [facility]

## 2019-11-29 NOTE — Telephone Encounter (Signed)
Discussed with daughter / see ov 11/28/2019   rec continue to monitor sats with acitivty with goal of > 90% which he easily met at [facility]

## 2019-11-29 NOTE — Telephone Encounter (Signed)
Spoke with pt's daughter, Mickel Baas. She would like to speak to Dr. Melvyn Novas in regards to the pt's visit yesterday. Mickel Baas is listed on the pt's DPR. She will be in a meeting from 12-1pm.  Dr. Melvyn Novas - please advise. Thanks.

## 2019-12-01 ENCOUNTER — Telehealth: Payer: Self-pay | Admitting: Internal Medicine

## 2019-12-01 NOTE — Telephone Encounter (Signed)
Form faxed to [place] on this from [facility]  Per MW- switch to advair hfa 2 puffs bid   Called to let pt know before I sent rx  LMTCB

## 2019-12-04 MED ORDER — ADVAIR HFA 115-21 MCG/ACT IN AERO: 2.0000 | INHALATION_SPRAY | Freq: Two times a day (BID) | RESPIRATORY_TRACT | 12 refills | Status: DC

## 2019-12-04 NOTE — Telephone Encounter (Signed)
ATC patient, unable to reach, rang fast busy after 8 rings.

## 2019-12-04 NOTE — Telephone Encounter (Signed)
advair hfa 115  Take 2 puffs first thing in am and then another 2 puffs about 12 hours later.

## 2019-12-04 NOTE — Telephone Encounter (Signed)
Patient is returning the call. CB is [phone].

## 2019-12-04 NOTE — Telephone Encounter (Signed)
Spoke with patient. [facility] sent [facility].  Explained inhaler instructions to patient. He stated he knew that from before. Nothing further needed at this time.

## 2019-12-04 NOTE — Telephone Encounter (Signed)
Spoke with pt, advised message from [facility]. He stated he saw Dr. Melvyn Novas and he is ok with switching to Advair. Dr. Melvyn Novas please clarify which strength of Advair you would like pt to use.

## 2020-01-01 DIAGNOSIS — M150 Primary generalized (osteo)arthritis: Secondary | ICD-10-CM | POA: Diagnosis not present

## 2020-01-01 DIAGNOSIS — M1A09X0 Idiopathic chronic gout, multiple sites, without tophus (tophi): Secondary | ICD-10-CM | POA: Diagnosis not present

## 2020-01-01 DIAGNOSIS — D696 Thrombocytopenia, unspecified: Secondary | ICD-10-CM | POA: Diagnosis not present

## 2020-01-01 DIAGNOSIS — Z79899 Other long term (current) drug therapy: Secondary | ICD-10-CM | POA: Diagnosis not present

## 2020-01-01 DIAGNOSIS — N189 Chronic kidney disease, unspecified: Secondary | ICD-10-CM | POA: Diagnosis not present

## 2020-01-14 ENCOUNTER — Other Ambulatory Visit: Payer: Self-pay | Admitting: Internal Medicine

## 2020-01-17 ENCOUNTER — Telehealth: Payer: Self-pay | Admitting: Internal Medicine

## 2020-01-17 NOTE — Telephone Encounter (Signed)
I called the pt to help him send a manual transmission but he did not answer. I left my direct office number to call to get help sending the transmission.

## 2020-01-17 NOTE — Telephone Encounter (Signed)
Patient needs help sending in a transmission. Will only be available for the next 10 minutes

## 2020-01-18 NOTE — Telephone Encounter (Signed)
LMOM

## 2020-01-19 ENCOUNTER — Ambulatory Visit (INDEPENDENT_AMBULATORY_CARE_PROVIDER_SITE_OTHER): Payer: Medicare Other | Admitting: *Deleted

## 2020-01-19 DIAGNOSIS — I4819 Other persistent atrial fibrillation: Secondary | ICD-10-CM | POA: Diagnosis not present

## 2020-01-19 NOTE — Telephone Encounter (Signed)
Spoke with patient. He recently had an update to his IOS and is trying to setup [facility] app. Assisted patient with re-installation and initiated transmission (overdue from 01/16/20). Advised will call back on Monday to advise if transmission was received if it hasn't come through by the end of the day today. Patient verbalizes understanding and agreement with plan.

## 2020-01-21 LAB — CUP PACEART REMOTE DEVICE CHECK
Battery Remaining Longevity: 115 mo
Battery Voltage: 3 V
Brady Statistic AP VP Percent: 0 %
Brady Statistic AP VS Percent: 0 %
Brady Statistic AS VP Percent: 99.87 %
Brady Statistic AS VS Percent: 0.13 %
Brady Statistic RA Percent Paced: 0 %
Brady Statistic RV Percent Paced: 99.87 %
Date Time Interrogation Session: 20210129160628
Implantable Lead Implant Date: 20181015
Implantable Lead Implant Date: 20181015
Implantable Lead Location: 753859
Implantable Lead Location: 753860
Implantable Lead Model: 5076
Implantable Lead Model: 5076
Implantable Pulse Generator Implant Date: 20181015
Lead Channel Impedance Value: 266 Ohm
Lead Channel Impedance Value: 380 Ohm
Lead Channel Impedance Value: 380 Ohm
Lead Channel Impedance Value: 475 Ohm
Lead Channel Pacing Threshold Amplitude: 0.625 V
Lead Channel Pacing Threshold Pulse Width: 0.4 ms
Lead Channel Sensing Intrinsic Amplitude: 0.625 mV
Lead Channel Sensing Intrinsic Amplitude: 0.875 mV
Lead Channel Sensing Intrinsic Amplitude: 12.75 mV
Lead Channel Sensing Intrinsic Amplitude: 12.75 mV
Lead Channel Setting Pacing Amplitude: 2.5 V
Lead Channel Setting Pacing Pulse Width: 0.4 ms
Lead Channel Setting Sensing Sensitivity: 1.2 mV

## 2020-01-23 NOTE — Telephone Encounter (Signed)
Spoke with patient. Advised transmission was received on 01/19/20 and showed normal PPM function. Advised next transmission should be automatic. Pt verbalizes understanding and denies additional questions or concerns at this time.

## 2020-03-11 DIAGNOSIS — Z0000 Encounter for general adult medical examination without abnormal findings: Secondary | ICD-10-CM | POA: Diagnosis not present

## 2020-03-11 DIAGNOSIS — N184 Chronic kidney disease, stage 4 (severe): Secondary | ICD-10-CM | POA: Diagnosis not present

## 2020-03-11 DIAGNOSIS — I4821 Permanent atrial fibrillation: Secondary | ICD-10-CM | POA: Diagnosis not present

## 2020-03-11 DIAGNOSIS — E782 Mixed hyperlipidemia: Secondary | ICD-10-CM | POA: Diagnosis not present

## 2020-03-11 DIAGNOSIS — I10 Essential (primary) hypertension: Secondary | ICD-10-CM | POA: Diagnosis not present

## 2020-03-11 DIAGNOSIS — D696 Thrombocytopenia, unspecified: Secondary | ICD-10-CM | POA: Diagnosis not present

## 2020-03-11 DIAGNOSIS — I700 Atherosclerosis of aorta: Secondary | ICD-10-CM | POA: Diagnosis not present

## 2020-03-11 DIAGNOSIS — I63411 Cerebral infarction due to embolism of right middle cerebral artery: Secondary | ICD-10-CM | POA: Diagnosis not present

## 2020-03-20 DIAGNOSIS — I4821 Permanent atrial fibrillation: Secondary | ICD-10-CM | POA: Diagnosis not present

## 2020-03-20 DIAGNOSIS — I6529 Occlusion and stenosis of unspecified carotid artery: Secondary | ICD-10-CM | POA: Diagnosis not present

## 2020-03-20 DIAGNOSIS — J449 Chronic obstructive pulmonary disease, unspecified: Secondary | ICD-10-CM | POA: Diagnosis not present

## 2020-03-20 DIAGNOSIS — L989 Disorder of the skin and subcutaneous tissue, unspecified: Secondary | ICD-10-CM | POA: Diagnosis not present

## 2020-03-20 DIAGNOSIS — I700 Atherosclerosis of aorta: Secondary | ICD-10-CM | POA: Diagnosis not present

## 2020-03-20 DIAGNOSIS — D696 Thrombocytopenia, unspecified: Secondary | ICD-10-CM | POA: Diagnosis not present

## 2020-03-20 DIAGNOSIS — I63411 Cerebral infarction due to embolism of right middle cerebral artery: Secondary | ICD-10-CM | POA: Diagnosis not present

## 2020-03-20 DIAGNOSIS — N184 Chronic kidney disease, stage 4 (severe): Secondary | ICD-10-CM | POA: Diagnosis not present

## 2020-04-12 DIAGNOSIS — L820 Inflamed seborrheic keratosis: Secondary | ICD-10-CM | POA: Diagnosis not present

## 2020-04-12 DIAGNOSIS — X32XXXD Exposure to sunlight, subsequent encounter: Secondary | ICD-10-CM | POA: Diagnosis not present

## 2020-04-12 DIAGNOSIS — M6740 Ganglion, unspecified site: Secondary | ICD-10-CM | POA: Diagnosis not present

## 2020-04-12 DIAGNOSIS — L570 Actinic keratosis: Secondary | ICD-10-CM | POA: Diagnosis not present

## 2020-04-19 ENCOUNTER — Telehealth: Payer: Self-pay

## 2020-04-19 ENCOUNTER — Ambulatory Visit (INDEPENDENT_AMBULATORY_CARE_PROVIDER_SITE_OTHER): Payer: Medicare Other | Admitting: *Deleted

## 2020-04-19 DIAGNOSIS — I4819 Other persistent atrial fibrillation: Secondary | ICD-10-CM | POA: Diagnosis not present

## 2020-04-19 LAB — CUP PACEART REMOTE DEVICE CHECK
Battery Remaining Longevity: 112 mo
Battery Voltage: 3 V
Brady Statistic AP VP Percent: 0 %
Brady Statistic AP VS Percent: 0 %
Brady Statistic AS VP Percent: 99.86 %
Brady Statistic AS VS Percent: 0.14 %
Brady Statistic RA Percent Paced: 0 %
Brady Statistic RV Percent Paced: 99.86 %
Date Time Interrogation Session: 20210430153449
Implantable Lead Implant Date: 20181015
Implantable Lead Implant Date: 20181015
Implantable Lead Location: 753859
Implantable Lead Location: 753860
Implantable Lead Model: 5076
Implantable Lead Model: 5076
Implantable Pulse Generator Implant Date: 20181015
Lead Channel Impedance Value: 266 Ohm
Lead Channel Impedance Value: 380 Ohm
Lead Channel Impedance Value: 380 Ohm
Lead Channel Impedance Value: 475 Ohm
Lead Channel Pacing Threshold Amplitude: 0.625 V
Lead Channel Pacing Threshold Pulse Width: 0.4 ms
Lead Channel Sensing Intrinsic Amplitude: 0.625 mV
Lead Channel Sensing Intrinsic Amplitude: 0.875 mV
Lead Channel Sensing Intrinsic Amplitude: 11.5 mV
Lead Channel Sensing Intrinsic Amplitude: 11.5 mV
Lead Channel Setting Pacing Amplitude: 2.5 V
Lead Channel Setting Pacing Pulse Width: 0.4 ms
Lead Channel Setting Sensing Sensitivity: 1.2 mV

## 2020-04-19 NOTE — Telephone Encounter (Signed)
The pt sent his transmission. I told him if I do not see it in the system by 3 pm I will give him a call.

## 2020-04-19 NOTE — Progress Notes (Signed)
PPM Remote  

## 2020-04-19 NOTE — Telephone Encounter (Signed)
Left message for patient to remind of missed remote transmission.  

## 2020-04-19 NOTE — Telephone Encounter (Signed)
I spoke with the pt partner and gave her the number to [employer] to get additional help.

## 2020-05-10 ENCOUNTER — Other Ambulatory Visit: Payer: Self-pay

## 2020-05-10 ENCOUNTER — Observation Stay (HOSPITAL_COMMUNITY)
Admission: EM | Admit: 2020-05-10 | Discharge: 2020-05-11 | Disposition: A | Discharge status: Home / Self Care | Payer: Medicare Other | Attending: Internal Medicine | Admitting: Internal Medicine

## 2020-05-10 ENCOUNTER — Encounter (HOSPITAL_COMMUNITY): Payer: Self-pay | Admitting: Internal Medicine

## 2020-05-10 ENCOUNTER — Emergency Department (HOSPITAL_COMMUNITY): Payer: Medicare Other

## 2020-05-10 DIAGNOSIS — Z87891 Personal history of nicotine dependence: Secondary | ICD-10-CM | POA: Insufficient documentation

## 2020-05-10 DIAGNOSIS — I6523 Occlusion and stenosis of bilateral carotid arteries: Secondary | ICD-10-CM | POA: Diagnosis not present

## 2020-05-10 DIAGNOSIS — G459 Transient cerebral ischemic attack, unspecified: Secondary | ICD-10-CM | POA: Diagnosis not present

## 2020-05-10 DIAGNOSIS — Z91040 Latex allergy status: Secondary | ICD-10-CM | POA: Diagnosis not present

## 2020-05-10 DIAGNOSIS — Z7901 Long term (current) use of anticoagulants: Secondary | ICD-10-CM | POA: Insufficient documentation

## 2020-05-10 DIAGNOSIS — Z8673 Personal history of transient ischemic attack (TIA), and cerebral infarction without residual deficits: Secondary | ICD-10-CM | POA: Insufficient documentation

## 2020-05-10 DIAGNOSIS — R29810 Facial weakness: Secondary | ICD-10-CM | POA: Diagnosis not present

## 2020-05-10 DIAGNOSIS — I4510 Unspecified right bundle-branch block: Secondary | ICD-10-CM | POA: Diagnosis not present

## 2020-05-10 DIAGNOSIS — I252 Old myocardial infarction: Secondary | ICD-10-CM | POA: Diagnosis not present

## 2020-05-10 DIAGNOSIS — E039 Hypothyroidism, unspecified: Secondary | ICD-10-CM | POA: Diagnosis not present

## 2020-05-10 DIAGNOSIS — Z951 Presence of aortocoronary bypass graft: Secondary | ICD-10-CM | POA: Diagnosis not present

## 2020-05-10 DIAGNOSIS — I10 Essential (primary) hypertension: Secondary | ICD-10-CM | POA: Diagnosis not present

## 2020-05-10 DIAGNOSIS — R2990 Unspecified symptoms and signs involving the nervous system: Secondary | ICD-10-CM

## 2020-05-10 DIAGNOSIS — I2510 Atherosclerotic heart disease of native coronary artery without angina pectoris: Secondary | ICD-10-CM | POA: Diagnosis not present

## 2020-05-10 DIAGNOSIS — N189 Chronic kidney disease, unspecified: Secondary | ICD-10-CM | POA: Diagnosis not present

## 2020-05-10 DIAGNOSIS — J984 Other disorders of lung: Secondary | ICD-10-CM | POA: Diagnosis not present

## 2020-05-10 DIAGNOSIS — Z20822 Contact with and (suspected) exposure to covid-19: Secondary | ICD-10-CM | POA: Diagnosis not present

## 2020-05-10 DIAGNOSIS — Z950 Presence of cardiac pacemaker: Secondary | ICD-10-CM | POA: Insufficient documentation

## 2020-05-10 DIAGNOSIS — I129 Hypertensive chronic kidney disease with stage 1 through stage 4 chronic kidney disease, or unspecified chronic kidney disease: Secondary | ICD-10-CM | POA: Diagnosis not present

## 2020-05-10 DIAGNOSIS — I639 Cerebral infarction, unspecified: Secondary | ICD-10-CM | POA: Diagnosis not present

## 2020-05-10 DIAGNOSIS — Z7982 Long term (current) use of aspirin: Secondary | ICD-10-CM | POA: Insufficient documentation

## 2020-05-10 DIAGNOSIS — I6521 Occlusion and stenosis of right carotid artery: Secondary | ICD-10-CM | POA: Diagnosis not present

## 2020-05-10 DIAGNOSIS — R531 Weakness: Secondary | ICD-10-CM | POA: Diagnosis not present

## 2020-05-10 DIAGNOSIS — R4701 Aphasia: Secondary | ICD-10-CM | POA: Diagnosis not present

## 2020-05-10 DIAGNOSIS — R0902 Hypoxemia: Secondary | ICD-10-CM | POA: Diagnosis not present

## 2020-05-10 LAB — COMPREHENSIVE METABOLIC PANEL
ALT: 22 U/L (ref 0–44)
ALT: 22 U/L (ref 0–44)
AST: 31 U/L (ref 15–41)
AST: 35 U/L (ref 15–41)
Albumin: 3.6 g/dL (ref 3.5–5.0)
Albumin: 3.8 g/dL (ref 3.5–5.0)
Alkaline Phosphatase: 75 U/L (ref 38–126)
Alkaline Phosphatase: 78 U/L (ref 38–126)
Anion gap: 12 (ref 5–15)
Anion gap: 13 (ref 5–15)
BUN: 39 mg/dL — ABNORMAL HIGH (ref 8–23)
BUN: 39 mg/dL — ABNORMAL HIGH (ref 8–23)
CO2: 22 mmol/L (ref 22–32)
CO2: 24 mmol/L (ref 22–32)
Calcium: 9.1 mg/dL (ref 8.9–10.3)
Calcium: 9.2 mg/dL (ref 8.9–10.3)
Chloride: 107 mmol/L (ref 98–111)
Chloride: 104 mmol/L (ref 98–111)
Creatinine, Ser: 1.98 mg/dL — ABNORMAL HIGH (ref 0.61–1.24)
Creatinine, Ser: 1.86 mg/dL — ABNORMAL HIGH (ref 0.61–1.24)
GFR calc Af Amer: 35 mL/min — ABNORMAL LOW (ref >60)
GFR calc Af Amer: 38 mL/min — ABNORMAL LOW (ref >60)
GFR calc non Af Amer: 30 mL/min — ABNORMAL LOW (ref >60)
GFR calc non Af Amer: 33 mL/min — ABNORMAL LOW (ref >60)
Glucose, Bld: 112 mg/dL — ABNORMAL HIGH (ref 70–99)
Glucose, Bld: 94 mg/dL (ref 70–99)
Potassium: 3.9 mmol/L (ref 3.5–5.1)
Potassium: 4.7 mmol/L (ref 3.5–5.1)
Sodium: 141 mmol/L (ref 135–145)
Sodium: 141 mmol/L (ref 135–145)
Total Bilirubin: 1.2 mg/dL (ref 0.3–1.2)
Total Bilirubin: 1 mg/dL (ref 0.3–1.2)
Total Protein: 6.2 g/dL — ABNORMAL LOW (ref 6.5–8.1)
Total Protein: 6.2 g/dL — ABNORMAL LOW (ref 6.5–8.1)

## 2020-05-10 LAB — CBC WITH DIFFERENTIAL/PLATELET
Abs Immature Granulocytes: 0.02 10*3/uL (ref 0.00–0.07)
Basophils Absolute: 0.1 10*3/uL (ref 0.0–0.1)
Basophils Relative: 2 %
Eosinophils Absolute: 0.9 10*3/uL — ABNORMAL HIGH (ref 0.0–0.5)
Eosinophils Relative: 15 %
HCT: 41.1 % (ref 39.0–52.0)
Hemoglobin: 13.4 g/dL (ref 13.0–17.0)
Immature Granulocytes: 0 %
Lymphocytes Relative: 33 %
Lymphs Abs: 1.8 10*3/uL (ref 0.7–4.0)
MCH: 32.8 pg (ref 26.0–34.0)
MCHC: 32.6 g/dL (ref 30.0–36.0)
MCV: 100.7 fL — ABNORMAL HIGH (ref 80.0–100.0)
Monocytes Absolute: 0.4 10*3/uL (ref 0.1–1.0)
Monocytes Relative: 7 %
Neutro Abs: 2.4 10*3/uL (ref 1.7–7.7)
Neutrophils Relative %: 43 %
Platelets: 90 10*3/uL — ABNORMAL LOW (ref 150–400)
RBC: 4.08 MIL/uL — ABNORMAL LOW (ref 4.22–5.81)
RDW: 14.5 % (ref 11.5–15.5)
WBC: 5.6 10*3/uL (ref 4.0–10.5)
nRBC: 0 % (ref 0.0–0.2)

## 2020-05-10 LAB — URINALYSIS, ROUTINE W REFLEX MICROSCOPIC
Bacteria, UA: NONE SEEN
Bilirubin Urine: NEGATIVE
Glucose, UA: NEGATIVE mg/dL
Ketones, ur: NEGATIVE mg/dL
Leukocytes,Ua: NEGATIVE
Nitrite: NEGATIVE
Protein, ur: 30 mg/dL — AB
Specific Gravity, Urine: 1.016 (ref 1.005–1.030)
pH: 6 (ref 5.0–8.0)

## 2020-05-10 LAB — I-STAT CHEM 8, ED
BUN: 39 mg/dL — ABNORMAL HIGH (ref 8–23)
Calcium, Ion: 1.1 mmol/L — ABNORMAL LOW (ref 1.15–1.40)
Chloride: 106 mmol/L (ref 98–111)
Creatinine, Ser: 1.8 mg/dL — ABNORMAL HIGH (ref 0.61–1.24)
Glucose, Bld: 99 mg/dL (ref 70–99)
HCT: 41 % (ref 39.0–52.0)
Hemoglobin: 13.9 g/dL (ref 13.0–17.0)
Potassium: 3.9 mmol/L (ref 3.5–5.1)
Sodium: 143 mmol/L (ref 135–145)
TCO2: 24 mmol/L (ref 22–32)

## 2020-05-10 LAB — CBC
HCT: 43 % (ref 39.0–52.0)
Hemoglobin: 14.2 g/dL (ref 13.0–17.0)
MCH: 32.7 pg (ref 26.0–34.0)
MCHC: 33 g/dL (ref 30.0–36.0)
MCV: 99.1 fL (ref 80.0–100.0)
Platelets: 95 10*3/uL — ABNORMAL LOW (ref 150–400)
RBC: 4.34 MIL/uL (ref 4.22–5.81)
RDW: 14.3 % (ref 11.5–15.5)
WBC: 5.7 10*3/uL (ref 4.0–10.5)
nRBC: 0 % (ref 0.0–0.2)

## 2020-05-10 LAB — CBG MONITORING, ED
Glucose-Capillary: 101 mg/dL — ABNORMAL HIGH (ref 70–99)
Glucose-Capillary: 90 mg/dL (ref 70–99)

## 2020-05-10 LAB — PROTIME-INR
INR: 1.1 (ref 0.8–1.2)
Prothrombin Time: 13.6 seconds (ref 11.4–15.2)

## 2020-05-10 LAB — MAGNESIUM: Magnesium: 2.2 mg/dL (ref 1.7–2.4)

## 2020-05-10 LAB — SARS CORONAVIRUS 2 BY RT PCR (HOSPITAL ORDER, PERFORMED IN ~~LOC~~ HOSPITAL LAB): SARS Coronavirus 2: NEGATIVE

## 2020-05-10 LAB — APTT: aPTT: 32 seconds (ref 24–36)

## 2020-05-10 LAB — ETHANOL: Alcohol, Ethyl (B): 53 mg/dL — ABNORMAL HIGH (ref <10)

## 2020-05-10 LAB — PHOSPHORUS: Phosphorus: 4.4 mg/dL (ref 2.5–4.6)

## 2020-05-10 MED ORDER — LORAZEPAM 2 MG/ML IJ SOLN: 1.0000 mg | INTRAMUSCULAR | Status: DC | PRN

## 2020-05-10 MED ORDER — GLUCOSAMINE-CHONDROITIN 250-200 MG PO CAPS: ORAL_CAPSULE | Freq: Every day | ORAL | Status: DC

## 2020-05-10 MED ORDER — STROKE: EARLY STAGES OF RECOVERY BOOK
Freq: Once | Status: AC
  Filled 2020-05-10: qty 1

## 2020-05-10 MED ORDER — EZETIMIBE 10 MG PO TABS
10.0000 mg | ORAL_TABLET | Freq: Every day | ORAL | Status: DC
  Administered 2020-05-10: 10 mg via ORAL
  Filled 2020-05-10: qty 1

## 2020-05-10 MED ORDER — VITAMIN B-12 1000 MCG PO TABS
1000.0000 ug | ORAL_TABLET | Freq: Every day | ORAL | Status: DC
  Administered 2020-05-11: 1000 ug via ORAL
  Filled 2020-05-10: qty 1

## 2020-05-10 MED ORDER — OMEGA-3-ACID ETHYL ESTERS 1 G PO CAPS: 1.0000 g | ORAL_CAPSULE | Freq: Every day | ORAL | Status: DC

## 2020-05-10 MED ORDER — FOLIC ACID 1 MG PO TABS
1.0000 mg | ORAL_TABLET | Freq: Every day | ORAL | Status: DC
  Administered 2020-05-10 – 2020-05-11 (×2): 1 mg via ORAL
  Filled 2020-05-10 (×2): qty 1

## 2020-05-10 MED ORDER — TERAZOSIN HCL 5 MG PO CAPS
10.0000 mg | ORAL_CAPSULE | Freq: Two times a day (BID) | ORAL | Status: DC
  Administered 2020-05-10 – 2020-05-11 (×2): 10 mg via ORAL
  Filled 2020-05-10 (×3): qty 2

## 2020-05-10 MED ORDER — PREGABALIN 25 MG PO CAPS
50.0000 mg | ORAL_CAPSULE | Freq: Two times a day (BID) | ORAL | Status: DC
  Administered 2020-05-10 – 2020-05-11 (×2): 50 mg via ORAL
  Filled 2020-05-10 (×2): qty 2

## 2020-05-10 MED ORDER — FAMOTIDINE 20 MG PO TABS
20.0000 mg | ORAL_TABLET | Freq: Every day | ORAL | Status: DC
  Administered 2020-05-10: 20 mg via ORAL
  Filled 2020-05-10: qty 1

## 2020-05-10 MED ORDER — THIAMINE HCL 100 MG PO TABS
100.0000 mg | ORAL_TABLET | Freq: Every day | ORAL | Status: DC
  Administered 2020-05-10 – 2020-05-11 (×2): 100 mg via ORAL
  Filled 2020-05-10 (×2): qty 1

## 2020-05-10 MED ORDER — THIAMINE HCL 100 MG/ML IJ SOLN: 100.0000 mg | Freq: Every day | INTRAMUSCULAR | Status: DC

## 2020-05-10 MED ORDER — SENNOSIDES-DOCUSATE SODIUM 8.6-50 MG PO TABS: 1.0000 | ORAL_TABLET | Freq: Every evening | ORAL | Status: DC | PRN

## 2020-05-10 MED ORDER — ADULT MULTIVITAMIN W/MINERALS CH: 1.0000 | ORAL_TABLET | Freq: Every day | ORAL | Status: DC

## 2020-05-10 MED ORDER — ASPIRIN EC 81 MG PO TBEC
81.0000 mg | DELAYED_RELEASE_TABLET | Freq: Every day | ORAL | Status: DC
  Administered 2020-05-10: 81 mg via ORAL
  Filled 2020-05-10: qty 1

## 2020-05-10 MED ORDER — OMEGA-3-ACID ETHYL ESTERS 1 G PO CAPS
1.0000 g | ORAL_CAPSULE | Freq: Every day | ORAL | Status: DC
  Administered 2020-05-11: 1 g via ORAL
  Filled 2020-05-10: qty 1

## 2020-05-10 MED ORDER — BIOTIN 5000 MCG PO TABS: 5000.0000 ug | ORAL_TABLET | Freq: Every day | ORAL | Status: DC

## 2020-05-10 MED ORDER — FLUTICASONE FUROATE-VILANTEROL 100-25 MCG/INH IN AEPB
1.0000 | INHALATION_SPRAY | Freq: Every day | RESPIRATORY_TRACT | Status: DC
  Filled 2020-05-10: qty 28

## 2020-05-10 MED ORDER — LORAZEPAM 1 MG PO TABS: 1.0000 mg | ORAL_TABLET | ORAL | Status: DC | PRN

## 2020-05-10 MED ORDER — SODIUM CHLORIDE 0.9 % IV SOLN: INTRAVENOUS | Status: DC

## 2020-05-10 MED ORDER — MAGNESIUM OXIDE 400 (241.3 MG) MG PO TABS
200.0000 mg | ORAL_TABLET | Freq: Every day | ORAL | Status: DC
  Administered 2020-05-11: 200 mg via ORAL
  Filled 2020-05-10: qty 1

## 2020-05-10 MED ORDER — TURMERIC 500 MG PO CAPS: 500.0000 mg | ORAL_CAPSULE | Freq: Every day | ORAL | Status: DC

## 2020-05-10 MED ORDER — ADULT MULTIVITAMIN W/MINERALS CH
1.0000 | ORAL_TABLET | Freq: Every day | ORAL | Status: DC
  Administered 2020-05-10 – 2020-05-11 (×2): 1 via ORAL
  Filled 2020-05-10 (×2): qty 1

## 2020-05-10 MED ORDER — HEPARIN (PORCINE) 25000 UT/250ML-% IV SOLN
1150.0000 [IU]/h | INTRAVENOUS | Status: DC
  Administered 2020-05-10: 1300 [IU]/h via INTRAVENOUS
  Filled 2020-05-10: qty 250

## 2020-05-10 MED ORDER — MAGNESIUM 250 MG PO TABS: 250.0000 mg | ORAL_TABLET | Freq: Every day | ORAL | Status: DC

## 2020-05-10 MED ORDER — SODIUM CHLORIDE 0.9% FLUSH
3.0000 mL | Freq: Once | INTRAVENOUS | Status: AC
  Administered 2020-05-10: 3 mL via INTRAVENOUS

## 2020-05-10 MED ORDER — ACETAMINOPHEN 500 MG PO TABS: 500.0000 mg | ORAL_TABLET | Freq: Four times a day (QID) | ORAL | Status: DC | PRN

## 2020-05-10 MED ORDER — ALLOPURINOL 100 MG PO TABS
100.0000 mg | ORAL_TABLET | Freq: Every day | ORAL | Status: DC
  Administered 2020-05-11: 100 mg via ORAL
  Filled 2020-05-10: qty 1

## 2020-05-10 MED ORDER — IOHEXOL 350 MG/ML SOLN
75.0000 mL | Freq: Once | INTRAVENOUS | Status: AC | PRN
  Administered 2020-05-10: 75 mL via INTRAVENOUS

## 2020-05-10 MED ORDER — ATORVASTATIN CALCIUM 40 MG PO TABS
40.0000 mg | ORAL_TABLET | Freq: Every day | ORAL | Status: DC
  Administered 2020-05-10: 40 mg via ORAL
  Filled 2020-05-10: qty 1

## 2020-05-10 MED ORDER — LEVOTHYROXINE SODIUM 88 MCG PO TABS
88.0000 ug | ORAL_TABLET | Freq: Every day | ORAL | Status: DC
  Administered 2020-05-11: 88 ug via ORAL
  Filled 2020-05-10: qty 1

## 2020-05-10 NOTE — Progress Notes (Signed)
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 Ordering physician aware that cardiology order required from cardiologist. Once order is in, [employer] rep can be paged. RN will also be needed to monitor pt vitals during scan.

## 2020-05-10 NOTE — ED Provider Notes (Signed)
[facility] DEPARTMENT Provider Note   CSN: [id number] Arrival date & time: 05/10/20  1621  An emergency department physician performed an initial assessment on this suspected stroke patient at 35.  History Chief Complaint  Patient presents with  . Neurologic Problem    code stroke    Derek [name] is a 85 y.o. male.  Hx of stroke on blood thinners, right sided facial droop, aphasia about 1 hour ago.   The history is provided by the patient.  Neurologic Problem This is a new problem. The current episode started 1 to 2 hours ago. The problem occurs constantly. The problem has not changed since onset.Pertinent negatives include no chest pain, no abdominal pain, no headaches and no shortness of breath. Associated symptoms comments: right sided facial droop, expressive aphasia . Nothing aggravates the symptoms. Nothing relieves the symptoms. He has tried nothing for the symptoms.       Past Medical History:  Diagnosis Date  . AAA (abdominal aortic aneurysm) ([place])   . Arthritis   . Chronic kidney disease   . COPD (chronic obstructive pulmonary disease) ([place])   . Coronary artery disease   . Headache   . Hypertension   . Hypothyroidism   . Myocardial infarction ([place])   . Presence of permanent cardiac pacemaker   . Shortness of breath    on exertion;  has productive cough at times  . Sleep apnea    states mild . no cpap  . Stroke [facility])     Patient Active Problem List   Diagnosis Date Noted  . Upper airway cough syndrome 08/01/2018  . TIA (transient ischemic attack) 10/03/2017  . Lacunar stroke, acute ([place])   . Carotid stenosis 09/12/2017  . Vertebral basilar insufficiency 09/12/2017  . Acute ischemic stroke ([place])   . Persistent atrial fibrillation ([place])   . AV block   . Hyperlipidemia   . Essential hypertension   . Hx of CABG   . 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 Chronic respiratory failure with hypoxia  ([place]) 02/17/2017  . Morbid obesity ([place]) 04/04/2016  . Leg swelling 04/04/2016  . COPD GOLD II  04/02/2016  . AAA (abdominal aortic aneurysm) without rupture ([place]) 10/01/2014  . Coronary artery disease involving coronary bypass graft of native heart without angina pectoris 07/24/2014  . Dyspnea 07/24/2014  . Faintness 07/24/2014  . COLD (chronic obstructive lung disease) ([place]) 07/24/2014  . Bradycardia 07/24/2014  . Vertigo 07/24/2014  . Aorto-iliac disease ([place]) 09/28/2013  . Abdominal aneurysm without mention of rupture 09/08/2012    Past Surgical History:  Procedure Laterality Date  . APPENDECTOMY  1963  . CATARACT EXTRACTION     w/ lid rise  . CORONARY ARTERY BYPASS GRAFT  2007  . EYE SURGERY Right March 2016   Cataract  . EYE SURGERY Bilateral May 2016   Eyebrow Lift  . EYE SURGERY Left June 2016   Cataract  . IR ANGIO INTRA EXTRACRAN SEL COM CAROTID INNOMINATE BILAT MOD SED  09/12/2017  . IR ANGIO VERTEBRAL SEL SUBCLAVIAN INNOMINATE UNI R MOD SED  09/12/2017  . IR RADIOLOGIST EVAL & MGMT  12/07/2017  . KNEE ARTHROSCOPY  02/26/2012   Procedure: ARTHROSCOPY KNEE;  Surgeon: Sydnee Cabal, MD;  Location: [facility];  Service: Orthopedics;  Laterality: Left;  medial menisectomy and chondraplasty  . PACEMAKER IMPLANT N/A 10/04/2017   Procedure: PACEMAKER IMPLANT;  Surgeon: Deboraha Sprang, MD;  Location: [facility];  Service: Cardiovascular;  Laterality: N/A;       Family History  Problem Relation Age of Onset  . Diabetes Mother   . Heart disease Mother        Before age 58  . Diabetes Sister   . Hypertension Sister   . Diabetes Brother   . Hypertension Brother   . Hyperlipidemia Brother   . Hypertension Son   . Heart attack Daughter     Social History   Tobacco Use  . Smoking status: Former Smoker    Packs/day: 0.75    Years: 51.00    Pack years: 38.25    Types: Cigarettes    Quit date: 02/23/2000    Years since quitting: 20.2  .  Smokeless tobacco: Never Used  Substance Use Topics  . Alcohol use: Yes    Alcohol/week: 0.0 - 1.0 standard drinks    Comment: Moderate to little  . Drug use: No    Home Medications Prior to Admission medications   Medication Sig Start Date End Date Taking? Authorizing Provider  acetaminophen (TYLENOL) 500 MG tablet Take 500 mg by mouth every 6 (six) hours as needed for moderate pain or headache.    [provider]  allopurinol (ZYLOPRIM) 100 MG tablet Take 1 tablet by mouth 2 (two) times a day. 03/23/19   [provider]  aspirin EC 81 MG tablet Take 81 mg by mouth daily after supper.    [provider]  atorvastatin (LIPITOR) 40 MG tablet Take 40 mg by mouth daily. 05/22/18   [provider]  Biotin 5000 MCG TABS Take 5,000 mcg by mouth daily.    [provider]  budesonide-formoterol (SYMBICORT) 80-4.5 MCG/ACT inhaler Take 2 puffs first thing in am and then another 2 puffs about 12 hours later. 11/21/19   Tanda Rockers, MD  ELIQUIS 2.5 MG TABS tablet TAKE 1 TABLET TWICE A DAY 04/03/19   Belva Crome, MD  ezetimibe (ZETIA) 10 MG tablet Take 10 mg by mouth daily after supper.     [provider]  famotidine (PEPCID) 20 MG tablet One at bedtime 08/01/18   Tanda Rockers, MD  fluticasone-salmeterol (ADVAIR HFA) [phone] MCG/ACT inhaler Inhale 2 puffs into the lungs 2 (two) times daily. 12/04/19   Tanda Rockers, MD  furosemide (LASIX) 20 MG tablet TAKE 1 TABLET DAILY AS NEEDED FOR SWELLING 01/15/20   Tanda Rockers, MD  GLUCOSAMINE-CHONDROITIN PO Take 1 tablet by mouth daily.     [provider]  levothyroxine (SYNTHROID, LEVOTHROID) 88 MCG tablet Take 88 mcg by mouth daily before breakfast.    [provider]  Magnesium 250 MG TABS Take 250 mg by mouth daily.    [provider]  Multiple Vitamin (MULTIVITAMIN WITH MINERALS) TABS tablet Take 1 tablet by mouth daily.    [provider]  Omega-3 Fatty Acids  (FISH OIL) 1000 MG CAPS Take 1 capsule by mouth daily.    [provider]  OXYGEN Inhale 2 L into the lungs as needed (for  shortness of breath with exertion only).     [provider]  pregabalin (LYRICA) 50 MG capsule Take 50 mg by mouth 2 (two) times daily.    [provider]  terazosin (HYTRIN) 5 MG capsule Take 10 mg by mouth 2 (two) times daily. 08/30/17   [provider]  Turmeric 500 MG CAPS Take 500 mg by mouth daily.    [provider]  vitamin B-12 (CYANOCOBALAMIN) 1000 MCG tablet Take 1,000 mcg by mouth daily.    [provider]    Allergies    Other, Sulfa antibiotics, Latex, and Tape  Review of Systems   Review of Systems  Constitutional: Negative for chills and fever.  HENT: Negative for ear pain and sore throat.   Eyes: Negative for pain and visual disturbance.  Respiratory: Negative for cough and shortness of breath.   Cardiovascular: Negative for chest pain and palpitations.  Gastrointestinal: Negative for abdominal pain and vomiting.  Genitourinary: Negative for dysuria and hematuria.  Musculoskeletal: Negative for arthralgias and back pain.  Skin: Negative for color change and rash.  Neurological: Positive for facial asymmetry. Negative for tremors, seizures, syncope, speech difficulty, weakness, numbness and headaches.  All other systems reviewed and are negative.   Physical Exam Updated Vital Signs  ED Triage Vitals [05/10/20 1600]  Enc Vitals Group     BP      Pulse      Resp      Temp      Temp src      SpO2      Weight 208 lb 15.9 oz (94.8 kg)     Height      Head Circumference      Peak Flow      Pain Score      Pain Loc      Pain Edu?      Excl. in [place]?     Physical Exam Vitals and nursing note reviewed.  Constitutional:      General: He is not in acute distress.    Appearance: He is well-developed. He is not ill-appearing.  HENT:     Head: Normocephalic and atraumatic.     Nose: Nose  normal.     Mouth/Throat:     Mouth: Mucous membranes are moist.  Eyes:     Extraocular Movements: Extraocular movements intact.     Conjunctiva/sclera: Conjunctivae normal.     Pupils: Pupils are equal, round, and reactive to light.  Cardiovascular:     Rate and Rhythm: Normal rate and regular rhythm.     Pulses: Normal pulses.     Heart sounds: Normal heart sounds. No murmur.  Pulmonary:     Effort: Pulmonary effort is normal. No respiratory distress.     Breath sounds: Normal breath sounds.  Abdominal:     Palpations: Abdomen is soft.     Tenderness: There is no abdominal tenderness.  Musculoskeletal:     Cervical back: Neck supple.  Skin:    General: Skin is warm and dry.     Capillary Refill: Capillary refill takes less than 2 seconds.  Neurological:     Mental Status: He is alert.     ED Results / Procedures / Treatments   Labs (all labs ordered are listed, but only abnormal results are displayed) Labs Reviewed  ETHANOL - Abnormal; Notable for the following components:      Result Value   Alcohol, Ethyl (B) 53 (*)    All other components within normal limits  CBC WITH DIFFERENTIAL/PLATELET - Abnormal; Notable for the following components:   RBC 4.08 (*)    MCV 100.7 (*)    Platelets 90 (*)    Eosinophils Absolute 0.9 (*)    All other components within normal limits  COMPREHENSIVE METABOLIC PANEL - Abnormal; Notable for the following components:   Glucose, Bld 112 (*)    BUN 39 (*)    Creatinine,  Ser 1.98 (*)    Total Protein 6.2 (*)    GFR calc non Af Amer 30 (*)    GFR calc Af Amer 35 (*)    All other components within normal limits  I-STAT CHEM 8, ED - Abnormal; Notable for the following components:   BUN 39 (*)    Creatinine, Ser 1.80 (*)    Calcium, Ion 1.10 (*)    All other components within normal limits  CBG MONITORING, ED - Abnormal; Notable for the following components:   Glucose-Capillary 101 (*)    All other components within normal limits   SARS CORONAVIRUS 2 BY RT PCR (HOSPITAL ORDER, [facility])  PROTIME-INR  APTT  CBC  DIFFERENTIAL  URINALYSIS, ROUTINE W REFLEX MICROSCOPIC  CBG MONITORING, ED    EKG EKG Interpretation  Date/Time:  Friday May 10 2020 16:47:35 EDT Ventricular Rate:  84 PR Interval:    QRS Duration: 200 QT Interval:  452 QTC Calculation: 535 R Axis:   -97 Text Interpretation: Accelerated junctional rhythm Right bundle branch block Confirmed by Lennice Sites [phone]) on 05/10/2020 4:50:03 PM   Radiology CT Code Stroke CTA Head W/WO contrast  Addendum Date: 05/10/2020   ADDENDUM REPORT: 05/10/2020 17:40 ADDENDUM: These results were called by telephone at the time of interpretation on 05/10/2020 at 5:40 pm to provider Dr. Ronnald Nian, who verbally acknowledged these results. Electronically Signed   By: Kellie Simmering DO   On: 05/10/2020 17:40   Result Date: 05/10/2020 CLINICAL DATA:  Neuro deficit, acute, stroke suspected. Additional provided: Left-sided facial droop, last known well 15:45 EXAM: CT ANGIOGRAPHY HEAD AND NECK TECHNIQUE: Multidetector CT imaging of the head and neck was performed using the standard protocol during bolus administration of intravenous contrast. Multiplanar CT image reconstructions and MIPs were obtained to evaluate the vascular anatomy. Carotid stenosis measurements (when applicable) are obtained utilizing NASCET criteria, using the distal internal carotid diameter as the denominator. CONTRAST:  61mL OMNIPAQUE IOHEXOL 350 MG/ML SOLN COMPARISON:  Noncontrast head CT performed earlier the same day 05/10/2020, brain MRI 10/03/2017, CT angiogram head/neck 09/11/2017 FINDINGS: CTA NECK FINDINGS Aortic arch: Standard aortic branching. Atherosclerotic plaque within the visualized aortic arch and proximal major branch vessels of the neck. No hemodynamically significant innominate or proximal subclavian artery stenosis. Right carotid system: The CCA is patent to the  bifurcation without significant stenosis. Scattered calcified plaque within this vessel. There is prominent calcified plaque within the carotid bifurcation and proximal ICA. There is high-grade stenosis of the proximal ICA, exceeding 70%, which appears progressed as compared to prior CTA 09/11/2017. Distal to this, the ICA is patent within the neck without significant stenosis. Left carotid system: The CCA is patent to the bifurcation without significant stenosis. Scattered calcified plaque within this vessel. Moderate calcified plaque within the carotid bifurcation and proximal ICA. Unchanged 50% stenosis within the left ICA bulb. Vertebral arteries: The right vertebral artery is dominant. Redemonstrated prominent calcified plaque at the origin of the right vertebral artery. As before, the V1 and proximal V2 right vertebral artery are occluded. There is reconstitution of flow more distally within the right vertebral artery. Redemonstrated moderate stenosis at the origin of the non dominant left vertebral artery. More distally, the left vertebral artery is patent within the neck without significant stenosis. Skeleton: No acute bony abnormality or aggressive osseous lesion. Cervical spondylosis without high-grade bony spinal canal narrowing. Other neck: No neck mass or cervical lymphadenopathy. Upper chest: Emphysema within the imaged lung apices. Prior  median sternotomy. Review of the MIP images confirms the above findings CTA HEAD FINDINGS Anterior circulation: The intracranial internal carotid arteries are patent. Calcified plaque within both vessels. Unchanged sites of moderate atherosclerotic narrowing within the cavernous right ICA. No more than mild stenosis of the intracranial left ICA. The M1 middle cerebral arteries are patent. Mild atherosclerotic narrowing within the mid M1 left MCA (series 10, image 20). No M2 proximal branch occlusion or high-grade proximal stenosis is identified. Hypoplastic A1 right  ACA. The anterior cerebral arteries are patent without significant proximal stenosis. 1-2 mm inferiorly projecting vascular protrusion arising from the supraclinoid left ICA which may reflect an infundibulum or tiny aneurysm (series 9, image 120). Posterior circulation: The dominant intracranial right vertebral arteries patent with scattered calcified plaque and sites of mild stenosis. The non dominant intracranial left vertebral artery is developmentally diminutive, but patent and terminates as the left PICA. The basilar artery is patent with multifocal calcified plaque and sites of mild stenosis. The posterior cerebral arteries are patent bilaterally. There is diffuse atherosclerotic irregularity of both vessels. Severe segmental stenosis within the distal P2 right PCA. Multifocal moderate stenoses within the proximal and distal left PCA. Posterior cerebral arteries are hypoplastic or absent bilaterally. Venous sinuses: Within limitations of contrast timing, no convincing thrombus. Anatomic variants: As described Review of the MIP images confirms the above findings IMPRESSION: CTA neck: 1. The bilateral common and internal carotid arteries are patent within the neck. There is high-grade (greater than 70%) stenosis of the proximal right ICA which appears progressed as compared to prior CTA 09/11/2017. Unchanged 50% stenosis within the left ICA bulb. 2. Redemonstrated prominent calcified plaque at the origin of the dominant right vertebral artery. As before, the V1 and proximal V2 right vertebral artery are occluded with reconstitution more distally. Unchanged 50% stenosis at the origin of the non dominant left vertebral artery. CTA head: 1. No intracranial large vessel occlusion. 2. Intracranial atherosclerotic disease with multifocal stenoses, most notably as follows. 3. Moderate atherosclerotic narrowing of the cavernous right ICA. 4. Diffuse atherosclerotic irregularity of both posterior cerebral arteries.  Severe segmental stenosis within the distal P2 right PCA. Multifocal moderate stenoses within the proximal and distal left PCA. 5. 1-2 mm infundibulum versus tiny aneurysm arising from the supraclinoid left ICA. Electronically Signed: By: Kellie Simmering DO On: 05/10/2020 17:26   CT Code Stroke CTA Neck W/WO contrast  Addendum Date: 05/10/2020   ADDENDUM REPORT: 05/10/2020 17:40 ADDENDUM: These results were called by telephone at the time of interpretation on 05/10/2020 at 5:40 pm to provider Dr. Ronnald Nian, who verbally acknowledged these results. Electronically Signed   By: Kellie Simmering DO   On: 05/10/2020 17:40   Result Date: 05/10/2020 CLINICAL DATA:  Neuro deficit, acute, stroke suspected. Additional provided: Left-sided facial droop, last known well 15:45 EXAM: CT ANGIOGRAPHY HEAD AND NECK TECHNIQUE: Multidetector CT imaging of the head and neck was performed using the standard protocol during bolus administration of intravenous contrast. Multiplanar CT image reconstructions and MIPs were obtained to evaluate the vascular anatomy. Carotid stenosis measurements (when applicable) are obtained utilizing NASCET criteria, using the distal internal carotid diameter as the denominator. CONTRAST:  62mL OMNIPAQUE IOHEXOL 350 MG/ML SOLN COMPARISON:  Noncontrast head CT performed earlier the same day 05/10/2020, brain MRI 10/03/2017, CT angiogram head/neck 09/11/2017 FINDINGS: CTA NECK FINDINGS Aortic arch: Standard aortic branching. Atherosclerotic plaque within the visualized aortic arch and proximal major branch vessels of the neck. No hemodynamically significant innominate or proximal subclavian artery  stenosis. Right carotid system: The CCA is patent to the bifurcation without significant stenosis. Scattered calcified plaque within this vessel. There is prominent calcified plaque within the carotid bifurcation and proximal ICA. There is high-grade stenosis of the proximal ICA, exceeding 70%, which appears progressed  as compared to prior CTA 09/11/2017. Distal to this, the ICA is patent within the neck without significant stenosis. Left carotid system: The CCA is patent to the bifurcation without significant stenosis. Scattered calcified plaque within this vessel. Moderate calcified plaque within the carotid bifurcation and proximal ICA. Unchanged 50% stenosis within the left ICA bulb. Vertebral arteries: The right vertebral artery is dominant. Redemonstrated prominent calcified plaque at the origin of the right vertebral artery. As before, the V1 and proximal V2 right vertebral artery are occluded. There is reconstitution of flow more distally within the right vertebral artery. Redemonstrated moderate stenosis at the origin of the non dominant left vertebral artery. More distally, the left vertebral artery is patent within the neck without significant stenosis. Skeleton: No acute bony abnormality or aggressive osseous lesion. Cervical spondylosis without high-grade bony spinal canal narrowing. Other neck: No neck mass or cervical lymphadenopathy. Upper chest: Emphysema within the imaged lung apices. Prior median sternotomy. Review of the MIP images confirms the above findings CTA HEAD FINDINGS Anterior circulation: The intracranial internal carotid arteries are patent. Calcified plaque within both vessels. Unchanged sites of moderate atherosclerotic narrowing within the cavernous right ICA. No more than mild stenosis of the intracranial left ICA. The M1 middle cerebral arteries are patent. Mild atherosclerotic narrowing within the mid M1 left MCA (series 10, image 20). No M2 proximal branch occlusion or high-grade proximal stenosis is identified. Hypoplastic A1 right ACA. The anterior cerebral arteries are patent without significant proximal stenosis. 1-2 mm inferiorly projecting vascular protrusion arising from the supraclinoid left ICA which may reflect an infundibulum or tiny aneurysm (series 9, image 120). Posterior  circulation: The dominant intracranial right vertebral arteries patent with scattered calcified plaque and sites of mild stenosis. The non dominant intracranial left vertebral artery is developmentally diminutive, but patent and terminates as the left PICA. The basilar artery is patent with multifocal calcified plaque and sites of mild stenosis. The posterior cerebral arteries are patent bilaterally. There is diffuse atherosclerotic irregularity of both vessels. Severe segmental stenosis within the distal P2 right PCA. Multifocal moderate stenoses within the proximal and distal left PCA. Posterior cerebral arteries are hypoplastic or absent bilaterally. Venous sinuses: Within limitations of contrast timing, no convincing thrombus. Anatomic variants: As described Review of the MIP images confirms the above findings IMPRESSION: CTA neck: 1. The bilateral common and internal carotid arteries are patent within the neck. There is high-grade (greater than 70%) stenosis of the proximal right ICA which appears progressed as compared to prior CTA 09/11/2017. Unchanged 50% stenosis within the left ICA bulb. 2. Redemonstrated prominent calcified plaque at the origin of the dominant right vertebral artery. As before, the V1 and proximal V2 right vertebral artery are occluded with reconstitution more distally. Unchanged 50% stenosis at the origin of the non dominant left vertebral artery. CTA head: 1. No intracranial large vessel occlusion. 2. Intracranial atherosclerotic disease with multifocal stenoses, most notably as follows. 3. Moderate atherosclerotic narrowing of the cavernous right ICA. 4. Diffuse atherosclerotic irregularity of both posterior cerebral arteries. Severe segmental stenosis within the distal P2 right PCA. Multifocal moderate stenoses within the proximal and distal left PCA. 5. 1-2 mm infundibulum versus tiny aneurysm arising from the supraclinoid left ICA. Electronically Signed:  By: Kellie Simmering DO On:  05/10/2020 17:26   DG Chest Portable 1 View  Result Date: 05/10/2020 CLINICAL DATA:  Altered mental status EXAM: PORTABLE CHEST 1 VIEW COMPARISON:  November 28, 2019 FINDINGS: There is cardiomegaly with pulmonary vascularity normal. There is mild interstitial thickening about edema or consolidation. There are scattered calcified granulomas. Pacemaker leads are attached to the right atrium and right ventricle. Patient is status post coronary artery bypass grafting. There is aortic atherosclerosis. No adenopathy. No bone lesions. IMPRESSION: Stable cardiomegaly. Scattered calcified granulomas. Mild interstitial thickening may represent a degree of underlying fibrotic type change. No edema or consolidation evident. Postoperative changes noted. Aortic Atherosclerosis (ICD10-I70.0). Electronically Signed   By: Lowella Grip III M.D.   On: 05/10/2020 16:56   CT HEAD CODE STROKE WO CONTRAST  Result Date: 05/10/2020 CLINICAL DATA:  Code stroke.  Left facial droop. EXAM: CT HEAD WITHOUT CONTRAST TECHNIQUE: Contiguous axial images were obtained from the base of the skull through the vertex without intravenous contrast. COMPARISON:  CT head 05/09/2018 FINDINGS: Brain: Generalized atrophy. Negative for acute infarct, hemorrhage, mass. No midline shift. Vascular: Negative for hyperdense vessel. Atherosclerotic calcification in the right vertebral artery and basilar and in both carotid arteries. Skull: Negative Sinuses/Orbits: Mild mucosal edema right maxillary sinus. Bilateral cataract extraction. No orbital mass lesion. Other: None ASPECTS ([place] Stroke Program Early CT Score) - Ganglionic level infarction (caudate, lentiform nuclei, internal capsule, insula, M1-M3 cortex): 7 - Supraganglionic infarction (M4-M6 cortex): 3 Total score (0-10 with 10 being normal): 10 IMPRESSION: 1. No acute abnormality 2. ASPECTS is 10 3. Moderate atrophy. 4. Preliminary report texted to Dr. Rory Percy via Shea Evans Electronically Signed    By: Franchot Gallo M.D.   On: 05/10/2020 16:41    Procedures .Critical Care Performed by: Lennice Sites, DO Authorized by: Lennice Sites, DO   Critical care provider statement:    Critical care time (minutes):  35   Critical care was necessary to treat or prevent imminent or life-threatening deterioration of the following conditions:  CNS failure or compromise   Critical care was time spent personally by me on the following activities:  Blood draw for specimens, development of treatment plan with patient or surrogate, discussions with consultants, evaluation of patient's response to treatment, examination of patient, obtaining history from patient or surrogate, ordering and performing treatments and interventions, ordering and review of laboratory studies, pulse oximetry, re-evaluation of patient's condition, review of old charts and ordering and review of radiographic studies   I assumed direction of critical care for this patient from another provider in my specialty: no     (including critical care time)  Medications Ordered in ED Medications  sodium chloride flush (NS) 0.9 % injection 3 mL (has no administration in time range)  iohexol (OMNIPAQUE) 350 MG/ML injection 75 mL (75 mLs Intravenous Contrast Given 05/10/20 1639)    ED Course  I have reviewed the triage vital signs and the nursing notes.  Pertinent labs & imaging results that were available during my care of the patient were reviewed by me and considered in my medical decision making (see chart for details).    MDM Rules/Calculators/A&P                      AMEET SANDY is an 85 year old male with history of CAD, COPD, stroke on Eliquis and aspirin, CKD who presents to the ED as a code stroke.  Patient with unremarkable vitals.  No fever.  About  an hour prior to arrival had possibly right-sided facial droop, expressive aphasia.  Has had some wine today.  Upon my evaluation with neurology at the bedside there does not  appear to be any focal neurological findings.  He is a little bit slow to answer questions but he is able to.  CT imaging was unremarkable.  No head bleed.  Given that patient is on Eliquis and aspirin and unclear if this is truly a stroke and TPA will not be given.  No concern for large vessel occlusion.  CTA was unremarkable.  We will try to obtain MRI if able as patient has pacemaker.  Will check lab work including ethanol level.  Disposition is pending at this time.  Covid test negative.  Alcohol level overall fairly unremarkable at 53.  Chronic renal disease.  Otherwise no significant anemia, electrolyte abnormality.  Patient does have some diffuse atherosclerotic disease in his PCA.  Also has narrowing of his right ICA.  Dr. Rory Percy with neurology has talked with interventional and are recommending starting patient on a heparin infusion and holding Eliquis as he may need stent placed on Monday.  Patient otherwise hemodynamically stable throughout my care.  To be admitted to medicine for further care.  Patient started on heparin.  We will try to obtain an MRI if able to clear his pacemaker.  This chart was dictated using voice recognition software.  Despite best efforts to proofread,  errors can occur which can change the documentation meaning.     Final Clinical Impression(s) / ED Diagnoses Final diagnoses:  Stroke-like symptoms    Rx / DC Orders ED Discharge Orders    None       Lennice Sites, DO 05/10/20 1846

## 2020-05-10 NOTE — Plan of Care (Signed)

## 2020-05-10 NOTE — ED Triage Notes (Signed)
Pt bib ems from home when wife noticed pt with R sided facial droop and slurred speech. Wife states pt was drinking wine when she noticed the deficits. [place] Pt VSS with EMS.

## 2020-05-10 NOTE — Progress Notes (Addendum)
Derek [name] for Heparin Indication: atrial fibrillation  Allergies  Allergen Reactions  . Other Hives, Dermatitis and Other (See Comments)    -mycins  . Sulfa Antibiotics Rash  . Latex Rash  . Tape Rash and Other (See Comments)    Reaction to adhesive tape - pls use paper tape    Patient Measurements: Height: 5\' 7"  (170.2 cm) Weight: 94.8 kg (208 lb 15.9 oz) IBW/kg (Calculated) : 66.1 Heparin Dosing Weight: 86.3 kg  Vital Signs: Temp: 98 F (36.7 C) (05/21 1749) Temp Source: Oral (05/21 1749) BP: 124/88 (05/21 1749) Pulse Rate: 59 (05/21 1745)  Labs: Recent Labs    05/10/20 1627  HGB 13.4  13.9  HCT 41.1  41.0  PLT 90*  APTT 32  LABPROT 13.6  INR 1.1  CREATININE 1.98*  1.80*    Estimated Creatinine Clearance: 34.1 mL/min (A) (by C-G formula based on SCr of 1.8 mg/dL (H)).   Medical History: Past Medical History:  Diagnosis Date  . AAA (abdominal aortic aneurysm) ([place])   . Arthritis   . Chronic kidney disease   . COPD (chronic obstructive pulmonary disease) ([place])   . Coronary artery disease   . Headache   . Hypertension   . Hypothyroidism   . Myocardial infarction ([place])   . Presence of permanent cardiac pacemaker   . Shortness of breath    on exertion;  has productive cough at times  . Sleep apnea    states mild . no cpap  . Stroke [facility])     Medications:  Scheduled:  . sodium chloride flush  3 mL Intravenous Once    Assessment: Patient is a 89 yom that presented to the ED with stroke like symptoms. The patient is on Apixaban at home for Afib. Pharmacy has been asked to transition to Heparin at this time as may have IR procedure on Monday.   Last dose of Apixaban was at 1000 on 5/21 Goal of Therapy:  aPTT 66-102 seconds Monitor platelets by anticoagulation protocol: Yes   Plan:  - No Heparin bolus as on Apixaban PTA last dose was at 1000 on 5/21 - Start heparin drip @ 1300 units/hr on 5/21 at 2200  -  Will monitor heparin using aPTT until aPTT and Heparin levels correlate - aPTT level in ~ 6 hours  - Monitor patient for s/s of bleeding and CBC while on heparin   Duanne Limerick PharmD. BCPS  05/10/2020,7:05 PM

## 2020-05-10 NOTE — Consult Note (Addendum)
NEURO HOSPITALIST  CONSULT   Requesting Physician: Dr. Ronnald Nian    Chief Complaint: left side facial droop  History obtained from:  EMS/ chart review HPI:                                                                                                                                         Derek [name] is an 85 y.o. male  With PMH CVA (2018), HTN, MI, CAD, COPD, AAA,a. Fib (on eliquis)  who presented to [facility] ED as a code stroke for c/o left side facial droop.  Patient was on the porch drinking wine (3oz) with his friend Stanton Kidney when she noticed that he had been acting confused.  He has been confused and having memory deficits now over the past few months. EMS was called.  Noted patient to have left-sided droop and some word finding difficulty. Patient is on eliquis. He has been having short term memory deficits for the past few months. Needing to write things down, not remembering the day of the week etc.  No recent chest pains, shortness of breath. He was evaluated by Dr. Estanislado Pandy for potential right carotid revascularization but deferred due to his poor renal function.   ED course:  CTH: no hemorrhage BP: 135/83 BG; 101  MRI 09/2017:Small acute lacunar infarct in the lateral right thalamus  05/10/20 1545 tPA Given:no; on eliquis Modified Rankin: Rankin Score=1 NIHSS:1     Past Medical History:  Diagnosis Date  . AAA (abdominal aortic aneurysm) ([place])   . Arthritis   . Chronic kidney disease   . COPD (chronic obstructive pulmonary disease) ([place])   . Coronary artery disease   . Headache   . Hypertension   . Hypothyroidism   . Myocardial infarction ([place])   . Presence of permanent cardiac pacemaker   . Shortness of breath    on exertion;  has productive cough at times  . Sleep apnea    states mild . no cpap  . Stroke [facility])     Past Surgical History:  Procedure Laterality Date  . APPENDECTOMY  1963  . CATARACT EXTRACTION      w/ lid rise  . CORONARY ARTERY BYPASS GRAFT  2007  . EYE SURGERY Right March 2016   Cataract  . EYE SURGERY Bilateral May 2016   Eyebrow Lift  . EYE SURGERY Left June 2016   Cataract  . IR ANGIO INTRA EXTRACRAN SEL COM CAROTID INNOMINATE BILAT MOD SED  09/12/2017  . IR ANGIO VERTEBRAL SEL SUBCLAVIAN INNOMINATE UNI R MOD SED  09/12/2017  . IR RADIOLOGIST EVAL & MGMT  12/07/2017  . KNEE  ARTHROSCOPY  02/26/2012   Procedure: ARTHROSCOPY KNEE;  Surgeon: Sydnee Cabal, MD;  Location: [facility];  Service: Orthopedics;  Laterality: Left;  medial menisectomy and chondraplasty  . PACEMAKER IMPLANT N/A 10/04/2017   Procedure: PACEMAKER IMPLANT;  Surgeon: Deboraha Sprang, MD;  Location: [facility];  Service: Cardiovascular;  Laterality: N/A;    Family History  Problem Relation Age of Onset  . Diabetes Mother   . Heart disease Mother        Before age 46  . Diabetes Sister   . Hypertension Sister   . Diabetes Brother   . Hypertension Brother   . Hyperlipidemia Brother   . Hypertension Son   . Heart attack Daughter          Social History:  reports that he quit smoking about 20 years ago. His smoking use included cigarettes. He has a 38.25 pack-year smoking history. He has never used smokeless tobacco. He reports current alcohol use. He reports that he does not use drugs.  Allergies:  Allergies  Allergen Reactions  . Other Hives, Dermatitis and Other (See Comments)    -mycins  . Sulfa Antibiotics Rash  . Latex Rash  . Tape Rash and Other (See Comments)    Reaction to adhesive tape - pls use paper tape    Medications:                                                                                                                          Current Facility-Administered Medications  Medication Dose Route Frequency Provider Last Rate Last Admin  . sodium chloride flush (NS) 0.9 % injection 3 mL  3 mL Intravenous Once Lennice Sites, DO       Current Outpatient  Medications  Medication Sig Dispense Refill  . acetaminophen (TYLENOL) 500 MG tablet Take 500 mg by mouth every 6 (six) hours as needed for moderate pain or headache.    . allopurinol (ZYLOPRIM) 100 MG tablet Take 1 tablet by mouth 2 (two) times a day.    Marland Kitchen aspirin EC 81 MG tablet Take 81 mg by mouth daily after supper.    Marland Kitchen atorvastatin (LIPITOR) 40 MG tablet Take 40 mg by mouth daily.    . Biotin 5000 MCG TABS Take 5,000 mcg by mouth daily.    . budesonide-formoterol (SYMBICORT) 80-4.5 MCG/ACT inhaler Take 2 puffs first thing in am and then another 2 puffs about 12 hours later. 3 Inhaler 0  . ELIQUIS 2.5 MG TABS tablet TAKE 1 TABLET TWICE A DAY 180 tablet 3  . ezetimibe (ZETIA) 10 MG tablet Take 10 mg by mouth daily after supper.     . famotidine (PEPCID) 20 MG tablet One at bedtime 30 tablet 11  . fluticasone-salmeterol (ADVAIR HFA) 115-21 MCG/ACT inhaler Inhale 2 puffs into the lungs 2 (two) times daily. 1 Inhaler 12  . furosemide (LASIX) 20 MG tablet TAKE 1 TABLET DAILY AS NEEDED FOR SWELLING 90 tablet 3  .  GLUCOSAMINE-CHONDROITIN PO Take 1 tablet by mouth daily.     Marland Kitchen levothyroxine (SYNTHROID, LEVOTHROID) 88 MCG tablet Take 88 mcg by mouth daily before breakfast.    . Magnesium 250 MG TABS Take 250 mg by mouth daily.    . Multiple Vitamin (MULTIVITAMIN WITH MINERALS) TABS tablet Take 1 tablet by mouth daily.    . Omega-3 Fatty Acids (FISH OIL) 1000 MG CAPS Take 1 capsule by mouth daily.    . OXYGEN Inhale 2 L into the lungs as needed (for  shortness of breath with exertion only).     . pregabalin (LYRICA) 50 MG capsule Take 50 mg by mouth 2 (two) times daily.    Marland Kitchen terazosin (HYTRIN) 5 MG capsule Take 10 mg by mouth 2 (two) times daily.    . Turmeric 500 MG CAPS Take 500 mg by mouth daily.    . vitamin B-12 (CYANOCOBALAMIN) 1000 MCG tablet Take 1,000 mcg by mouth daily.       ROS:                                                                                                                                        ROS was performed and is negative except as noted in HPI    General Examination:                                                                                                      Weight 94.8 kg.  Physical Exam  Constitutional: Appears well-developed and well-nourished.  Eyes: Normal external eye and conjunctiva. HENT: Normocephalic, no lesions, without obvious abnormality.   Musculoskeletal-no joint tenderness, deformity or swelling Cardiovascular: Normal rate and regular rhythm.  Respiratory: Effort normal, non-labored breathing saturations WNL GI: Soft.  No distension. There is no tenderness.  Skin: WDI  Neurological Examination Mental Status: Alert, oriented, thought content appropriate.  Speech fluent without evidence of aphasia.  Able to follow  commands without difficulty. Cranial Nerves: ZO:XWRUEA fields grossly normal,  III,IV, VI: ptosis not present, extra-ocular motions intact bilaterally, pupils equal, round, reactive to light and accommodation V,VII: smile symmetric, left nasolabial fold flattening facial light touch sensation normal bilaterally VIII: hearing normal bilaterally IX,X: uvula rises midline XI: bilateral shoulder shrug XII: midline tongue extension Motor: Right : Upper extremity   5/5  Left:     Upper extremity   5/5  Lower extremity   5/5   Lower extremity   5/5 Tone and bulk:normal tone throughout; no atrophy  noted Sensory: Pinprick and light touch intact throughout, bilaterally Deep Tendon Reflexes: 2+ and symmetric biceps and patella Plantars: Right: downgoing   Left: downgoing Cerebellar: normal finger-to-nose, normal rapid alternating movements and normal heel-to-shin test Gait: normal gait and station   Lab Results: Basic Metabolic Panel: Recent Labs  Lab 05/10/20 1627  NA 143  K 3.9  CL 106  GLUCOSE 99  BUN 39*  CREATININE 1.80*    CBC: Recent Labs  Lab 05/10/20 1627  HGB 13.9  HCT 41.0     CBG: Recent Labs  Lab 05/10/20 1623  GLUCAP 101*    Imaging: CT HEAD CODE STROKE WO CONTRAST  Result Date: 05/10/2020 CLINICAL DATA:  Code stroke.  Left facial droop. EXAM: CT HEAD WITHOUT CONTRAST TECHNIQUE: Contiguous axial images were obtained from the base of the skull through the vertex without intravenous contrast. COMPARISON:  CT head 05/09/2018 FINDINGS: Brain: Generalized atrophy. Negative for acute infarct, hemorrhage, mass. No midline shift. Vascular: Negative for hyperdense vessel. Atherosclerotic calcification in the right vertebral artery and basilar and in both carotid arteries. Skull: Negative Sinuses/Orbits: Mild mucosal edema right maxillary sinus. Bilateral cataract extraction. No orbital mass lesion. Other: None ASPECTS ([place] Stroke Program Early CT Score) - Ganglionic level infarction (caudate, lentiform nuclei, internal capsule, insula, M1-M3 cortex): 7 - Supraganglionic infarction (M4-M6 cortex): 3 Total score (0-10 with 10 being normal): 10 IMPRESSION: 1. No acute abnormality 2. ASPECTS is 10 3. Moderate atrophy. 4. Preliminary report texted to Dr. Rory Percy via She[facility] Electronically Signed   By: Franchot Gallo M.D.   On: 05/10/2020 16:41   Laurey Morale, MSN, NP-C [facility] [phone]  05/10/2020, 4:46 PM   Attending physician note to follow with Assessment and plan .  Attending addendum Patient seen and examined as an acute code stroke for left-sided weakness and aphasia/confusion. 85 year old man past history of strokes, CKD 4, CAD, COPD, AAA, A. fib on Eliquis, also on antiplatelets for the CAD, presenting with complaints of aphasia and confusion-for which EMS was called.  He was sitting on the porch having wine with his friend who went into the bathroom and when she returned he sounded confused, not recognizing him and calling her by his deceased wife's name. She called EMS.  They came and they also observed some aphasia and also left-sided  facial droop, which improved on the way to the hospital. Our examination in the hospital here reveals if anything a questionable right facial droop. EMS reports that the facial droop completely resolved on the left side. He had no other deficits on my examination-cranial nerves II to XII intact, there is slow to respond to questions but no evidence of dysarthria or aphasia, no motor deficits, no sensory deficits, no coordination deficits I have independently reviewed imaging CT head with no acute changes CT angio head and neck shows progressed right internal carotid stenosis which is now critical. Posterior circulation also has multiple occlusions and arteries which are unchanged from prior.  Assessment: 85 y.o. male With PMH CVA (2018), HTN, MI, CAD, COPD, AAA,a. Fib (on eliquis) who presented to [facility] ED as a code stroke for c/o left side facial droop. CTH was negative for hemorrhage. Patient is not a candidate for TPA d/t being anticoagulated with eliquis. He does have a critically stenosed right internal carotid on CTA which has progressed from 2018.  There was already plan to revascularize his carotid but it was deferred due to deranged renal function. Given his low stroke scale, I would like to  obtain an MRI to see if there is a stroke, as it would be important to know if he has a large stroke or not. At this time, I would trust the EMS exam and the fact that he had a left-sided facial droop-for [place] he did have only a right-sided mild nasolabial fold flattening for which he got an NIH stroke scale of 1-which might be a spurious finding.   Left facial droop makes the right carotid lesion symptomatic and amenable for intervention, timing needs to be discussed with endovascular-see discussion below.  Not a candidate for TPA due to being on Eliquis and last dose within 48 hours Not a candidate for emergent EVT due to no clot and low stroke scale. Will be considered for vascularization of the right  carotid.  Impression: Right hemispheric stroke versus TIA   Recommendations: --MRI brain to evaluate for presence of stroke-this will be critical to know as there is an intervention for revascularization planned for Monday.  I would appreciate expediting this. --Continue his current antiplatelet  --Stop oral anticoagulation and start IV heparin drip for A. fib in anticipation for possible endovascular procedure on Monday --Stroke work-up to include 2D echo, A1c, lipid panel. --Frequent neurochecks --Telemetry --PT OT speech therapy --Allow for permissive hypertension at least till the carotid is vascularized -keep the blood pressures between 140-180 given significant carotid stenosis. --Discussed the case with endovascular-interventional radiology Dr. Estanislado Pandy.  We will stop his anticoagulation, start him on IV heparin for A. fib, continue his antiplatelet, complete the stroke work-up, hydrate him so that his renal function improves by Monday and Dr. Estanislado Pandy will take him in for a diagnostic cerebral angiogram with the intent to revascularize the right carotid on Monday. Dr. Estanislado Pandy is aware. Plan discussed with Dr. Ronnald Nian in the ER Stroke team to follow with you  --Please page the Stroke team from 8am-4pm.   You can look them up on [email or web address]    CRITICAL CARE ATTESTATION Performed by: Amie Portland, MD Total critical care time: 55 minutes Critical care time was exclusive of separately billable procedures and treating other patients and/or supervising APPs/Residents/Students Critical care was necessary to treat or prevent imminent or life-threatening deterioration due to possible acute ischemic stroke/TIA from critical right internal carotid stenosis, atrial fibrillation This patient is critically ill and at significant risk for neurological worsening and/or death and care requires constant monitoring. Critical care was time spent personally by me on the following activities:  development of treatment plan with patient and/or surrogate as well as nursing, discussions with consultants, evaluation of patient's response to treatment, examination of patient, obtaining history from patient or surrogate, ordering and performing treatments and interventions, ordering and review of laboratory studies, ordering and review of radiographic studies, pulse oximetry, re-evaluation of patient's condition, participation in multidisciplinary rounds and medical decision making of high complexity in the care of this patient.

## 2020-05-10 NOTE — Progress Notes (Signed)
PHARMACIST - PHYSICIAN ORDER COMMUNICATION  CONCERNING: P&T Medication Policy on Herbal Medications  DESCRIPTION:  This patient's order for: Tumeric, Glucosamine, Biotin  has been noted.  This product(s) is classified as an "herbal" or natural product. Due to a lack of definitive safety studies or FDA approval, nonstandard manufacturing practices, plus the potential risk of unknown drug-drug interactions while on inpatient medications, the Pharmacy and Therapeutics Committee does not permit the use of "herbal" or natural products of this type within [facility].   ACTION TAKEN: The pharmacy department is unable to verify this order at this time and your patient has been informed of this safety policy. Please reevaluate patient's clinical condition at discharge and address if the herbal or natural product(s) should be resumed at that time.   Azekiel Cremer S. Alford Highland, PharmD, BCPS Clinical Staff Pharmacist [email or web address]

## 2020-05-10 NOTE — Social Work (Signed)
CSW met with Pt at bedside Pt. Denied any alcohol abuse and refused any resources. Pt also stated that he does not wish for any home health services and prefers to be cared for by his girlfriend who lives in the same complex. Girlfriend states that she feels comfortable and capable of assisting Pt and that Pt owns blood pressure monitoring equipment as well as a finger pulse ox monitor.  Pt further reports that he already owns a cane and a walker.

## 2020-05-10 NOTE — Code Documentation (Signed)
Stroke Response Nurse Documentation Code Documentation  Derek [name] is a 85 y.o. male arriving to [facility] ED via [facility] on 05/10/2020 with past medical hx of CVA (2018), HTN, MI, CKD, headaches. Code stroke was activated by EMS. Patient from home where he was LKW at 50 where he was with his wife who witnessed onset of L facial droop and expressive aphasia and called 911. On aspirin 81 mg daily and Eliquis (apixaban) daily. Stroke team at the bedside on patient arrival. Labs drawn and patient cleared for CT by Dr. Ronnald Nian. Patient to CT with team. NIHSS 2, see documentation for details and code stroke times. Patient with right facial droop and dysarthria  on exam. The following imaging was completed:  CT Head, CTa. Patient is not a candidate for tPA due to being contraindicated. MRI ordered per Dr. Rory Percy. Bedside handoff with ED RN Crystal. Q2Hr mNIHSS and [place]  Stroke Response RN

## 2020-05-10 NOTE — H&P (Signed)
History and Physical    Derek [name] WUJ:[id number] DOB: [birth year] DOA: 05/10/2020  PCP: Merrilee Seashore, MD  Patient coming from: Home  I have personally briefly reviewed patient's old medical records in [place]  Chief Complaint: Trouble to find word  HPI: Derek [name] is a 85 y.o. male with medical history significant of CVA in 2018, paroxysmal A. fib on Eliquis, hypertension, CAD/MI, COPD, AAA, chronic alcohol use, presented with sudden onset of aphasia and left-sided facial droop.  Patient was having a picnic party in his yard and had about half cup of wine, about 20 to 30 minutes later, patient's girlfriend noticed patient suddenly stopped talking, and seems to have a left-sided facial droop.  Arrived about 20 to 30 minutes later, at that point patient still having trouble to find word and left-sided facial droop as well.  His symptoms much improved after arrived in ED. ED Course: CTA: critically stenosed (>70%) right internal carotid on CTA which has progressed from 2018. CT head negative for acute strokke.  Review of Systems: As per HPI otherwise 10 point review of systems negative.    Past Medical History:  Diagnosis Date  . AAA (abdominal aortic aneurysm) ([place])   . Arthritis   . Chronic kidney disease   . COPD (chronic obstructive pulmonary disease) ([place])   . Coronary artery disease   . Headache   . Hypertension   . Hypothyroidism   . Myocardial infarction ([place])   . Presence of permanent cardiac pacemaker   . Shortness of breath    on exertion;  has productive cough at times  . Sleep apnea    states mild . no cpap  . Stroke [facility])     Past Surgical History:  Procedure Laterality Date  . APPENDECTOMY  1963  . CATARACT EXTRACTION     w/ lid rise  . CORONARY ARTERY BYPASS GRAFT  2007  . EYE SURGERY Right March 2016   Cataract  . EYE SURGERY Bilateral May 2016   Eyebrow Lift  . EYE SURGERY Left June 2016   Cataract  . IR ANGIO INTRA EXTRACRAN SEL  COM CAROTID INNOMINATE BILAT MOD SED  09/12/2017  . IR ANGIO VERTEBRAL SEL SUBCLAVIAN INNOMINATE UNI R MOD SED  09/12/2017  . IR RADIOLOGIST EVAL & MGMT  12/07/2017  . KNEE ARTHROSCOPY  02/26/2012   Procedure: ARTHROSCOPY KNEE;  Surgeon: Sydnee Cabal, MD;  Location: [facility];  Service: Orthopedics;  Laterality: Left;  medial menisectomy and chondraplasty  . PACEMAKER IMPLANT N/A 10/04/2017   Procedure: PACEMAKER IMPLANT;  Surgeon: Deboraha Sprang, MD;  Location: [facility];  Service: Cardiovascular;  Laterality: N/A;     reports that he quit smoking about 20 years ago. His smoking use included cigarettes. He has a 38.25 pack-year smoking history. He has never used smokeless tobacco. He reports current alcohol use. He reports that he does not use drugs.  Allergies  Allergen Reactions  . Other Hives, Dermatitis and Other (See Comments)    -mycins  . Sulfa Antibiotics Rash  . Latex Rash  . Tape Rash and Other (See Comments)    Reaction to adhesive tape - pls use paper tape    Family History  Problem Relation Age of Onset  . Diabetes Mother   . Heart disease Mother        Before age 68  . Diabetes Sister   . Hypertension Sister   . Diabetes Brother   . Hypertension Brother   .  Hyperlipidemia Brother   . Hypertension Son   . Heart attack Daughter      Prior to Admission medications   Medication Sig Start Date End Date Taking? Authorizing Provider  acetaminophen (TYLENOL) 500 MG tablet Take 500 mg by mouth every 6 (six) hours as needed for moderate pain or headache.    [provider]  allopurinol (ZYLOPRIM) 100 MG tablet Take 1 tablet by mouth 2 (two) times a day. 03/23/19   [provider]  aspirin EC 81 MG tablet Take 81 mg by mouth daily after supper.    [provider]  atorvastatin (LIPITOR) 40 MG tablet Take 40 mg by mouth daily. 05/22/18   [provider]  Biotin 5000 MCG TABS Take 5,000 mcg by mouth daily.     [provider]  budesonide-formoterol (SYMBICORT) 80-4.5 MCG/ACT inhaler Take 2 puffs first thing in am and then another 2 puffs about 12 hours later. 11/21/19   Tanda Rockers, MD  ELIQUIS 2.5 MG TABS tablet TAKE 1 TABLET TWICE A DAY 04/03/19   Belva Crome, MD  ezetimibe (ZETIA) 10 MG tablet Take 10 mg by mouth daily after supper.     [provider]  famotidine (PEPCID) 20 MG tablet One at bedtime 08/01/18   Tanda Rockers, MD  fluticasone-salmeterol (ADVAIR HFA) [id number] MCG/ACT inhaler Inhale 2 puffs into the lungs 2 (two) times daily. 12/04/19   Tanda Rockers, MD  furosemide (LASIX) 20 MG tablet TAKE 1 TABLET DAILY AS NEEDED FOR SWELLING 01/15/20   Tanda Rockers, MD  GLUCOSAMINE-CHONDROITIN PO Take 1 tablet by mouth daily.     [provider]  levothyroxine (SYNTHROID, LEVOTHROID) 88 MCG tablet Take 88 mcg by mouth daily before breakfast.    [provider]  Magnesium 250 MG TABS Take 250 mg by mouth daily.    [provider]  Multiple Vitamin (MULTIVITAMIN WITH MINERALS) TABS tablet Take 1 tablet by mouth daily.    [provider]  Omega-3 Fatty Acids (FISH OIL) 1000 MG CAPS Take 1 capsule by mouth daily.    [provider]  OXYGEN Inhale 2 L into the lungs as needed (for  shortness of breath with exertion only).     [provider]  pregabalin (LYRICA) 50 MG capsule Take 50 mg by mouth 2 (two) times daily.    [provider]  terazosin (HYTRIN) 5 MG capsule Take 10 mg by mouth 2 (two) times daily. 08/30/17   [provider]  Turmeric 500 MG CAPS Take 500 mg by mouth daily.    [provider]  vitamin B-12 (CYANOCOBALAMIN) 1000 MCG tablet Take 1,000 mcg by mouth daily.    [provider]    Physical Exam: Vitals:   05/10/20 1730 05/10/20 1745 05/10/20 1749 05/10/20 1800  BP:   124/88   Pulse: 61 (!) 59    Resp: 13 13    Temp:   98 F (36.7 C)   TempSrc:   Oral   SpO2: 97%  95%    Weight:    94.8 kg  Height:    5\' 7"  (1.702 m)    Constitutional: NAD, calm, comfortable Vitals:   05/10/20 1730 05/10/20 1745 05/10/20 1749 05/10/20 1800  BP:   124/88   Pulse: 61 (!) 59    Resp: 13 13    Temp:   98 F (36.7 C)   TempSrc:   Oral   SpO2: 97% 95%    Weight:  94.8 kg  Height:    5\' 7"  (1.702 m)   Eyes: PERRL, lids and conjunctivae normal ENMT: Mucous membranes are moist. Posterior pharynx clear of any exudate or lesions.Normal dentition.  Neck: normal, supple, no masses, no thyromegaly Respiratory: clear to auscultation bilaterally, no wheezing, no crackles. Normal respiratory effort. No accessory muscle use.  Cardiovascular: Regular rate and rhythm, no murmurs / rubs / gallops. No extremity edema. 2+ pedal pulses. No carotid bruits.  Abdomen: no tenderness, no masses palpated. No hepatosplenomegaly. Bowel sounds positive.  Musculoskeletal: no clubbing / cyanosis. No joint deformity upper and lower extremities. Good ROM, no contractures. Normal muscle tone.  Skin: no rashes, lesions, ulcers. No induration Neurologic: CN 2-12 grossly intact. Sensation intact, DTR normal. Strength 5/5 in all 4.  Slight left-sided facial droop Psychiatric: Normal judgment and insight. Alert and oriented x 3. Normal mood.    Labs on Admission: I have personally reviewed following labs and imaging studies  CBC: Recent Labs  Lab 05/10/20 1627  WBC 5.6  NEUTROABS 2.4  HGB 13.4  13.9  HCT 41.1  41.0  MCV 100.7*  PLT 90*   Basic Metabolic Panel: Recent Labs  Lab 05/10/20 1627  NA 141  143  K 3.9  3.9  CL 107  106  CO2 22  GLUCOSE 112*  99  BUN 39*  39*  CREATININE 1.98*  1.80*  CALCIUM 9.1   GFR: Estimated Creatinine Clearance: 34.1 mL/min (A) (by C-G formula based on SCr of 1.8 mg/dL (H)). Liver Function Tests: Recent Labs  Lab 05/10/20 1627  AST 31  ALT 22  ALKPHOS 75  BILITOT 1.2  PROT 6.2*  ALBUMIN 3.6   No results for input(s): LIPASE,  AMYLASE in the last 168 hours. No results for input(s): AMMONIA in the last 168 hours. Coagulation Profile: Recent Labs  Lab 05/10/20 1627  INR 1.1   Cardiac Enzymes: No results for input(s): CKTOTAL, CKMB, CKMBINDEX, TROPONINI in the last 168 hours. BNP (last 3 results) No results for input(s): PROBNP in the last 8760 hours. HbA1C: No results for input(s): HGBA1C in the last 72 hours. CBG: Recent Labs  Lab 05/10/20 1623 05/10/20 1656  GLUCAP 101* 90   Lipid Profile: No results for input(s): CHOL, HDL, LDLCALC, TRIG, CHOLHDL, LDLDIRECT in the last 72 hours. Thyroid Function Tests: No results for input(s): TSH, T4TOTAL, FREET4, T3FREE, THYROIDAB in the last 72 hours. Anemia Panel: No results for input(s): VITAMINB12, FOLATE, FERRITIN, TIBC, IRON, RETICCTPCT in the last 72 hours. Urine analysis: No results found for: COLORURINE, APPEARANCEUR, LABSPEC, PHURINE, GLUCOSEU, HGBUR, BILIRUBINUR, KETONESUR, PROTEINUR, UROBILINOGEN, NITRITE, LEUKOCYTESUR  Radiological Exams on Admission: CT Code Stroke CTA Head W/WO contrast  Addendum Date: 05/10/2020   ADDENDUM REPORT: 05/10/2020 17:40 ADDENDUM: These results were called by telephone at the time of interpretation on 05/10/2020 at 5:40 pm to provider Dr. Ronnald Nian, who verbally acknowledged these results. Electronically Signed   By: Kellie Simmering DO   On: 05/10/2020 17:40   Result Date: 05/10/2020 CLINICAL DATA:  Neuro deficit, acute, stroke suspected. Additional provided: Left-sided facial droop, last known well 15:45 EXAM: CT ANGIOGRAPHY HEAD AND NECK TECHNIQUE: Multidetector CT imaging of the head and neck was performed using the standard protocol during bolus administration of intravenous contrast. Multiplanar CT image reconstructions and MIPs were obtained to evaluate the vascular anatomy. Carotid stenosis measurements (when applicable) are obtained utilizing NASCET criteria, using the distal internal carotid diameter as the denominator.  CONTRAST:  34mL OMNIPAQUE IOHEXOL 350 MG/ML SOLN  COMPARISON:  Noncontrast head CT performed earlier the same day 05/10/2020, brain MRI 10/03/2017, CT angiogram head/neck 09/11/2017 FINDINGS: CTA NECK FINDINGS Aortic arch: Standard aortic branching. Atherosclerotic plaque within the visualized aortic arch and proximal major branch vessels of the neck. No hemodynamically significant innominate or proximal subclavian artery stenosis. Right carotid system: The CCA is patent to the bifurcation without significant stenosis. Scattered calcified plaque within this vessel. There is prominent calcified plaque within the carotid bifurcation and proximal ICA. There is high-grade stenosis of the proximal ICA, exceeding 70%, which appears progressed as compared to prior CTA 09/11/2017. Distal to this, the ICA is patent within the neck without significant stenosis. Left carotid system: The CCA is patent to the bifurcation without significant stenosis. Scattered calcified plaque within this vessel. Moderate calcified plaque within the carotid bifurcation and proximal ICA. Unchanged 50% stenosis within the left ICA bulb. Vertebral arteries: The right vertebral artery is dominant. Redemonstrated prominent calcified plaque at the origin of the right vertebral artery. As before, the V1 and proximal V2 right vertebral artery are occluded. There is reconstitution of flow more distally within the right vertebral artery. Redemonstrated moderate stenosis at the origin of the non dominant left vertebral artery. More distally, the left vertebral artery is patent within the neck without significant stenosis. Skeleton: No acute bony abnormality or aggressive osseous lesion. Cervical spondylosis without high-grade bony spinal canal narrowing. Other neck: No neck mass or cervical lymphadenopathy. Upper chest: Emphysema within the imaged lung apices. Prior median sternotomy. Review of the MIP images confirms the above findings CTA HEAD FINDINGS  Anterior circulation: The intracranial internal carotid arteries are patent. Calcified plaque within both vessels. Unchanged sites of moderate atherosclerotic narrowing within the cavernous right ICA. No more than mild stenosis of the intracranial left ICA. The M1 middle cerebral arteries are patent. Mild atherosclerotic narrowing within the mid M1 left MCA (series 10, image 20). No M2 proximal branch occlusion or high-grade proximal stenosis is identified. Hypoplastic A1 right ACA. The anterior cerebral arteries are patent without significant proximal stenosis. 1-2 mm inferiorly projecting vascular protrusion arising from the supraclinoid left ICA which may reflect an infundibulum or tiny aneurysm (series 9, image 120). Posterior circulation: The dominant intracranial right vertebral arteries patent with scattered calcified plaque and sites of mild stenosis. The non dominant intracranial left vertebral artery is developmentally diminutive, but patent and terminates as the left PICA. The basilar artery is patent with multifocal calcified plaque and sites of mild stenosis. The posterior cerebral arteries are patent bilaterally. There is diffuse atherosclerotic irregularity of both vessels. Severe segmental stenosis within the distal P2 right PCA. Multifocal moderate stenoses within the proximal and distal left PCA. Posterior cerebral arteries are hypoplastic or absent bilaterally. Venous sinuses: Within limitations of contrast timing, no convincing thrombus. Anatomic variants: As described Review of the MIP images confirms the above findings IMPRESSION: CTA neck: 1. The bilateral common and internal carotid arteries are patent within the neck. There is high-grade (greater than 70%) stenosis of the proximal right ICA which appears progressed as compared to prior CTA 09/11/2017. Unchanged 50% stenosis within the left ICA bulb. 2. Redemonstrated prominent calcified plaque at the origin of the dominant right vertebral  artery. As before, the V1 and proximal V2 right vertebral artery are occluded with reconstitution more distally. Unchanged 50% stenosis at the origin of the non dominant left vertebral artery. CTA head: 1. No intracranial large vessel occlusion. 2. Intracranial atherosclerotic disease with multifocal stenoses, most notably as follows. 3. Moderate  atherosclerotic narrowing of the cavernous right ICA. 4. Diffuse atherosclerotic irregularity of both posterior cerebral arteries. Severe segmental stenosis within the distal P2 right PCA. Multifocal moderate stenoses within the proximal and distal left PCA. 5. 1-2 mm infundibulum versus tiny aneurysm arising from the supraclinoid left ICA. Electronically Signed: By: Kellie Simmering DO On: 05/10/2020 17:26   CT Code Stroke CTA Neck W/WO contrast  Addendum Date: 05/10/2020   ADDENDUM REPORT: 05/10/2020 17:40 ADDENDUM: These results were called by telephone at the time of interpretation on 05/10/2020 at 5:40 pm to provider Dr. Ronnald Nian, who verbally acknowledged these results. Electronically Signed   By: Kellie Simmering DO   On: 05/10/2020 17:40   Result Date: 05/10/2020 CLINICAL DATA:  Neuro deficit, acute, stroke suspected. Additional provided: Left-sided facial droop, last known well 15:45 EXAM: CT ANGIOGRAPHY HEAD AND NECK TECHNIQUE: Multidetector CT imaging of the head and neck was performed using the standard protocol during bolus administration of intravenous contrast. Multiplanar CT image reconstructions and MIPs were obtained to evaluate the vascular anatomy. Carotid stenosis measurements (when applicable) are obtained utilizing NASCET criteria, using the distal internal carotid diameter as the denominator. CONTRAST:  40mL OMNIPAQUE IOHEXOL 350 MG/ML SOLN COMPARISON:  Noncontrast head CT performed earlier the same day 05/10/2020, brain MRI 10/03/2017, CT angiogram head/neck 09/11/2017 FINDINGS: CTA NECK FINDINGS Aortic arch: Standard aortic branching. Atherosclerotic  plaque within the visualized aortic arch and proximal major branch vessels of the neck. No hemodynamically significant innominate or proximal subclavian artery stenosis. Right carotid system: The CCA is patent to the bifurcation without significant stenosis. Scattered calcified plaque within this vessel. There is prominent calcified plaque within the carotid bifurcation and proximal ICA. There is high-grade stenosis of the proximal ICA, exceeding 70%, which appears progressed as compared to prior CTA 09/11/2017. Distal to this, the ICA is patent within the neck without significant stenosis. Left carotid system: The CCA is patent to the bifurcation without significant stenosis. Scattered calcified plaque within this vessel. Moderate calcified plaque within the carotid bifurcation and proximal ICA. Unchanged 50% stenosis within the left ICA bulb. Vertebral arteries: The right vertebral artery is dominant. Redemonstrated prominent calcified plaque at the origin of the right vertebral artery. As before, the V1 and proximal V2 right vertebral artery are occluded. There is reconstitution of flow more distally within the right vertebral artery. Redemonstrated moderate stenosis at the origin of the non dominant left vertebral artery. More distally, the left vertebral artery is patent within the neck without significant stenosis. Skeleton: No acute bony abnormality or aggressive osseous lesion. Cervical spondylosis without high-grade bony spinal canal narrowing. Other neck: No neck mass or cervical lymphadenopathy. Upper chest: Emphysema within the imaged lung apices. Prior median sternotomy. Review of the MIP images confirms the above findings CTA HEAD FINDINGS Anterior circulation: The intracranial internal carotid arteries are patent. Calcified plaque within both vessels. Unchanged sites of moderate atherosclerotic narrowing within the cavernous right ICA. No more than mild stenosis of the intracranial left ICA. The M1  middle cerebral arteries are patent. Mild atherosclerotic narrowing within the mid M1 left MCA (series 10, image 20). No M2 proximal branch occlusion or high-grade proximal stenosis is identified. Hypoplastic A1 right ACA. The anterior cerebral arteries are patent without significant proximal stenosis. 1-2 mm inferiorly projecting vascular protrusion arising from the supraclinoid left ICA which may reflect an infundibulum or tiny aneurysm (series 9, image 120). Posterior circulation: The dominant intracranial right vertebral arteries patent with scattered calcified plaque and sites of mild stenosis.  The non dominant intracranial left vertebral artery is developmentally diminutive, but patent and terminates as the left PICA. The basilar artery is patent with multifocal calcified plaque and sites of mild stenosis. The posterior cerebral arteries are patent bilaterally. There is diffuse atherosclerotic irregularity of both vessels. Severe segmental stenosis within the distal P2 right PCA. Multifocal moderate stenoses within the proximal and distal left PCA. Posterior cerebral arteries are hypoplastic or absent bilaterally. Venous sinuses: Within limitations of contrast timing, no convincing thrombus. Anatomic variants: As described Review of the MIP images confirms the above findings IMPRESSION: CTA neck: 1. The bilateral common and internal carotid arteries are patent within the neck. There is high-grade (greater than 70%) stenosis of the proximal right ICA which appears progressed as compared to prior CTA 09/11/2017. Unchanged 50% stenosis within the left ICA bulb. 2. Redemonstrated prominent calcified plaque at the origin of the dominant right vertebral artery. As before, the V1 and proximal V2 right vertebral artery are occluded with reconstitution more distally. Unchanged 50% stenosis at the origin of the non dominant left vertebral artery. CTA head: 1. No intracranial large vessel occlusion. 2. Intracranial  atherosclerotic disease with multifocal stenoses, most notably as follows. 3. Moderate atherosclerotic narrowing of the cavernous right ICA. 4. Diffuse atherosclerotic irregularity of both posterior cerebral arteries. Severe segmental stenosis within the distal P2 right PCA. Multifocal moderate stenoses within the proximal and distal left PCA. 5. 1-2 mm infundibulum versus tiny aneurysm arising from the supraclinoid left ICA. Electronically Signed: By: Kellie Simmering DO On: 05/10/2020 17:26   DG Chest Portable 1 View  Result Date: 05/10/2020 CLINICAL DATA:  Altered mental status EXAM: PORTABLE CHEST 1 VIEW COMPARISON:  November 28, 2019 FINDINGS: There is cardiomegaly with pulmonary vascularity normal. There is mild interstitial thickening about edema or consolidation. There are scattered calcified granulomas. Pacemaker leads are attached to the right atrium and right ventricle. Patient is status post coronary artery bypass grafting. There is aortic atherosclerosis. No adenopathy. No bone lesions. IMPRESSION: Stable cardiomegaly. Scattered calcified granulomas. Mild interstitial thickening may represent a degree of underlying fibrotic type change. No edema or consolidation evident. Postoperative changes noted. Aortic Atherosclerosis (ICD10-I70.0). Electronically Signed   By: Lowella Grip III M.D.   On: 05/10/2020 16:56   CT HEAD CODE STROKE WO CONTRAST  Result Date: 05/10/2020 CLINICAL DATA:  Code stroke.  Left facial droop. EXAM: CT HEAD WITHOUT CONTRAST TECHNIQUE: Contiguous axial images were obtained from the base of the skull through the vertex without intravenous contrast. COMPARISON:  CT head 05/09/2018 FINDINGS: Brain: Generalized atrophy. Negative for acute infarct, hemorrhage, mass. No midline shift. Vascular: Negative for hyperdense vessel. Atherosclerotic calcification in the right vertebral artery and basilar and in both carotid arteries. Skull: Negative Sinuses/Orbits: Mild mucosal edema right  maxillary sinus. Bilateral cataract extraction. No orbital mass lesion. Other: None ASPECTS ([place] Stroke Program Early CT Score) - Ganglionic level infarction (caudate, lentiform nuclei, internal capsule, insula, M1-M3 cortex): 7 - Supraganglionic infarction (M4-M6 cortex): 3 Total score (0-10 with 10 being normal): 10 IMPRESSION: 1. No acute abnormality 2. ASPECTS is 10 3. Moderate atrophy. 4. Preliminary report texted to Dr. Rory Percy via [facility] Electronically Signed   By: Franchot Gallo M.D.   On: 05/10/2020 16:41    EKG: Independently reviewed.  V paced rhythm, although reported as accelerated junction and chronic RBBB  Assessment/Plan Active Problems:   * No active hospital problems. *  TIA with expressive aphasia and left-sided facial droop -ABCD2=3 put him at a rather  low risk stroke about 1% in 2 days, but given the significant finding on the CTA showing progressing narrowing of right proximal ICA to >70% (compared to 2018) -Neurology plans to talk to interventional neurosurgery about right ICA endarterectomy versus stenting.  Long discussion with patient regarding the upcoming procedure, patient was offered right-sided ICA when he had his first stroke in 2018, but because of the CKD status even at that time and the risk of contrast-induced further kidney failure and permanent dialysis, patient declined offer.  This time, patient remained adamant against any contrast involved procedure particularly stenting, citing the same rationale that he would never have dialysis regardless of benefit.  I have to defer further discussion to neurology and interventional. -As per neurology, will switch patient to heparin drip till a decision made about intervention. -Long discussion with patient and significant other at bedside -Patient has complicated medical problems and advanced vascular issues need to be addressed to prevent future stroke, likely will need more than 2 midnight hospital stay.  CKD stage  III -Renal level stable, start patient on hydration in case patient will need IV dye  Recent memory loss -Recommend outpatient evaluation for early dementia  Alcohol abuse -Patient girlfriend reported patient this afternoon had a usual amount of alcohol intake, no history of alcohol withdrawal, will put him on CIWA protocol for now with as needed benzo  A. Fib -Paced rhythm, on heparin drip  Thrombocytopenia -Chronic, stable, no symptoms signs of bleeding, continue to monitor  COPD -Stable, no acute issue -Continue home meds  Hypothyroidism -Check TSH  DVT prophylaxis: Heparin drip Code Status: DNR Family Communication: Girlfriend at bedside Disposition Plan: Barrier to discharge, neurology/interventional neurosurgery to talk to patient about procedure of right-sided endarterectomy versus other intracranial intervention. Consults called: Neurology Dr. Rolly Salter Admission status: Telemetry admission   Lequita Halt MD [facility] Pager [phone]   05/10/2020, 7:15 PM

## 2020-05-10 NOTE — Progress Notes (Signed)
Pt initially stated that he had his wallet with cash and cell phone. Pt looked through belongings and could not find items. Janett Billow (ED) called to ensure items not still in ED, she stated that she did not see them in room pt was in. Pt states that he thinks his girlfriend may have taken items with her.

## 2020-05-11 ENCOUNTER — Inpatient Hospital Stay (HOSPITAL_BASED_OUTPATIENT_CLINIC_OR_DEPARTMENT_OTHER): Payer: Medicare Other

## 2020-05-11 ENCOUNTER — Other Ambulatory Visit: Payer: Self-pay | Admitting: Neurology

## 2020-05-11 DIAGNOSIS — G459 Transient cerebral ischemic attack, unspecified: Secondary | ICD-10-CM | POA: Diagnosis not present

## 2020-05-11 DIAGNOSIS — I371 Nonrheumatic pulmonary valve insufficiency: Secondary | ICD-10-CM

## 2020-05-11 DIAGNOSIS — Z8673 Personal history of transient ischemic attack (TIA), and cerebral infarction without residual deficits: Secondary | ICD-10-CM | POA: Diagnosis not present

## 2020-05-11 DIAGNOSIS — I6300 Cerebral infarction due to thrombosis of unspecified precerebral artery: Secondary | ICD-10-CM

## 2020-05-11 DIAGNOSIS — I10 Essential (primary) hypertension: Secondary | ICD-10-CM | POA: Diagnosis not present

## 2020-05-11 DIAGNOSIS — I639 Cerebral infarction, unspecified: Secondary | ICD-10-CM | POA: Diagnosis not present

## 2020-05-11 LAB — CBC
HCT: 41.2 % (ref 39.0–52.0)
Hemoglobin: 13.5 g/dL (ref 13.0–17.0)
MCH: 32.5 pg (ref 26.0–34.0)
MCHC: 32.8 g/dL (ref 30.0–36.0)
MCV: 99 fL (ref 80.0–100.0)
Platelets: 96 10*3/uL — ABNORMAL LOW (ref 150–400)
RBC: 4.16 MIL/uL — ABNORMAL LOW (ref 4.22–5.81)
RDW: 14.5 % (ref 11.5–15.5)
WBC: 5.5 10*3/uL (ref 4.0–10.5)
nRBC: 0 % (ref 0.0–0.2)

## 2020-05-11 LAB — LIPID PANEL
Cholesterol: 121 mg/dL (ref 0–200)
HDL: 48 mg/dL (ref >40)
LDL Cholesterol: 61 mg/dL (ref 0–99)
Total CHOL/HDL Ratio: 2.5 RATIO
Triglycerides: 62 mg/dL (ref <150)
VLDL: 12 mg/dL (ref 0–40)

## 2020-05-11 LAB — ECHOCARDIOGRAM COMPLETE
Height: 69 in
Weight: 3238.4 oz

## 2020-05-11 LAB — RAPID URINE DRUG SCREEN, HOSP PERFORMED
Amphetamines: NOT DETECTED
Barbiturates: NOT DETECTED
Benzodiazepines: NOT DETECTED
Cocaine: NOT DETECTED
Opiates: NOT DETECTED
Tetrahydrocannabinol: NOT DETECTED

## 2020-05-11 LAB — APTT
aPTT: 78 seconds — ABNORMAL HIGH (ref 24–36)
aPTT: 126 seconds — ABNORMAL HIGH (ref 24–36)

## 2020-05-11 LAB — HEMOGLOBIN A1C
Hgb A1c MFr Bld: 5.9 % — ABNORMAL HIGH (ref 4.8–5.6)
Mean Plasma Glucose: 122.63 mg/dL

## 2020-05-11 LAB — HEPARIN LEVEL (UNFRACTIONATED): Heparin Unfractionated: 1.22 IU/mL — ABNORMAL HIGH (ref 0.30–0.70)

## 2020-05-11 LAB — TSH: TSH: 1.182 u[IU]/mL (ref 0.350–4.500)

## 2020-05-11 MED ORDER — SODIUM CHLORIDE 0.9 % IV SOLN: INTRAVENOUS | Status: DC

## 2020-05-11 MED ORDER — TICAGRELOR 90 MG PO TABS
90.0000 mg | ORAL_TABLET | Freq: Two times a day (BID) | ORAL | Status: DC
  Administered 2020-05-11: 90 mg via ORAL
  Filled 2020-05-11: qty 1

## 2020-05-11 NOTE — Discharge Summary (Signed)
Physician Discharge Summary  Derek [name]:[id number] DOB: 01-28-1935 DOA: 05/10/2020  PCP: Merrilee Seashore, MD  Admit date: 05/10/2020 Discharge date: 05/11/2020  Admitted From: Home Disposition: Home Recommendations for Outpatient Follow-up:  1. Follow up with PCP in 1-2 weeks 2. Please obtain BMP/CBC in one week 3    Please follow up with Dr. Estanislado Pandy  4    Please follow-up with [facility] neurology Dr. Leonie Man   Home Health:ot Equipment/Devices none  Discharge Condition: Stable CODE STATUS: DNR Diet recommendation: Cardiac Brief/Interim Summary: 85 y.o. male with medical history significant of CVA in 2018, paroxysmal A. fib on Eliquis, hypertension, CAD/MI, COPD, AAA, chronic alcohol use, presented with sudden onset of aphasia and left-sided facial droop.  Patient was having a picnic party in his yard and had about half cup of wine, about 20 to 30 minutes later, patient's girlfriend noticed patient suddenly stopped talking, and seems to have a left-sided facial droop.  Arrived about 20 to 30 minutes later, at that point patient still having trouble to find word and left-sided facial droop as well.  His symptoms much improved after arrived in ED. ED Course: CTA: critically stenosed (>70%) right internal carotid on CTA which has progressed from 2018. CT head negative for acute strokke.   Discharge Diagnoses:  Active Problems:   CVA (cerebral vascular accident) ([place])   #1 TIA resolved.  CT head no acute abnormality.  MRI of the brain was not done as patient was adamant about going home, and he has a pacemaker in place so MRI could not be done till Monday.CTA H&N - There is high-grade (greater than 70%) stenosis of the proximal right ICA which appears progressed as compared to prior CTA 09/11/2017. Redemonstrated prominent calcified plaque at the origin of the dominant right vertebral artery. V1 and proximal V2 right vertebral artery are occluded with reconstitution more distally. 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 Sars Corona Virus 2 - negative LDL - 61 HgbA1c - 5.9 UDS neg Echo with normal ejection fraction no wall motion abnormalities. He was seen in consultation by neurology.  He will be discharged on Eliquis and aspirin 81 mg daily. He was asked to follow-up with Dr. Estanislado Pandy as an outpatient for right ICA 70% stenosis.  Patient was adamant about not having any stent placed as he was afraid that he would have kidney failure from the dye.  #2 CKD stable follow-up with nephrology as an outpatient  #3 hypertension continue Hytrin.  Estimated body mass index is 29.89 kg/m as calculated from the following:   Height as of this encounter: 5\' 9"  (1.753 m).   Weight as of this encounter: 91.8 kg.  Discharge Instructions  Discharge Instructions    Ambulatory referral to Neurology   Complete by: As directed    Follow up with Dr. Leonie Man at [facility] in 4-6 weeks. Too complicated for NP to follow. Thanks.     Allergies as of 05/11/2020      Reactions   Other Hives, Dermatitis, Other (See Comments)   -mycins   Sulfa Antibiotics Rash   Latex Rash   Tape Rash, Other (See Comments)   Reaction to adhesive tape - pls use paper tape      Medication List    TAKE these medications   acetaminophen 500 MG tablet Commonly known as: TYLENOL Take 500 mg by mouth every 6 (six) hours as needed for moderate pain or headache.   Advair HFA 115-21 MCG/ACT inhaler Generic  drug: fluticasone-salmeterol Inhale 2 puffs into the lungs 2 (two) times daily.   allopurinol 100 MG tablet Commonly known as: ZYLOPRIM Take 1 tablet by mouth 2 (two) times a day.   aspirin EC 81 MG tablet Take 81 mg by mouth daily after supper.   atorvastatin 40 MG tablet Commonly known as: LIPITOR Take 40 mg by mouth daily.   Biotin 5000 MCG Tabs Take 5,000 mcg by mouth daily.   budesonide-formoterol 80-4.5 MCG/ACT inhaler Commonly known as: Symbicort Take 2 puffs  first thing in am and then another 2 puffs about 12 hours later. What changed:   how much to take  how to take this  when to take this  reasons to take this  additional instructions   Eliquis 2.5 MG Tabs tablet Generic drug: apixaban TAKE 1 TABLET TWICE A DAY What changed: how much to take   ezetimibe 10 MG tablet Commonly known as: ZETIA Take 10 mg by mouth daily after supper.   famotidine 20 MG tablet Commonly known as: Pepcid One at bedtime What changed:   how much to take  how to take this  when to take this   Fish Oil 1000 MG Caps Take 1,000 mg by mouth daily.   fluticasone 50 MCG/ACT nasal spray Commonly known as: FLONASE Place 1 spray into both nostrils daily as needed for allergies or rhinitis.   furosemide 20 MG tablet Commonly known as: LASIX TAKE 1 TABLET DAILY AS NEEDED FOR SWELLING What changed: See the new instructions.   levothyroxine 88 MCG tablet Commonly known as: SYNTHROID Take 88 mcg by mouth daily before breakfast.   Magnesium 250 MG Tabs Take 250 mg by mouth daily.   multivitamin with minerals Tabs tablet Take 1 tablet by mouth daily.   OXYGEN Inhale 2 L into the lungs as needed (for  shortness of breath with exertion only).   pregabalin 50 MG capsule Commonly known as: LYRICA Take 50 mg by mouth daily.   terazosin 5 MG capsule Commonly known as: HYTRIN Take 10 mg by mouth 2 (two) times daily.   Turmeric 500 MG Caps Take 500 mg by mouth daily.   vitamin B-12 1000 MCG tablet Commonly known as: CYANOCOBALAMIN Take 1,000 mcg by mouth daily.      Follow-up Information    Garvin Fila, MD. Schedule an appointment as soon as possible for a visit in 4 week(s).   Specialties: Neurology, Radiology Contact information: [address] [place] [address] [phone]        Luanne Bras, MD. Schedule an appointment as soon as possible for a visit in 1 week(s).   Specialties: Interventional  Radiology, Radiology Contact information: [place] [address] [phone]        Merrilee Seashore, MD Follow up.   Specialty: Internal Medicine Contact information: [address] [place] [address] [phone]        Belva Crome, MD .   Specialty: Cardiology Contact information: [phone] [place] [address] [phone]          Allergies  Allergen Reactions  . Other Hives, Dermatitis and Other (See Comments)    -mycins  . Sulfa Antibiotics Rash  . Latex Rash  . Tape Rash and Other (See Comments)    Reaction to adhesive tape - pls use paper tape    Consultations: Neurology  Procedures/Studies: CT Code Stroke CTA Head W/WO contrast  Addendum Date: 05/10/2020  ADDENDUM REPORT: 05/10/2020 17:40 ADDENDUM: These results were called by telephone at the time of interpretation on 05/10/2020 at 5:40 pm to provider Dr. Ronnald Nian, who verbally acknowledged these results. Electronically Signed   By: Kellie Simmering DO   On: 05/10/2020 17:40   Result Date: 05/10/2020 CLINICAL DATA:  Neuro deficit, acute, stroke suspected. Additional provided: Left-sided facial droop, last known well 15:45 EXAM: CT ANGIOGRAPHY HEAD AND NECK TECHNIQUE: Multidetector CT imaging of the head and neck was performed using the standard protocol during bolus administration of intravenous contrast. Multiplanar CT image reconstructions and MIPs were obtained to evaluate the vascular anatomy. Carotid stenosis measurements (when applicable) are obtained utilizing NASCET criteria, using the distal internal carotid diameter as the denominator. CONTRAST:  85mL OMNIPAQUE IOHEXOL 350 MG/ML SOLN COMPARISON:  Noncontrast head CT performed earlier the same day 05/10/2020, brain MRI 10/03/2017, CT angiogram head/neck 09/11/2017 FINDINGS: CTA NECK FINDINGS Aortic arch: Standard aortic branching. Atherosclerotic plaque within the visualized aortic arch and  proximal major branch vessels of the neck. No hemodynamically significant innominate or proximal subclavian artery stenosis. Right carotid system: The CCA is patent to the bifurcation without significant stenosis. Scattered calcified plaque within this vessel. There is prominent calcified plaque within the carotid bifurcation and proximal ICA. There is high-grade stenosis of the proximal ICA, exceeding 70%, which appears progressed as compared to prior CTA 09/11/2017. Distal to this, the ICA is patent within the neck without significant stenosis. Left carotid system: The CCA is patent to the bifurcation without significant stenosis. Scattered calcified plaque within this vessel. Moderate calcified plaque within the carotid bifurcation and proximal ICA. Unchanged 50% stenosis within the left ICA bulb. Vertebral arteries: The right vertebral artery is dominant. Redemonstrated prominent calcified plaque at the origin of the right vertebral artery. As before, the V1 and proximal V2 right vertebral artery are occluded. There is reconstitution of flow more distally within the right vertebral artery. Redemonstrated moderate stenosis at the origin of the non dominant left vertebral artery. More distally, the left vertebral artery is patent within the neck without significant stenosis. Skeleton: No acute bony abnormality or aggressive osseous lesion. Cervical spondylosis without high-grade bony spinal canal narrowing. Other neck: No neck mass or cervical lymphadenopathy. Upper chest: Emphysema within the imaged lung apices. Prior median sternotomy. Review of the MIP images confirms the above findings CTA HEAD FINDINGS Anterior circulation: The intracranial internal carotid arteries are patent. Calcified plaque within both vessels. Unchanged sites of moderate atherosclerotic narrowing within the cavernous right ICA. No more than mild stenosis of the intracranial left ICA. The M1 middle cerebral arteries are patent. Mild  atherosclerotic narrowing within the mid M1 left MCA (series 10, image 20). No M2 proximal branch occlusion or high-grade proximal stenosis is identified. Hypoplastic A1 right ACA. The anterior cerebral arteries are patent without significant proximal stenosis. 1-2 mm inferiorly projecting vascular protrusion arising from the supraclinoid left ICA which may reflect an infundibulum or tiny aneurysm (series 9, image 120). Posterior circulation: The dominant intracranial right vertebral arteries patent with scattered calcified plaque and sites of mild stenosis. The non dominant intracranial left vertebral artery is developmentally diminutive, but patent and terminates as the left PICA. The basilar artery is patent with multifocal calcified plaque and sites of mild stenosis. The posterior cerebral arteries are patent bilaterally. There is diffuse atherosclerotic irregularity of both vessels. Severe segmental stenosis within the distal P2 right PCA. Multifocal moderate stenoses within the proximal and distal left PCA. Posterior cerebral arteries are hypoplastic or absent  bilaterally. Venous sinuses: Within limitations of contrast timing, no convincing thrombus. Anatomic variants: As described Review of the MIP images confirms the above findings IMPRESSION: CTA neck: 1. The bilateral common and internal carotid arteries are patent within the neck. There is high-grade (greater than 70%) stenosis of the proximal right ICA which appears progressed as compared to prior CTA 09/11/2017. Unchanged 50% stenosis within the left ICA bulb. 2. Redemonstrated prominent calcified plaque at the origin of the dominant right vertebral artery. As before, the V1 and proximal V2 right vertebral artery are occluded with reconstitution more distally. Unchanged 50% stenosis at the origin of the non dominant left vertebral artery. CTA head: 1. No intracranial large vessel occlusion. 2. Intracranial atherosclerotic disease with multifocal  stenoses, most notably as follows. 3. Moderate atherosclerotic narrowing of the cavernous right ICA. 4. Diffuse atherosclerotic irregularity of both posterior cerebral arteries. Severe segmental stenosis within the distal P2 right PCA. Multifocal moderate stenoses within the proximal and distal left PCA. 5. 1-2 mm infundibulum versus tiny aneurysm arising from the supraclinoid left ICA. Electronically Signed: By: Kellie Simmering DO On: 05/10/2020 17:26   CT Code Stroke CTA Neck W/WO contrast  Addendum Date: 05/10/2020   ADDENDUM REPORT: 05/10/2020 17:40 ADDENDUM: These results were called by telephone at the time of interpretation on 05/10/2020 at 5:40 pm to provider Dr. Ronnald Nian, who verbally acknowledged these results. Electronically Signed   By: Kellie Simmering DO   On: 05/10/2020 17:40   Result Date: 05/10/2020 CLINICAL DATA:  Neuro deficit, acute, stroke suspected. Additional provided: Left-sided facial droop, last known well 15:45 EXAM: CT ANGIOGRAPHY HEAD AND NECK TECHNIQUE: Multidetector CT imaging of the head and neck was performed using the standard protocol during bolus administration of intravenous contrast. Multiplanar CT image reconstructions and MIPs were obtained to evaluate the vascular anatomy. Carotid stenosis measurements (when applicable) are obtained utilizing NASCET criteria, using the distal internal carotid diameter as the denominator. CONTRAST:  7mL OMNIPAQUE IOHEXOL 350 MG/ML SOLN COMPARISON:  Noncontrast head CT performed earlier the same day 05/10/2020, brain MRI 10/03/2017, CT angiogram head/neck 09/11/2017 FINDINGS: CTA NECK FINDINGS Aortic arch: Standard aortic branching. Atherosclerotic plaque within the visualized aortic arch and proximal major branch vessels of the neck. No hemodynamically significant innominate or proximal subclavian artery stenosis. Right carotid system: The CCA is patent to the bifurcation without significant stenosis. Scattered calcified plaque within this  vessel. There is prominent calcified plaque within the carotid bifurcation and proximal ICA. There is high-grade stenosis of the proximal ICA, exceeding 70%, which appears progressed as compared to prior CTA 09/11/2017. Distal to this, the ICA is patent within the neck without significant stenosis. Left carotid system: The CCA is patent to the bifurcation without significant stenosis. Scattered calcified plaque within this vessel. Moderate calcified plaque within the carotid bifurcation and proximal ICA. Unchanged 50% stenosis within the left ICA bulb. Vertebral arteries: The right vertebral artery is dominant. Redemonstrated prominent calcified plaque at the origin of the right vertebral artery. As before, the V1 and proximal V2 right vertebral artery are occluded. There is reconstitution of flow more distally within the right vertebral artery. Redemonstrated moderate stenosis at the origin of the non dominant left vertebral artery. More distally, the left vertebral artery is patent within the neck without significant stenosis. Skeleton: No acute bony abnormality or aggressive osseous lesion. Cervical spondylosis without high-grade bony spinal canal narrowing. Other neck: No neck mass or cervical lymphadenopathy. Upper chest: Emphysema within the imaged lung apices. Prior median sternotomy. Review  of the MIP images confirms the above findings CTA HEAD FINDINGS Anterior circulation: The intracranial internal carotid arteries are patent. Calcified plaque within both vessels. Unchanged sites of moderate atherosclerotic narrowing within the cavernous right ICA. No more than mild stenosis of the intracranial left ICA. The M1 middle cerebral arteries are patent. Mild atherosclerotic narrowing within the mid M1 left MCA (series 10, image 20). No M2 proximal branch occlusion or high-grade proximal stenosis is identified. Hypoplastic A1 right ACA. The anterior cerebral arteries are patent without significant proximal  stenosis. 1-2 mm inferiorly projecting vascular protrusion arising from the supraclinoid left ICA which may reflect an infundibulum or tiny aneurysm (series 9, image 120). Posterior circulation: The dominant intracranial right vertebral arteries patent with scattered calcified plaque and sites of mild stenosis. The non dominant intracranial left vertebral artery is developmentally diminutive, but patent and terminates as the left PICA. The basilar artery is patent with multifocal calcified plaque and sites of mild stenosis. The posterior cerebral arteries are patent bilaterally. There is diffuse atherosclerotic irregularity of both vessels. Severe segmental stenosis within the distal P2 right PCA. Multifocal moderate stenoses within the proximal and distal left PCA. Posterior cerebral arteries are hypoplastic or absent bilaterally. Venous sinuses: Within limitations of contrast timing, no convincing thrombus. Anatomic variants: As described Review of the MIP images confirms the above findings IMPRESSION: CTA neck: 1. The bilateral common and internal carotid arteries are patent within the neck. There is high-grade (greater than 70%) stenosis of the proximal right ICA which appears progressed as compared to prior CTA 09/11/2017. Unchanged 50% stenosis within the left ICA bulb. 2. Redemonstrated prominent calcified plaque at the origin of the dominant right vertebral artery. As before, the V1 and proximal V2 right vertebral artery are occluded with reconstitution more distally. Unchanged 50% stenosis at the origin of the non dominant left vertebral artery. CTA head: 1. No intracranial large vessel occlusion. 2. Intracranial atherosclerotic disease with multifocal stenoses, most notably as follows. 3. Moderate atherosclerotic narrowing of the cavernous right ICA. 4. Diffuse atherosclerotic irregularity of both posterior cerebral arteries. Severe segmental stenosis within the distal P2 right PCA. Multifocal moderate  stenoses within the proximal and distal left PCA. 5. 1-2 mm infundibulum versus tiny aneurysm arising from the supraclinoid left ICA. Electronically Signed: By: Kellie Simmering DO On: 05/10/2020 17:26   DG Chest Portable 1 View  Result Date: 05/10/2020 CLINICAL DATA:  Altered mental status EXAM: PORTABLE CHEST 1 VIEW COMPARISON:  November 28, 2019 FINDINGS: There is cardiomegaly with pulmonary vascularity normal. There is mild interstitial thickening about edema or consolidation. There are scattered calcified granulomas. Pacemaker leads are attached to the right atrium and right ventricle. Patient is status post coronary artery bypass grafting. There is aortic atherosclerosis. No adenopathy. No bone lesions. IMPRESSION: Stable cardiomegaly. Scattered calcified granulomas. Mild interstitial thickening may represent a degree of underlying fibrotic type change. No edema or consolidation evident. Postoperative changes noted. Aortic Atherosclerosis (ICD10-I70.0). Electronically Signed   By: Lowella Grip III M.D.   On: 05/10/2020 16:56   ECHOCARDIOGRAM COMPLETE  Result Date: 05/11/2020    ECHOCARDIOGRAM REPORT   Patient Name:   Derek [name] Date of Exam: 05/11/2020 Medical Rec #:  [id number]      Height:       69.0 in Accession #:    [id number]     Weight:       202.4 lb Date of Birth:  01-09-1935       BSA:  2.077 m Patient Age:    42 years       BP:           132/63 mmHg Patient Gender: M              HR:           60 bpm. Exam Location:  Inpatient Procedure: 2D Echo Indications:    TIA 435.9 / G45.9  History:        Patient has prior history of Echocardiogram examinations, most                 recent 09/13/2017. CAD and Previous Myocardial Infarction, COPD                 and Stroke, Arrythmias:Atrial Fibrillation; Risk Factors:Former                 Smoker and Sleep Apnea. AAA.  Sonographer:    Darlina Sicilian RDCS Referring Phys: [id number] [place]  1. Severe aneurysm of the descening  aorta.  2. Left ventricular ejection fraction, by estimation, is 60 to 65%. The left ventricle has normal function. The left ventricle has no regional wall motion abnormalities. There is mild concentric left ventricular hypertrophy. Left ventricular diastolic parameters are indeterminate.  3. Right ventricular systolic function is normal. The right ventricular size is normal. There is normal pulmonary artery systolic pressure.  4. Left atrial size was severely dilated.  5. The mitral valve is normal in structure. No evidence of mitral valve regurgitation. No evidence of mitral stenosis.  6. The aortic valve is tricuspid. Aortic valve regurgitation is not visualized. No aortic stenosis is present.  7. Aortic dilatation noted. 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 There is mild dilatation of the ascending aorta measuring 37 mm.  8. The inferior vena cava is dilated in size with >50% respiratory variability, suggesting right atrial pressure of 8 mmHg. FINDINGS  Left Ventricle: Left ventricular ejection fraction, by estimation, is 60 to 65%. The left ventricle has normal function. The left ventricle has no regional wall motion abnormalities. The left ventricular internal cavity size was normal in size. There is  mild concentric left ventricular hypertrophy. Left ventricular diastolic parameters are indeterminate. Indeterminate filling pressures. Right Ventricle: The right ventricular size is normal. No increase in right ventricular wall thickness. Right ventricular systolic function is normal. There is normal pulmonary artery systolic pressure. The tricuspid regurgitant velocity is 2.14 m/s, and  with an assumed right atrial pressure of 8 mmHg, the estimated right ventricular systolic pressure is 01.6 mmHg. Left Atrium: Left atrial size was severely dilated. Right Atrium: Right atrial size was normal in size. Pericardium: There is no evidence of pericardial effusion. Mitral Valve: The mitral valve is normal  in structure. Normal mobility of the mitral valve leaflets. No evidence of mitral valve regurgitation. No evidence of mitral valve stenosis. Tricuspid Valve: The tricuspid valve is normal in structure. Tricuspid valve regurgitation is trivial. No evidence of tricuspid stenosis. Aortic Valve: The aortic valve is tricuspid. Aortic valve regurgitation is not visualized. No aortic stenosis is present. Pulmonic Valve: The pulmonic valve was normal in structure. Pulmonic valve regurgitation is mild to moderate. No evidence of pulmonic stenosis. Aorta: Aortic dilatation noted. There is mild dilatation of the ascending aorta measuring 37 mm. There is an aneurysm involving the descending aorta. The aneurysm measures 55 mm. Venous: The inferior vena cava is dilated in size with greater than 50% respiratory variability, suggesting right  atrial pressure of 8 mmHg. IAS/Shunts: No atrial level shunt detected by color flow Doppler.  LEFT VENTRICLE PLAX 2D LVIDd:         4.50 cm LVIDs:         2.90 cm LV PW:         1.30 cm LV IVS:        1.30 cm LVOT diam:     2.30 cm LV SV:         66 LV SV Index:   32 LVOT Area:     4.15 cm  LEFT ATRIUM             Index LA diam:        5.50 cm 2.65 cm/m LA Vol (A2C):   63.9 ml 30.77 ml/m LA Vol (A4C):   87.1 ml 41.95 ml/m LA Biplane Vol: 76.5 ml 36.84 ml/m  AORTIC VALVE LVOT Vmax:   99.20 cm/s LVOT Vmean:  63.700 cm/s LVOT VTI:    0.159 m  AORTA Ao Root diam: 3.80 cm MITRAL VALVE               TRICUSPID VALVE MV Area (PHT): 2.95 cm    TR Peak grad:   18.3 mmHg MV Decel Time: 257 msec    TR Vmax:        214.00 cm/s MV E velocity: 78.00 cm/s                            SHUNTS                            Systemic VTI:  0.16 m                            Systemic Diam: 2.30 cm Skeet Latch MD Electronically signed by Skeet Latch MD Signature Date/Time: 05/11/2020/12:25:21 PM    Final    CUP PACEART REMOTE DEVICE CHECK  Result Date: 04/19/2020 Scheduled remote reviewed. Normal device  function.  Next remote 91 days. Felisa Bonier, RN, MSN  CT HEAD CODE STROKE WO CONTRAST  Result Date: 05/10/2020 CLINICAL DATA:  Code stroke.  Left facial droop. EXAM: CT HEAD WITHOUT CONTRAST TECHNIQUE: Contiguous axial images were obtained from the base of the skull through the vertex without intravenous contrast. COMPARISON:  CT head 05/09/2018 FINDINGS: Brain: Generalized atrophy. Negative for acute infarct, hemorrhage, mass. No midline shift. Vascular: Negative for hyperdense vessel. Atherosclerotic calcification in the right vertebral artery and basilar and in both carotid arteries. Skull: Negative Sinuses/Orbits: Mild mucosal edema right maxillary sinus. Bilateral cataract extraction. No orbital mass lesion. Other: None ASPECTS ([place] Stroke Program Early CT Score) - Ganglionic level infarction (caudate, lentiform nuclei, internal capsule, insula, M1-M3 cortex): 7 - Supraganglionic infarction (M4-M6 cortex): 3 Total score (0-10 with 10 being normal): 10 IMPRESSION: 1. No acute abnormality 2. ASPECTS is 10 3. Moderate atrophy. 4. Preliminary report texted to Dr. Rory Percy via [facility] Electronically Signed   By: Franchot Gallo M.D.   On: 05/10/2020 16:41    (Echo, Carotid, EGD, Colonoscopy, ERCP)    Subjective:  Patient awake alert back to baseline answer all my questions appropriately he is very anxious to go home.  Discussed with his significant other. Discharge Exam: Vitals:   05/11/20 1200 05/11/20 1601  BP: 126/68 (!) 156/85  Pulse: 61 61  Resp:  18  Temp:  98 F (36.7 C)  SpO2:  96%   Vitals:   05/11/20 0801 05/11/20 1153 05/11/20 1200 05/11/20 1601  BP: 132/63 126/68 126/68 (!) 156/85  Pulse: 60 61 61 61  Resp: 18 18  18   Temp: 98.1 F (36.7 C) 98.2 F (36.8 C)  98 F (36.7 C)  TempSrc: Oral Oral  Oral  SpO2: 92% 93%  96%  Weight:      Height:        General: Pt is alert, awake, not in acute distress Cardiovascular: RRR, S1/S2 +, no rubs, no gallops Respiratory: CTA  bilaterally, no wheezing, no rhonchi Abdominal: Soft, NT, ND, bowel sounds + Extremities: no edema, no cyanosis    The results of significant diagnostics from this hospitalization (including imaging, microbiology, ancillary and laboratory) are listed below for reference.     Microbiology: Recent Results (from the past 240 hour(s))  SARS Coronavirus 2 by RT PCR (hospital order, performed in [facility] hospital lab) Nasopharyngeal Nasopharyngeal Swab     Status: None   Collection Time: 05/10/20  4:56 PM   Specimen: Nasopharyngeal Swab  Result Value Ref Range Status   SARS Coronavirus 2 NEGATIVE NEGATIVE Final    Comment: (NOTE) SARS-CoV-2 target nucleic acids are NOT DETECTED. The SARS-CoV-2 RNA is generally detectable in upper and lower respiratory specimens during the acute phase of infection. The lowest concentration of SARS-CoV-2 viral copies this assay can detect is 250 copies / mL. A negative result does not preclude SARS-CoV-2 infection and should not be used as the sole basis for treatment or other patient management decisions.  A negative result may occur with improper specimen collection / handling, submission of specimen other than nasopharyngeal swab, presence of viral mutation(s) within the areas targeted by this assay, and inadequate number of viral copies (<250 copies / mL). A negative result must be combined with clinical observations, patient history, and epidemiological information. Fact Sheet for Patients:   StrictlyIdeas.no Fact Sheet for Healthcare Providers: BankingDealers.co.za This test is not yet approved or cleared  by the [place] FDA and has been authorized for detection and/or diagnosis of SARS-CoV-2 by FDA under an Emergency Use Authorization (EUA).  This EUA will remain in effect (meaning this test can be used) for the duration of the COVID-19 declaration under Section 564(b)(1) of the Act, 21  U.S.C. section 360bbb-3(b)(1), unless the authorization is terminated or revoked sooner. Performed at [facility], [place] [address] [place], [place] [address]      Labs: BNP (last 3 results) No results for input(s): BNP in the last 8760 hours. Basic Metabolic Panel: Recent Labs  Lab 05/10/20 1627 05/10/20 2120  NA 141  143 141  K 3.9  3.9 4.7  CL 107  106 104  CO2 22 24  GLUCOSE 112*  99 94  BUN 39*  39* 39*  CREATININE 1.98*  1.80* 1.86*  CALCIUM 9.1 9.2  MG  --  2.2  PHOS  --  4.4   Liver Function Tests: Recent Labs  Lab 05/10/20 1627 05/10/20 2120  AST 31 35  ALT 22 22  ALKPHOS 75 78  BILITOT 1.2 1.0  PROT 6.2* 6.2*  ALBUMIN 3.6 3.8   No results for input(s): LIPASE, AMYLASE in the last 168 hours. No results for input(s): AMMONIA in the last 168 hours. CBC: Recent Labs  Lab 05/10/20 1627 05/10/20 2120 05/11/20 0252  WBC 5.6 5.7 5.5  NEUTROABS 2.4  --   --  HGB 13.4  13.9 14.2 13.5  HCT 41.1  41.0 43.0 41.2  MCV 100.7* 99.1 99.0  PLT 90* 95* 96*   Cardiac Enzymes: No results for input(s): CKTOTAL, CKMB, CKMBINDEX, TROPONINI in the last 168 hours. BNP: Invalid input(s): POCBNP CBG: Recent Labs  Lab 05/10/20 1623 05/10/20 1656  GLUCAP 101* 90   D-Dimer No results for input(s): DDIMER in the last 72 hours. Hgb A1c Recent Labs    05/11/20 0252  HGBA1C 5.9*   Lipid Profile Recent Labs    05/11/20 0252  CHOL 121  HDL 48  LDLCALC 61  TRIG 62  CHOLHDL 2.5   Thyroid function studies Recent Labs    05/11/20 0252  TSH 1.182   Anemia work up No results for input(s): VITAMINB12, FOLATE, FERRITIN, TIBC, IRON, RETICCTPCT in the last 72 hours. Urinalysis    Component Value Date/Time   COLORURINE STRAW (A) 05/10/2020 2001   APPEARANCEUR CLEAR 05/10/2020 2001   LABSPEC 1.016 05/10/2020 2001   PHURINE 6.0 05/10/2020 2001   GLUCOSEU NEGATIVE 05/10/2020 2001   HGBUR SMALL (A) 05/10/2020 2001   [place] NEGATIVE  05/10/2020 2001   [place] NEGATIVE 05/10/2020 2001   PROTEINUR 30 (A) 05/10/2020 2001   NITRITE NEGATIVE 05/10/2020 2001   LEUKOCYTESUR NEGATIVE 05/10/2020 2001   Sepsis Labs Invalid input(s): PROCALCITONIN,  WBC,  LACTICIDVEN Microbiology Recent Results (from the past 240 hour(s))  SARS Coronavirus 2 by RT PCR (hospital order, performed in [place] hospital lab) Nasopharyngeal Nasopharyngeal Swab     Status: None   Collection Time: 05/10/20  4:56 PM   Specimen: Nasopharyngeal Swab  Result Value Ref Range Status   SARS Coronavirus 2 NEGATIVE NEGATIVE Final    Comment: (NOTE) SARS-CoV-2 target nucleic acids are NOT DETECTED. The SARS-CoV-2 RNA is generally detectable in upper and lower respiratory specimens during the acute phase of infection. The lowest concentration of SARS-CoV-2 viral copies this assay can detect is 250 copies / mL. A negative result does not preclude SARS-CoV-2 infection and should not be used as the sole basis for treatment or other patient management decisions.  A negative result may occur with improper specimen collection / handling, submission of specimen other than nasopharyngeal swab, presence of viral mutation(s) within the areas targeted by this assay, and inadequate number of viral copies (<250 copies / mL). A negative result must be combined with clinical observations, patient history, and epidemiological information. Fact Sheet for Patients:   StrictlyIdeas.no Fact Sheet for Healthcare Providers: BankingDealers.co.za This test is not yet approved or cleared  by the [place] FDA and has been authorized for detection and/or diagnosis of SARS-CoV-2 by FDA under an Emergency Use Authorization (EUA).  This EUA will remain in effect (meaning this test can be used) for the duration of the COVID-19 declaration under Section 564(b)(1) of the Act, 21 U.S.C. section 360bbb-3(b)(1), unless the  authorization is terminated or revoked sooner. Performed at [facility], [place] [address] [place], [place] [address]      Time coordinating discharge: 39 minutes  SIGNED:   Georgette Shell, MD  [facility] 05/11/2020, 4:21 PM Pager   If 7PM-7AM, please contact night-coverage [email or web address] Password [email or web address]

## 2020-05-11 NOTE — Plan of Care (Signed)
  Problem: Education: Goal: Knowledge of disease or condition will improve Outcome: Progressing   Problem: Coping: Goal: Will identify appropriate support needs Outcome: Progressing

## 2020-05-11 NOTE — Care Management CC44 (Signed)
Condition Code 44 Documentation Completed  Patient Details  Name: LIAN TANORI MRN: [id number] Date of Birth: 1935-04-02   Condition Code 44 given:  Yes Patient signature on Condition Code 44 notice:  Yes Documentation of 2 MD's agreement:  Yes Code 44 added to claim:  Yes    Carles Collet, RN 05/11/2020, 4:50 PM

## 2020-05-11 NOTE — Progress Notes (Signed)
Pt discharged home. IV's removed. Pt discharge instructions thoroughly went over and pt verbalizes understanding with no questions. All personal belongings returned. Pt transported to private vehicle via wheelchair.  Janett Billow, RN

## 2020-05-11 NOTE — Progress Notes (Signed)
  Echocardiogram 2D Echocardiogram has been performed.  Darlina Sicilian M 05/11/2020, 10:38 AM

## 2020-05-11 NOTE — Evaluation (Signed)
Physical Therapy Evaluation Patient Details Name: Derek [name] MRN: [id number] DOB: Feb 18, 1935 Today's Date: 05/11/2020   History of Present Illness  Derek [name] is a 85 y.o. male with medical history significant of CVA in 2018, paroxysmal A. fib on Eliquis, hypertension, CAD/MI, COPD, AAA, chronic alcohol use, presented with sudden onset of aphasia and left-sided facial droop.  Patient was having a picnic party in his yard and had about half cup of wine, about 20 to 30 minutes later, patient's girlfriend noticed patient suddenly stopped talking, and seems to have a left-sided facial droop  Clinical Impression  Patient received in bathroom with RN in room. Patient agrees to PT assessment. Requires cues for safety regarding lines during mobility. He transferred independently and initially slightly unsteady with ambulation reaching for counter to steady self. As ambulation distance increased his safety,and cadence improved. He ambulated 140 feet with min guard and no ad. He will continue to benefit from skilled PT while here to improve functional independence and safety with mobility to return home.         Follow Up Recommendations No PT follow up    Equipment Recommendations  None recommended by PT    Recommendations for Other Services       Precautions / Restrictions Precautions Precautions: Fall Restrictions Weight Bearing Restrictions: No      Mobility  Bed Mobility               General bed mobility comments: not tested, patient received in bathroom independently with RN outside in room.  Transfers Overall transfer level: Independent                  Ambulation/Gait Ambulation/Gait assistance: Min guard Gait Distance (Feet): 140 Feet Assistive device: None Gait Pattern/deviations: Step-through pattern;Wide base of support;Decreased stride length Gait velocity: decr   General Gait Details: initially ambulating with guarded cadence, slow, small steps, as  distance increased cadence and steadiness increased  Stairs            Wheelchair Mobility    Modified Rankin (Stroke Patients Only) Modified Rankin (Stroke Patients Only) Pre-Morbid Rankin Score: No symptoms Modified Rankin: Moderately severe disability     Balance Overall balance assessment: Mild deficits observed, not formally tested                                           Pertinent Vitals/Pain Pain Assessment: Faces Faces Pain Scale: Hurts a little bit Pain Location: chronic left leg Pain Descriptors / Indicators: Discomfort Pain Intervention(s): Monitored during session    Home Living Family/patient expects to be discharged to:: Private residence Living Arrangements: Spouse/significant other Available Help at Discharge: Friend(s);Available 24 hours/day Type of Home: Apartment Home Access: Level entry     Home Layout: Two level;Bed/bath upstairs Home Equipment: Cane - single point;Walker - 2 wheels;Grab bars - tub/shower      Prior Function Level of Independence: Independent         Comments: denies any fall in last 6 mo.     Hand Dominance        Extremity/Trunk Assessment   Upper Extremity Assessment Upper Extremity Assessment: Defer to OT evaluation    Lower Extremity Assessment Lower Extremity Assessment: Overall WFL for tasks assessed    Cervical / Trunk Assessment Cervical / Trunk Assessment: Normal  Communication   Communication: No difficulties  Cognition Arousal/Alertness: Awake/alert  Behavior During Therapy: WFL for tasks assessed/performed Overall Cognitive Status: Within Functional Limits for tasks assessed                                 General Comments: wants to go home      General Comments      Exercises     Assessment/Plan    PT Assessment Patient needs continued PT services  PT Problem List Decreased strength;Decreased mobility;Decreased safety awareness;Decreased activity  tolerance;Decreased balance;Decreased knowledge of precautions       PT Treatment Interventions Therapeutic exercise;Gait training;Balance training;Stair training;Functional mobility training;Therapeutic activities;Patient/family education;Neuromuscular re-education    PT Goals (Current goals can be found in the Care Plan section)  Acute Rehab PT Goals Patient Stated Goal: to go home today PT Goal Formulation: With patient Time For Goal Achievement: 05/18/20 Potential to Achieve Goals: Good    Frequency Min 3X/week   Barriers to discharge        Co-evaluation               AM-PAC PT "6 Clicks" Mobility  Outcome Measure Help needed turning from your back to your side while in a flat bed without using bedrails?: None Help needed moving from lying on your back to sitting on the side of a flat bed without using bedrails?: None Help needed moving to and from a bed to a chair (including a wheelchair)?: A Little Help needed standing up from a chair using your arms (e.g., wheelchair or bedside chair)?: A Little Help needed to walk in hospital room?: A Little Help needed climbing 3-5 steps with a railing? : A Little 6 Click Score: 20    End of Session Equipment Utilized During Treatment: Gait belt Activity Tolerance: Patient tolerated treatment well Patient left: in chair;with chair alarm set;with call bell/phone within reach Nurse Communication: Mobility status PT Visit Diagnosis: Unsteadiness on feet (R26.81)    Time: 0102-7253 PT Time Calculation (min) (ACUTE ONLY): 17 min   Charges:   PT Evaluation $PT Eval Moderate Complexity: 1 Mod PT Treatments $Gait Training: 8-22 mins        Johnathon Olden, PT, GCS 05/11/20,9:50 AM

## 2020-05-11 NOTE — Progress Notes (Signed)
[facility] for Heparin Indication: atrial fibrillation  Allergies  Allergen Reactions  . Other Hives, Dermatitis and Other (See Comments)    -mycins  . Sulfa Antibiotics Rash  . Latex Rash  . Tape Rash and Other (See Comments)    Reaction to adhesive tape - pls use paper tape    Patient Measurements: Height: 5\' 9"  (175.3 cm) Weight: 91.8 kg (202 lb 6.4 oz) IBW/kg (Calculated) : 70.7 Heparin Dosing Weight: 89 kg  Vital Signs: Temp: 98.2 F (36.8 C) (05/22 1153) Temp Source: Oral (05/22 1153) BP: 126/68 (05/22 1200) Pulse Rate: 61 (05/22 1200)  Labs: Recent Labs    05/10/20 1627 05/10/20 1627 05/10/20 2120 05/11/20 0252 05/11/20 1231  HGB 13.4  13.9   < > 14.2 13.5  --   HCT 41.1  41.0  --  43.0 41.2  --   PLT 90*  --  95* 96*  --   APTT 32  --   --  78* 126*  LABPROT 13.6  --   --   --   --   INR 1.1  --   --   --   --   HEPARINUNFRC  --   --   --  1.22*  --   CREATININE 1.98*  1.80*  --  1.86*  --   --    < > = values in this interval not displayed.    Estimated Creatinine Clearance: 33.7 mL/min (A) (by C-G formula based on SCr of 1.86 mg/dL (H)).  Assessment: 85 y.o. male on apixaban PTA for Afib (CHADS2VASc = 6). Holding apixaban (LD 5/21 @ 1000) for possible procedure, transition to heparin infusion.   Second aPTT l is 126 and supratherapeutic. HL earlier today still high from apixaban dose. Confirmed with RN and patient that heparin is running via L hand PIV and level was drawn from R hand. No signs of bleeding at this time.   Goal of Therapy:  aPTT 66-102 seconds Monitor platelets by anticoagulation protocol: Yes   Plan:  Decrease heparin to 1150 units/hr F/u 8hr aPTT  F/u aPTT until correlates with heparin level  Monitor daily aPTT, HL, CBC/plt Monitor for signs/symptoms of bleeding   Benetta Spar, PharmD, BCPS, BCCP Clinical Pharmacist  Please check AMION for all [place] phone numbers After 10:00 PM,  call [place]

## 2020-05-11 NOTE — Evaluation (Signed)
Occupational Therapy Evaluation Patient Details Name: Derek [name] MRN: [id number] DOB: [birth year] Today's Date: 05/11/2020    History of Present Illness Derek [name] is a 85 y.o. male with medical history significant of CVA in 2018, paroxysmal A. fib on Eliquis, hypertension, CAD/MI, COPD, AAA, chronic alcohol use, presented with sudden onset of aphasia and left-sided facial droop.  Patient was having a picnic party in his yard and had about half cup of wine, about 20 to 30 minutes later, patient's girlfriend noticed patient suddenly stopped talking, and seems to have a left-sided facial droop   Clinical Impression   This 85 y/o male presents with the above. PTA pt performing ADL and mobility tasks independently. Pt up in bathroom upon arrival with NT assist and spouse present. He currently requires up to [facility] for room level mobility without AD (intermittently pushing iv pole), minA for standing grooming and LB ADL. Pt overall moving well and anticipate good progress with therapies. He will benefit from continued acute OT services and currently recommend follow up [facility] services after discharge to maximize his overall safety and independence with ADL and mobility. Will follow.     Follow Up Recommendations  Home health OT;Supervision/Assistance - 24 hour    Equipment Recommendations  Tub/shower seat           Precautions / Restrictions Precautions Precautions: Fall Restrictions Weight Bearing Restrictions: No      Mobility Bed Mobility               General bed mobility comments: OOB in bathroom upon arrival, pt seated in recliner end of session   Transfers Overall transfer level: Modified independent Equipment used: None                  Balance Overall balance assessment: Needs assistance Sitting-balance support: Feet supported Sitting balance-Leahy Scale: Good     Standing balance support: Single extremity supported;No upper extremity supported;During  functional activity Standing balance-Leahy Scale: Fair                             ADL either performed or assessed with clinical judgement   ADL Overall ADL's : Needs assistance/impaired Eating/Feeding: Modified independent;Sitting   Grooming: Wash/dry hands;Minimal assistance;Standing Grooming Details (indicate cue type and reason): for balance Upper Body Bathing: Set up;Supervision/ safety;Sitting   Lower Body Bathing: Minimal assistance;Sit to/from stand   Upper Body Dressing : Set up;Supervision/safety;Sitting   Lower Body Dressing: Minimal assistance;Sit to/from stand Lower Body Dressing Details (indicate cue type and reason): pt adjusting socks via figure 4 while seated in recliner  Toilet Transfer: Minimal assistance;Ambulation Toilet Transfer Details (indicate cue type and reason): without AD and pushing IV pole; pt seated on toilet in bathroom upon arrival Toileting- Clothing Manipulation and Hygiene: Supervision/safety;Min guard;Sitting/lateral lean;Sit to/from stand Toileting - Clothing Manipulation Details (indicate cue type and reason): pt performing posterior pericare via lateral leans      Functional mobility during ADLs: Minimal assistance                           Pertinent Vitals/Pain Pain Assessment: No/denies pain     Hand Dominance Right   Extremity/Trunk Assessment Upper Extremity Assessment Upper Extremity Assessment: Generalized weakness(gross weakness, symmetrical strength in bil UE)   Lower Extremity Assessment Lower Extremity Assessment: Defer to PT evaluation       Communication Communication Communication: No difficulties   Cognition  Arousal/Alertness: Awake/alert Behavior During Therapy: Flat affect Overall Cognitive Status: Within Functional Limits for tasks assessed                                 General Comments: for basic tasks assessed today, will continue to assess    General Comments  spouse  present during session     Exercises     Shoulder Instructions      Home Living Family/patient expects to be discharged to:: Private residence Living Arrangements: Spouse/significant other Available Help at Discharge: Friend(s);Available 24 hours/day Type of Home: Apartment Home Access: Level entry     Home Layout: Two level;Bed/bath upstairs Alternate Level Stairs-Number of Steps: flight Alternate Level Stairs-Rails: Right Bathroom Shower/Tub: Teacher, early years/pre: Standard     Home Equipment: Cane - single point;Walker - 2 wheels;Grab bars - tub/shower          Prior Functioning/Environment Level of Independence: Independent        Comments: denies any fall in last 6 mo.        OT Problem List: Decreased strength;Decreased range of motion;Decreased activity tolerance;Impaired balance (sitting and/or standing);Decreased knowledge of use of DME or AE      OT Treatment/Interventions: Self-care/ADL training;Therapeutic exercise;Energy conservation;DME and/or AE instruction;Therapeutic activities;Patient/family education;Balance training;Cognitive remediation/compensation    OT Goals(Current goals can be found in the care plan section) Acute Rehab OT Goals Patient Stated Goal: "home today" OT Goal Formulation: With patient Time For Goal Achievement: 05/25/20 Potential to Achieve Goals: Good  OT Frequency: Min 2X/week   Barriers to D/C:            Co-evaluation              AM-PAC OT "6 Clicks" Daily Activity     Outcome Measure Help from another person eating meals?: None Help from another person taking care of personal grooming?: A Little Help from another person toileting, which includes using toliet, bedpan, or urinal?: A Little Help from another person bathing (including washing, rinsing, drying)?: A Little Help from another person to put on and taking off regular upper body clothing?: A Little Help from another person to put on and  taking off regular lower body clothing?: A Little 6 Click Score: 19   End of Session Nurse Communication: Mobility status  Activity Tolerance: Patient tolerated treatment well Patient left: in chair;with call bell/phone within reach;with chair alarm set;with family/visitor present;Other (comment)(lab in room)  OT Visit Diagnosis: Muscle weakness (generalized) (M62.81);Other symptoms and signs involving the nervous system (R29.898)                Time: 2505-3976 OT Time Calculation (min): 18 min Charges:  OT General Charges $OT Visit: 1 Visit OT Evaluation $OT Eval Moderate Complexity: 1 Mod  Lou Cal, OT Acute Rehabilitation Services Pager [phone] Office [phone]   Raymondo Band 05/11/2020, 3:43 PM

## 2020-05-11 NOTE — Progress Notes (Addendum)
STROKE TEAM PROGRESS NOTE   INTERVAL HISTORY No family is at the bedside.  Pt sitting in bed, back to baseline. No facial droop, AAO x 3. Stated that he needs to go home today and he can not stay tonight. He can not sleep last night in this hospital bed and he needs to spend more time with his family not hospital. He remember that he did not had carotid stenting in the past due to his kidney function and he does not want renal failure and dialysis. However, he is ok to continue to follow up with dr. Estanislado Pandy as outpt. He has pacemaker so will need MRI either Monday as inpt or do it as outpt. He would like to do it as outpt. I asked him to monitor BP at home and goal 130-150.   OBJECTIVE Vitals:   05/10/20 2338 05/11/20 0136 05/11/20 0515 05/11/20 0517  BP: (!) 158/81 (!) 145/82 (!) 143/94   Pulse: 63 62 60   Resp: 18 16 18    Temp: (!) 97.5 F (36.4 C) 97.7 F (36.5 C) 97.6 F (36.4 C) 97.8 F (36.6 C)  TempSrc: Oral Oral Oral Axillary  SpO2: 92% 93% 93%   Weight:      Height:        CBC:  Recent Labs  Lab 05/10/20 1627 05/10/20 1627 05/10/20 2120 05/11/20 0252  WBC 5.6   < > 5.7 5.5  NEUTROABS 2.4  --   --   --   HGB 13.4  13.9   < > 14.2 13.5  HCT 41.1  41.0   < > 43.0 41.2  MCV 100.7*   < > 99.1 99.0  PLT 90*   < > 95* 96*   < > = values in this interval not displayed.    Basic Metabolic Panel:  Recent Labs  Lab 05/10/20 1627 05/10/20 2120  NA 141  143 141  K 3.9  3.9 4.7  CL 107  106 104  CO2 22 24  GLUCOSE 112*  99 94  BUN 39*  39* 39*  CREATININE 1.98*  1.80* 1.86*  CALCIUM 9.1 9.2  MG  --  2.2  PHOS  --  4.4    Lipid Panel:     Component Value Date/Time   CHOL 121 05/11/2020 0252   TRIG 62 05/11/2020 0252   HDL 48 05/11/2020 0252   CHOLHDL 2.5 05/11/2020 0252   VLDL 12 05/11/2020 0252   LDLCALC 61 05/11/2020 0252   HgbA1c:  Lab Results  Component Value Date   HGBA1C 5.9 (H) 05/11/2020   Urine Drug Screen: No results found for:  LABOPIA, COCAINSCRNUR, LABBENZ, AMPHETMU, THCU, LABBARB  Alcohol Level     Component Value Date/Time   ETH 53 (H) 05/10/2020 1629    IMAGING  CT Code Stroke CTA Head W/WO contrast CT Code Stroke CTA Neck W/WO contrast 05/10/2020   IMPRESSION:  CTA neck:  1. The bilateral common and internal carotid arteries are patent within the neck. There is high-grade (greater than 70%) stenosis of the proximal right ICA which appears progressed as compared to prior CTA 09/11/2017. Unchanged 50% stenosis within the left ICA bulb.  2. Redemonstrated prominent calcified plaque at the origin of the dominant right vertebral artery. As before, the V1 and proximal V2 right vertebral artery are occluded with reconstitution more distally. Unchanged 50% stenosis at the origin of the non dominant left vertebral artery.   CTA head:  1. No intracranial large vessel occlusion.  2.  Intracranial atherosclerotic disease with multifocal stenoses, most notably as follows.  3. Moderate atherosclerotic narrowing of the cavernous right ICA.  4. Diffuse atherosclerotic irregularity of both posterior cerebral arteries. Severe segmental stenosis within the distal P2 right PCA. Multifocal moderate stenoses within the proximal and distal left PCA.  5. 1-2 mm infundibulum versus tiny aneurysm arising from the supraclinoid left ICA.  DG Chest Portable 1 View 05/10/2020 IMPRESSION:  Stable cardiomegaly. Scattered calcified granulomas. Mild interstitial thickening may represent a degree of underlying fibrotic type change. No edema or consolidation evident. Postoperative changes noted.  Aortic Atherosclerosis (ICD10-I70.0).  CT HEAD CODE STROKE WO CONTRAST 05/10/2020 IMPRESSION:  1. No acute abnormality  2. ASPECTS is 10  3. Moderate atrophy.   Transthoracic Echocardiogram  1. Severe aneurysm of the descening aorta.  2. Left ventricular ejection fraction, by estimation, is 60 to 65%. The  left ventricle has normal  function. The left ventricle has no regional  wall motion abnormalities. There is mild concentric left ventricular  hypertrophy. Left ventricular diastolic  parameters are indeterminate.  3. Right ventricular systolic function is normal. The right ventricular  size is normal. There is normal pulmonary artery systolic pressure.  4. Left atrial size was severely dilated.  5. The mitral valve is normal in structure. No evidence of mitral valve  regurgitation. No evidence of mitral stenosis.  6. The aortic valve is tricuspid. Aortic valve regurgitation is not  visualized. No aortic stenosis is present.  7. Aortic dilatation noted. Aneurysm of the descending aorta, measuring  55 mm. There is mild dilatation of the ascending aorta measuring 37 mm.  8. The inferior vena cava is dilated in size with >50% respiratory  variability, suggesting right atrial pressure of 8 mmHg.   ECG - paced - rate 84 BPM (See cardiology reading for complete details)   PHYSICAL EXAM  Temp:  [97.5 F (36.4 C)-98.2 F (36.8 C)] 98.2 F (36.8 C) (05/22 1153) Pulse Rate:  [59-71] 61 (05/22 1153) Resp:  [13-19] 18 (05/22 1153) BP: (124-171)/(63-100) 126/68 (05/22 1153) SpO2:  [92 %-97 %] 93 % (05/22 1153) Weight:  [91.8 kg-94.8 kg] 91.8 kg (05/21 2153)  General - Well nourished, well developed, in no apparent distress.  Ophthalmologic - fundi not visualized due to noncooperation.  Cardiovascular - Regular rhythm and rate.  Mental Status -  Level of arousal and orientation to time, place, and person were intact. Language including expression, naming, repetition, comprehension was assessed and found intact. Fund of Knowledge was assessed and was intact.  Cranial Nerves II - XII - II - Visual field intact OU. III, IV, VI - Extraocular movements intact. V - Facial sensation intact bilaterally. VII - Facial movement intact bilaterally. VIII - Hearing & vestibular intact bilaterally. X - Palate elevates  symmetrically. XI - Chin turning & shoulder shrug intact bilaterally. XII - Tongue protrusion intact.  Motor Strength - The patient's strength was normal in all extremities and pronator drift was absent.  Bulk was normal and fasciculations were absent.   Motor Tone - Muscle tone was assessed at the neck and appendages and was normal.  Reflexes - The patient's reflexes were symmetrical in all extremities and he had no pathological reflexes.  Sensory - Light touch, temperature/pinprick were assessed and were symmetrical.    Coordination - The patient had normal movements in the hands with no ataxia or dysmetria.  Tremor was absent.  Gait and Station - deferred.    ASSESSMENT/PLAN Mr. MERL BOMMARITO is a 85  y.o. male with history of CVA (2018), HTN, MI, CKD, CAD, OSA (no cpap), right carotid stenosis followed by Dr Estanislado Pandy, COPD, AAA, a. Fib (on eliquis), s/p PPM 2018 and some memory deficits as well as confusion over the past few months presenting with left facial droop, word finding difficulties, and confusion. He did not receive IV t-PA due to anticoagulation with Eliquis.  Likely TIA - will do MRI as outpt  Resultant  Back to baseline  Code Stroke CT Head - No acute abnormality ASPECTS is 10. Moderate atrophy.   MRI head - will do as outpt (pacemaker) as pt wants to go home today  CTA H&N - There is high-grade (greater than 70%) stenosis of the proximal right ICA which appears progressed as compared to prior CTA 09/11/2017. Redemonstrated prominent calcified plaque at the origin of the dominant right vertebral artery. V1 and proximal V2 right vertebral artery are occluded with reconstitution more distally.  2D Echo - EF 60-65%, severe aneurysm of the descening aorta 66mm.  Sars Corona Virus 2 - negative  LDL - 61  HgbA1c - 5.9  UDS neg  VTE prophylaxis - Heparin IV  aspirin 81 mg daily and Eliquis (apixaban) daily prior to admission, now on aspirin 81 mg daily, Brilinta  (ticagrelor) 90 mg bid and heparin IV. However, pt adamant about going home today, will resume his ASA 81 and eliquis on discharge.   Patient counseled to be compliant with his antithrombotic medications  Ongoing aggressive stroke risk factor management  Therapy recommendations: none  Disposition:  Likely home today  Right ICA stenosis  08/2017 CTA head and neck showed right ICA 70% stenosis.  Cerebral angiogram also confirmed right ICA 75% stenosis  This admission CTA head and neck showed right ICA stenosis greater than 70%, seems progressed compared with CTA 08/2017.  Given her left facial droop this time, consider symptomatic right ICA stenosis  Discussed with Dr. Estanislado Pandy, plan for Monday cerebral angiogram with intention to treat  However, patient demand going home today and he is in agreement to follow-up with Dr. Estanislado Pandy as outpatient.  AF  Diagnosed in 08/2017 with bradycardia and stroke  On Eliquis PTA since  Was on heparin IV in preparation for Monday cerebral angiogram with intention to treat right ICA stenosis but patient demanded going home today.  Continue Eliquis on discharge  Hx of stroke  08/2017 admitted for unresponsive, right-sided weakness and slurred speech.  Status post TPA.  CT and MRI negative.  CTA head and neck right ICA 70% stenosis, right VA acutely occluded, BA/left PCA thrombus and right P2 chronic occlusion.  Cerebral angio confirmed right ICA 75% stenosis.  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  Right VA acute occlusion.  EF 55% LDL 46, A1c 5.4.  Patient was found to have new onset A. fib with bradycardia.  Put on aspirin 81 and Eliquis as well as Crestor 20 and add Zetia on discharge.  09/2017 admitted for left-sided numbness.  MRI showed right thalamic infarct and left occipital tiny infarct.  Continued on Eliquis.  09/2017 pacemaker due to A. fib with bradycardia.  Follows with Dr. Leonie Man at [facility]  CKD  Creatinine 1.8 -1.98 -1.86  Has been  follow-up with nephrology  Has delayed endovascular treatment in the past due to CKD  Patient does not want to risk for renal failure and possible dialysis with iodine contrast  Continue follow-up with nephrology as outpatient  Hypertension  Home BP meds: Hytrin  Current BP meds: Hytrin  Stable . Permissive hypertension (OK if < 220/120) but gradually normalize in 5-7 days  . Long-term BP goal 130-150 given severe carotid stenosis  Hyperlipidemia  Home Lipid lowering medication: Lipitor 40 mg daily and Zetia 10 mg daily  LDL 61, goal < 70  Current lipid lowering medication: Lipitor 40 mg daily and Zetia 10 mg daily  Continue statin at discharge  Other Stroke Risk Factors  Advanced age  Former cigarette smoker - quit  ETOH use, advised to drink no more than 1 alcoholic beverage per day. (ETOH level 53)  Obesity, Body mass index is 29.89 kg/m., recommend weight loss, diet and exercise as appropriate   Coronary artery disease  Obstructive sleep apnea, not on CPAP at home  Other Active Problems  Code status - DNR  Thrombocytopenia - 90->95->96 - could be related to alcohol use   Hospital day # 1  Neurology will sign off. Please call with questions. Pt will follow up with stroke clinic Dr. Leonie Man at [facility] in about 4 weeks. Thanks for the consult.   Rosalin Hawking, MD PhD Stroke Neurology 05/11/2020 3:54 PM    To contact Stroke Continuity provider, please refer to [email or web address] After hours, contact General Neurology

## 2020-05-11 NOTE — Progress Notes (Signed)
[place] for Heparin Indication: atrial fibrillation  Allergies  Allergen Reactions  . Other Hives, Dermatitis and Other (See Comments)    -mycins  . Sulfa Antibiotics Rash  . Latex Rash  . Tape Rash and Other (See Comments)    Reaction to adhesive tape - pls use paper tape    Patient Measurements: Height: 5\' 9"  (175.3 cm) Weight: 91.8 kg (202 lb 6.4 oz) IBW/kg (Calculated) : 70.7 Heparin Dosing Weight: 86.3 kg  Vital Signs: Temp: 97.7 F (36.5 C) (05/22 0136) Temp Source: Oral (05/22 0136) BP: 145/82 (05/22 0136) Pulse Rate: 62 (05/22 0136)  Labs: Recent Labs    05/10/20 1627 05/10/20 1627 05/10/20 2120 05/11/20 0252  HGB 13.4  13.9   < > 14.2 13.5  HCT 41.1  41.0  --  43.0 41.2  PLT 90*  --  95* 96*  APTT 32  --   --  78*  LABPROT 13.6  --   --   --   INR 1.1  --   --   --   CREATININE 1.98*  1.80*  --  1.86*  --    < > = values in this interval not displayed.    Estimated Creatinine Clearance: 33.7 mL/min (A) (by C-G formula based on SCr of 1.86 mg/dL (H)).  Assessment: 85 y.o. male with h/o Afib, Eliquis on hold for heparin  Goal of Therapy:  aPTT 66-102 seconds Monitor platelets by anticoagulation protocol: Yes   Plan:  Continue Heparin at current rate   Meta Kroenke, [place]urb PharmD. BCPS  05/11/2020,5:08 AM

## 2020-05-11 NOTE — Care Management Obs Status (Signed)
[facility] NOTIFICATION   Patient Details  Name: GAYLAN FAUVER MRN: [id number] Date of Birth: August 11, 1935   Medicare Observation Status Notification Given:  Yes    Carles Collet, RN 05/11/2020, 4:50 PM

## 2020-05-13 ENCOUNTER — Telehealth: Payer: Self-pay | Admitting: Neurology

## 2020-05-13 DIAGNOSIS — G459 Transient cerebral ischemic attack, unspecified: Secondary | ICD-10-CM

## 2020-05-13 NOTE — Telephone Encounter (Signed)
Since the patient has a pacemaker. I will need a new MRI order put in for [facility]. Thank you!!!.

## 2020-05-13 NOTE — Telephone Encounter (Signed)
Noted, faxed pacemaker info to [facility]. If it is MRI safe they will reach out to the patient to schedule. Medicare/tricare no auth.

## 2020-05-13 NOTE — Telephone Encounter (Signed)
I ordered a new MRI order for [facility]. Thanks.   Rosalin Hawking, MD PhD Stroke Neurology 05/13/2020 8:51 AM

## 2020-05-13 NOTE — Telephone Encounter (Signed)
Error

## 2020-05-14 ENCOUNTER — Other Ambulatory Visit (HOSPITAL_COMMUNITY): Payer: Self-pay | Admitting: Interventional Radiology

## 2020-05-14 ENCOUNTER — Telehealth (HOSPITAL_COMMUNITY): Payer: Self-pay

## 2020-05-14 DIAGNOSIS — G459 Transient cerebral ischemic attack, unspecified: Secondary | ICD-10-CM

## 2020-05-14 NOTE — Telephone Encounter (Signed)
Patient is scheduled at [facility] for 05/31/20.

## 2020-05-14 NOTE — Telephone Encounter (Signed)
-----   Message from Ronney Lion, [place] sent at 05/14/2020  1:57 PM EDT ----- Regarding: RE: [place],  I spoke with Dr. Estanislado Pandy about this. He would like to schedule patient for consult next week. Please schedule and please let me know if you have any questions.  Thanks! Ally ----- Message ----- From: Danielle Dess Sent: 05/14/2020   1:47 PM EDT To: Ronney Lion, PA-C Subject: Hospital Follow-up                             Ally,  Pt was in the hospital this past weekend for TIA. He is a known pt of Dr. Estanislado Pandy. His discharge summary says for him to get in to see D within one week of discharge. Do you want me to go ahead and schedule his consult for this week or next week? His daughter is in town and she will be coming with him so that she is up to date on what is going on.   Thanks, Lia Foyer

## 2020-05-14 NOTE — Telephone Encounter (Signed)
Called daughter to schedule consult, no answer, left vm. AW

## 2020-05-15 ENCOUNTER — Ambulatory Visit: Payer: Medicare Other | Admitting: Adult Health

## 2020-05-22 DIAGNOSIS — N184 Chronic kidney disease, stage 4 (severe): Secondary | ICD-10-CM | POA: Diagnosis not present

## 2020-05-24 ENCOUNTER — Ambulatory Visit (HOSPITAL_COMMUNITY)
Admission: RE | Admit: 2020-05-24 | Discharge: 2020-05-24 | Disposition: A | Discharge status: Home / Self Care | Payer: Medicare Other | Source: Ambulatory Visit | Attending: Interventional Radiology | Admitting: Interventional Radiology

## 2020-05-24 ENCOUNTER — Other Ambulatory Visit: Payer: Self-pay

## 2020-05-24 DIAGNOSIS — G459 Transient cerebral ischemic attack, unspecified: Secondary | ICD-10-CM

## 2020-05-24 NOTE — Progress Notes (Signed)
Chief Complaint: Patient was seen in consultation today for proximal right ICA stenosis.  Referring Physician(s): Amie Portland  Supervising Physician: Luanne Bras  Patient Status: [facility] - Out-pt  History of Present Illness: Derek [name] is a 85 y.o. male with a past medical history as below, with pertinent past medical history including hypertension, CAD, MI, s/p pacemaker, AAA, TIA/CVA, COPD, CKD, hypothyroidism, OSA, and prior tobacco use. He is known to [facility] and has been followed by Dr. Estanislado Pandy since 08/2017. He first presented to our department at the request of Dr. Rory Percy for management of cranial stenosis. He underwent an image-guided diagnostic cerebral arteriogram 09/12/2017 by Dr. Estanislado Pandy (see below results). Since patient remained grossly asymptomatic, he has since been followed by routine imaging scans to monitor for changes. He was recently admitted to [facility] 05/10/2020 to 05/11/2020 for management of TIA. During this admission, CTA head/neck revealed high-grade stenosis of proximal right ICA, progressed compared to prior CTA in 2018. Neurology was consulted who recommended patient be re-evaluated by Dr. Estanislado Pandy on outpatient basis once discharged.  Diagnostic cerebral arteriogram 09/12/2017: 1. Angiographically occluded right vertebral artery just distal to its origin, with retrograde opacification of the right dominant vertebral artery retrogradely from external carotid artery branches from the ipsilateral occipital artery, and branches of the ascending cervical branch of the thyrocervical trunk. 2. High-grade stenosis of the right vertebrobasilar junction with slow antegrade flow into the distal basilar artery. 3. Poor filling distal to the right posterior cerebral artery P2 P3 segment, with arteriosclerotic changes involving the left posterior cerebral artery. 4. Patency of the right posterior communicating artery partially supplying the right posterior cerebral artery. 5.  Approximately 75% stenosis of the right internal carotid artery proximally by the NASCET criteria. 6. Approximately 30% stenosis of the left internal carotid artery proximally associated with a small ulceration. 7. Approximately 50% stenosis of the right internal carotid artery supraclinoid segment. 8. Occlusion versus high-grade stenosis of the right anterior cerebral artery at the A1 A2 junction. 9. 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  CTA head/neck 05/10/2020: 1. No intracranial large vessel occlusion. 2. Intracranial atherosclerotic disease with multifocal stenoses, most notably as follows. 3. Moderate atherosclerotic narrowing of the cavernous right ICA. 4. Diffuse atherosclerotic irregularity of both posterior cerebral arteries. Severe segmental stenosis within the distal P2 right PCA. Multifocal moderate stenoses within the proximal and distal left PCA. 5. 1-2 mm infundibulum versus tiny aneurysm arising from the supraclinoid left ICA. 6. The bilateral common and internal carotid arteries are patent within the neck. There is high-grade (greater than 70%) stenosis of the proximal right ICA which appears progressed as compared to prior CTA 09/11/2017. Unchanged 50% stenosis within the left ICA bulb. 7. Redemonstrated prominent calcified plaque at the origin of the dominant right vertebral artery. As before, the V1 and proximal V2 right vertebral artery are occluded with reconstitution more distally. Unchanged 50% stenosis at the origin of the non dominant left vertebral artery.  Patient presents today for follow-up to discuss management options of proximal right ICA stenosis. Patient awake and alert sitting in wheelchair. Accompanied by daughter. Complains of left-sided "heaviness"/weakness x 1 week. Denies headache, numbness/tingling, dizziness, vision  changes, hearing changes, tinnitus, or speech difficulty.  Currently taking Eliquis 2.5 mg twice daily and Aspirin 81 mg once daily.    Past Medical History:  Diagnosis Date  .  AAA (abdominal aortic aneurysm) ([place])   . Arthritis   . Chronic kidney disease   . COPD (chronic obstructive pulmonary disease) ([place])   . Coronary artery disease   . Headache   . Hypertension   . Hypothyroidism   . Myocardial infarction ([place])   . Presence of permanent cardiac pacemaker   . Shortness of breath    on exertion;  has productive cough at times  . Sleep apnea    states mild . no cpap  . Stroke [facility])     Past Surgical History:  Procedure Laterality Date  . APPENDECTOMY  1963  . CATARACT EXTRACTION     w/ lid rise  . CORONARY ARTERY BYPASS GRAFT  2007  . EYE SURGERY Right March 2016   Cataract  . EYE SURGERY Bilateral May 2016   Eyebrow Lift  . EYE SURGERY Left June 2016   Cataract  . IR ANGIO INTRA EXTRACRAN SEL COM CAROTID INNOMINATE BILAT MOD SED  09/12/2017  . IR ANGIO VERTEBRAL SEL SUBCLAVIAN INNOMINATE UNI R MOD SED  09/12/2017  . IR RADIOLOGIST EVAL & MGMT  12/07/2017  . KNEE ARTHROSCOPY  02/26/2012   Procedure: ARTHROSCOPY KNEE;  Surgeon: Sydnee Cabal, MD;  Location: [facility];  Service: Orthopedics;  Laterality: Left;  medial menisectomy and chondraplasty  . PACEMAKER IMPLANT N/A 10/04/2017   Procedure: PACEMAKER IMPLANT;  Surgeon: Deboraha Sprang, MD;  Location: [facility];  Service: Cardiovascular;  Laterality: N/A;    Allergies: Other, Sulfa antibiotics, Latex, and Tape  Medications: Prior to Admission medications   Medication Sig Start Date End Date Taking? Authorizing Provider  acetaminophen (TYLENOL) 500 MG tablet Take 500 mg by mouth every 6 (six) hours as needed for moderate pain or headache.    [provider]  allopurinol (ZYLOPRIM) 100 MG tablet Take 1 tablet by mouth 2 (two) times a day. 03/23/19   [provider]   aspirin EC 81 MG tablet Take 81 mg by mouth daily after supper.    [provider]  atorvastatin (LIPITOR) 40 MG tablet Take 40 mg by mouth daily. 05/22/18   [provider]  Biotin 5000 MCG TABS Take 5,000 mcg by mouth daily.    [provider]  budesonide-formoterol (SYMBICORT) 80-4.5 MCG/ACT inhaler Take 2 puffs first thing in am and then another 2 puffs about 12 hours later. Patient taking differently: Inhale 2 puffs into the lungs daily as needed (for wheezing or shortness of breath).  11/21/19   Tanda Rockers, MD  ELIQUIS 2.5 MG TABS tablet TAKE 1 TABLET TWICE A DAY Patient taking differently: Take 2.5 mg by mouth 2 (two) times daily.  04/03/19   Belva Crome, MD  ezetimibe (ZETIA) 10 MG tablet Take 10 mg by mouth daily after supper.     [provider]  famotidine (PEPCID) 20 MG tablet One at bedtime Patient taking differently: Take 20 mg by mouth at bedtime. One at bedtime 08/01/18   Tanda Rockers, MD  fluticasone [facility]) 50 MCG/ACT nasal spray Place 1 spray into both nostrils daily as needed for allergies or rhinitis.    [provider]  fluticasone-salmeterol (ADVAIR HFA) 115-21 MCG/ACT inhaler Inhale 2 puffs into the lungs 2 (two) times daily. 12/04/19   Tanda Rockers, MD  furosemide (LASIX) 20 MG tablet TAKE 1 TABLET DAILY AS NEEDED FOR SWELLING Patient taking differently: Take 20 mg by mouth daily as needed for fluid.  01/15/20   Wert,  Christena Deem, MD  levothyroxine (SYNTHROID, LEVOTHROID) 88 MCG tablet Take 88 mcg by mouth daily before breakfast.    [provider]  Magnesium 250 MG TABS Take 250 mg by mouth daily.    [provider]  Multiple Vitamin (MULTIVITAMIN WITH MINERALS) TABS tablet Take 1 tablet by mouth daily.    [provider]  Omega-3 Fatty Acids (FISH OIL) 1000 MG CAPS Take 1,000 mg by mouth daily.     [provider]  OXYGEN Inhale 2 L into the lungs as needed (for  shortness of breath  with exertion only).     [provider]  pregabalin (LYRICA) 50 MG capsule Take 50 mg by mouth daily.     [provider]  terazosin (HYTRIN) 5 MG capsule Take 10 mg by mouth 2 (two) times daily. 08/30/17   [provider]  Turmeric 500 MG CAPS Take 500 mg by mouth daily.    [provider]  vitamin B-12 (CYANOCOBALAMIN) 1000 MCG tablet Take 1,000 mcg by mouth daily.    [provider]     Family History  Problem Relation Age of Onset  . Diabetes Mother   . Heart disease Mother        Before age 62  . Diabetes Sister   . Hypertension Sister   . Diabetes Brother   . Hypertension Brother   . Hyperlipidemia Brother   . Hypertension Son   . Heart attack Daughter     Social History   Socioeconomic History  . Marital status: Divorced    Spouse name: Not on file  . Number of children: Not on file  . Years of education: 12+  . Highest education level: Not on file  Occupational History  . Occupation: Retired   Tobacco Use  . Smoking status: Former Smoker    Packs/day: 0.75    Years: 51.00    Pack years: 38.25    Types: Cigarettes    Quit date: 02/23/2000    Years since quitting: 20.2  . Smokeless tobacco: Never Used  Substance and Sexual Activity  . Alcohol use: Yes    Alcohol/week: 0.0 - 1.0 standard drinks    Comment: Moderate to little  . Drug use: No  . Sexual activity: Not on file  Other Topics Concern  . Not on file  Social History Narrative   Patient lives at home alone    Patient is retired    Patient has 3 years of college    Social Determinants of Radio broadcast assistant Strain:   . Difficulty of Paying Living Expenses:   Food Insecurity:   . Worried About Charity fundraiser in the Last Year:   . Arboriculturist in the Last Year:   Transportation Needs:   . Film/video editor (Medical):   Marland Kitchen Lack of Transportation (Non-Medical):   Physical Activity:   . Days of Exercise per Week:   . Minutes of  Exercise per Session:   Stress:   . Feeling of Stress :   Social Connections:   . Frequency of Communication with Friends and Family:   . Frequency of Social Gatherings with Friends and Family:   . Attends Religious Services:   . Active Member of Clubs or Organizations:   . Attends Archivist Meetings:   Marland Kitchen Marital Status:      Review of Systems: A 12 point ROS discussed and pertinent positives are indicated in the HPI above.  All other systems are negative.  Review of Systems  Constitutional: Negative for chills and fever.  HENT: Negative for hearing loss and tinnitus.   Respiratory: Negative for shortness of breath and wheezing.   Cardiovascular: Negative for chest pain and palpitations.  Gastrointestinal: Negative for abdominal pain.  Neurological: Positive for weakness. Negative for dizziness, speech difficulty, numbness and headaches.  Psychiatric/Behavioral: Negative for behavioral problems and confusion.    Vital Signs: There were no vitals taken for this visit.  Physical Exam Constitutional:      General: He is not in acute distress.    Appearance: Normal appearance.  Pulmonary:     Effort: Pulmonary effort is normal. No respiratory distress.  Skin:    General: Skin is warm and dry.  Neurological:     Mental Status: He is alert and oriented to person, place, and time.      Imaging: CT Code Stroke CTA Head W/WO contrast  Addendum Date: 05/10/2020   ADDENDUM REPORT: 05/10/2020 17:40 ADDENDUM: These results were called by telephone at the time of interpretation on 05/10/2020 at 5:40 pm to provider Dr. Ronnald Nian, who verbally acknowledged these results. Electronically Signed   By: Kellie Simmering DO   On: 05/10/2020 17:40   Result Date: 05/10/2020 CLINICAL DATA:  Neuro deficit, acute, stroke suspected. Additional provided: Left-sided facial droop, last known well 15:45 EXAM: CT ANGIOGRAPHY HEAD AND NECK TECHNIQUE: Multidetector CT imaging of the head and neck  was performed using the standard protocol during bolus administration of intravenous contrast. Multiplanar CT image reconstructions and MIPs were obtained to evaluate the vascular anatomy. Carotid stenosis measurements (when applicable) are obtained utilizing NASCET criteria, using the distal internal carotid diameter as the denominator. CONTRAST:  68mL OMNIPAQUE IOHEXOL 350 MG/ML SOLN COMPARISON:  Noncontrast head CT performed earlier the same day 05/10/2020, brain MRI 10/03/2017, CT angiogram head/neck 09/11/2017 FINDINGS: CTA NECK FINDINGS Aortic arch: Standard aortic branching. Atherosclerotic plaque within the visualized aortic arch and proximal major branch vessels of the neck. No hemodynamically significant innominate or proximal subclavian artery stenosis. Right carotid system: The CCA is patent to the bifurcation without significant stenosis. Scattered calcified plaque within this vessel. There is prominent calcified plaque within the carotid bifurcation and proximal ICA. There is high-grade stenosis of the proximal ICA, exceeding 70%, which appears progressed as compared to prior CTA 09/11/2017. Distal to this, the ICA is patent within the neck without significant stenosis. Left carotid system: The CCA is patent to the bifurcation without significant stenosis. Scattered calcified plaque within this vessel. Moderate calcified plaque within the carotid bifurcation and proximal ICA. Unchanged 50% stenosis within the left ICA bulb. Vertebral arteries: The right vertebral artery is dominant. Redemonstrated prominent calcified plaque at the origin of the right vertebral artery. As before, the V1 and proximal V2 right vertebral artery are occluded. There is reconstitution of flow more distally within the right vertebral artery. Redemonstrated moderate stenosis at the origin of the non dominant left vertebral artery. More distally, the left vertebral artery is patent within the neck without significant stenosis.  Skeleton: No acute bony abnormality or aggressive osseous lesion. Cervical spondylosis without high-grade bony spinal canal narrowing. Other neck: No neck mass or cervical lymphadenopathy. Upper chest: Emphysema within the imaged lung apices. Prior median sternotomy. Review of the MIP images confirms the above findings CTA HEAD FINDINGS Anterior circulation: The intracranial internal carotid arteries are patent. Calcified plaque within both vessels. Unchanged sites of moderate atherosclerotic narrowing within the cavernous right ICA. No more than  mild stenosis of the intracranial left ICA. The M1 middle cerebral arteries are patent. Mild atherosclerotic narrowing within the mid M1 left MCA (series 10, image 20). No M2 proximal branch occlusion or high-grade proximal stenosis is identified. Hypoplastic A1 right ACA. The anterior cerebral arteries are patent without significant proximal stenosis. 1-2 mm inferiorly projecting vascular protrusion arising from the supraclinoid left ICA which may reflect an infundibulum or tiny aneurysm (series 9, image 120). Posterior circulation: The dominant intracranial right vertebral arteries patent with scattered calcified plaque and sites of mild stenosis. The non dominant intracranial left vertebral artery is developmentally diminutive, but patent and terminates as the left PICA. The basilar artery is patent with multifocal calcified plaque and sites of mild stenosis. The posterior cerebral arteries are patent bilaterally. There is diffuse atherosclerotic irregularity of both vessels. Severe segmental stenosis within the distal P2 right PCA. Multifocal moderate stenoses within the proximal and distal left PCA. Posterior cerebral arteries are hypoplastic or absent bilaterally. Venous sinuses: Within limitations of contrast timing, no convincing thrombus. Anatomic variants: As described Review of the MIP images confirms the above findings IMPRESSION: CTA neck: 1. The bilateral  common and internal carotid arteries are patent within the neck. There is high-grade (greater than 70%) stenosis of the proximal right ICA which appears progressed as compared to prior CTA 09/11/2017. Unchanged 50% stenosis within the left ICA bulb. 2. Redemonstrated prominent calcified plaque at the origin of the dominant right vertebral artery. As before, the V1 and proximal V2 right vertebral artery are occluded with reconstitution more distally. Unchanged 50% stenosis at the origin of the non dominant left vertebral artery. CTA head: 1. No intracranial large vessel occlusion. 2. Intracranial atherosclerotic disease with multifocal stenoses, most notably as follows. 3. Moderate atherosclerotic narrowing of the cavernous right ICA. 4. Diffuse atherosclerotic irregularity of both posterior cerebral arteries. Severe segmental stenosis within the distal P2 right PCA. Multifocal moderate stenoses within the proximal and distal left PCA. 5. 1-2 mm infundibulum versus tiny aneurysm arising from the supraclinoid left ICA. Electronically Signed: By: Kellie Simmering DO On: 05/10/2020 17:26   CT Code Stroke CTA Neck W/WO contrast  Addendum Date: 05/10/2020   ADDENDUM REPORT: 05/10/2020 17:40 ADDENDUM: These results were called by telephone at the time of interpretation on 05/10/2020 at 5:40 pm to provider Dr. Ronnald Nian, who verbally acknowledged these results. Electronically Signed   By: Kellie Simmering DO   On: 05/10/2020 17:40   Result Date: 05/10/2020 CLINICAL DATA:  Neuro deficit, acute, stroke suspected. Additional provided: Left-sided facial droop, last known well 15:45 EXAM: CT ANGIOGRAPHY HEAD AND NECK TECHNIQUE: Multidetector CT imaging of the head and neck was performed using the standard protocol during bolus administration of intravenous contrast. Multiplanar CT image reconstructions and MIPs were obtained to evaluate the vascular anatomy. Carotid stenosis measurements (when applicable) are obtained utilizing  NASCET criteria, using the distal internal carotid diameter as the denominator. CONTRAST:  3mL OMNIPAQUE IOHEXOL 350 MG/ML SOLN COMPARISON:  Noncontrast head CT performed earlier the same day 05/10/2020, brain MRI 10/03/2017, CT angiogram head/neck 09/11/2017 FINDINGS: CTA NECK FINDINGS Aortic arch: Standard aortic branching. Atherosclerotic plaque within the visualized aortic arch and proximal major branch vessels of the neck. No hemodynamically significant innominate or proximal subclavian artery stenosis. Right carotid system: The CCA is patent to the bifurcation without significant stenosis. Scattered calcified plaque within this vessel. There is prominent calcified plaque within the carotid bifurcation and proximal ICA. There is high-grade stenosis of the proximal ICA, exceeding 70%,  which appears progressed as compared to prior CTA 09/11/2017. Distal to this, the ICA is patent within the neck without significant stenosis. Left carotid system: The CCA is patent to the bifurcation without significant stenosis. Scattered calcified plaque within this vessel. Moderate calcified plaque within the carotid bifurcation and proximal ICA. Unchanged 50% stenosis within the left ICA bulb. Vertebral arteries: The right vertebral artery is dominant. Redemonstrated prominent calcified plaque at the origin of the right vertebral artery. As before, the V1 and proximal V2 right vertebral artery are occluded. There is reconstitution of flow more distally within the right vertebral artery. Redemonstrated moderate stenosis at the origin of the non dominant left vertebral artery. More distally, the left vertebral artery is patent within the neck without significant stenosis. Skeleton: No acute bony abnormality or aggressive osseous lesion. Cervical spondylosis without high-grade bony spinal canal narrowing. Other neck: No neck mass or cervical lymphadenopathy. Upper chest: Emphysema within the imaged lung apices. Prior median  sternotomy. Review of the MIP images confirms the above findings CTA HEAD FINDINGS Anterior circulation: The intracranial internal carotid arteries are patent. Calcified plaque within both vessels. Unchanged sites of moderate atherosclerotic narrowing within the cavernous right ICA. No more than mild stenosis of the intracranial left ICA. The M1 middle cerebral arteries are patent. Mild atherosclerotic narrowing within the mid M1 left MCA (series 10, image 20). No M2 proximal branch occlusion or high-grade proximal stenosis is identified. Hypoplastic A1 right ACA. The anterior cerebral arteries are patent without significant proximal stenosis. 1-2 mm inferiorly projecting vascular protrusion arising from the supraclinoid left ICA which may reflect an infundibulum or tiny aneurysm (series 9, image 120). Posterior circulation: The dominant intracranial right vertebral arteries patent with scattered calcified plaque and sites of mild stenosis. The non dominant intracranial left vertebral artery is developmentally diminutive, but patent and terminates as the left PICA. The basilar artery is patent with multifocal calcified plaque and sites of mild stenosis. The posterior cerebral arteries are patent bilaterally. There is diffuse atherosclerotic irregularity of both vessels. Severe segmental stenosis within the distal P2 right PCA. Multifocal moderate stenoses within the proximal and distal left PCA. Posterior cerebral arteries are hypoplastic or absent bilaterally. Venous sinuses: Within limitations of contrast timing, no convincing thrombus. Anatomic variants: As described Review of the MIP images confirms the above findings IMPRESSION: CTA neck: 1. The bilateral common and internal carotid arteries are patent within the neck. There is high-grade (greater than 70%) stenosis of the proximal right ICA which appears progressed as compared to prior CTA 09/11/2017. Unchanged 50% stenosis within the left ICA bulb. 2.  Redemonstrated prominent calcified plaque at the origin of the dominant right vertebral artery. As before, the V1 and proximal V2 right vertebral artery are occluded with reconstitution more distally. Unchanged 50% stenosis at the origin of the non dominant left vertebral artery. CTA head: 1. No intracranial large vessel occlusion. 2. Intracranial atherosclerotic disease with multifocal stenoses, most notably as follows. 3. Moderate atherosclerotic narrowing of the cavernous right ICA. 4. Diffuse atherosclerotic irregularity of both posterior cerebral arteries. Severe segmental stenosis within the distal P2 right PCA. Multifocal moderate stenoses within the proximal and distal left PCA. 5. 1-2 mm infundibulum versus tiny aneurysm arising from the supraclinoid left ICA. Electronically Signed: By: Kellie Simmering DO On: 05/10/2020 17:26   DG Chest Portable 1 View  Result Date: 05/10/2020 CLINICAL DATA:  Altered mental status EXAM: PORTABLE CHEST 1 VIEW COMPARISON:  November 28, 2019 FINDINGS: There is cardiomegaly with pulmonary vascularity  normal. There is mild interstitial thickening about edema or consolidation. There are scattered calcified granulomas. Pacemaker leads are attached to the right atrium and right ventricle. Patient is status post coronary artery bypass grafting. There is aortic atherosclerosis. No adenopathy. No bone lesions. IMPRESSION: Stable cardiomegaly. Scattered calcified granulomas. Mild interstitial thickening may represent a degree of underlying fibrotic type change. No edema or consolidation evident. Postoperative changes noted. Aortic Atherosclerosis (ICD10-I70.0). Electronically Signed   By: Lowella Grip III M.D.   On: 05/10/2020 16:56   ECHOCARDIOGRAM COMPLETE  Result Date: 05/11/2020    ECHOCARDIOGRAM REPORT   Patient Name:   Derek [name] Date of Exam: 05/11/2020 Medical Rec #:  [id number]      Height:       69.0 in Accession #:    [id number]     Weight:       202.4 lb Date of  Birth:  [birth year]       BSA:          2.077 m Patient Age:    36 years       BP:           132/63 mmHg Patient Gender: M              HR:           60 bpm. Exam Location:  Inpatient Procedure: 2D Echo Indications:    TIA 435.9 / G45.9  History:        Patient has prior history of Echocardiogram examinations, most                 recent 09/13/2017. CAD and Previous Myocardial Infarction, COPD                 and Stroke, Arrythmias:Atrial Fibrillation; Risk Factors:Former                 Smoker and Sleep Apnea. AAA.  Sonographer:    Darlina Sicilian RDCS Referring Phys: [id number] [place]  1. Severe aneurysm of the descening aorta.  2. Left ventricular ejection fraction, by estimation, is 60 to 65%. The left ventricle has normal function. The left ventricle has no regional wall motion abnormalities. There is mild concentric left ventricular hypertrophy. Left ventricular diastolic parameters are indeterminate.  3. Right ventricular systolic function is normal. The right ventricular size is normal. There is normal pulmonary artery systolic pressure.  4. Left atrial size was severely dilated.  5. The mitral valve is normal in structure. No evidence of mitral valve regurgitation. No evidence of mitral stenosis.  6. The aortic valve is tricuspid. Aortic valve regurgitation is not visualized. No aortic stenosis is present.  7. Aortic dilatation noted. 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 There is mild dilatation of the ascending aorta measuring 37 mm.  8. The inferior vena cava is dilated in size with >50% respiratory variability, suggesting right atrial pressure of 8 mmHg. FINDINGS  Left Ventricle: Left ventricular ejection fraction, by estimation, is 60 to 65%. The left ventricle has normal function. The left ventricle has no regional wall motion abnormalities. The left ventricular internal cavity size was normal in size. There is  mild concentric left ventricular hypertrophy. Left ventricular  diastolic parameters are indeterminate. Indeterminate filling pressures. Right Ventricle: The right ventricular size is normal. No increase in right ventricular wall thickness. Right ventricular systolic function is normal. There is normal pulmonary artery systolic pressure. The tricuspid regurgitant velocity is 2.14 m/s, and  with an  assumed right atrial pressure of 8 mmHg, the estimated right ventricular systolic pressure is 35.3 mmHg. Left Atrium: Left atrial size was severely dilated. Right Atrium: Right atrial size was normal in size. Pericardium: There is no evidence of pericardial effusion. Mitral Valve: The mitral valve is normal in structure. Normal mobility of the mitral valve leaflets. No evidence of mitral valve regurgitation. No evidence of mitral valve stenosis. Tricuspid Valve: The tricuspid valve is normal in structure. Tricuspid valve regurgitation is trivial. No evidence of tricuspid stenosis. Aortic Valve: The aortic valve is tricuspid. Aortic valve regurgitation is not visualized. No aortic stenosis is present. Pulmonic Valve: The pulmonic valve was normal in structure. Pulmonic valve regurgitation is mild to moderate. No evidence of pulmonic stenosis. Aorta: Aortic dilatation noted. There is mild dilatation of the ascending aorta measuring 37 mm. There is an aneurysm involving the descending aorta. The aneurysm measures 55 mm. Venous: The inferior vena cava is dilated in size with greater than 50% respiratory variability, suggesting right atrial pressure of 8 mmHg. IAS/Shunts: No atrial level shunt detected by color flow Doppler.  LEFT VENTRICLE PLAX 2D LVIDd:         4.50 cm LVIDs:         2.90 cm LV PW:         1.30 cm LV IVS:        1.30 cm LVOT diam:     2.30 cm LV SV:         66 LV SV Index:   32 LVOT Area:     4.15 cm  LEFT ATRIUM             Index LA diam:        5.50 cm 2.65 cm/m LA Vol (A2C):   63.9 ml 30.77 ml/m LA Vol (A4C):   87.1 ml 41.95 ml/m LA Biplane Vol: 76.5 ml 36.84 ml/m   AORTIC VALVE LVOT Vmax:   99.20 cm/s LVOT Vmean:  63.700 cm/s LVOT VTI:    0.159 m  AORTA Ao Root diam: 3.80 cm MITRAL VALVE               TRICUSPID VALVE MV Area (PHT): 2.95 cm    TR Peak grad:   18.3 mmHg MV Decel Time: 257 msec    TR Vmax:        214.00 cm/s MV E velocity: 78.00 cm/s                            SHUNTS                            Systemic VTI:  0.16 m                            Systemic Diam: 2.30 cm Skeet Latch MD Electronically signed by Skeet Latch MD Signature Date/Time: 05/11/2020/12:25:21 PM    Final    CT HEAD CODE STROKE WO CONTRAST  Result Date: 05/10/2020 CLINICAL DATA:  Code stroke.  Left facial droop. EXAM: CT HEAD WITHOUT CONTRAST TECHNIQUE: Contiguous axial images were obtained from the base of the skull through the vertex without intravenous contrast. COMPARISON:  CT head 05/09/2018 FINDINGS: Brain: Generalized atrophy. Negative for acute infarct, hemorrhage, mass. No midline shift. Vascular: Negative for hyperdense vessel. Atherosclerotic calcification in the right vertebral artery and basilar and in both carotid arteries.  Skull: Negative Sinuses/Orbits: Mild mucosal edema right maxillary sinus. Bilateral cataract extraction. No orbital mass lesion. Other: None ASPECTS ([place] Stroke Program Early CT Score) - Ganglionic level infarction (caudate, lentiform nuclei, internal capsule, insula, M1-M3 cortex): 7 - Supraganglionic infarction (M4-M6 cortex): 3 Total score (0-10 with 10 being normal): 10 IMPRESSION: 1. No acute abnormality 2. ASPECTS is 10 3. Moderate atrophy. 4. Preliminary report texted to Dr. Rory Percy via [facility] Electronically Signed   By: Franchot Gallo M.D.   On: 05/10/2020 16:41    Labs:  CBC: Recent Labs    05/10/20 1627 05/10/20 2120 05/11/20 0252  WBC 5.6 5.7 5.5  HGB 13.4  13.9 14.2 13.5  HCT 41.1  41.0 43.0 41.2  PLT 90* 95* 96*    COAGS: Recent Labs    05/10/20 1627 05/11/20 0252 05/11/20 1231  INR 1.1  --   --   APTT 32 78*  126*    BMP: Recent Labs    05/10/20 1627 05/10/20 2120  NA 141  143 141  K 3.9  3.9 4.7  CL 107  106 104  CO2 22 24  GLUCOSE 112*  99 94  BUN 39*  39* 39*  CALCIUM 9.1 9.2  CREATININE 1.98*  1.80* 1.86*  GFRNONAA 30* 33*  GFRAA 35* 38*    LIVER FUNCTION TESTS: Recent Labs    05/10/20 1627 05/10/20 2120  BILITOT 1.2 1.0  AST 31 35  ALT 22 22  ALKPHOS 75 78  PROT 6.2* 6.2*  ALBUMIN 3.6 3.8     Assessment and Plan:  Proximal right ICA stenosis, progressed based on CTA. Dr. Estanislado Pandy was present for consultation.  Discussed symptoms and findings of recent CTA- specifically proximal right ICA stenosis. Explained this is probable cause of left-sided heaviness/weakness. Explained this stenosis has progressed based on imaging scans. Explained there are two management options moving forward- either continued conservative management including routine imaging scans to monitor for changes, or with an endovascular revascularization procedure. Explained procedure, including risks and benefits. Explained that we will need nephrology (for contrast use given CKD), pulmonology (for undergoing anesthesia), and cardiology clearance (to hold Eliquis) prior to procedure. Explained patient will need to hold Eliquis 48 hours prior to procedure, and begin Plavix 7 days prior to procedure. Patient asks that he has time to go home and talk with family before deciding which management option to pursue. Plan for follow-up with an image-guided cerebral arteriogram with intent to treat proximal right ICA (first available) pending nephrology/pulmonology/cardiology clearance versus carotid US in 4 months. Instructed patient to call us once he has decided which management option to pursue. Instructed patient to continue taking Eliquis 2.5 mg twice daily and Aspirin 81 mg once daily. Instructed patient to stay hydrated by drinking plenty of water.  All questions answered and concerns addressed. Patient  conveys understanding and agrees with plan.  Thank you for this interesting consult.  I greatly enjoyed meeting Derek [name] and look forward to participating in their care.  A copy of this report was sent to the requesting provider on this date.  Electronically Signed: Earley Abide, PA-C 05/24/2020, 11:16 AM   I spent a total of 40 Minutes in face to face in clinical consultation, greater than 50% of which was counseling/coordinating care for proximal right ICA stenosis.

## 2020-05-27 DIAGNOSIS — I6523 Occlusion and stenosis of bilateral carotid arteries: Secondary | ICD-10-CM | POA: Diagnosis not present

## 2020-05-27 DIAGNOSIS — I700 Atherosclerosis of aorta: Secondary | ICD-10-CM | POA: Diagnosis not present

## 2020-05-27 DIAGNOSIS — I712 Thoracic aortic aneurysm, without rupture: Secondary | ICD-10-CM | POA: Diagnosis not present

## 2020-05-27 DIAGNOSIS — N184 Chronic kidney disease, stage 4 (severe): Secondary | ICD-10-CM | POA: Diagnosis not present

## 2020-05-28 ENCOUNTER — Encounter: Payer: Self-pay | Admitting: Internal Medicine

## 2020-05-28 ENCOUNTER — Ambulatory Visit (INDEPENDENT_AMBULATORY_CARE_PROVIDER_SITE_OTHER): Payer: Medicare Other | Admitting: Internal Medicine

## 2020-05-28 ENCOUNTER — Other Ambulatory Visit: Payer: Self-pay

## 2020-05-28 DIAGNOSIS — I6300 Cerebral infarction due to thrombosis of unspecified precerebral artery: Secondary | ICD-10-CM | POA: Diagnosis not present

## 2020-05-28 DIAGNOSIS — J449 Chronic obstructive pulmonary disease, unspecified: Secondary | ICD-10-CM | POA: Diagnosis not present

## 2020-05-28 DIAGNOSIS — J9611 Chronic respiratory failure with hypoxia: Secondary | ICD-10-CM | POA: Diagnosis not present

## 2020-05-28 MED ORDER — STIOLTO RESPIMAT 2.5-2.5 MCG/ACT IN AERS: 2.0000 | INHALATION_SPRAY | Freq: Every day | RESPIRATORY_TRACT | 0 refills | Status: DC

## 2020-05-28 MED ORDER — STIOLTO RESPIMAT 2.5-2.5 MCG/ACT IN AERS
2.0000 | INHALATION_SPRAY | Freq: Every day | RESPIRATORY_TRACT | 0 refills | Status: DC
Start: 2020-05-28 — End: 2020-05-28

## 2020-05-28 NOTE — Assessment & Plan Note (Signed)
Placed 02 in [place] in May 2017  =  2lpm 24/7 but using prn instead  -   02/17/2017  Patient Saturations on Room Air at Rest = 95% Patient Saturations on Room Air while Ambulating = 88% p one lap = 185 ft mod pace Saturations on 2 Liters of oxygen while Ambulating = 95% - Patient 11/28/2019   Walked RA x one lap =  approx 250 ft -@ avg pace stopped due to hip pain, no sob/ sats 98%     as  Of 05/28/2020 using prn only but advised: Make sure you check your oxygen saturations at highest level of activity to be sure it stays over 90% and adjust upward to maintain this level if needed but remember to turn it back to previous settings when you stop (to conserve your supply).          Each maintenance medication was reviewed in detail including emphasizing most importantly the difference between maintenance and prns and under what circumstances the prns are to be triggered using an action plan format where appropriate.  Total time for H and P, chart review, counseling with pt and daughter, teaching device using a teachback method and generating customized AVS unique to this office visit / charting = 30 min

## 2020-05-28 NOTE — Progress Notes (Signed)
Subjective:    Patient ID: Derek [name], male   DOB: Jun 17, 1935,    MRN: [id number]    Brief patient profile:  38 yowm quit smoking 2001 "smoker's cough" and wt 210  resolved then started cough again 2008/9 assoc doe > rx  spiriva  helped  But cough continued/ worse in winter then in [place] 03/11/16 admit with pna/copd> back to baseline but referred to pulmonary clinic 04/02/2016 by Dr Wilson Singer ? Copd severity> proved to have GOLD II criteria 04/17/2016     History of Present Illness  04/02/2016 1st [facility] office visit/ [name]   Chief Complaint  Patient presents with  . Pulmonary Consult    Referred by Dr. Wilson Singer. Pt states 2 wks ago while in [place], he was hospitalized x 5 days for PNA and COPD exacerbation.  He states that today her breathing is "excellent". He has o2 at home but has not needed to use this.   has proair but not using while on spiriva dpi   Has 02 but not using  Off pred x one week s relapse - legs swelled on pred, some better off but not resolved  Not limited by breathing from desired activities  But not aerobically active = MMRC1 = can walk nl pace, flat grade, can't hurry or go uphills or steps s sob rec No change   except ok to take furosemide 20 mg one daily if needed for swelling   Plan A = Automatic = Spiriva one daily each am Plan B = Backup Only use your albuterol(proair) as rescue    01/31/2018  f/u ov/Draiden Mirsky re: gold ii/ no longer using 02  Chief Complaint  Patient presents with  . Follow-up    morning productive cough gray no blood present, no CP, SOB, or Wheezing. Hoarsness and increased mucus production when goes long periods of no speaking.   Dyspnea:  Gradually making progress = MMRC1 = can walk nl pace, flat grade, can't hurry or go uphills or steps s sob  Cough:dry and  assoc with voice raspy on dpi spiriva (va doesn't have smi on formulary) Sleep: ok s 02 / mostly on side rec Try off spiriva for now and see if you don't do just as well with  exercise and less irritation of your throat If worse > spiriva respimat (different device requires coaching) would be best choice      08/01/2018  f/u ov/[name] re:  GOLD II/ breathing better, cough worse  Chief Complaint  Patient presents with  . Follow-up    Breathing is doing well. He no longer has a rescue inhaler.   Dyspnea:  MMRC1 = can walk nl pace, flat grade, can't hurry or go uphills or steps s sob / L leg pain stops first  Cough: more daytime than night/  freq throat clearing/ worse in hot humid weather  X 2 year pattern   Sleeping: on either side/ not on 2  SABA use: not suing  02: has but not using   L lateral cp with cough x 6 m present daily  rec Pantoprazole (protonix) 40 mg   Take  30-60 min before first meal of the day and Pepcid (famotidine)  20 mg one @  bedtime GERD diet  Prednisone 10 mg take  4 each am x 2 days,   2 each am x 2 days,  1 each am x 2 days and stop  Change symbicort to 80 Take 2 puffs first thing in am and  then another 2 puffs about 12 hours later.  Please remember to go to the  x-ray department downstairs in the basement  for your tests - we will call you with the results when they are available. Please schedule a follow up office visit in 4 weeks   08/29/2018  f/u ov/[name] re:  COPD II / cough still same esp out in heat / humidity  Chief Complaint  Patient presents with  . Follow-up    Breathing is unchanged.   Dyspnea:  L leg limiting > sob  Cough: on heat/humidity exp Sleeping: ok at around 40 degrees  SABA use: none 02: has not using Rec Continue symbicort 80 Take 2 puffs first thing in am and then another 2 puffs about 12 hours later.  Work on [employer]:    Try off protonix (pantoprazole) and pecid (famotidine)    11/28/2019  f/u ov/[name] re: COPD II on symb 80 2bid / pepcid 20 mg hs  Chief Complaint  Patient presents with  . Follow-up    Breathing is unchanged since the last visit and no new co's.   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 Assoc L hemiparesis resolved  Chief Complaint  Patient presents with  . Follow-up    6 month f/u for COPD. States his breathing has been stable since last visit. Wants to discuss going to back to Symbicort.   Dyspnea:  Shower/ 50 ft walking but not wearing 02  Or checking sats / off maint rx x Jan 2021  Cough:worse since stopped advair at least Jan 2021 but minimal grey mucus production  Sleeping: on side / bed is flat one pillow  SABA use: none 02: very rarely    No obvious day to day or daytime variability or assoc excess/ purulent sputum or mucus plugs or hemoptysis or cp or chest tightness, subjective wheeze or overt sinus or hb symptoms.   Sleeping  without nocturnal  or early am exacerbation  of respiratory  c/o's or need for noct saba. Also denies any obvious fluctuation of symptoms with weather or environmental changes or other aggravating or alleviating factors except as outlined above   No unusual exposure hx or h/o childhood pna/ asthma or knowledge of premature birth.  Current Allergies, Complete Past Medical History, Past Surgical History, Family History, and Social History were reviewed in [employer] record.  ROS  The following are not active complaints unless bolded Hoarseness, sore throat, dysphagia, dental problems, itching, sneezing,  nasal congestion or discharge of excess mucus or purulent secretions, ear ache,   fever, chills, sweats, unintended wt loss or wt gain, classically pleuritic or exertional cp,  orthopnea pnd  or arm/hand swelling  or leg swelling, presyncope, palpitations, abdominal pain, anorexia, nausea, vomiting, diarrhea  or change in bowel habits or change in bladder habits, change in stools or change in urine, dysuria, hematuria,  rash, arthralgias, visual complaints, headache, numbness, weakness or ataxia or problems with walking or coordination,  change in mood or  memory.        Current Meds  Medication Sig  . acetaminophen (TYLENOL) 500 MG tablet Take 500 mg by mouth every 6 (six) hours as needed for moderate pain or headache.  . allopurinol (ZYLOPRIM) 100 MG tablet Take 1 tablet by mouth 2 (two) times a day.  Marland Kitchen aspirin EC 81 MG tablet Take 81 mg by mouth daily after supper.  Marland Kitchen atorvastatin (LIPITOR) 40 MG tablet Take 40 mg by mouth daily.  . Biotin 5000 MCG TABS Take 5,000 mcg by mouth daily.  . budesonide-formoterol (SYMBICORT) 80-4.5 MCG/ACT inhaler Take 2 puffs first thing in am and then another 2 puffs about 12 hours later. (Patient taking differently: Inhale 2 puffs into the lungs daily as needed (for wheezing or shortness of breath). )  . ELIQUIS 2.5 MG TABS tablet TAKE 1 TABLET TWICE A DAY (Patient taking differently: Take 2.5 mg by mouth 2 (two) times daily. )  . ezetimibe (ZETIA) 10 MG tablet Take 10 mg by mouth daily after supper.   . famotidine (PEPCID) 20 MG tablet One at bedtime (Patient taking differently: Take 20 mg by mouth at bedtime. One at bedtime)  . fluticasone (FLONASE) 50 MCG/ACT nasal spray Place 1 spray into both nostrils daily as needed for allergies or rhinitis.  . furosemide (LASIX) 20 MG tablet TAKE 1 TABLET DAILY AS NEEDED FOR SWELLING (Patient taking differently: Take 20 mg by mouth daily as needed for fluid. )  . levothyroxine (SYNTHROID, LEVOTHROID) 88 MCG tablet Take 88 mcg by mouth daily before breakfast.  . Magnesium 250 MG TABS Take 250 mg by mouth daily.  . Multiple Vitamin (MULTIVITAMIN WITH MINERALS) TABS tablet Take 1 tablet by mouth daily.  . Omega-3  Fatty Acids (FISH OIL) 1000 MG CAPS Take 1,000 mg by mouth daily.   . OXYGEN Inhale 2 L into the lungs as needed (for  shortness of breath with exertion only).   . pregabalin (LYRICA) 50 MG capsule Take 50 mg by mouth daily.   Marland Kitchen terazosin (HYTRIN) 5 MG capsule Take 10 mg by mouth 2 (two) times daily.  . Turmeric 500 MG CAPS Take 500 mg by mouth daily.                    Objective:   Physical Exam  Amb somber wm nad with somewhat rattling cough   Vital signs reviewed  05/28/2020  - Note at rest 02 sats  97% on RA     05/28/2020          202  11/28/2019        203  08/29/2018          190  08/01/2018        192    01/31/2018        195  10/27/2017        203  07/27/2017          221  04/26/2017          226 02/17/2017        231  10/27/2016        234  07/27/2016          230  04/17/2016        232  04/02/16 239 lb 3.4 oz (108.505 kg)  11/06/15 226 lb (102.513 kg)  04/02/15 236 lb (107.049 kg)        reports Edentulous   HEENT : pt wearing mask not  removed for exam due to covid - 19 concerns.    NECK :  without JVD/Nodes/TM/ nl carotid upstrokes bilaterally   LUNGS: no acc muscle use,  Mild barrel  contour chest wall with bilateral  Distant bs s audible wheeze and  without cough on insp or exp maneuvers  and mild  Hyperresonant  to  percussion bilaterally     CV:  RRR  no s3 or murmur or increase in P2, and  Trace bilateral LE pitting edema   ABD:  soft and nontender with pos end  insp Hoover's  in the supine position. No bruits or organomegaly appreciated, bowel sounds nl  MS:   Slow gait/  ext warm without deformities, calf tenderness, cyanosis or clubbing No obvious joint restrictions   SKIN: warm and dry without lesions    NEURO:  alert, approp, nl sensorium with  no motor or cerebellar deficits apparent.              .       Assessment:

## 2020-05-28 NOTE — Assessment & Plan Note (Signed)
Quit smoking 2001  - PFT's  04/17/2016  FEV1 2.00 (72 % ) ratio 60  p no % improvement from saba p no prior to study with DLCO  38 % corrects to 50 % for alv volume   - 7//2017 worse on spiriva monotherapy > added advair 100 > ov 07/27/2016 changed to advair 250 - 07/27/2017  change advair to symb 160 2bid due to hoarseness / continue spiriva dpi as gets it free from [place] - 01/31/2018 try off spiriva dpi due to throat irritation  - 08/01/2018 try lower symb to 80 2bid due to cough/ throat clearing   - 05/28/2020  After extensive coaching inhaler device,  effectiveness =    90% with smi >try stiolto 2 each am    At this point cough /congestion but no wheezing off maint rx x months so Pt may now be Group B in terms of symptom/risk and laba/lama therefore appropriate rx at this point >>> trial of stiolto which is the least likely to cause cough.    Advised:  formulary restrictions will be an ongoing challenge for the forseable future and I would be happy to pick an alternative if the pt will first  provide me a list of them -  pt  will need to return here for training for any new device that is required eg dpi vs hfa vs respimat.    In the meantime we can always provide samples so that the patient never runs out of any needed respiratory medications and did so today.

## 2020-05-28 NOTE — Patient Instructions (Addendum)
Drug formulary from your insurance company :  dulera 100 is same as symbicort 80 if not then advair 44 should do either 2 puffs every 12 hours   Make sure you check your oxygen saturations at highest level of activity to be sure it stays over 90% and adjust upward to maintain this level if needed but remember to turn it back to previous settings when you stop (to conserve your supply).   For cough > mucinex up to 1200 mg every 12 hours   stiolto 2 puffs every morning   - any side effects just reduce to once a day    Please schedule a follow up office visit in 6 weeks, call sooner if needed

## 2020-05-31 ENCOUNTER — Ambulatory Visit (HOSPITAL_COMMUNITY)
Admission: RE | Admit: 2020-05-31 | Discharge: 2020-05-31 | Disposition: A | Discharge status: Home / Self Care | Payer: Medicare Other | Source: Ambulatory Visit | Attending: Neurology | Admitting: Neurology

## 2020-05-31 DIAGNOSIS — G459 Transient cerebral ischemic attack, unspecified: Secondary | ICD-10-CM | POA: Diagnosis not present

## 2020-05-31 DIAGNOSIS — R413 Other amnesia: Secondary | ICD-10-CM | POA: Diagnosis not present

## 2020-06-06 ENCOUNTER — Telehealth: Payer: Self-pay | Admitting: Internal Medicine

## 2020-06-06 NOTE — Telephone Encounter (Signed)
Spoke with patient. He stated that he has developed thrush. He noticed small white patches on his gum on 6/14 near where his partials sit. He stated that it may have come from the [facility] but he does not want to stop taking it as it is working well for him. He is requesting to have to something called into the pharmacy for him.   [facility] thrush medication [facility] [address].   While on the phone, he stated that he was given a sample of Stiolto at his visit and wants to have a RX sent to [facility] if MW is ok with this.   MW, please advise. Thanks!

## 2020-06-06 NOTE — Telephone Encounter (Signed)
Called patient but he did not answer. His VM is not setup as well. Will attempt to call him back later today.

## 2020-06-06 NOTE — Telephone Encounter (Signed)
Not due to stiolto but easy to rx and no need to change stiolto rx  Clotrimazole troch 10 mg up to qid prn   #20   Refill x 3

## 2020-06-07 NOTE — Telephone Encounter (Signed)
ATC pt, there was no answer and I could not leave a message due to his VM not being set up. Will try back. 

## 2020-06-10 ENCOUNTER — Telehealth: Payer: Self-pay | Admitting: Internal Medicine

## 2020-06-10 MED ORDER — STIOLTO RESPIMAT 2.5-2.5 MCG/ACT IN AERS: 2.0000 | INHALATION_SPRAY | Freq: Every day | RESPIRATORY_TRACT | 3 refills | Status: DC

## 2020-06-10 NOTE — Telephone Encounter (Signed)
ATC patient x3 no answer and unable to leave voicemail.

## 2020-06-10 NOTE — Telephone Encounter (Signed)
Spoke with pt. He is needing a prescription sent to [facility] for [employer]. Rx has been sent in. Nothing further was needed.

## 2020-06-11 NOTE — Telephone Encounter (Signed)
We have attempted to contact pt several times with no success or call back from pt. Per triage protocol, message will be closed.  

## 2020-06-18 ENCOUNTER — Ambulatory Visit (INDEPENDENT_AMBULATORY_CARE_PROVIDER_SITE_OTHER): Payer: Medicare Other | Admitting: Neurology

## 2020-06-18 ENCOUNTER — Encounter: Payer: Self-pay | Admitting: Neurology

## 2020-06-18 ENCOUNTER — Other Ambulatory Visit: Payer: Self-pay

## 2020-06-18 VITALS — BP 120/70 | HR 79 | Ht 69.0 in | Wt 207.0 lb

## 2020-06-18 DIAGNOSIS — G459 Transient cerebral ischemic attack, unspecified: Secondary | ICD-10-CM

## 2020-06-18 DIAGNOSIS — I6521 Occlusion and stenosis of right carotid artery: Secondary | ICD-10-CM

## 2020-06-18 NOTE — Progress Notes (Signed)
[facility] [address] [place]. [place] [address] [phone]       OFFICE FOLLOW-UP NOTE  Derek. Derek [name] Date of Birth:  1935/02/03 Medical Record Number:  [id number]   HPI: Derek [name] is a 85 year old male seen today for the first office follow-up visit following hospital admissions for posterior circulation TIA in September 2018 and right thalamic infarct in October 2018.  History is obtained from the patient, review of electronic medical records and I have personally reviewed imaging films. Derek Age Oxfordis a 85 y.o.malewas a past medical history of hypertension, coronary artery disease status post CABG, COPDon home oxygen, and hypothyroidism, who was in his usual state of health until about 5:30 PM when he had sudden onset ofunresponsiveness witnessed by his girlfriend.They were cooking at home and he suddenly became unresponsive. She called 911 immediately. The patient was evaluated by EMS and found to have right-sided weakness and was not answering questions appropriately.Activated large vessel occlusion code stroke and brought the patient to the ER.In the emergency room, at the bridge on first examination, the patient was lethargic, dysarthric, poor attention concentration and nonfocal in terms of weakness.He was taken in for a stat noncontrast CT of the head, that revealed a heavily calcified right vertebral artery and basilar artery on the noncontrast CT of the head, and also had a suspicious looking finding which was concerning for thrombus on the distal basilar.This was confirmed with the neuroradiologist on call who also agreed with the finding of possible thrombus in the setting of a heavily calcified basilar.Repeat exam of the patient showed disconjugate gaze, inability to cross midline to the right and worsening dysarthria.He was also at that time weak on the right upper extremity.At that time, decision was made to use IV TPA.A CT angiogram of the head was  done, which showed a heavily calcified right vertebral artery, stenotic basilar with probable distal occlusion, and left P2 occlusion.Left vertebral artery was nondominant and ended in a PICA.As these imaging studies were done, we also ordered a stat MRI brain to evaluate for any evidence of infarct.Neuro interventionist, Dr. Estanislado Pandy, was also contacted and reviewed the images. MRI of the brain did not reveal any evidence of acute ischemia in the brainstem. The patient's exam also started to improve,with him becoming more awake, following all commands, speech becoming less dysarthric. He still did have mild right facial droop and mild right upper extremity weakness. His gaze had no restriction and extraocular movements became intact.  EKG showed atrial fibrillation with a slow ventricular rate.  Cerebral catheter angiogram showed right vertebral artery origin acute occlusion with partial distal reconstitution.  Right ICA showed 75% stenosis.  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  2D echo showed normal ejection fraction LDL cholesterol was 46 mg percent and hemoglobin A1c was 5.4.  Patient was started on aspirin 81 mg and Eliquis 2.5 mg twice daily.  Cardiology was consulted.  Patient's bradycardia was presumed to be AV block and resolved after beta-blockers were discontinued.  Patient was discharged home but returned on 10/03/17 with symptoms of sudden onset of dizziness lightheadedness and left-sided weakness.  He was on Eliquis hence not a candidate for anticoagulation but MRI scan  showed a right thalamic infarct.  Stroke of workup was not repeated since he had had a recent workup a few weeks ago.   Since the present episode occurred while on Eliquis for a few weeks alternatives like changing over to Pradaxa or Xarelto were discussed but due to lack of definite clinical data showing benefit over Eliquis patient decided to stay on Eliquis.  Patient states is done well since discharge.  He had persistent bradycardia and underwent pacemaker insertion on 10/11/17 which went well but he does complain of some tightness in his left arm, chest and shoulder following pacemaker insertion.  He is tolerating Eliquis well without bleeding but does bruise easily.  His blood pressure is well controlled today it is 1 2 9/7 5.  Is tolerating Crestor and Zetia without muscle aches or pains.  He does take some Lyrica for peripheral neuropathy.  He has no new complaints. Update 06/18/2020 : He returns for follow-up after last visit with me several years ago.  He was recently hospitalized for evaluation for TIA in May 2021.  He presented with sudden onset of confusion, memory difficulties and facial droop on the left which was transient and resolved after admission.  NIH stroke scale was 1. CT scan showed no acute abnormality.  He had a pacemaker and hence did not get an MRI as it was a weekend and was advised to come back next week for outpatient MRI but patient refused.  In fact was found to have increasing carotid stenosis on the CT angiogram on the right which was felt to be symptomatic but the patient refused to see Dr. Estanislado Pandy and consider endovascular treatment.  He states is done well since discharge.  He gets tired quite easily.  He is seen his pulmonologist who started him on as needed oxygen which seems to work.  Even activities like having a shower makes him quite short of breath and using oxygen health.  He has A. fib and is on Eliquis but takes it only once a day as it makes him bleed quite a bit.  He also takes a baby aspirin.  He has some intermittent headaches the left temporal hemicranial distribution which  also seem to respond to oxygen well.  He has no new complaints. ROS:   14 system review of systems is positive for hearing loss,  , wheezing, joint pain, walking difficulty, skin wounds, l slurred speech, confusion, fatigue, shortness of breath, tirednessheadache, , and all other systems negative.  PMH:  Past Medical History:  Diagnosis Date  . AAA (abdominal aortic aneurysm) ([place])   . Arthritis   . Chronic kidney disease   . COPD (chronic obstructive pulmonary disease) ([place])   . Coronary artery disease   . Headache   . Hypertension   . Hypothyroidism   . Myocardial infarction ([place])   . Presence of permanent cardiac pacemaker   . Shortness of breath    on exertion;  has productive cough at times  . Sleep apnea    states mild . no cpap  . Stroke [facility])     Social History:  Social History   Socioeconomic History  . Marital status: Divorced    Spouse name: Not on file  . Number of children: Not on file  . Years of education: 12+  . Highest education level: Not on file  Occupational History  . Occupation: Retired   Tobacco Use  . Smoking status: Former Smoker    Packs/day: 0.75    Years:  51.00    Pack years: 38.25    Types: Cigarettes    Quit date: 02/23/2000    Years since quitting: 20.3  . Smokeless tobacco: Never Used  Vaping Use  . Vaping Use: Never used  Substance and Sexual Activity  . Alcohol use: Yes    Alcohol/week: 0.0 - 1.0 standard drinks    Comment: Moderate to little  . Drug use: No  . Sexual activity: Not on file  Other Topics Concern  . Not on file  Social History Narrative   Patient lives at home alone    Patient is retired    Patient has 3 years of college    Social Determinants of Radio broadcast assistant Strain:   . Difficulty of Paying Living Expenses:   Food Insecurity:   . Worried About Charity fundraiser in the Last Year:   . Arboriculturist in the Last Year:   Transportation Needs:   . Film/video editor (Medical):   Marland Kitchen Lack  of Transportation (Non-Medical):   Physical Activity:   . Days of Exercise per Week:   . Minutes of Exercise per Session:   Stress:   . Feeling of Stress :   Social Connections:   . Frequency of Communication with Friends and Family:   . Frequency of Social Gatherings with Friends and Family:   . Attends Religious Services:   . Active Member of Clubs or Organizations:   . Attends Archivist Meetings:   Marland Kitchen Marital Status:   Intimate Partner Violence:   . Fear of Current or Ex-Partner:   . Emotionally Abused:   Marland Kitchen Physically Abused:   . Sexually Abused:     Medications:   Current Outpatient Medications on File Prior to Visit  Medication Sig Dispense Refill  . acetaminophen (TYLENOL) 500 MG tablet Take 500 mg by mouth every 6 (six) hours as needed for moderate pain or headache.    . allopurinol (ZYLOPRIM) 100 MG tablet Take 1 tablet by mouth 2 (two) times a day.    Marland Kitchen apixaban (ELIQUIS) 2.5 MG TABS tablet Take 2.5 mg by mouth daily.     Marland Kitchen aspirin EC 81 MG tablet Take 81 mg by mouth daily after supper.    Marland Kitchen atorvastatin (LIPITOR) 40 MG tablet Take 40 mg by mouth daily.    . Biotin 5000 MCG TABS Take 5,000 mcg by mouth daily.    Marland Kitchen ezetimibe (ZETIA) 10 MG tablet Take 10 mg by mouth daily after supper.     . famotidine (PEPCID) 20 MG tablet One at bedtime (Patient taking differently: Take 20 mg by mouth at bedtime. One at bedtime) 30 tablet 11  . fluticasone (FLONASE) 50 MCG/ACT nasal spray Place 1 spray into both nostrils daily as needed for allergies or rhinitis.    . furosemide (LASIX) 20 MG tablet TAKE 1 TABLET DAILY AS NEEDED FOR SWELLING (Patient taking differently: Take 20 mg by mouth daily as needed for fluid. ) 90 tablet 3  . levothyroxine (SYNTHROID, LEVOTHROID) 88 MCG tablet Take 88 mcg by mouth daily before breakfast.    . Magnesium 250 MG TABS Take 250 mg by mouth daily.    . Multiple Vitamin (MULTIVITAMIN WITH MINERALS) TABS tablet Take 1 tablet by mouth daily.    .  Omega-3 Fatty Acids (FISH OIL) 1000 MG CAPS Take 1,000 mg by mouth daily.     . OXYGEN Inhale 2 L into the lungs as needed (for  shortness  of breath with exertion only).     . pregabalin (LYRICA) 50 MG capsule Take 50 mg by mouth daily.     Marland Kitchen terazosin (HYTRIN) 5 MG capsule Take 10 mg by mouth 2 (two) times daily.    . Tiotropium Bromide-Olodaterol (STIOLTO RESPIMAT) 2.5-2.5 MCG/ACT AERS Inhale 2 puffs into the lungs daily. 12 g 3  . Turmeric 500 MG CAPS Take 500 mg by mouth daily.     No current facility-administered medications on file prior to visit.    Allergies:   Allergies  Allergen Reactions  . Other Hives, Dermatitis and Other (See Comments)    -mycins  . Sulfa Antibiotics Rash  . Latex Rash  . Tape Rash and Other (See Comments)    Reaction to adhesive tape - pls use paper tape    Physical Exam General: Mildly obese elderly male cholesterol, seated, in no evident distress Head: head normocephalic and atraumatic.  Neck: supple with no carotid or supraclavicular bruits Cardiovascular: regular rate and rhythm, no murmurs Musculoskeletal: no deformity Skin:  no rash/petichiae Vascular:  Normal pulses all extremities Vitals:   06/18/20 1318  BP: 120/70  Pulse: 79   Neurologic Exam Mental Status: Awake and fully alert. Oriented to place and time. Recent and remote memory intact. Attention span, concentration and fund of knowledge appropriate. Mood and affect appropriate.  Cranial Nerves: Fundoscopic exam not done. Pupils equal, briskly reactive to light. Extraocular movements full without nystagmus. Visual fields full to confrontation. Hearing mildly diminished bilaterally facial sensation intact. Face, tongue, palate moves normally and symmetrically.  Motor: Normal bulk and tone. Normal strength in all tested extremity muscles. Sensory.: intact to touch ,pinprick .position and vibratory sensation.  Coordination: Rapid alternating movements normal in all extremities.  Finger-to-nose and heel-to-shin performed accurately bilaterally. Gait and Station: Arises from chair without difficulty. Stance is normal. Gait demonstrates normal stride length and balance but favors right leg due to knee pain. Unable to heel, toe and tandem walk  .  Reflexes: 1+ and symmetric. Toes downgoing.   NIHSS  0 Modified Rankin  1   ASSESSMENT: 85 year male with right thalamic infarct in October 2637 of embolic etiology due to atrial fibrillation while on Eliquis with residual mild left hemibody paresthesias.  Recent prior history of posterior circulation TIA in September 2018 in treated with iv  TPA with full recovery.  Recent episode of right brain TIA in May 2021 likely due to symptomatic right carotid stenosis but patient is refusing revascularization.    PLAN: I had a long d/w patient about his recent TIA and remote strokes, atrial fibrillation, risk for recurrent stroke/TIAs, personally independently reviewed imaging studies and stroke evaluation results and answered questions.Continue aspirin 81 mg daily  And Eliquis for secondary stroke prevention and maintain strict control of hypertension with blood pressure goal below 130/90, diabetes with hemoglobin A1c goal below 6.5% and lipids with LDL cholesterol goal below 70 mg/dL. I also advised the patient to eat a healthy diet with plenty of whole grains, cereals, fruits and vegetables, exercise regularly and maintain ideal body weight Followup in the future with me in 6 months or call earlier if needed. Greater than 50% of time during this 30 minute visit was spent on counseling,explanation of diagnosis of TIA,embolic strokes, atrial fibrillation planning of further management, discussion with patient and family and coordination of care Derek Contras, MD  [facility] [address] [place], [place] [address]  Phone [phone] Fax [phone] Note: This document was  prepared with digital  dictation and possible smart phrase technology. Any transcriptional errors that result from this process are unintentional

## 2020-06-18 NOTE — Patient Instructions (Addendum)
I had a long d/w patient about his recent TIA and remote strokes, atrial fibrillation, risk for recurrent stroke/TIAs, personally independently reviewed imaging studies and stroke evaluation results and answered questions.Continue aspirin 81 mg daily  And Eliquis for secondary stroke prevention and maintain strict control of hypertension with blood pressure goal below 130/90, diabetes with hemoglobin A1c goal below 6.5% and lipids with LDL cholesterol goal below 70 mg/dL. I also advised the patient to eat a healthy diet with plenty of whole grains, cereals, fruits and vegetables, exercise regularly and maintain ideal body weight Followup in the future with me in 6 months or call earlier if needed.  Stroke Prevention Some medical conditions and behaviors are associated with a higher chance of having a stroke. You can help prevent a stroke by making nutrition, lifestyle, and other changes, including managing any medical conditions you may have. What nutrition changes can be made?   Eat healthy foods. You can do this by: ? Choosing foods high in fiber, such as fresh fruits and vegetables and whole grains. ? Eating at least 5 or more servings of fruits and vegetables a day. Try to fill half of your plate at each meal with fruits and vegetables. ? Choosing lean protein foods, such as lean cuts of meat, poultry without skin, fish, tofu, beans, and nuts. ? Eating low-fat dairy products. ? Avoiding foods that are high in salt (sodium). This can help lower blood pressure. ? Avoiding foods that have saturated fat, trans fat, and cholesterol. This can help prevent high cholesterol. ? Avoiding processed and premade foods.  Follow your health care provider's specific guidelines for losing weight, controlling high blood pressure (hypertension), lowering high cholesterol, and managing diabetes. These may include: ? Reducing your daily calorie intake. ? Limiting your daily sodium intake to 1,500 milligrams  (mg). ? Using only healthy fats for cooking, such as olive oil, canola oil, or sunflower oil. ? Counting your daily carbohydrate intake. What lifestyle changes can be made?  Maintain a healthy weight. Talk to your health care provider about your ideal weight.  Get at least 30 minutes of moderate physical activity at least 5 days a week. Moderate activity includes brisk walking, biking, and swimming.  Do not use any products that contain nicotine or tobacco, such as cigarettes and e-cigarettes. If you need help quitting, ask your health care provider. It may also be helpful to avoid exposure to secondhand smoke.  Limit alcohol intake to no more than 1 drink a day for nonpregnant women and 2 drinks a day for men. One drink equals 12 oz of beer, 5 oz of wine, or 1 oz of hard liquor.  Stop any illegal drug use.  Avoid taking birth control pills. Talk to your health care provider about the risks of taking birth control pills if: ? You are over 64 years old. ? You smoke. ? You get migraines. ? You have ever had a blood clot. What other changes can be made?  Manage your cholesterol levels. ? Eating a healthy diet is important for preventing high cholesterol. If cholesterol cannot be managed through diet alone, you may also need to take medicines. ? Take any prescribed medicines to control your cholesterol as told by your health care provider.  Manage your diabetes. ? Eating a healthy diet and exercising regularly are important parts of managing your blood sugar. If your blood sugar cannot be managed through diet and exercise, you may need to take medicines. ? Take any prescribed medicines to  control your diabetes as told by your health care provider.  Control your hypertension. ? To reduce your risk of stroke, try to keep your blood pressure below 130/80. ? Eating a healthy diet and exercising regularly are an important part of controlling your blood pressure. If your blood pressure cannot  be managed through diet and exercise, you may need to take medicines. ? Take any prescribed medicines to control hypertension as told by your health care provider. ? Ask your health care provider if you should monitor your blood pressure at home. ? Have your blood pressure checked every year, even if your blood pressure is normal. Blood pressure increases with age and some medical conditions.  Get evaluated for sleep disorders (sleep apnea). Talk to your health care provider about getting a sleep evaluation if you snore a lot or have excessive sleepiness.  Take over-the-counter and prescription medicines only as told by your health care provider. Aspirin or blood thinners (antiplatelets or anticoagulants) may be recommended to reduce your risk of forming blood clots that can lead to stroke.  Make sure that any other medical conditions you have, such as atrial fibrillation or atherosclerosis, are managed. What are the warning signs of a stroke? The warning signs of a stroke can be easily remembered as BEFAST.  B is for balance. Signs include: ? Dizziness. ? Loss of balance or coordination. ? Sudden trouble walking.  E is for eyes. Signs include: ? A sudden change in vision. ? Trouble seeing.  F is for face. Signs include: ? Sudden weakness or numbness of the face. ? The face or eyelid drooping to one side.  A is for arms. Signs include: ? Sudden weakness or numbness of the arm, usually on one side of the body.  S is for speech. Signs include: ? Trouble speaking (aphasia). ? Trouble understanding.  T is for time. ? These symptoms may represent a serious problem that is an emergency. Do not wait to see if the symptoms will go away. Get medical help right away. Call your local emergency services (911 in the [place] Do not drive yourself to the hospital.  Other signs of stroke may include: ? A sudden, severe headache with no known cause. ? Nausea or vomiting. ? Seizure. Where to  find more information For more information, visit:  American Stroke Association: www.strokeassociation.org  National Stroke Association: www.stroke.org Summary  You can prevent a stroke by eating healthy, exercising, not smoking, limiting alcohol intake, and managing any medical conditions you may have.  Do not use any products that contain nicotine or tobacco, such as cigarettes and e-cigarettes. If you need help quitting, ask your health care provider. It may also be helpful to avoid exposure to secondhand smoke.  Remember BEFAST for warning signs of stroke. Get help right away if you or a loved one has any of these signs. This information is not intended to replace advice given to you by your health care provider. Make sure you discuss any questions you have with your health care provider. Document Revised: 11/19/2017 Document Reviewed: 01/12/2017 Elsevier Patient Education  2020 [employer].

## 2020-07-10 DIAGNOSIS — N189 Chronic kidney disease, unspecified: Secondary | ICD-10-CM | POA: Diagnosis not present

## 2020-07-10 DIAGNOSIS — M150 Primary generalized (osteo)arthritis: Secondary | ICD-10-CM | POA: Diagnosis not present

## 2020-07-10 DIAGNOSIS — D696 Thrombocytopenia, unspecified: Secondary | ICD-10-CM | POA: Diagnosis not present

## 2020-07-10 DIAGNOSIS — Z79899 Other long term (current) drug therapy: Secondary | ICD-10-CM | POA: Diagnosis not present

## 2020-07-10 DIAGNOSIS — M1A09X0 Idiopathic chronic gout, multiple sites, without tophus (tophi): Secondary | ICD-10-CM | POA: Diagnosis not present

## 2020-07-11 DIAGNOSIS — E039 Hypothyroidism, unspecified: Secondary | ICD-10-CM | POA: Diagnosis not present

## 2020-07-11 DIAGNOSIS — J449 Chronic obstructive pulmonary disease, unspecified: Secondary | ICD-10-CM | POA: Diagnosis not present

## 2020-07-11 DIAGNOSIS — N184 Chronic kidney disease, stage 4 (severe): Secondary | ICD-10-CM | POA: Diagnosis not present

## 2020-07-11 DIAGNOSIS — Z950 Presence of cardiac pacemaker: Secondary | ICD-10-CM | POA: Diagnosis not present

## 2020-07-11 DIAGNOSIS — I129 Hypertensive chronic kidney disease with stage 1 through stage 4 chronic kidney disease, or unspecified chronic kidney disease: Secondary | ICD-10-CM | POA: Diagnosis not present

## 2020-07-11 DIAGNOSIS — I2510 Atherosclerotic heart disease of native coronary artery without angina pectoris: Secondary | ICD-10-CM | POA: Diagnosis not present

## 2020-07-11 DIAGNOSIS — N179 Acute kidney failure, unspecified: Secondary | ICD-10-CM | POA: Diagnosis not present

## 2020-07-11 DIAGNOSIS — N189 Chronic kidney disease, unspecified: Secondary | ICD-10-CM | POA: Diagnosis not present

## 2020-07-11 DIAGNOSIS — I639 Cerebral infarction, unspecified: Secondary | ICD-10-CM | POA: Diagnosis not present

## 2020-07-11 DIAGNOSIS — I714 Abdominal aortic aneurysm, without rupture: Secondary | ICD-10-CM | POA: Diagnosis not present

## 2020-07-11 DIAGNOSIS — E1122 Type 2 diabetes mellitus with diabetic chronic kidney disease: Secondary | ICD-10-CM | POA: Diagnosis not present

## 2020-07-11 DIAGNOSIS — I4891 Unspecified atrial fibrillation: Secondary | ICD-10-CM | POA: Diagnosis not present

## 2020-07-11 DIAGNOSIS — N2581 Secondary hyperparathyroidism of renal origin: Secondary | ICD-10-CM | POA: Diagnosis not present

## 2020-07-19 ENCOUNTER — Ambulatory Visit (INDEPENDENT_AMBULATORY_CARE_PROVIDER_SITE_OTHER): Payer: Medicare Other | Admitting: *Deleted

## 2020-07-19 DIAGNOSIS — I4819 Other persistent atrial fibrillation: Secondary | ICD-10-CM | POA: Diagnosis not present

## 2020-07-19 LAB — CUP PACEART REMOTE DEVICE CHECK
Battery Remaining Longevity: 108 mo
Battery Voltage: 3 V
Brady Statistic AP VP Percent: 0 %
Brady Statistic AP VS Percent: 0 %
Brady Statistic AS VP Percent: 99.72 %
Brady Statistic AS VS Percent: 0.28 %
Brady Statistic RA Percent Paced: 0 %
Brady Statistic RV Percent Paced: 99.72 %
Date Time Interrogation Session: 20210729212159
Implantable Lead Implant Date: 20181015
Implantable Lead Implant Date: 20181015
Implantable Lead Location: 753859
Implantable Lead Location: 753860
Implantable Lead Model: 5076
Implantable Lead Model: 5076
Implantable Pulse Generator Implant Date: 20181015
Lead Channel Impedance Value: 266 Ohm
Lead Channel Impedance Value: 380 Ohm
Lead Channel Impedance Value: 380 Ohm
Lead Channel Impedance Value: 456 Ohm
Lead Channel Pacing Threshold Amplitude: 0.625 V
Lead Channel Pacing Threshold Pulse Width: 0.4 ms
Lead Channel Sensing Intrinsic Amplitude: 0.625 mV
Lead Channel Sensing Intrinsic Amplitude: 0.875 mV
Lead Channel Sensing Intrinsic Amplitude: 12 mV
Lead Channel Sensing Intrinsic Amplitude: 12 mV
Lead Channel Setting Pacing Amplitude: 2.5 V
Lead Channel Setting Pacing Pulse Width: 0.4 ms
Lead Channel Setting Sensing Sensitivity: 1.2 mV

## 2020-07-23 ENCOUNTER — Ambulatory Visit (INDEPENDENT_AMBULATORY_CARE_PROVIDER_SITE_OTHER): Payer: Medicare Other | Admitting: Internal Medicine

## 2020-07-23 ENCOUNTER — Encounter: Payer: Self-pay | Admitting: Internal Medicine

## 2020-07-23 ENCOUNTER — Other Ambulatory Visit: Payer: Self-pay

## 2020-07-23 DIAGNOSIS — J449 Chronic obstructive pulmonary disease, unspecified: Secondary | ICD-10-CM | POA: Diagnosis not present

## 2020-07-23 DIAGNOSIS — I6521 Occlusion and stenosis of right carotid artery: Secondary | ICD-10-CM | POA: Diagnosis not present

## 2020-07-23 DIAGNOSIS — J9611 Chronic respiratory failure with hypoxia: Secondary | ICD-10-CM | POA: Diagnosis not present

## 2020-07-23 NOTE — Progress Notes (Signed)
Remote pacemaker transmission.   

## 2020-07-23 NOTE — Patient Instructions (Addendum)
Make sure you check your oxygen saturations at highest level of activity to be sure it stays over 90% and adjust upward to maintain this level if needed but remember to turn it back to previous settings when you stop (to conserve your supply).    Please schedule a follow up visit in 12 months but call sooner if needed

## 2020-07-23 NOTE — Progress Notes (Signed)
Subjective:    Patient ID: Derek [name], male   DOB: 1935-10-12,    MRN: [id number]    Brief patient profile:  82 yowm quit smoking 2001 "smoker's cough" and wt 210  resolved then started cough again 2008/9 assoc doe > rx  spiriva  helped  But cough continued/ worse in winter then in [place] 03/11/16 admit with pna/copd> back to baseline but referred to pulmonary clinic 04/02/2016 by Dr Wilson Singer ? Copd severity> proved to have GOLD II criteria 04/17/2016     History of Present Illness  04/02/2016 1st [facility] office visit/ Terral Cooks   Chief Complaint  Patient presents with  . Pulmonary Consult    Referred by Dr. Wilson Singer. Pt states 2 wks ago while in [place], he was hospitalized x 5 days for PNA and COPD exacerbation.  He states that today her breathing is "excellent". He has o2 at home but has not needed to use this.   has proair but not using while on spiriva dpi   Has 02 but not using  Off pred x one week s relapse - legs swelled on pred, some better off but not resolved  Not limited by breathing from desired activities  But not aerobically active = MMRC1 = can walk nl pace, flat grade, can't hurry or go uphills or steps s sob rec No change   except ok to take furosemide 20 mg one daily if needed for swelling   Plan A = Automatic = Spiriva one daily each am Plan B = Backup Only use your albuterol(proair) as rescue    01/31/2018  f/u ov/Dionis Autry re: gold ii/ no longer using 02  Chief Complaint  Patient presents with  . Follow-up    morning productive cough gray no blood present, no CP, SOB, or Wheezing. Hoarsness and increased mucus production when goes long periods of no speaking.   Dyspnea:  Gradually making progress = MMRC1 = can walk nl pace, flat grade, can't hurry or go uphills or steps s sob  Cough:dry and  assoc with voice raspy on dpi spiriva (va doesn't have smi on formulary) Sleep: ok s 02 / mostly on side rec Try off spiriva for now and see if you don't do just as well with  exercise and less irritation of your throat If worse > spiriva respimat (different device requires coaching) would be best choice      08/01/2018  f/u ov/Felesha Moncrieffe re:  GOLD II/ breathing better, cough worse  Chief Complaint  Patient presents with  . Follow-up    Breathing is doing well. He no longer has a rescue inhaler.   Dyspnea:  MMRC1 = can walk nl pace, flat grade, can't hurry or go uphills or steps s sob / L leg pain stops first  Cough: more daytime than night/  freq throat clearing/ worse in hot humid weather  X 2 year pattern   Sleeping: on either side/ not on 2  SABA use: not suing  02: has but not using   L lateral cp with cough x 6 m present daily  rec Pantoprazole (protonix) 40 mg   Take  30-60 min before first meal of the day and Pepcid (famotidine)  20 mg one @  bedtime GERD diet  Prednisone 10 mg take  4 each am x 2 days,   2 each am x 2 days,  1 each am x 2 days and stop  Change symbicort to 80 Take 2 puffs first thing in am and  then another 2 puffs about 12 hours later.  Please remember to go to the  x-ray department downstairs in the basement  for your tests - we will call you with the results when they are available. Please schedule a follow up office visit in 4 weeks   08/29/2018  f/u ov/Terilyn Sano re:  COPD II / cough still same esp out in heat / humidity  Chief Complaint  Patient presents with  . Follow-up    Breathing is unchanged.   Dyspnea:  L leg limiting > sob  Cough: on heat/humidity exp Sleeping: ok at around 40 degrees  SABA use: none 02: has not using Rec Continue symbicort 80 Take 2 puffs first thing in am and then another 2 puffs about 12 hours later.  Work on G[employer]er:    Try off protonix (pantoprazole) and pecid (famotidine)    11/28/2019  f/u ov/Mills Mitton re: COPD II on symb 80 2bid / pepcid 20 mg hs  Chief Complaint  Patient presents with  . Follow-up    Breathing is unchanged since the last visit and no new co's.   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 Assoc L hemiparesis resolved  Chief Complaint  Patient presents with  . Follow-up    6 month f/u for COPD. States his breathing has been stable since last visit. Wants to discuss going to back to Symbicort.   Dyspnea:  Shower/ 50 ft walking but not wearing 02  Or checking sats / off maint rx x Jan 2021  Cough:worse since stopped advair at least Jan 2021 but minimal grey mucus production  Sleeping: on side / bed is flat one pillow  SABA use: none 02: very rarely  rec Drug formulary from your insurance company :  dulera 100 is same as symbicort 80 if not then advair 44 should do either 2 puffs every 12 hours  Make sure you check your oxygen saturations   Stiolto 2 puffs every morning   - any side effects just reduce to once a day    07/23/2020  f/u ov/Denece Shearer re:  Copd II/ improved on stiolto 2 puffs each am  Chief Complaint  Patient presents with  . Follow-up    Breathing is doing well and no new co's.   Dyspnea:  Not limited by breathing from desired activities  But by knee/ hip L  Cough: none Sleeping: on side / bed is flat/ one pillow  SABA use: none 02: prn helps to use before shower / not checking    No obvious day to day or daytime variability or assoc excess/ purulent sputum or mucus plugs or hemoptysis or cp or chest tightness, subjective wheeze or overt sinus or hb symptoms.   Sleeping  without nocturnal  or early am exacerbation  of respiratory  c/o's or need for noct saba. Also denies any obvious fluctuation of symptoms with weather or environmental  changes or other aggravating or alleviating factors except as outlined above  No unusual exposure hx or h/o childhood pna/ asthma or knowledge of premature birth.  Current Allergies, Complete Past Medical History, Past Surgical History, Family History, and Social History were reviewed in [employer] record.  ROS  The following are not active complaints unless bolded Hoarseness, sore throat, dysphagia, dental problems, itching, sneezing,  nasal congestion or discharge of excess mucus or purulent secretions, ear ache,   fever, chills, sweats, unintended wt loss or wt gain, classically pleuritic or exertional cp,  orthopnea pnd or arm/hand swelling  or leg swelling, presyncope, palpitations, abdominal pain, anorexia, nausea, vomiting, diarrhea  or change in bowel habits or change in bladder habits, change in stools or change in urine, dysuria, hematuria,  rash, arthralgias, visual complaints, headache, numbness, weakness or ataxia or problems with walking or coordination,  change in mood or  memory.        Current Meds  Medication Sig  . acetaminophen (TYLENOL) 500 MG tablet Take 500 mg by mouth every 6 (six) hours as needed for moderate pain or headache.  . allopurinol (ZYLOPRIM) 100 MG tablet Take 1 tablet by mouth 2 (two) times a day.  Marland Kitchen apixaban (ELIQUIS) 2.5 MG TABS tablet Take 2.5 mg by mouth daily.   Marland Kitchen aspirin EC 81 MG tablet Take 81 mg by mouth daily after supper.  Marland Kitchen atorvastatin (LIPITOR) 40 MG tablet Take 40 mg by mouth daily.  . Biotin 5000 MCG TABS Take 5,000 mcg by mouth daily.  Marland Kitchen ezetimibe (ZETIA) 10 MG tablet Take 10 mg by mouth daily after supper.   . famotidine (PEPCID) 20 MG tablet One at bedtime (Patient taking differently: Take 20 mg by mouth at bedtime. One at bedtime)  . fluticasone (FLONASE) 50 MCG/ACT nasal spray Place 1 spray into both nostrils daily as needed for allergies or rhinitis.  . furosemide (LASIX) 20 MG tablet TAKE 1 TABLET DAILY AS NEEDED  FOR SWELLING (Patient taking differently: Take 20 mg by mouth daily as needed for fluid. )  . levothyroxine (SYNTHROID, LEVOTHROID) 88 MCG tablet Take 88 mcg by mouth daily before breakfast.  . Magnesium 250 MG TABS Take 250 mg by mouth daily.  . Multiple Vitamin (MULTIVITAMIN WITH MINERALS) TABS tablet Take 1 tablet by mouth daily.  . Omega-3 Fatty Acids (FISH OIL) 1000 MG CAPS Take 1,000 mg by mouth daily.   . OXYGEN Inhale 2 L into the lungs as needed (for  shortness of breath with exertion only).   . pregabalin (LYRICA) 50 MG capsule Take 50 mg by mouth daily.   Marland Kitchen terazosin (HYTRIN) 5 MG capsule Take 10 mg by mouth 2 (two) times daily.  . Tiotropium Bromide-Olodaterol (STIOLTO RESPIMAT) 2.5-2.5 MCG/ACT AERS Inhale 2 puffs into the lungs daily.  . Turmeric 500 MG CAPS Take 500 mg by mouth daily.                    Objective:   Physical Exam  amb mod obese pleasant wm/ less rattling cough   Vital signs reviewed  07/23/2020  - Note at rest 02 sats  98% on RA       07/23/2020          205 05/28/2020          202  11/28/2019        203  08/29/2018          190  08/01/2018        192    01/31/2018  195  10/27/2017        203  07/27/2017          221  04/26/2017          226 02/17/2017        231  10/27/2016        234  07/27/2016          230  04/17/2016        232  04/02/16 239 lb 3.4 oz (108.505 kg)  11/06/15 226 lb (102.513 kg)  04/02/15 236 lb (107.049 kg)        reports Edentulous      HEENT : pt wearing mask not removed for exam due to covid - 19 concerns.    NECK :  without JVD/Nodes/TM/ nl carotid upstrokes bilaterally   LUNGS: no acc muscle use,  Mild barrel  contour chest wall with bilateral  Distant bs s audible wheeze and  without cough on insp or exp maneuvers  and mild  Hyperresonant  to  percussion bilaterally     CV:  RRR  no s3 or murmur or increase in P2, and trace to 1+ pitting both LE's  ABD: obese  soft and nontender with pos end  insp Hoover's  in the  supine position. No bruits or organomegaly appreciated, bowel sounds nl  MS:   Nl gait/  ext warm without deformities, calf tenderness, cyanosis or clubbing No obvious joint restrictions   SKIN: warm and dry without lesions    NEURO:  alert, approp, nl sensorium with  no motor or cerebellar deficits apparent.       .       Assessment:

## 2020-07-23 NOTE — Assessment & Plan Note (Signed)
Placed 02 in [place] in May 2017  =  2lpm 24/7 but using prn instead  -   02/17/2017  Patient Saturations on Room Air at Rest = 95% Patient Saturations on Room Air while Ambulating = 88% p one lap = 185 ft mod pace Saturations on 2 Liters of oxygen while Ambulating = 95% - Patient 11/28/2019   Walked RA x one lap =  approx 250 ft -@ avg pace stopped due to hip pain, no sob/ sats 98%   Ok to use 02 prn but should check sats at highest level of activity          Each maintenance medication was reviewed in detail including emphasizing most importantly the difference between maintenance and prns and under what circumstances the prns are to be triggered using an action plan format where appropriate.  Total time for H and P, chart review, counseling, teaching devices (portable 02/ respimat )  and generating customized AVS unique to this office visit / charting = 20 min

## 2020-07-23 NOTE — Assessment & Plan Note (Signed)
Quit smoking 2001  - PFT's  04/17/2016  FEV1 2.00 (72 % ) ratio 60  p no % improvement from saba p no prior to study with DLCO  38 % corrects to 50 % for alv volume   - 7//2017 worse on spiriva monotherapy > added advair 100 > ov 07/27/2016 changed to advair 250 - 07/27/2017  change advair to symb 160 2bid due to hoarseness / continue spiriva dpi as gets it free from [place] - 01/31/2018 try off spiriva dpi due to throat irritation  - 08/01/2018 try lower symb to 80 2bid due to cough/ throat clearing   - 05/28/2020  After extensive coaching inhaler device,  effectiveness =    90% with smi >try stiolto 2 each am > improved doe 07/23/2020   Pt is Group B in terms of symptom/risk and laba/lama therefore appropriate rx at this point >>>  Continue stiolto or formulary directed lama/laba  Advised:  formulary restrictions will be an ongoing challenge for the forseable future and I would be happy to pick an alternative if the pt will first  provide me a list of them -  pt  will need to return here for training for any new device that is required eg dpi vs hfa vs respimat.    In the meantime we can always provide samples so that the patient never runs out of any needed respiratory medications.

## 2020-08-13 ENCOUNTER — Telehealth (HOSPITAL_COMMUNITY): Payer: Self-pay

## 2020-08-13 NOTE — Telephone Encounter (Signed)
Returned pt's call, informed him that he isn't due for a f/u US carotid until October. Will call the end of September to schedule. Pt agreed with this plan. AW

## 2020-09-11 DIAGNOSIS — I4821 Permanent atrial fibrillation: Secondary | ICD-10-CM | POA: Diagnosis not present

## 2020-09-11 DIAGNOSIS — I700 Atherosclerosis of aorta: Secondary | ICD-10-CM | POA: Diagnosis not present

## 2020-09-11 DIAGNOSIS — I63411 Cerebral infarction due to embolism of right middle cerebral artery: Secondary | ICD-10-CM | POA: Diagnosis not present

## 2020-09-11 DIAGNOSIS — N184 Chronic kidney disease, stage 4 (severe): Secondary | ICD-10-CM | POA: Diagnosis not present

## 2020-09-11 DIAGNOSIS — D696 Thrombocytopenia, unspecified: Secondary | ICD-10-CM | POA: Diagnosis not present

## 2020-09-11 DIAGNOSIS — J449 Chronic obstructive pulmonary disease, unspecified: Secondary | ICD-10-CM | POA: Diagnosis not present

## 2020-09-18 DIAGNOSIS — D696 Thrombocytopenia, unspecified: Secondary | ICD-10-CM | POA: Diagnosis not present

## 2020-09-18 DIAGNOSIS — I63411 Cerebral infarction due to embolism of right middle cerebral artery: Secondary | ICD-10-CM | POA: Diagnosis not present

## 2020-09-18 DIAGNOSIS — N184 Chronic kidney disease, stage 4 (severe): Secondary | ICD-10-CM | POA: Diagnosis not present

## 2020-09-18 DIAGNOSIS — E039 Hypothyroidism, unspecified: Secondary | ICD-10-CM | POA: Diagnosis not present

## 2020-09-18 DIAGNOSIS — J449 Chronic obstructive pulmonary disease, unspecified: Secondary | ICD-10-CM | POA: Diagnosis not present

## 2020-09-18 DIAGNOSIS — I700 Atherosclerosis of aorta: Secondary | ICD-10-CM | POA: Diagnosis not present

## 2020-09-18 DIAGNOSIS — I6529 Occlusion and stenosis of unspecified carotid artery: Secondary | ICD-10-CM | POA: Diagnosis not present

## 2020-09-18 DIAGNOSIS — I4821 Permanent atrial fibrillation: Secondary | ICD-10-CM | POA: Diagnosis not present

## 2020-09-18 DIAGNOSIS — I129 Hypertensive chronic kidney disease with stage 1 through stage 4 chronic kidney disease, or unspecified chronic kidney disease: Secondary | ICD-10-CM | POA: Diagnosis not present

## 2020-09-18 DIAGNOSIS — I2510 Atherosclerotic heart disease of native coronary artery without angina pectoris: Secondary | ICD-10-CM | POA: Diagnosis not present

## 2020-09-18 DIAGNOSIS — E782 Mixed hyperlipidemia: Secondary | ICD-10-CM | POA: Diagnosis not present

## 2020-10-15 ENCOUNTER — Other Ambulatory Visit: Payer: Self-pay | Admitting: Internal Medicine

## 2020-10-15 NOTE — Telephone Encounter (Addendum)
Pt has is requesting refill on FUROSEMIDE TABS 20MG  next ov 07/25/20. Pt was informed [facility]

## 2020-10-16 DIAGNOSIS — W19XXXA Unspecified fall, initial encounter: Secondary | ICD-10-CM | POA: Diagnosis not present

## 2020-10-16 DIAGNOSIS — S8992XA Unspecified injury of left lower leg, initial encounter: Secondary | ICD-10-CM | POA: Diagnosis not present

## 2020-10-16 DIAGNOSIS — S6992XA Unspecified injury of left wrist, hand and finger(s), initial encounter: Secondary | ICD-10-CM | POA: Diagnosis not present

## 2020-10-16 DIAGNOSIS — M25562 Pain in left knee: Secondary | ICD-10-CM | POA: Diagnosis not present

## 2020-10-16 DIAGNOSIS — S0083XA Contusion of other part of head, initial encounter: Secondary | ICD-10-CM | POA: Diagnosis not present

## 2020-10-16 DIAGNOSIS — M25532 Pain in left wrist: Secondary | ICD-10-CM | POA: Diagnosis not present

## 2020-10-16 DIAGNOSIS — R0781 Pleurodynia: Secondary | ICD-10-CM | POA: Diagnosis not present

## 2020-10-16 DIAGNOSIS — Z043 Encounter for examination and observation following other accident: Secondary | ICD-10-CM | POA: Diagnosis not present

## 2020-10-16 DIAGNOSIS — S6991XA Unspecified injury of right wrist, hand and finger(s), initial encounter: Secondary | ICD-10-CM | POA: Diagnosis not present

## 2020-10-18 ENCOUNTER — Ambulatory Visit (INDEPENDENT_AMBULATORY_CARE_PROVIDER_SITE_OTHER): Payer: Medicare Other

## 2020-10-18 DIAGNOSIS — R001 Bradycardia, unspecified: Secondary | ICD-10-CM | POA: Diagnosis not present

## 2020-10-21 DIAGNOSIS — S0083XA Contusion of other part of head, initial encounter: Secondary | ICD-10-CM | POA: Diagnosis not present

## 2020-10-21 DIAGNOSIS — M898X1 Other specified disorders of bone, shoulder: Secondary | ICD-10-CM | POA: Diagnosis not present

## 2020-10-21 DIAGNOSIS — M25539 Pain in unspecified wrist: Secondary | ICD-10-CM | POA: Diagnosis not present

## 2020-10-21 LAB — CUP PACEART REMOTE DEVICE CHECK
Battery Remaining Longevity: 105 mo
Battery Voltage: 3 V
Brady Statistic AP VP Percent: 0 %
Brady Statistic AP VS Percent: 0 %
Brady Statistic AS VP Percent: 99.79 %
Brady Statistic AS VS Percent: 0.21 %
Brady Statistic RA Percent Paced: 0 %
Brady Statistic RV Percent Paced: 99.79 %
Date Time Interrogation Session: 20211029222653
Implantable Lead Implant Date: 20181015
Implantable Lead Implant Date: 20181015
Implantable Lead Location: 753859
Implantable Lead Location: 753860
Implantable Lead Model: 5076
Implantable Lead Model: 5076
Implantable Pulse Generator Implant Date: 20181015
Lead Channel Impedance Value: 266 Ohm
Lead Channel Impedance Value: 380 Ohm
Lead Channel Impedance Value: 380 Ohm
Lead Channel Impedance Value: 456 Ohm
Lead Channel Pacing Threshold Amplitude: 0.5 V
Lead Channel Pacing Threshold Pulse Width: 0.4 ms
Lead Channel Sensing Intrinsic Amplitude: 0.625 mV
Lead Channel Sensing Intrinsic Amplitude: 0.875 mV
Lead Channel Sensing Intrinsic Amplitude: 10.375 mV
Lead Channel Sensing Intrinsic Amplitude: 10.375 mV
Lead Channel Setting Pacing Amplitude: 2.5 V
Lead Channel Setting Pacing Pulse Width: 0.4 ms
Lead Channel Setting Sensing Sensitivity: 1.2 mV

## 2020-10-22 NOTE — Progress Notes (Signed)
Remote pacemaker transmission.   

## 2020-11-04 DIAGNOSIS — M25539 Pain in unspecified wrist: Secondary | ICD-10-CM | POA: Diagnosis not present

## 2020-11-04 DIAGNOSIS — S0083XA Contusion of other part of head, initial encounter: Secondary | ICD-10-CM | POA: Diagnosis not present

## 2020-11-04 DIAGNOSIS — M898X1 Other specified disorders of bone, shoulder: Secondary | ICD-10-CM | POA: Diagnosis not present

## 2020-11-18 ENCOUNTER — Telehealth: Payer: Self-pay | Admitting: Internal Medicine

## 2020-11-18 NOTE — Telephone Encounter (Signed)
Patient would like to be called by cell phone number is [phone].

## 2020-11-18 NOTE — Telephone Encounter (Signed)
ATC patient on cell phone and went to voicemail

## 2020-11-18 NOTE — Telephone Encounter (Signed)
ATC patient asked for a remote access code , I do not have one of those.

## 2020-11-19 ENCOUNTER — Emergency Department (HOSPITAL_COMMUNITY): Payer: Medicare Other

## 2020-11-19 ENCOUNTER — Encounter (HOSPITAL_COMMUNITY): Payer: Self-pay | Admitting: Emergency Medicine

## 2020-11-19 ENCOUNTER — Inpatient Hospital Stay (HOSPITAL_COMMUNITY)
Admission: EM | Admit: 2020-11-19 | Discharge: 2020-11-25 | DRG: 193 | Disposition: A | Discharge status: Hospice | Payer: Medicare Other | Attending: Internal Medicine | Admitting: Internal Medicine

## 2020-11-19 ENCOUNTER — Other Ambulatory Visit: Payer: Self-pay

## 2020-11-19 DIAGNOSIS — E785 Hyperlipidemia, unspecified: Secondary | ICD-10-CM | POA: Diagnosis present

## 2020-11-19 DIAGNOSIS — R946 Abnormal results of thyroid function studies: Secondary | ICD-10-CM | POA: Diagnosis present

## 2020-11-19 DIAGNOSIS — Z515 Encounter for palliative care: Secondary | ICD-10-CM

## 2020-11-19 DIAGNOSIS — Z8673 Personal history of transient ischemic attack (TIA), and cerebral infarction without residual deficits: Secondary | ICD-10-CM

## 2020-11-19 DIAGNOSIS — R4020 Unspecified coma: Secondary | ICD-10-CM | POA: Diagnosis not present

## 2020-11-19 DIAGNOSIS — Z87891 Personal history of nicotine dependence: Secondary | ICD-10-CM

## 2020-11-19 DIAGNOSIS — J440 Chronic obstructive pulmonary disease with acute lower respiratory infection: Secondary | ICD-10-CM | POA: Diagnosis not present

## 2020-11-19 DIAGNOSIS — F0150 Vascular dementia without behavioral disturbance: Secondary | ICD-10-CM

## 2020-11-19 DIAGNOSIS — R1310 Dysphagia, unspecified: Secondary | ICD-10-CM

## 2020-11-19 DIAGNOSIS — I6521 Occlusion and stenosis of right carotid artery: Secondary | ICD-10-CM | POA: Diagnosis present

## 2020-11-19 DIAGNOSIS — Z20822 Contact with and (suspected) exposure to covid-19: Secondary | ICD-10-CM | POA: Diagnosis not present

## 2020-11-19 DIAGNOSIS — J449 Chronic obstructive pulmonary disease, unspecified: Secondary | ICD-10-CM | POA: Diagnosis present

## 2020-11-19 DIAGNOSIS — F0391 Unspecified dementia with behavioral disturbance: Secondary | ICD-10-CM

## 2020-11-19 DIAGNOSIS — Z79899 Other long term (current) drug therapy: Secondary | ICD-10-CM

## 2020-11-19 DIAGNOSIS — J9611 Chronic respiratory failure with hypoxia: Secondary | ICD-10-CM | POA: Diagnosis present

## 2020-11-19 DIAGNOSIS — I672 Cerebral atherosclerosis: Secondary | ICD-10-CM | POA: Diagnosis not present

## 2020-11-19 DIAGNOSIS — Z9842 Cataract extraction status, left eye: Secondary | ICD-10-CM

## 2020-11-19 DIAGNOSIS — I25810 Atherosclerosis of coronary artery bypass graft(s) without angina pectoris: Secondary | ICD-10-CM | POA: Diagnosis present

## 2020-11-19 DIAGNOSIS — R410 Disorientation, unspecified: Secondary | ICD-10-CM

## 2020-11-19 DIAGNOSIS — R443 Hallucinations, unspecified: Secondary | ICD-10-CM | POA: Diagnosis present

## 2020-11-19 DIAGNOSIS — I4819 Other persistent atrial fibrillation: Secondary | ICD-10-CM | POA: Diagnosis present

## 2020-11-19 DIAGNOSIS — E870 Hyperosmolality and hypernatremia: Secondary | ICD-10-CM

## 2020-11-19 DIAGNOSIS — J189 Pneumonia, unspecified organism: Secondary | ICD-10-CM | POA: Diagnosis not present

## 2020-11-19 DIAGNOSIS — E876 Hypokalemia: Secondary | ICD-10-CM

## 2020-11-19 DIAGNOSIS — I252 Old myocardial infarction: Secondary | ICD-10-CM

## 2020-11-19 DIAGNOSIS — I447 Left bundle-branch block, unspecified: Secondary | ICD-10-CM | POA: Diagnosis present

## 2020-11-19 DIAGNOSIS — Z7901 Long term (current) use of anticoagulants: Secondary | ICD-10-CM

## 2020-11-19 DIAGNOSIS — R451 Restlessness and agitation: Secondary | ICD-10-CM | POA: Diagnosis present

## 2020-11-19 DIAGNOSIS — Z9841 Cataract extraction status, right eye: Secondary | ICD-10-CM

## 2020-11-19 DIAGNOSIS — I708 Atherosclerosis of other arteries: Secondary | ICD-10-CM | POA: Diagnosis not present

## 2020-11-19 DIAGNOSIS — R0902 Hypoxemia: Secondary | ICD-10-CM | POA: Diagnosis not present

## 2020-11-19 DIAGNOSIS — Z66 Do not resuscitate: Secondary | ICD-10-CM | POA: Diagnosis present

## 2020-11-19 DIAGNOSIS — Z882 Allergy status to sulfonamides status: Secondary | ICD-10-CM

## 2020-11-19 DIAGNOSIS — I6529 Occlusion and stenosis of unspecified carotid artery: Secondary | ICD-10-CM | POA: Diagnosis present

## 2020-11-19 DIAGNOSIS — I10 Essential (primary) hypertension: Secondary | ICD-10-CM | POA: Diagnosis not present

## 2020-11-19 DIAGNOSIS — Z83438 Family history of other disorder of lipoprotein metabolism and other lipidemia: Secondary | ICD-10-CM

## 2020-11-19 DIAGNOSIS — E039 Hypothyroidism, unspecified: Secondary | ICD-10-CM | POA: Diagnosis present

## 2020-11-19 DIAGNOSIS — Z833 Family history of diabetes mellitus: Secondary | ICD-10-CM

## 2020-11-19 DIAGNOSIS — R404 Transient alteration of awareness: Secondary | ICD-10-CM | POA: Diagnosis not present

## 2020-11-19 DIAGNOSIS — I714 Abdominal aortic aneurysm, without rupture: Secondary | ICD-10-CM | POA: Diagnosis present

## 2020-11-19 DIAGNOSIS — N1832 Chronic kidney disease, stage 3b: Secondary | ICD-10-CM | POA: Diagnosis present

## 2020-11-19 DIAGNOSIS — I160 Hypertensive urgency: Secondary | ICD-10-CM

## 2020-11-19 DIAGNOSIS — I2510 Atherosclerotic heart disease of native coronary artery without angina pectoris: Secondary | ICD-10-CM | POA: Diagnosis present

## 2020-11-19 DIAGNOSIS — Z9181 History of falling: Secondary | ICD-10-CM

## 2020-11-19 DIAGNOSIS — D696 Thrombocytopenia, unspecified: Secondary | ICD-10-CM | POA: Diagnosis present

## 2020-11-19 DIAGNOSIS — Z8249 Family history of ischemic heart disease and other diseases of the circulatory system: Secondary | ICD-10-CM

## 2020-11-19 DIAGNOSIS — N183 Chronic kidney disease, stage 3 unspecified: Secondary | ICD-10-CM | POA: Diagnosis present

## 2020-11-19 DIAGNOSIS — I129 Hypertensive chronic kidney disease with stage 1 through stage 4 chronic kidney disease, or unspecified chronic kidney disease: Secondary | ICD-10-CM | POA: Diagnosis present

## 2020-11-19 DIAGNOSIS — Z91040 Latex allergy status: Secondary | ICD-10-CM

## 2020-11-19 DIAGNOSIS — N179 Acute kidney failure, unspecified: Secondary | ICD-10-CM

## 2020-11-19 DIAGNOSIS — G9341 Metabolic encephalopathy: Secondary | ICD-10-CM | POA: Diagnosis not present

## 2020-11-19 DIAGNOSIS — Z950 Presence of cardiac pacemaker: Secondary | ICD-10-CM

## 2020-11-19 DIAGNOSIS — R0602 Shortness of breath: Secondary | ICD-10-CM | POA: Diagnosis not present

## 2020-11-19 DIAGNOSIS — Z7982 Long term (current) use of aspirin: Secondary | ICD-10-CM

## 2020-11-19 DIAGNOSIS — J441 Chronic obstructive pulmonary disease with (acute) exacerbation: Secondary | ICD-10-CM | POA: Diagnosis present

## 2020-11-19 DIAGNOSIS — I480 Paroxysmal atrial fibrillation: Secondary | ICD-10-CM | POA: Diagnosis present

## 2020-11-19 DIAGNOSIS — Z9981 Dependence on supplemental oxygen: Secondary | ICD-10-CM

## 2020-11-19 DIAGNOSIS — R9431 Abnormal electrocardiogram [ECG] [EKG]: Secondary | ICD-10-CM | POA: Diagnosis present

## 2020-11-19 DIAGNOSIS — J188 Other pneumonia, unspecified organism: Secondary | ICD-10-CM | POA: Diagnosis not present

## 2020-11-19 DIAGNOSIS — R0682 Tachypnea, not elsewhere classified: Secondary | ICD-10-CM | POA: Diagnosis not present

## 2020-11-19 DIAGNOSIS — Z79890 Hormone replacement therapy (postmenopausal): Secondary | ICD-10-CM

## 2020-11-19 DIAGNOSIS — Z7189 Other specified counseling: Secondary | ICD-10-CM

## 2020-11-19 LAB — COMPREHENSIVE METABOLIC PANEL
ALT: 27 U/L (ref 0–44)
AST: 36 U/L (ref 15–41)
Albumin: 3.1 g/dL — ABNORMAL LOW (ref 3.5–5.0)
Alkaline Phosphatase: 87 U/L (ref 38–126)
Anion gap: 12 (ref 5–15)
BUN: 29 mg/dL — ABNORMAL HIGH (ref 8–23)
CO2: 24 mmol/L (ref 22–32)
Calcium: 9.2 mg/dL (ref 8.9–10.3)
Chloride: 108 mmol/L (ref 98–111)
Creatinine, Ser: 1.74 mg/dL — ABNORMAL HIGH (ref 0.61–1.24)
GFR, Estimated: 38 mL/min — ABNORMAL LOW (ref >60)
Glucose, Bld: 137 mg/dL — ABNORMAL HIGH (ref 70–99)
Potassium: 4 mmol/L (ref 3.5–5.1)
Sodium: 144 mmol/L (ref 135–145)
Total Bilirubin: 1.5 mg/dL — ABNORMAL HIGH (ref 0.3–1.2)
Total Protein: 5.9 g/dL — ABNORMAL LOW (ref 6.5–8.1)

## 2020-11-19 LAB — CBC
HCT: 43.5 % (ref 39.0–52.0)
Hemoglobin: 14.5 g/dL (ref 13.0–17.0)
MCH: 32.5 pg (ref 26.0–34.0)
MCHC: 33.3 g/dL (ref 30.0–36.0)
MCV: 97.5 fL (ref 80.0–100.0)
Platelets: 127 10*3/uL — ABNORMAL LOW (ref 150–400)
RBC: 4.46 MIL/uL (ref 4.22–5.81)
RDW: 14.3 % (ref 11.5–15.5)
WBC: 7.2 10*3/uL (ref 4.0–10.5)
nRBC: 0 % (ref 0.0–0.2)

## 2020-11-19 LAB — ETHANOL: Alcohol, Ethyl (B): <10 mg/dL (ref <10)

## 2020-11-19 LAB — CBG MONITORING, ED
Glucose-Capillary: 126 mg/dL — ABNORMAL HIGH (ref 70–99)
Glucose-Capillary: 116 mg/dL — ABNORMAL HIGH (ref 70–99)

## 2020-11-19 LAB — LACTIC ACID, PLASMA: Lactic Acid, Venous: 1.3 mmol/L (ref 0.5–1.9)

## 2020-11-19 LAB — PROTIME-INR
INR: 1.1 (ref 0.8–1.2)
Prothrombin Time: 13.4 seconds (ref 11.4–15.2)

## 2020-11-19 MED ORDER — ATORVASTATIN CALCIUM 40 MG PO TABS
40.0000 mg | ORAL_TABLET | Freq: Every day | ORAL | Status: DC
  Administered 2020-11-20 – 2020-11-22 (×2): 40 mg via ORAL
  Filled 2020-11-19 (×3): qty 1

## 2020-11-19 MED ORDER — ACETAMINOPHEN 500 MG PO TABS
500.0000 mg | ORAL_TABLET | Freq: Four times a day (QID) | ORAL | Status: DC | PRN
  Administered 2020-11-20 – 2020-11-22 (×2): 500 mg via ORAL
  Filled 2020-11-19 (×2): qty 1

## 2020-11-19 MED ORDER — PREGABALIN 50 MG PO CAPS
50.0000 mg | ORAL_CAPSULE | Freq: Every day | ORAL | Status: DC
  Administered 2020-11-20 – 2020-11-22 (×2): 50 mg via ORAL
  Filled 2020-11-19 (×3): qty 1

## 2020-11-19 MED ORDER — LEVOTHYROXINE SODIUM 88 MCG PO TABS
88.0000 ug | ORAL_TABLET | Freq: Every day | ORAL | Status: DC
  Administered 2020-11-20 – 2020-11-22 (×2): 88 ug via ORAL
  Filled 2020-11-19 (×4): qty 1

## 2020-11-19 MED ORDER — LORAZEPAM 2 MG/ML IJ SOLN
0.5000 mg | Freq: Once | INTRAMUSCULAR | Status: AC
  Administered 2020-11-19: 0.5 mg via INTRAVENOUS
  Filled 2020-11-19: qty 1

## 2020-11-19 MED ORDER — LORAZEPAM 1 MG PO TABS
1.0000 mg | ORAL_TABLET | ORAL | Status: DC | PRN
  Administered 2020-11-19 – 2020-11-20 (×2): 1 mg via ORAL
  Filled 2020-11-19 (×2): qty 1

## 2020-11-19 MED ORDER — APIXABAN 2.5 MG PO TABS
2.5000 mg | ORAL_TABLET | Freq: Every day | ORAL | Status: DC
  Administered 2020-11-20 – 2020-11-22 (×2): 2.5 mg via ORAL
  Filled 2020-11-19 (×4): qty 1
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
## 2020-11-19 NOTE — ED Notes (Signed)
Pt transported to CT ?

## 2020-11-19 NOTE — ED Provider Notes (Signed)
  Physical Exam  BP (!) 164/79 (BP Location: Right Arm)   Pulse 79   Temp (P) 98.4 F (36.9 C) (Oral)   Resp 18   Ht (P) 5\' 9"  (1.753 m)   Wt (P) 92.1 kg   SpO2 95%   BMI (P) 29.98 kg/m   Physical Exam  ED Course/Procedures     Procedures  MDM  Care assumed at 4 pm. Patient altered. Signout pending CT head psych consult  10:06 PM CT did not show any acute findings.  Patient was agitated requiring Ativan.  Medically cleared for psych eval.  Home meds ordered    Drenda Freeze, MD 11/19/20 2206
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
## 2020-11-19 NOTE — ED Notes (Signed)
Pt more altered and agitated, won't keep nasal cannula or monitoring devices on; will not answer questions, repeating "go away"; Dr. Darl Householder notified
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
## 2020-11-20 LAB — RESP PANEL BY RT-PCR (FLU A&B, COVID) ARPGX2
Influenza A by PCR: NEGATIVE
Influenza B by PCR: NEGATIVE
SARS Coronavirus 2 by RT PCR: NEGATIVE

## 2020-11-20 LAB — CBG MONITORING, ED: Glucose-Capillary: 107 mg/dL — ABNORMAL HIGH (ref 70–99)

## 2020-11-20 MED ORDER — LORAZEPAM 2 MG/ML IJ SOLN
1.0000 mg | Freq: Once | INTRAMUSCULAR | Status: AC
  Administered 2020-11-20: 1 mg via INTRAMUSCULAR
  Filled 2020-11-20: qty 1

## 2020-11-20 MED ORDER — ALBUTEROL SULFATE HFA 108 (90 BASE) MCG/ACT IN AERS
2.0000 | INHALATION_SPRAY | Freq: Once | RESPIRATORY_TRACT | Status: AC
  Administered 2020-11-20: 2 via RESPIRATORY_TRACT
  Filled 2020-11-20: qty 6.7

## 2020-11-20 NOTE — ED Notes (Signed)
PA aware of retained urine of 350.  He stated since pt appears comfortable, will re-assess later if pt appears uncomfortable.

## 2020-11-20 NOTE — ED Notes (Signed)
RN spoke with pts daughter from [place]. Daughter requested she would like updates at [phone]

## 2020-11-20 NOTE — ED Notes (Signed)
Patient at whole container of apple sauce and a few sips of water; Patient seem to be in pain so PRN med administered-Monique,RN

## 2020-11-20 NOTE — ED Notes (Signed)
Pt O2 sats improved after 2L oxygen placed.  Pt tolerating well.  Expiratory wheezing noted.

## 2020-11-20 NOTE — Progress Notes (Signed)
Pt meets inpatient criteria per Lindon Romp, FNP. Referral information has been sent to the following geriatric psychiatric hospitals for review:  [facility]  [facility]     Disposition will continue to follow for inpatient placement needs.    Audree Camel, MSW, LCSW, [place] Clinical Social Worker II Disposition CSW [phone]

## 2020-11-20 NOTE — ED Notes (Signed)
Placed pt's girlfriend on speaker.  Pt responded to pt's voice.  Girlfriend said, "I love you" and pt responded "I love you".  Girlfriend states that she was unaware that the pt had dementia.  He would "forget things" like if it was [place] or Tues, but otherwise she felt was was completely normal.

## 2020-11-20 NOTE — ED Notes (Signed)
Patient walked out of room and was not able to be redirected; pt went to bathroom for a few minutes and came out walking towards the door; [employer] in unit and Security called for help; Pt was combative and attempted to swat and spit at staff; EDP and Charge notified; Emergency Med administered IM; Pt refuses to wear O2; Patient placed back in bed and bed alarm on; Patient is in sight of RN desk-Monique,RN

## 2020-11-20 NOTE — [facility] Counselor (Signed)
Attempted reassessment.  Per attending RN, Pt is too fatigued/sedated to respond at this time.

## 2020-11-21 ENCOUNTER — Emergency Department (HOSPITAL_COMMUNITY): Payer: Medicare Other

## 2020-11-21 DIAGNOSIS — E785 Hyperlipidemia, unspecified: Secondary | ICD-10-CM

## 2020-11-21 DIAGNOSIS — I4819 Other persistent atrial fibrillation: Secondary | ICD-10-CM | POA: Diagnosis present

## 2020-11-21 DIAGNOSIS — I129 Hypertensive chronic kidney disease with stage 1 through stage 4 chronic kidney disease, or unspecified chronic kidney disease: Secondary | ICD-10-CM | POA: Diagnosis present

## 2020-11-21 DIAGNOSIS — Z66 Do not resuscitate: Secondary | ICD-10-CM | POA: Diagnosis present

## 2020-11-21 DIAGNOSIS — N1832 Chronic kidney disease, stage 3b: Secondary | ICD-10-CM

## 2020-11-21 DIAGNOSIS — D696 Thrombocytopenia, unspecified: Secondary | ICD-10-CM | POA: Diagnosis present

## 2020-11-21 DIAGNOSIS — J42 Unspecified chronic bronchitis: Secondary | ICD-10-CM

## 2020-11-21 DIAGNOSIS — G9341 Metabolic encephalopathy: Secondary | ICD-10-CM | POA: Diagnosis present

## 2020-11-21 DIAGNOSIS — J440 Chronic obstructive pulmonary disease with acute lower respiratory infection: Secondary | ICD-10-CM | POA: Diagnosis present

## 2020-11-21 DIAGNOSIS — I2510 Atherosclerotic heart disease of native coronary artery without angina pectoris: Secondary | ICD-10-CM | POA: Diagnosis present

## 2020-11-21 DIAGNOSIS — I6521 Occlusion and stenosis of right carotid artery: Secondary | ICD-10-CM | POA: Diagnosis not present

## 2020-11-21 DIAGNOSIS — R443 Hallucinations, unspecified: Secondary | ICD-10-CM | POA: Diagnosis present

## 2020-11-21 DIAGNOSIS — F0150 Vascular dementia without behavioral disturbance: Secondary | ICD-10-CM | POA: Diagnosis not present

## 2020-11-21 DIAGNOSIS — I679 Cerebrovascular disease, unspecified: Secondary | ICD-10-CM | POA: Diagnosis not present

## 2020-11-21 DIAGNOSIS — Z515 Encounter for palliative care: Secondary | ICD-10-CM | POA: Diagnosis not present

## 2020-11-21 DIAGNOSIS — R0902 Hypoxemia: Secondary | ICD-10-CM | POA: Diagnosis not present

## 2020-11-21 DIAGNOSIS — E039 Hypothyroidism, unspecified: Secondary | ICD-10-CM

## 2020-11-21 DIAGNOSIS — J441 Chronic obstructive pulmonary disease with (acute) exacerbation: Secondary | ICD-10-CM | POA: Diagnosis present

## 2020-11-21 DIAGNOSIS — I252 Old myocardial infarction: Secondary | ICD-10-CM | POA: Diagnosis not present

## 2020-11-21 DIAGNOSIS — N183 Chronic kidney disease, stage 3 unspecified: Secondary | ICD-10-CM | POA: Diagnosis present

## 2020-11-21 DIAGNOSIS — Z9842 Cataract extraction status, left eye: Secondary | ICD-10-CM | POA: Diagnosis not present

## 2020-11-21 DIAGNOSIS — E870 Hyperosmolality and hypernatremia: Secondary | ICD-10-CM | POA: Diagnosis not present

## 2020-11-21 DIAGNOSIS — I25810 Atherosclerosis of coronary artery bypass graft(s) without angina pectoris: Secondary | ICD-10-CM | POA: Diagnosis present

## 2020-11-21 DIAGNOSIS — R4020 Unspecified coma: Secondary | ICD-10-CM | POA: Diagnosis not present

## 2020-11-21 DIAGNOSIS — J189 Pneumonia, unspecified organism: Principal | ICD-10-CM

## 2020-11-21 DIAGNOSIS — I714 Abdominal aortic aneurysm, without rupture: Secondary | ICD-10-CM | POA: Diagnosis present

## 2020-11-21 DIAGNOSIS — Z7401 Bed confinement status: Secondary | ICD-10-CM | POA: Diagnosis not present

## 2020-11-21 DIAGNOSIS — I480 Paroxysmal atrial fibrillation: Secondary | ICD-10-CM

## 2020-11-21 DIAGNOSIS — Z7189 Other specified counseling: Secondary | ICD-10-CM | POA: Diagnosis not present

## 2020-11-21 DIAGNOSIS — J9611 Chronic respiratory failure with hypoxia: Secondary | ICD-10-CM | POA: Diagnosis present

## 2020-11-21 DIAGNOSIS — Z87891 Personal history of nicotine dependence: Secondary | ICD-10-CM | POA: Diagnosis not present

## 2020-11-21 DIAGNOSIS — Z951 Presence of aortocoronary bypass graft: Secondary | ICD-10-CM | POA: Diagnosis not present

## 2020-11-21 DIAGNOSIS — Z9181 History of falling: Secondary | ICD-10-CM | POA: Diagnosis not present

## 2020-11-21 DIAGNOSIS — R0682 Tachypnea, not elsewhere classified: Secondary | ICD-10-CM | POA: Diagnosis not present

## 2020-11-21 DIAGNOSIS — M2550 Pain in unspecified joint: Secondary | ICD-10-CM | POA: Diagnosis not present

## 2020-11-21 DIAGNOSIS — Z9981 Dependence on supplemental oxygen: Secondary | ICD-10-CM | POA: Diagnosis not present

## 2020-11-21 DIAGNOSIS — Z20822 Contact with and (suspected) exposure to covid-19: Secondary | ICD-10-CM | POA: Diagnosis present

## 2020-11-21 DIAGNOSIS — R0602 Shortness of breath: Secondary | ICD-10-CM | POA: Diagnosis not present

## 2020-11-21 DIAGNOSIS — Z8673 Personal history of transient ischemic attack (TIA), and cerebral infarction without residual deficits: Secondary | ICD-10-CM | POA: Diagnosis not present

## 2020-11-21 DIAGNOSIS — Z950 Presence of cardiac pacemaker: Secondary | ICD-10-CM | POA: Diagnosis not present

## 2020-11-21 DIAGNOSIS — N179 Acute kidney failure, unspecified: Secondary | ICD-10-CM | POA: Diagnosis present

## 2020-11-21 LAB — COMPREHENSIVE METABOLIC PANEL
ALT: 25 U/L (ref 0–44)
AST: 32 U/L (ref 15–41)
Albumin: 2.7 g/dL — ABNORMAL LOW (ref 3.5–5.0)
Alkaline Phosphatase: 93 U/L (ref 38–126)
Anion gap: 12 (ref 5–15)
BUN: 34 mg/dL — ABNORMAL HIGH (ref 8–23)
CO2: 22 mmol/L (ref 22–32)
Calcium: 9.1 mg/dL (ref 8.9–10.3)
Chloride: 113 mmol/L — ABNORMAL HIGH (ref 98–111)
Creatinine, Ser: 1.65 mg/dL — ABNORMAL HIGH (ref 0.61–1.24)
GFR, Estimated: 41 mL/min — ABNORMAL LOW (ref >60)
Glucose, Bld: 118 mg/dL — ABNORMAL HIGH (ref 70–99)
Potassium: 3.6 mmol/L (ref 3.5–5.1)
Sodium: 147 mmol/L — ABNORMAL HIGH (ref 135–145)
Total Bilirubin: 1.7 mg/dL — ABNORMAL HIGH (ref 0.3–1.2)
Total Protein: 5.5 g/dL — ABNORMAL LOW (ref 6.5–8.1)

## 2020-11-21 LAB — URINALYSIS, ROUTINE W REFLEX MICROSCOPIC
Bilirubin Urine: NEGATIVE
Glucose, UA: NEGATIVE mg/dL
Ketones, ur: NEGATIVE mg/dL
Leukocytes,Ua: NEGATIVE
Nitrite: NEGATIVE
Protein, ur: >=300 mg/dL — AB
Specific Gravity, Urine: 1.024 (ref 1.005–1.030)
pH: 5 (ref 5.0–8.0)

## 2020-11-21 LAB — CBC WITH DIFFERENTIAL/PLATELET
Abs Immature Granulocytes: 0.05 10*3/uL (ref 0.00–0.07)
Basophils Absolute: 0 10*3/uL (ref 0.0–0.1)
Basophils Relative: 0 %
Eosinophils Absolute: 0.1 10*3/uL (ref 0.0–0.5)
Eosinophils Relative: 1 %
HCT: 45.6 % (ref 39.0–52.0)
Hemoglobin: 15.4 g/dL (ref 13.0–17.0)
Immature Granulocytes: 1 %
Lymphocytes Relative: 9 %
Lymphs Abs: 0.8 10*3/uL (ref 0.7–4.0)
MCH: 32.8 pg (ref 26.0–34.0)
MCHC: 33.8 g/dL (ref 30.0–36.0)
MCV: 97 fL (ref 80.0–100.0)
Monocytes Absolute: 0.6 10*3/uL (ref 0.1–1.0)
Monocytes Relative: 6 %
Neutro Abs: 7.2 10*3/uL (ref 1.7–7.7)
Neutrophils Relative %: 83 %
Platelets: 143 10*3/uL — ABNORMAL LOW (ref 150–400)
RBC: 4.7 MIL/uL (ref 4.22–5.81)
RDW: 14.5 % (ref 11.5–15.5)
WBC: 8.7 10*3/uL (ref 4.0–10.5)
nRBC: 0 % (ref 0.0–0.2)

## 2020-11-21 LAB — BRAIN NATRIURETIC PEPTIDE: B Natriuretic Peptide: 907.1 pg/mL — ABNORMAL HIGH (ref 0.0–100.0)

## 2020-11-21 LAB — RAPID URINE DRUG SCREEN, HOSP PERFORMED
Amphetamines: NOT DETECTED
Barbiturates: NOT DETECTED
Benzodiazepines: POSITIVE — AB
Cocaine: NOT DETECTED
Opiates: NOT DETECTED
Tetrahydrocannabinol: NOT DETECTED

## 2020-11-21 LAB — I-STAT ARTERIAL BLOOD GAS, ED
Acid-Base Excess: 1 mmol/L (ref 0.0–2.0)
Bicarbonate: 25.5 mmol/L (ref 20.0–28.0)
Calcium, Ion: 1.31 mmol/L (ref 1.15–1.40)
HCT: 42 % (ref 39.0–52.0)
Hemoglobin: 14.3 g/dL (ref 13.0–17.0)
O2 Saturation: 90 %
Potassium: 3.3 mmol/L — ABNORMAL LOW (ref 3.5–5.1)
Sodium: 147 mmol/L — ABNORMAL HIGH (ref 135–145)
TCO2: 27 mmol/L (ref 22–32)
pCO2 arterial: 38.9 mmHg (ref 32.0–48.0)
pH, Arterial: 7.424 (ref 7.350–7.450)
pO2, Arterial: 58 mmHg — ABNORMAL LOW (ref 83.0–108.0)

## 2020-11-21 LAB — CBG MONITORING, ED: Glucose-Capillary: 113 mg/dL — ABNORMAL HIGH (ref 70–99)

## 2020-11-21 LAB — HIV ANTIBODY (ROUTINE TESTING W REFLEX): HIV Screen 4th Generation wRfx: NONREACTIVE

## 2020-11-21 LAB — LACTIC ACID, PLASMA: Lactic Acid, Venous: 1.6 mmol/L (ref 0.5–1.9)

## 2020-11-21 LAB — TSH: TSH: 6.797 u[IU]/mL — ABNORMAL HIGH (ref 0.350–4.500)

## 2020-11-21 LAB — PROCALCITONIN: Procalcitonin: 0.27 ng/mL

## 2020-11-21 LAB — AMMONIA: Ammonia: 30 umol/L (ref 9–35)

## 2020-11-21 MED ORDER — LORAZEPAM 1 MG PO TABS: 1.0000 mg | ORAL_TABLET | ORAL | Status: DC | PRN

## 2020-11-21 MED ORDER — ADULT MULTIVITAMIN W/MINERALS CH: 1.0000 | ORAL_TABLET | Freq: Every day | ORAL | Status: DC

## 2020-11-21 MED ORDER — FUROSEMIDE 10 MG/ML IJ SOLN
40.0000 mg | Freq: Once | INTRAMUSCULAR | Status: AC
  Administered 2020-11-21: 40 mg via INTRAVENOUS
  Filled 2020-11-21: qty 4

## 2020-11-21 MED ORDER — LABETALOL HCL 5 MG/ML IV SOLN
10.0000 mg | Freq: Once | INTRAVENOUS | Status: AC
  Administered 2020-11-21: 10 mg via INTRAVENOUS
  Filled 2020-11-21: qty 4

## 2020-11-21 MED ORDER — SODIUM CHLORIDE 0.9 % IV SOLN
1.0000 g | Freq: Once | INTRAVENOUS | Status: AC
  Administered 2020-11-21: 1 g via INTRAVENOUS
  Filled 2020-11-21: qty 10

## 2020-11-21 MED ORDER — MAGNESIUM SULFATE IN D5W 1-5 GM/100ML-% IV SOLN
1.0000 g | Freq: Once | INTRAVENOUS | Status: DC
  Filled 2020-11-21: qty 100

## 2020-11-21 MED ORDER — HYDRALAZINE HCL 20 MG/ML IJ SOLN
10.0000 mg | INTRAMUSCULAR | Status: DC | PRN
  Administered 2020-11-21: 10 mg via INTRAVENOUS
  Filled 2020-11-21: qty 1

## 2020-11-21 MED ORDER — IPRATROPIUM-ALBUTEROL 0.5-2.5 (3) MG/3ML IN SOLN
3.0000 mL | Freq: Four times a day (QID) | RESPIRATORY_TRACT | Status: DC
  Administered 2020-11-21 – 2020-11-25 (×10): 3 mL via RESPIRATORY_TRACT
  Filled 2020-11-21 (×12): qty 3

## 2020-11-21 MED ORDER — THIAMINE HCL 100 MG/ML IJ SOLN: 100.0000 mg | Freq: Every day | INTRAMUSCULAR | Status: DC

## 2020-11-21 MED ORDER — HALOPERIDOL LACTATE 5 MG/ML IJ SOLN
2.0000 mg | Freq: Four times a day (QID) | INTRAMUSCULAR | Status: DC | PRN
  Administered 2020-11-22 – 2020-11-23 (×3): 2 mg via INTRAVENOUS
  Filled 2020-11-21 (×3): qty 1

## 2020-11-21 MED ORDER — LORAZEPAM 2 MG/ML IJ SOLN
1.0000 mg | INTRAMUSCULAR | Status: DC | PRN
  Administered 2020-11-21: 2 mg via INTRAVENOUS
  Filled 2020-11-21: qty 1

## 2020-11-21 MED ORDER — THIAMINE HCL 100 MG PO TABS
100.0000 mg | ORAL_TABLET | Freq: Every day | ORAL | Status: DC
  Administered 2020-11-22: 100 mg via ORAL
  Filled 2020-11-21: qty 1

## 2020-11-21 MED ORDER — LORAZEPAM 2 MG/ML IJ SOLN: 1.0000 mg | INTRAMUSCULAR | Status: DC | PRN

## 2020-11-21 MED ORDER — SODIUM CHLORIDE 0.9 % IV BOLUS
1000.0000 mL | Freq: Once | INTRAVENOUS | Status: AC
  Administered 2020-11-21: 1000 mL via INTRAVENOUS

## 2020-11-21 MED ORDER — ALBUTEROL SULFATE (2.5 MG/3ML) 0.083% IN NEBU: 5.0000 mg | INHALATION_SOLUTION | Freq: Once | RESPIRATORY_TRACT | Status: DC

## 2020-11-21 MED ORDER — HYDRALAZINE HCL 20 MG/ML IJ SOLN
10.0000 mg | INTRAMUSCULAR | Status: DC | PRN
  Administered 2020-11-21: 10 mg via INTRAVENOUS
  Administered 2020-11-22: 20 mg via INTRAVENOUS
  Filled 2020-11-21 (×2): qty 1

## 2020-11-21 MED ORDER — SODIUM CHLORIDE 0.9 % IV SOLN
500.0000 mg | INTRAVENOUS | Status: DC
  Administered 2020-11-22 – 2020-11-23 (×2): 500 mg via INTRAVENOUS
  Filled 2020-11-21 (×3): qty 500

## 2020-11-21 MED ORDER — FOLIC ACID 1 MG PO TABS
1.0000 mg | ORAL_TABLET | Freq: Every day | ORAL | Status: DC
  Administered 2020-11-22: 1 mg via ORAL
  Filled 2020-11-21: qty 1

## 2020-11-21 MED ORDER — TERAZOSIN HCL 5 MG PO CAPS
10.0000 mg | ORAL_CAPSULE | Freq: Two times a day (BID) | ORAL | Status: DC
  Administered 2020-11-22: 10 mg via ORAL
  Filled 2020-11-21 (×4): qty 2

## 2020-11-21 MED ORDER — THIAMINE HCL 100 MG/ML IJ SOLN
100.0000 mg | Freq: Once | INTRAMUSCULAR | Status: AC
  Administered 2020-11-21: 100 mg via INTRAVENOUS

## 2020-11-21 MED ORDER — LORAZEPAM 0.5 MG PO TABS
0.5000 mg | ORAL_TABLET | ORAL | Status: DC | PRN
  Filled 2020-11-21: qty 1

## 2020-11-21 MED ORDER — SODIUM CHLORIDE 0.9 % IV SOLN
500.0000 mg | Freq: Once | INTRAVENOUS | Status: AC
  Administered 2020-11-21: 500 mg via INTRAVENOUS
  Filled 2020-11-21: qty 500

## 2020-11-21 MED ORDER — SODIUM CHLORIDE 0.9 % IV SOLN
2.0000 g | INTRAVENOUS | Status: DC
  Administered 2020-11-22 – 2020-11-23 (×2): 2 g via INTRAVENOUS
  Filled 2020-11-21: qty 20
  Filled 2020-11-21 (×2): qty 2

## 2020-11-21 MED ORDER — METHYLPREDNISOLONE SODIUM SUCC 125 MG IJ SOLR
60.0000 mg | Freq: Three times a day (TID) | INTRAMUSCULAR | Status: DC
  Administered 2020-11-21 – 2020-11-24 (×9): 60 mg via INTRAVENOUS
  Filled 2020-11-21 (×9): qty 2

## 2020-11-21 NOTE — ED Notes (Signed)
Assisted with putting soft restraints on

## 2020-11-21 NOTE — ED Notes (Signed)
Pt is still agitated, trying to climb over side rails and pulling at all lines and tubes. Dr Tamala Julian aware and order received for soft limb restraints

## 2020-11-21 NOTE — Progress Notes (Addendum)
Patient was noted to have elevated BNP of 907.1 along with elevated TSH of 6.797.  Last EF was noted to be around 60 to 65% with dilated left atrium in May of this year.  Question of possibility of underlying fluid overload component as well as he had not been taking his medications.   Will give a one-time dose of Lasix IV and reassess in a.m. for need of continued diuresis.  Consider alternative means to oral medications if unable to pass swallow study.

## 2020-11-21 NOTE — ED Notes (Signed)
This nurse to give pt his morning medication regimen, Pt altered, pt not able to tolerate PO medication at this time. This nurse to notify ED provider of pt presentation.

## 2020-11-21 NOTE — ED Notes (Addendum)
Pt noted to have full depend;nursing staff Conservator, museum/gallery and NT) to change pt.  Pt increasingly agitated, restless, most words incomprehensible, but some cussing while nursing staff changing pt.

## 2020-11-21 NOTE — ED Notes (Signed)
RT consulted for ABG 

## 2020-11-21 NOTE — ED Notes (Signed)
Patient's daughter called for status update and to find out if she is able to visit; Daughter is from out of state and will call back to verify with charge later this morning if visit will be permitted; RN will encourage next shift to consider visit due to status of patient;-Monique,RN
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
## 2020-11-21 NOTE — ED Notes (Signed)
Please have doctor call daughter Mickel Baas @ [phone]

## 2020-11-21 NOTE — ED Notes (Signed)
Pt trying to crawl over side of bed rail and  pulling at foley. Sitting redirected pt numerous times without success. PRN ativan will be given

## 2020-11-21 NOTE — Telephone Encounter (Signed)
lmtcb for pt.  

## 2020-11-21 NOTE — ED Provider Notes (Signed)
9:25 AM BP (!) 193/94 (BP Location: Left Arm)   Pulse 64   Temp 98.6 F (37 C) (Oral)   Resp 20   Ht (P) 5\' 9"  (1.753 m)   Wt (P) 92.1 kg   SpO2 91%   BMI (P) 29.98 kg/m    I was asked to evaluate the patient today as he seems to have a significant decline in his mental status. In brief, the patient arrived on 11/19/2020 with confusion.,  Behavioral changes and altered mental status which represented an acute change in his behavior.  He has an extensive past medical history including AAA, chronic kidney disease, COPD with oxygen dependent, CAD, hypertension, previous MI, permanent pacemaker in place, previous stroke. Work-up performed 2 days ago was without significant abnormality however I do not see that urinalysis was obtained.  Patient was medically cleared and sent for psych evaluation.  This morning his nurse evaluated him and states that he seemed to be extremely difficult to arouse or get to follow commands.  In reviewing his notes that seem to be a huge change.  On my evaluation, the patient is lying in bed, lethargic. He is tachypneic with prolonged expiratory effort and diffuse bilateral wheezes of the lungs.  The patient opens eyes to voice, localizes to pain and has confused and unintelligible verbal response giving him a GCS of 11. We will reevaluate the patient with repeat labs, imaging, catheterized urine.  He has had multiple doses of Ativan here in the emergency department for agitation and aggressive behavior which could be the cause of his altered mentation.  10:17 AM  BP (!) 199/105 (BP Location: Left Arm)   Pulse 63   Temp 98.5 F (36.9 C) (Rectal)   Resp 16   Ht (P) 5\' 9"  (1.753 m)   Wt (P) 92.1 kg   SpO2 93%   BMI (P) 29.98 kg/m  Clinical Course as of Nov 21 1613  Thu Nov 21, 2020  1014 CXR with multifocal pneumonia    [AH]    Clinical Course User Index [AH] Margarita Mail, PA-C    Patient CXR shows multifocal pneumonia- ABG shows hypoxia without  hypercarbia I have consulted hospitalist for admission Patient given rocephin and azithromycin IV. He is notably hypertensive and I have ordered a dose of labetalol.  I have ordered albuterol for wheezing Patient's 02 readings 91% and 93% on 2L.   I discussed the findings with the patient's daughter  Abdullah Rizzi who is his POA and his girlfriend Ms. Pearsall to update them on the patient's admission. Answered all questions to the best of my ability   Margarita Mail, Hershal Coria 11/21/20 1617    Milton Ferguson, MD 11/24/20 [phone]

## 2020-11-21 NOTE — ED Notes (Signed)
Dr Tamala Julian secured message that pt remains hypertensive and agitated after medications

## 2020-11-22 ENCOUNTER — Inpatient Hospital Stay (HOSPITAL_COMMUNITY): Payer: Medicare Other

## 2020-11-22 DIAGNOSIS — J189 Pneumonia, unspecified organism: Secondary | ICD-10-CM | POA: Diagnosis not present

## 2020-11-22 LAB — T4, FREE: Free T4: 1.39 ng/dL — ABNORMAL HIGH (ref 0.61–1.12)

## 2020-11-22 LAB — PHOSPHORUS: Phosphorus: 2.8 mg/dL (ref 2.5–4.6)

## 2020-11-22 LAB — BASIC METABOLIC PANEL
Anion gap: 15 (ref 5–15)
BUN: 39 mg/dL — ABNORMAL HIGH (ref 8–23)
CO2: 22 mmol/L (ref 22–32)
Calcium: 9.2 mg/dL (ref 8.9–10.3)
Chloride: 115 mmol/L — ABNORMAL HIGH (ref 98–111)
Creatinine, Ser: 1.71 mg/dL — ABNORMAL HIGH (ref 0.61–1.24)
GFR, Estimated: 39 mL/min — ABNORMAL LOW (ref >60)
Glucose, Bld: 148 mg/dL — ABNORMAL HIGH (ref 70–99)
Potassium: 3.4 mmol/L — ABNORMAL LOW (ref 3.5–5.1)
Sodium: 152 mmol/L — ABNORMAL HIGH (ref 135–145)

## 2020-11-22 LAB — MAGNESIUM: Magnesium: 2.2 mg/dL (ref 1.7–2.4)

## 2020-11-22 LAB — VITAMIN B12: Vitamin B-12: 1745 pg/mL — ABNORMAL HIGH (ref 180–914)

## 2020-11-22 MED ORDER — KCL IN DEXTROSE-NACL 20-5-0.45 MEQ/L-%-% IV SOLN
INTRAVENOUS | Status: DC
  Filled 2020-11-22 (×3): qty 1000

## 2020-11-22 MED ORDER — AMLODIPINE BESYLATE 10 MG PO TABS
10.0000 mg | ORAL_TABLET | Freq: Every day | ORAL | Status: DC
  Administered 2020-11-22: 10 mg via ORAL
  Filled 2020-11-22: qty 2

## 2020-11-22 MED ORDER — ENOXAPARIN SODIUM 100 MG/ML ~~LOC~~ SOLN
1.0000 mg/kg | Freq: Two times a day (BID) | SUBCUTANEOUS | Status: DC
  Administered 2020-11-22 – 2020-11-24 (×4): 92.5 mg via SUBCUTANEOUS
  Filled 2020-11-22 (×4): qty 1

## 2020-11-22 MED ORDER — CHLORHEXIDINE GLUCONATE CLOTH 2 % EX PADS
6.0000 | MEDICATED_PAD | Freq: Every day | CUTANEOUS | Status: DC
  Administered 2020-11-22 – 2020-11-24 (×3): 6 via TOPICAL

## 2020-11-22 MED ORDER — LEVOTHYROXINE SODIUM 100 MCG/5ML IV SOLN: 44.0000 ug | Freq: Every day | INTRAVENOUS | Status: DC
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
## 2020-11-22 NOTE — Progress Notes (Signed)
Pt confused, screaming, and thrashing around in scanner. RN called to administer meds but pt still was too uncooperative to get even scout imaging.

## 2020-11-22 NOTE — Progress Notes (Signed)
Per order, changed device settings for MRI to  VOO at 100 bpm  Will program device back to pre-MRI settings after completion of exam and send transmission.

## 2020-11-22 NOTE — Plan of Care (Signed)
  Problem: Safety: Goal: Non-violent Restraint(s) Outcome: Progressing   

## 2020-11-22 NOTE — Progress Notes (Signed)
Patient was unable to tolerate scan even with bedside nurse coming down to medicate patient. Patient would not stay still and was yelling during both attempts to scan patient.    Pacer returned back to previous settings and post transmission sent to provider.
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
## 2020-11-23 ENCOUNTER — Inpatient Hospital Stay (HOSPITAL_COMMUNITY): Payer: Medicare Other

## 2020-11-23 DIAGNOSIS — J189 Pneumonia, unspecified organism: Secondary | ICD-10-CM | POA: Diagnosis not present

## 2020-11-23 LAB — BASIC METABOLIC PANEL
Anion gap: 13 (ref 5–15)
BUN: 56 mg/dL — ABNORMAL HIGH (ref 8–23)
CO2: 22 mmol/L (ref 22–32)
Calcium: 9 mg/dL (ref 8.9–10.3)
Chloride: 115 mmol/L — ABNORMAL HIGH (ref 98–111)
Creatinine, Ser: 2.15 mg/dL — ABNORMAL HIGH (ref 0.61–1.24)
GFR, Estimated: 30 mL/min — ABNORMAL LOW (ref >60)
Glucose, Bld: 198 mg/dL — ABNORMAL HIGH (ref 70–99)
Potassium: 3.3 mmol/L — ABNORMAL LOW (ref 3.5–5.1)
Sodium: 150 mmol/L — ABNORMAL HIGH (ref 135–145)

## 2020-11-23 MED ORDER — POTASSIUM CL IN DEXTROSE 5% 20 MEQ/L IV SOLN
20.0000 meq | INTRAVENOUS | Status: DC
  Administered 2020-11-23 – 2020-11-24 (×3): 20 meq via INTRAVENOUS
  Filled 2020-11-23 (×3): qty 1000

## 2020-11-23 NOTE — Progress Notes (Signed)
Pt confused and agitated pulling on medical devices. PRN Haldol given without improvement. Will continue to monitor pt.

## 2020-11-23 NOTE — Progress Notes (Signed)
  Speech Language Pathology Treatment: Dysphagia  Patient Details Name: Derek [name] MRN: [id number] DOB: [birth year] Today's Date: 11/23/2020 Time: 2876-8115 SLP Time Calculation (min) (ACUTE ONLY): 15 min  Assessment / Plan / Recommendation Clinical Impression  Pt provided oral care prior to PO intake with mod multimodal cues given during consumption of ice chips and tsp/cup sips of thin liquids with delayed throat clearing/cough and wet vocal quality noted after liquids. A mild delay in the initiation of the swallow, oral holding and distractibility (ie: talking during consumption, swishing liquids in mouth prior to swallow) suggest cognitive-based impairment related to swallow.  Pt only oriented to self during treatment session and required moderate cueing throughout to continue PO intake.  Lethargy a factor as well during consumption.  Pt continues to be at a moderate-severe risk for aspiration at this time.  Recommend NPO with ice chips after oral care provided; ST will continue to monitor for PO readiness/objective testing when appropriate.    HPI HPI:  Derek [name] is a 85 y.o. male with medical history significant of hypertension, COPD on 2 L of oxygen, chronic kidney disease stage III/IV, CVA/TIA, atrial fibrillation, CAD/MI, and hypothyroidism who presented after being noted to be acutely altered by his girlfriend.  TIA in May, 2021 and noted to have 70% right ICA stenosis. Found to have pna. Per chart pt has been very combative, agitated. CT negative. CXR Foci of airspace opacity in the right upper lobe and left base as well as to a lesser extent in the right base. Appearance indicative of multifocal pneumonia.      SLP Plan  Continue with current plan of care       Recommendations  Diet recommendations: NPO;Other(comment) (ice chips okay after oral care) Medication Administration: Via alternative means                Oral Care Recommendations: Oral care QID;Oral care prior  to ice chip/H20;Staff/trained caregiver to provide oral care Follow up Recommendations: Other (comment) (TBD) SLP Visit Diagnosis: Dysphagia, unspecified (R13.10) Plan: Continue with current plan of care                       Elvina Sidle, M.S., CCC-SLP 11/23/2020, 12:48 PM
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
## 2020-11-23 NOTE — Plan of Care (Signed)
°  Problem: Elimination: Goal: Will not experience complications related to urinary retention Outcome: Progressing   Problem: Pain Managment: Goal: General experience of comfort will improve Outcome: Progressing   Problem: Skin Integrity: Goal: Risk for impaired skin integrity will decrease Outcome: Progressing   Problem: Activity: Goal: Ability to tolerate increased activity will improve Outcome: Progressing   Problem: Clinical Measurements: Goal: Ability to maintain a body temperature in the normal range will improve Outcome: Progressing   Problem: Respiratory: Goal: Ability to maintain adequate ventilation will improve Outcome: Progressing Goal: Ability to maintain a clear airway will improve Outcome: Progressing

## 2020-11-23 NOTE — Plan of Care (Signed)
  Problem: Education: Goal: Knowledge of General Education information will improve Description: Including pain rating scale, medication(s)/side effects and non-pharmacologic comfort measures Outcome: Progressing   Problem: Health Behavior/Discharge Planning: Goal: Ability to manage health-related needs will improve Outcome: Progressing   Problem: Clinical Measurements: Goal: Ability to maintain clinical measurements within normal limits will improve Outcome: Progressing Goal: Will remain free from infection Outcome: Progressing Goal: Diagnostic test results will improve Outcome: Progressing Goal: Respiratory complications will improve Outcome: Progressing Goal: Cardiovascular complication will be avoided Outcome: Progressing   Problem: Activity: Goal: Risk for activity intolerance will decrease Outcome: Progressing   Problem: Nutrition: Goal: Adequate nutrition will be maintained Outcome: Progressing   Problem: Coping: Goal: Level of anxiety will decrease Outcome: Progressing   Problem: Elimination: Goal: Will not experience complications related to bowel motility Outcome: Progressing Goal: Will not experience complications related to urinary retention Outcome: Progressing   Problem: Pain Managment: Goal: General experience of comfort will improve Outcome: Progressing   Problem: Safety: Goal: Ability to remain free from injury will improve Outcome: Progressing   Problem: Skin Integrity: Goal: Risk for impaired skin integrity will decrease Outcome: Progressing   Problem: Activity: Goal: Ability to tolerate increased activity will improve Outcome: Progressing   Problem: Clinical Measurements: Goal: Ability to maintain a body temperature in the normal range will improve Outcome: Progressing   Problem: Respiratory: Goal: Ability to maintain adequate ventilation will improve Outcome: Progressing Goal: Ability to maintain a clear airway will improve Outcome:  Progressing   Problem: Safety: Goal: Non-violent Restraint(s) Outcome: Progressing

## 2020-11-23 NOTE — Progress Notes (Signed)
Pt family stated that when he was at home pt was depressed and pt stated that he was not going to take any medications anymore, nor use oxygen and would do what he pleased. Pt family do believe he was trying to intentionally expire. Communication to MD and Nurse to make aware.

## 2020-11-23 NOTE — Progress Notes (Signed)
Daughter called and made aware of wrist restraints

## 2020-11-23 NOTE — Progress Notes (Signed)
MD states will f/u with family. MD and nurse also made aware pt last bm documented 12/1. Nurse stated no BM today from pt.

## 2020-11-24 DIAGNOSIS — J189 Pneumonia, unspecified organism: Secondary | ICD-10-CM | POA: Diagnosis not present

## 2020-11-24 DIAGNOSIS — Z7189 Other specified counseling: Secondary | ICD-10-CM

## 2020-11-24 DIAGNOSIS — Z515 Encounter for palliative care: Secondary | ICD-10-CM

## 2020-11-24 DIAGNOSIS — Z66 Do not resuscitate: Secondary | ICD-10-CM | POA: Diagnosis not present

## 2020-11-24 LAB — BASIC METABOLIC PANEL
Anion gap: 12 (ref 5–15)
BUN: 63 mg/dL — ABNORMAL HIGH (ref 8–23)
CO2: 21 mmol/L — ABNORMAL LOW (ref 22–32)
Calcium: 9 mg/dL (ref 8.9–10.3)
Chloride: 115 mmol/L — ABNORMAL HIGH (ref 98–111)
Creatinine, Ser: 2.43 mg/dL — ABNORMAL HIGH (ref 0.61–1.24)
GFR, Estimated: 26 mL/min — ABNORMAL LOW (ref >60)
Glucose, Bld: 184 mg/dL — ABNORMAL HIGH (ref 70–99)
Potassium: 3.6 mmol/L (ref 3.5–5.1)
Sodium: 148 mmol/L — ABNORMAL HIGH (ref 135–145)

## 2020-11-24 MED ORDER — ACETAMINOPHEN 650 MG RE SUPP: 650.0000 mg | Freq: Four times a day (QID) | RECTAL | Status: DC | PRN

## 2020-11-24 MED ORDER — BISACODYL 10 MG RE SUPP: 10.0000 mg | Freq: Every day | RECTAL | Status: DC | PRN

## 2020-11-24 MED ORDER — BIOTENE DRY MOUTH MT LIQD: 15.0000 mL | OROMUCOSAL | Status: DC | PRN

## 2020-11-24 MED ORDER — HYDROMORPHONE HCL 1 MG/ML IJ SOLN
0.5000 mg | INTRAMUSCULAR | Status: DC | PRN
  Administered 2020-11-24: 0.5 mg via INTRAVENOUS
  Administered 2020-11-24 – 2020-11-25 (×3): 1 mg via INTRAVENOUS
  Filled 2020-11-24 (×4): qty 1

## 2020-11-24 MED ORDER — WHITE PETROLATUM EX OINT: TOPICAL_OINTMENT | CUTANEOUS | Status: DC | PRN

## 2020-11-24 MED ORDER — ONDANSETRON HCL 4 MG/2ML IJ SOLN: 4.0000 mg | Freq: Four times a day (QID) | INTRAMUSCULAR | Status: DC | PRN

## 2020-11-24 MED ORDER — ACETAMINOPHEN 325 MG PO TABS: 650.0000 mg | ORAL_TABLET | Freq: Four times a day (QID) | ORAL | Status: DC | PRN

## 2020-11-24 MED ORDER — GLYCOPYRROLATE 0.2 MG/ML IJ SOLN
0.4000 mg | INTRAMUSCULAR | Status: DC | PRN
  Administered 2020-11-24 – 2020-11-25 (×2): 0.4 mg via INTRAVENOUS
  Filled 2020-11-24 (×2): qty 2

## 2020-11-24 MED ORDER — LORAZEPAM 2 MG/ML IJ SOLN
0.5000 mg | INTRAMUSCULAR | Status: DC | PRN
  Administered 2020-11-25: 1 mg via INTRAVENOUS
  Filled 2020-11-24: qty 1

## 2020-11-24 NOTE — Consult Note (Signed)
Responded to nurse's page, visited after family members arrived from [place]-of-state just after pt had been assigned to hospice. Daughter from [place], son from [place], and pt's lady friend were bedside. The son described pt as agnostic, but said he was a child of God, and daughter requested prayer. They were appreciative of prayer and prayer shawl brought for pt, which the 2 women stretched out across pt b/t them on opposite sides of the bed. They all believed in the power of prayer. I let them know they could ask a nurse to call a chaplain at any time there was further need of chaplain services.   Rev. Eloise Levels Chaplain

## 2020-11-24 NOTE — Consult Note (Signed)
Palliative Medicine Inpatient Consult Note  Reason for consult:  Goals of Care "Patient initially rec for geri-psych but continues to decline due to multifocal pneumonia, AKI, hypernatremia, delirium/encephalopathy, NPO due to unsafe swallow"  HPI:  Per intake H&P --> Derek Derek [name] is an 85 y.o.malewith medical history significant ofhypertension, COPDon 2 L of oxygen, chronic Derek [name] diseasestage III/IV,CVA/TIA,persistent atrial fibrillation on chronic anticoagulation,CAD/MI, andhypothyroidismwho presented after being noted to be acutely altered by his girlfriend.   Palliative care was asked to aid in goals of care conversations in the setting of multiple comorbid conditions and significant mental and physical declines since October.  Clinical Assessment/Goals of Care: I have reviewed medical records including EPIC notes, labs and imaging, received report from bedside RN, assessed the patient who was pleasant and somewhat disoriented.    I called patient's daughter Derek Derek [name] and her son Derek Derek [name] to further discuss diagnosis prognosis, Derek Derek [name], EOL wishes, disposition and options.   I introduced Palliative Medicine as specialized medical care for people living with serious illness. It focuses on providing relief from the symptoms and stress of a serious illness. The goal is to improve quality of life for both the patient and the family.  Derek Derek [name] shares with me that Derek Derek [name] is from [place] originally.  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  He then worked for the [employer] for a 7-year period of time before finally retiring.  He is a widower his former wife died in 6.  They share 2 children Derek Derek [name] weighing.  He is a strong man who has always had a great  interest in learning.  He got quite involved in genealogy when he retired and moved to [place].  He presently does have a partner by the name of Derek Derek [name] personal who he has had a relationship with for a number of years.  He is a religious man though is nondenominational.  Derek Derek [name] shares that since May when he suffered his third known stroke he has had some very significant cognitive declines.  Since October of this year after suffering a fall his declines were even more marked and significant.  He has also suffered great loss in the last few months losing both 2 step daughters and 1 stepgranddaughter.  His psychiatric state has been tenuous and he has had episodes of outburst.  Derek Derek [name] and I talked about Derek Derek [name] present health state and his lack of adherence to medication compliance.  We discussed that it seems as if Derek [name] realizes his final time is near and he no longer wishes to engage in aggressive medical efforts.  We discussed in the greater scheme of things the quality of life Derek Derek [name] is living.  Derek Derek [name] shares that he has not had a great amount of quality in his life for some time now which is exceptionally hard for she and her family to grasp.  Given that Derek Derek [name] is known to be a very strong independent man Derek Derek [name] and her brother Derek Derek [name] want to value what is important to him at this juncture of his life.  Derek Derek [name] shares that in Derek [name]'s wishes he did not want heroic or extraneous measures to pursue living.  He is a DO NOT RESUSCITATE DO NOT INTUBATE CODE STATUS.  We talked  about hospice care and how this is preserved for patients who have been estimated diagnosis of 6 months or less to live.  We discussed that the focus transitions to that of symptom relief and preservation of dignity as we encroach upon the end phases of her life.  At this point in time Derek [name] would not be safe to go home as there is not 24/7 caregiving arranged.  We discussed inpatient hospice is being a good alternative given Derek Derek [name] decline in  his goals being to focus on comfort.  Derek Derek [name] being a former veteran would be appropriate to go to the [place] inpatient hospice.  If a bed is not available there we will pursue other inpatient hospice homes so that we can best observe Derek Derek [name]'s wishes.  After the conversation with Derek Derek [name] and Derek Derek [name] was had, I did speak to Derek [name] about what we had talked about.  Derek [name] does share with me that he is "tired" and what he could say is that he was in agreement with the plan to transition to hospice.  He said he has two wishes the first being that he has the opportunity to watch football as this is his favorite sport and the second that he gets what ever type of ice cream he wants.  Transitions of care team has been communicated with to aid in getting Derek Derek [name] to inpatient hospice.  Decision Maker: Derek Derek [name] [phone]  SUMMARY OF RECOMMENDATIONS   DNAR/DNI  Comfort care  TOC - IP Hospice   Spirtual support  Code Status/Advance Care Planning: DNAR/DNI   Symptom Management:  Dyspnea: Pain:  - Dilaudid 0.5-1mg  IV Q1H PRN Fever:  - Tylenol 650mg  PO/PR Q6H PRN Agitation:  - Haldol 2mg  IV Q6H PRN Anxiety:  - Lorazepam 0.5-1mg  IIV  Q1H PRN Nausea:  - Zofran 4mg  IV Q6H PRN  Secretions:  - Glycopyrrolate 0.4mg  IV Q4H PRN Dry Eyes:  - Artificial Tears PRN Xerostomia:  - Biotene 52ml PRN  - BID oral care Urinary Retention:  - Bladder scan if > 271ml place and maintain foley  Constipation:  - Bisacodyl 10mg  PR PRN QDay Spiritual:  - Chaplain consult   Palliative Prophylaxis:   Oral Care, Pain, Constipation  Additional Recommendations (Limitations, Scope, Preferences):  Comfort care   Psycho-social/Spiritual:   Desire for further Chaplaincy support: Yes   Additional Recommendations: Education on end of life process   Prognosis: Poor in the setting of decreased PO's - likely days to weeks  Discharge Planning: Discharge to inpatient hospice  Vitals:    11/23/20 2057 11/23/20 2126  BP:    Pulse:    Resp:    Temp:    SpO2: 95% 91%    Intake/Output Summary (Last 24 hours) at 11/24/2020 1012 Last data filed at 11/24/2020 2505 Gross per 24 hour  Intake 350 ml  Output 1000 ml  Net -650 ml   Last Weight  Most recent update: 11/22/2020 12:51 PM   Weight  92.1 kg (203 lb 0.7 oz)           Gen:  Elderly M  HEENT: moist mucous membranes CV: Regular rate and rhythm  PULM: 3LPM [place] ABD: soft/nontender  EXT: No edema  Neuro: Confused about place and time  PPS: 20%   This conversation/these recommendations were discussed with patient primary care team, Dr. Maylene Roes  Time In: 0920 Time Out: 1030 Total Time: 70 Greater than 50%  of this time was spent counseling and coordinating care related to the above assessment  and plan.  [facility] Team Team Cell Phone: [phone] Please utilize secure chat with additional questions, if there is no response within 30 minutes please call the above phone number  Palliative Medicine Team providers are available by phone from 7am to 7pm daily and can be reached through the team cell phone.  Should this patient require assistance outside of these hours, please call the patient's attending physician.

## 2020-11-24 NOTE — TOC Progression Note (Addendum)
Transition of Care [facility]) - Progression Note    Patient Details  Name: ROLLY MAGRI MRN: [id number] Date of Birth: 04-14-1935  Transition of Care [facility]) CM/SW Contact  Coralee Pesa, [place] Phone Number: 11/24/2020, 1:15 PM  Clinical Narrative:     CSW consulted to discuss Inpatient Hospice with family. Family would like to weigh their options, Anderson Malta with [facility] consulted, as well as Juliann Pulse with [facility]. Monday CSW will need to contact [name] as well as family would like to explore that option as well. Could not contact on the weekend. SW will continue to follow for DC       Expected Discharge Plan and Services                                                 Social Determinants of Health (SDOH) Interventions    Readmission Risk Interventions No flowsheet data found.

## 2020-11-24 NOTE — Progress Notes (Signed)
[facility])  Referral received for residential hospice.  [facility] does not have any beds to offer today.  Family would like to speak with several agencies for further information.  Will follow up with family to provide support.  Venia Carbon RN, BSN, [facility] Liaison
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
## 2020-11-25 DIAGNOSIS — E870 Hyperosmolality and hypernatremia: Secondary | ICD-10-CM

## 2020-11-25 DIAGNOSIS — Z8673 Personal history of transient ischemic attack (TIA), and cerebral infarction without residual deficits: Secondary | ICD-10-CM

## 2020-11-25 DIAGNOSIS — F0150 Vascular dementia without behavioral disturbance: Secondary | ICD-10-CM

## 2020-11-25 DIAGNOSIS — Z7189 Other specified counseling: Secondary | ICD-10-CM

## 2020-11-25 DIAGNOSIS — E876 Hypokalemia: Secondary | ICD-10-CM

## 2020-11-25 DIAGNOSIS — I160 Hypertensive urgency: Secondary | ICD-10-CM

## 2020-11-25 DIAGNOSIS — N179 Acute kidney failure, unspecified: Secondary | ICD-10-CM

## 2020-11-25 DIAGNOSIS — J189 Pneumonia, unspecified organism: Secondary | ICD-10-CM | POA: Diagnosis not present

## 2020-11-25 DIAGNOSIS — R1310 Dysphagia, unspecified: Secondary | ICD-10-CM

## 2020-11-25 DIAGNOSIS — R410 Disorientation, unspecified: Secondary | ICD-10-CM

## 2020-11-25 MED ORDER — IPRATROPIUM-ALBUTEROL 0.5-2.5 (3) MG/3ML IN SOLN: 3.0000 mL | Freq: Four times a day (QID) | RESPIRATORY_TRACT | Status: DC | PRN

## 2020-11-25 NOTE — Plan of Care (Signed)
  Problem: Education: Goal: Knowledge of General Education information will improve Description: Including pain rating scale, medication(s)/side effects and non-pharmacologic comfort measures Outcome: Not Progressing   Problem: Health Behavior/Discharge Planning: Goal: Ability to manage health-related needs will improve Outcome: Not Progressing   Problem: Clinical Measurements: Goal: Ability to maintain clinical measurements within normal limits will improve Outcome: Not Progressing Goal: Will remain free from infection Outcome: Not Progressing Goal: Diagnostic test results will improve Outcome: Not Progressing Goal: Respiratory complications will improve Outcome: Not Progressing Goal: Cardiovascular complication will be avoided Outcome: Not Progressing   Problem: Activity: Goal: Risk for activity intolerance will decrease Outcome: Not Progressing   Problem: Nutrition: Goal: Adequate nutrition will be maintained Outcome: Not Progressing   Problem: Coping: Goal: Level of anxiety will decrease Outcome: Not Progressing   Problem: Elimination: Goal: Will not experience complications related to bowel motility Outcome: Not Progressing Goal: Will not experience complications related to urinary retention Outcome: Not Progressing   Problem: Pain Managment: Goal: General experience of comfort will improve Outcome: Not Progressing   Problem: Safety: Goal: Ability to remain free from injury will improve Outcome: Not Progressing   Problem: Skin Integrity: Goal: Risk for impaired skin integrity will decrease Outcome: Not Progressing   Problem: Activity: Goal: Ability to tolerate increased activity will improve Outcome: Not Progressing   Problem: Clinical Measurements: Goal: Ability to maintain a body temperature in the normal range will improve Outcome: Not Progressing   Problem: Respiratory: Goal: Ability to maintain adequate ventilation will improve Outcome: Not  Progressing Goal: Ability to maintain a clear airway will improve Outcome: Not Progressing   Problem: Safety: Goal: Non-violent Restraint(s) Outcome: Not Progressing

## 2020-11-25 NOTE — Care Management Important Message (Signed)
Important Message  Patient Details  Name: Derek [name] MRN: [id number] Date of Birth: 06-20-1935   Medicare Important Message Given:  Yes   Due to staffing Patient room called to advise of the IM,  did not get an answer IM document mailed to the patient home address  Orbie Pyo 11/25/2020, 2:54 PM

## 2020-11-25 NOTE — Progress Notes (Signed)
   11/25/20 1243  Clinical Encounter Type  Visited With Patient  Visit Type Spiritual support  Referral From Nurse  Consult/Referral To Chaplain  Chaplain responded to consult. Chaplain provided ministry of presence and prayer.This note was prepared by Jeanine Luz, M.Div..  For questions please contact by phone [phone].

## 2020-11-25 NOTE — TOC Progression Note (Signed)
Transition of Care [facility]) - Progression Note    Patient Details  Name: Derek [name] MRN: [id number] Date of Birth: October 21, 1935  Transition of Care [facility]) CM/SW Contact  Joanne Chars, LCSW Phone Number: 11/25/2020, 9:37 AM  Clinical Narrative:  CSW spoke with [facility].  They will have bed at [facility] facility today, just waiting on MD approval.  She will contact family once she has approval.           Expected Discharge Plan and Services                                                 Social Determinants of Health (SDOH) Interventions    Readmission Risk Interventions No flowsheet data found.

## 2020-11-25 NOTE — Progress Notes (Signed)
Nutrition Brief Note  Chart reviewed. Pt now transitioning to comfort care.  No further nutrition interventions warranted at this time.  Please re-consult as needed.   Philomina Leon, MS, RD, LDN RD pager number and weekend/on-call pager number located in Amion.    
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
## 2020-11-25 NOTE — TOC Transition Note (Signed)
Transition of Care [facility]) - CM/SW Discharge Note   Patient Details  Name: Derek [name] MRN: [id number] Date of Birth: 03-07-1935  [facility] CM/SW Contact:  Joanne Chars, LCSW Phone Number: 11/25/2020, 2:00 PM   Clinical Narrative:   Patient discharging to [facility].  Cherie at [facility] confirms admission.  CSW spoke with pt son Derek [name] who also confirms this is the family choice.    Final next level of care: [place] Barriers to Discharge: No Barriers Identified   Patient Goals and CMS Choice        Discharge Placement              Patient chooses bed at:  [facility]) Patient to be transferred to facility by: [place] Name of family member notified: Derek [name], son Patient and family notified of of transfer: 11/25/20  Discharge Plan and Services                                     Social Determinants of Health (SDOH) Interventions     Readmission Risk Interventions No flowsheet data found.

## 2020-11-26 ENCOUNTER — Telehealth: Payer: Self-pay

## 2020-11-26 NOTE — Telephone Encounter (Signed)
Lmtcb for pt.  

## 2020-11-26 NOTE — Telephone Encounter (Signed)
The pt daughter Mickel Baas [facility]) states patient is in hospice care and is actively dying. He will not be connecting to his monitor anymore. She would like for me to let Dr. Tamala Julian and Dr. Caryl Comes know.  I did cancel all the patient upcoming remotes per Mickel Baas request. She wanted to thank everyone for the care they gave her father. I told her I will send a phone note to let them know. If you have any questions you can reach out to the daughter Mickel Baas phone number is [phone].

## 2020-11-27 NOTE — Telephone Encounter (Signed)
Tried to call and speak to daughter and without an answer Sk

## 2020-11-29 NOTE — Telephone Encounter (Signed)
   Derek [name] called again, she said pt passed away on 2023/12/19 and would like to thank Dr. Caryl Comes and Dr. Tamala Julian for all the things they've done for the pt.

## 2020-11-29 NOTE — Telephone Encounter (Signed)
Thanks for the update with Derek [name] killed

## 2020-12-02 NOTE — Telephone Encounter (Signed)
Per cardiology note in pt's chart, the pt passed away on 12.8.21.   Will forward to Dr. Melvyn Novas as Juluis Rainier.

## 2020-12-03 ENCOUNTER — Other Ambulatory Visit (HOSPITAL_COMMUNITY): Payer: Self-pay | Admitting: Interventional Radiology

## 2020-12-03 DIAGNOSIS — I639 Cerebral infarction, unspecified: Secondary | ICD-10-CM

## 2020-12-21 DEATH — deceased

## 2021-07-08 ENCOUNTER — Encounter: Payer: Self-pay | Admitting: Internal Medicine

## 2021-07-08 ENCOUNTER — Telehealth: Payer: Self-pay | Admitting: Internal Medicine

## 2021-07-10 NOTE — Telephone Encounter (Signed)
Nothing noted in message. Will close encounter.  

## 2021-07-25 ENCOUNTER — Ambulatory Visit: Payer: Medicare Other | Admitting: Internal Medicine
